# Patient Record
Sex: Female | Born: 1950 | Race: Black or African American | Hispanic: No | Marital: Single | Smoking: Never smoker
Health system: Southern US, Community
[De-identification: ages and names within clinical notes are randomized; demographics above are authoritative.]

## PROBLEM LIST (undated history)

## (undated) DIAGNOSIS — J069 Acute upper respiratory infection, unspecified: Secondary | ICD-10-CM

## (undated) DIAGNOSIS — G629 Polyneuropathy, unspecified: Secondary | ICD-10-CM

## (undated) DIAGNOSIS — I10 Essential (primary) hypertension: Secondary | ICD-10-CM

## (undated) DIAGNOSIS — M76829 Posterior tibial tendinitis, unspecified leg: Secondary | ICD-10-CM

## (undated) DIAGNOSIS — Z889 Allergy status to unspecified drugs, medicaments and biological substances status: Secondary | ICD-10-CM

## (undated) DIAGNOSIS — D179 Benign lipomatous neoplasm, unspecified: Secondary | ICD-10-CM

## (undated) DIAGNOSIS — R011 Cardiac murmur, unspecified: Secondary | ICD-10-CM

## (undated) DIAGNOSIS — E669 Obesity, unspecified: Secondary | ICD-10-CM

## (undated) DIAGNOSIS — Z0000 Encounter for general adult medical examination without abnormal findings: Secondary | ICD-10-CM

## (undated) DIAGNOSIS — K5289 Other specified noninfective gastroenteritis and colitis: Secondary | ICD-10-CM

## (undated) DIAGNOSIS — E11319 Type 2 diabetes mellitus with unspecified diabetic retinopathy without macular edema: Secondary | ICD-10-CM

## (undated) DIAGNOSIS — Z13220 Encounter for screening for lipoid disorders: Secondary | ICD-10-CM

## (undated) DIAGNOSIS — M1990 Unspecified osteoarthritis, unspecified site: Secondary | ICD-10-CM

## (undated) DIAGNOSIS — H409 Unspecified glaucoma: Secondary | ICD-10-CM

## (undated) DIAGNOSIS — Z13820 Encounter for screening for osteoporosis: Secondary | ICD-10-CM

## (undated) DIAGNOSIS — R1032 Left lower quadrant pain: Secondary | ICD-10-CM

## (undated) DIAGNOSIS — E119 Type 2 diabetes mellitus without complications: Secondary | ICD-10-CM

## (undated) DIAGNOSIS — H3530 Unspecified macular degeneration: Secondary | ICD-10-CM

## (undated) DIAGNOSIS — I5189 Other ill-defined heart diseases: Secondary | ICD-10-CM

## (undated) DIAGNOSIS — D126 Benign neoplasm of colon, unspecified: Secondary | ICD-10-CM

## (undated) DIAGNOSIS — Z1231 Encounter for screening mammogram for malignant neoplasm of breast: Secondary | ICD-10-CM

## (undated) HISTORY — DX: Acute upper respiratory infection, unspecified: J06.9

## (undated) HISTORY — DX: Other specified noninfective gastroenteritis and colitis: K52.89

## (undated) HISTORY — DX: Encounter for screening for osteoporosis: Z13.820

## (undated) HISTORY — DX: Left lower quadrant pain: R10.32

## (undated) HISTORY — DX: Encounter for screening for lipoid disorders: Z13.220

## (undated) HISTORY — DX: Cardiac murmur, unspecified: R01.1

## (undated) HISTORY — PX: TOE SURGERY: SHX1073

## (undated) HISTORY — DX: Essential (primary) hypertension: I10

## (undated) HISTORY — DX: Encounter for general adult medical examination without abnormal findings: Z00.00

## (undated) HISTORY — DX: Type 2 diabetes mellitus without complications: E11.9

## (undated) HISTORY — DX: Obesity, unspecified: E66.9

## (undated) HISTORY — DX: Benign lipomatous neoplasm, unspecified: D17.9

## (undated) HISTORY — DX: Posterior tibial tendinitis, unspecified leg: M76.829

## (undated) HISTORY — DX: Encounter for screening mammogram for malignant neoplasm of breast: Z12.31

## (undated) HISTORY — PX: EYE SURGERY: SHX253

---

## 1981-01-30 HISTORY — PX: TOTAL ABDOMINAL HYSTERECTOMY: SHX209

## 2004-01-31 LAB — HM COLONOSCOPY: HM Colonoscopy: NORMAL

## 2004-04-01 ENCOUNTER — Ambulatory Visit: Payer: Self-pay | Admitting: Gastroenterology

## 2004-04-01 HISTORY — PX: COLONOSCOPY: SHX174

## 2005-01-30 ENCOUNTER — Encounter: Payer: Self-pay | Admitting: Family Medicine

## 2005-01-30 LAB — CONVERTED CEMR LAB: Pap Smear: NORMAL

## 2005-03-01 ENCOUNTER — Other Ambulatory Visit: Admission: RE | Admit: 2005-03-01 | Discharge: 2005-03-01 | Payer: Self-pay | Admitting: Internal Medicine

## 2005-04-06 ENCOUNTER — Ambulatory Visit: Payer: Self-pay | Admitting: Internal Medicine

## 2005-04-24 ENCOUNTER — Ambulatory Visit: Payer: Self-pay | Admitting: Internal Medicine

## 2005-06-05 ENCOUNTER — Emergency Department: Payer: Self-pay | Admitting: Emergency Medicine

## 2006-04-23 ENCOUNTER — Ambulatory Visit: Payer: Self-pay | Admitting: Family Medicine

## 2006-04-23 LAB — CONVERTED CEMR LAB
ALT: 14 units/L (ref 0–40)
AST: 17 units/L (ref 0–37)
Alkaline Phosphatase: 69 units/L (ref 39–117)
BUN: 15 mg/dL (ref 6–23)
CO2: 27 meq/L (ref 19–32)
Calcium: 8.8 mg/dL (ref 8.4–10.5)
Chloride: 109 meq/L (ref 96–112)
Creatinine, Ser: 0.6 mg/dL (ref 0.4–1.2)
GFR calc Af Amer: 133 mL/min
Total Bilirubin: 0.7 mg/dL (ref 0.3–1.2)
Total Protein: 7.1 g/dL (ref 6.0–8.3)

## 2006-05-02 ENCOUNTER — Ambulatory Visit: Payer: Self-pay | Admitting: Family Medicine

## 2006-05-29 ENCOUNTER — Encounter: Payer: Self-pay | Admitting: Family Medicine

## 2006-05-29 ENCOUNTER — Ambulatory Visit: Payer: Self-pay | Admitting: Family Medicine

## 2006-05-29 DIAGNOSIS — Z6831 Body mass index (BMI) 31.0-31.9, adult: Secondary | ICD-10-CM | POA: Insufficient documentation

## 2006-05-29 DIAGNOSIS — I152 Hypertension secondary to endocrine disorders: Secondary | ICD-10-CM | POA: Insufficient documentation

## 2006-05-29 DIAGNOSIS — E1159 Type 2 diabetes mellitus with other circulatory complications: Secondary | ICD-10-CM | POA: Insufficient documentation

## 2006-05-29 DIAGNOSIS — E663 Overweight: Secondary | ICD-10-CM | POA: Insufficient documentation

## 2006-05-29 DIAGNOSIS — I10 Essential (primary) hypertension: Secondary | ICD-10-CM | POA: Insufficient documentation

## 2006-05-29 DIAGNOSIS — Z6832 Body mass index (BMI) 32.0-32.9, adult: Secondary | ICD-10-CM

## 2006-06-26 ENCOUNTER — Ambulatory Visit: Payer: Self-pay | Admitting: Family Medicine

## 2006-08-15 ENCOUNTER — Ambulatory Visit: Payer: Self-pay | Admitting: Family Medicine

## 2006-08-15 ENCOUNTER — Encounter: Payer: Self-pay | Admitting: Family Medicine

## 2006-12-25 ENCOUNTER — Ambulatory Visit: Payer: Self-pay | Admitting: Family Medicine

## 2006-12-25 LAB — CONVERTED CEMR LAB
Blood in Urine, dipstick: NEGATIVE
Nitrite: NEGATIVE
Specific Gravity, Urine: 1.025

## 2006-12-26 LAB — CONVERTED CEMR LAB
Basophils Absolute: 0 10*3/uL (ref 0.0–0.1)
Basophils Relative: 0.1 % (ref 0.0–1.0)
HCT: 42.5 % (ref 36.0–46.0)
Hemoglobin: 14.7 g/dL (ref 12.0–15.0)
MCHC: 34.5 g/dL (ref 30.0–36.0)
Monocytes Absolute: 0.5 10*3/uL (ref 0.2–0.7)
Neutrophils Relative %: 59.6 % (ref 43.0–77.0)
RDW: 11.9 % (ref 11.5–14.6)

## 2007-03-12 ENCOUNTER — Ambulatory Visit: Payer: Self-pay | Admitting: Family Medicine

## 2007-07-10 ENCOUNTER — Ambulatory Visit: Payer: Self-pay | Admitting: Family Medicine

## 2007-08-01 ENCOUNTER — Ambulatory Visit: Payer: Self-pay | Admitting: Family Medicine

## 2007-08-15 ENCOUNTER — Telehealth: Payer: Self-pay | Admitting: Family Medicine

## 2007-08-22 ENCOUNTER — Ambulatory Visit: Payer: Self-pay | Admitting: Family Medicine

## 2007-09-03 ENCOUNTER — Ambulatory Visit: Payer: Self-pay | Admitting: Family Medicine

## 2007-09-04 ENCOUNTER — Ambulatory Visit: Payer: Self-pay | Admitting: Family Medicine

## 2007-09-04 ENCOUNTER — Encounter: Payer: Self-pay | Admitting: Family Medicine

## 2007-09-05 ENCOUNTER — Ambulatory Visit: Payer: Self-pay | Admitting: Family Medicine

## 2007-09-05 DIAGNOSIS — E1149 Type 2 diabetes mellitus with other diabetic neurological complication: Secondary | ICD-10-CM | POA: Insufficient documentation

## 2007-09-05 DIAGNOSIS — E1349 Other specified diabetes mellitus with other diabetic neurological complication: Secondary | ICD-10-CM

## 2007-09-05 DIAGNOSIS — E1365 Other specified diabetes mellitus with hyperglycemia: Secondary | ICD-10-CM

## 2007-09-09 LAB — CONVERTED CEMR LAB: Hgb A1c MFr Bld: 11.9 % — ABNORMAL HIGH (ref 4.6–6.0)

## 2007-09-11 LAB — CONVERTED CEMR LAB
Albumin: 3.7 g/dL (ref 3.5–5.2)
BUN: 16 mg/dL (ref 6–23)
Calcium: 9.3 mg/dL (ref 8.4–10.5)
Cholesterol: 143 mg/dL (ref 0–200)
Creatinine, Ser: 0.7 mg/dL (ref 0.4–1.2)
GFR calc Af Amer: 111 mL/min
Glucose, Bld: 306 mg/dL — ABNORMAL HIGH (ref 70–99)
HDL: 37.3 mg/dL — ABNORMAL LOW (ref >39.0)
Total Protein: 7.4 g/dL (ref 6.0–8.3)
Triglycerides: 101 mg/dL (ref 0–149)
VLDL: 20 mg/dL (ref 0–40)

## 2007-09-19 ENCOUNTER — Telehealth: Payer: Self-pay | Admitting: Family Medicine

## 2007-09-20 ENCOUNTER — Ambulatory Visit: Payer: Self-pay | Admitting: Family Medicine

## 2007-09-20 ENCOUNTER — Encounter: Payer: Self-pay | Admitting: Family Medicine

## 2007-09-23 ENCOUNTER — Telehealth: Payer: Self-pay | Admitting: Family Medicine

## 2007-10-01 ENCOUNTER — Ambulatory Visit: Payer: Self-pay | Admitting: Family Medicine

## 2007-10-31 ENCOUNTER — Ambulatory Visit: Payer: Self-pay | Admitting: Family Medicine

## 2007-11-04 ENCOUNTER — Encounter: Payer: Self-pay | Admitting: Family Medicine

## 2007-11-14 ENCOUNTER — Telehealth (INDEPENDENT_AMBULATORY_CARE_PROVIDER_SITE_OTHER): Payer: Self-pay | Admitting: *Deleted

## 2007-11-15 ENCOUNTER — Ambulatory Visit: Payer: Self-pay | Admitting: Family Medicine

## 2007-12-02 ENCOUNTER — Ambulatory Visit: Payer: Self-pay | Admitting: Family Medicine

## 2007-12-10 ENCOUNTER — Ambulatory Visit: Payer: Self-pay | Admitting: Family Medicine

## 2008-01-31 HISTORY — PX: LIPOMA EXCISION: SHX5283

## 2008-02-12 ENCOUNTER — Ambulatory Visit: Payer: Self-pay | Admitting: Family Medicine

## 2008-03-09 ENCOUNTER — Ambulatory Visit: Payer: Self-pay | Admitting: Family Medicine

## 2008-03-10 LAB — CONVERTED CEMR LAB
ALT: 12 units/L (ref 0–35)
AST: 16 units/L (ref 0–37)
Alkaline Phosphatase: 74 units/L (ref 39–117)
BUN: 15 mg/dL (ref 6–23)
CO2: 28 meq/L (ref 19–32)
Chloride: 103 meq/L (ref 96–112)
Cholesterol: 125 mg/dL (ref 0–200)
Creatinine, Ser: 0.7 mg/dL (ref 0.4–1.2)
Hgb A1c MFr Bld: 6.8 % — ABNORMAL HIGH (ref 4.6–6.0)
Potassium: 3.9 meq/L (ref 3.5–5.1)
Total Bilirubin: 0.8 mg/dL (ref 0.3–1.2)
Total CHOL/HDL Ratio: 2.6
Triglycerides: 41 mg/dL (ref 0–149)

## 2008-03-16 ENCOUNTER — Ambulatory Visit: Payer: Self-pay | Admitting: Family Medicine

## 2008-03-19 ENCOUNTER — Ambulatory Visit: Payer: Self-pay | Admitting: Specialist

## 2008-06-03 ENCOUNTER — Ambulatory Visit: Payer: Self-pay | Admitting: Family Medicine

## 2008-06-09 ENCOUNTER — Ambulatory Visit: Payer: Self-pay | Admitting: Family Medicine

## 2008-06-09 DIAGNOSIS — J301 Allergic rhinitis due to pollen: Secondary | ICD-10-CM

## 2008-06-11 ENCOUNTER — Ambulatory Visit: Payer: Self-pay | Admitting: Specialist

## 2008-06-11 ENCOUNTER — Ambulatory Visit: Payer: Self-pay | Admitting: Cardiology

## 2008-06-17 ENCOUNTER — Ambulatory Visit: Payer: Self-pay | Admitting: Specialist

## 2008-07-06 ENCOUNTER — Telehealth (INDEPENDENT_AMBULATORY_CARE_PROVIDER_SITE_OTHER): Payer: Self-pay | Admitting: *Deleted

## 2008-08-28 ENCOUNTER — Ambulatory Visit (HOSPITAL_COMMUNITY): Admission: RE | Admit: 2008-08-28 | Discharge: 2008-08-28 | Payer: Self-pay | Admitting: Family Medicine

## 2008-08-28 ENCOUNTER — Ambulatory Visit: Payer: Self-pay | Admitting: Family Medicine

## 2008-08-28 LAB — CONVERTED CEMR LAB
Ketones, urine, test strip: NEGATIVE
Nitrite: NEGATIVE
Urobilinogen, UA: 0.2
pH: 6

## 2008-08-31 ENCOUNTER — Telehealth (INDEPENDENT_AMBULATORY_CARE_PROVIDER_SITE_OTHER): Payer: Self-pay | Admitting: *Deleted

## 2008-08-31 LAB — CONVERTED CEMR LAB
BUN: 7 mg/dL (ref 6–23)
Basophils Relative: 0 % (ref 0–1)
CO2: 30 meq/L (ref 19–32)
Calcium: 9.5 mg/dL (ref 8.4–10.5)
Chloride: 105 meq/L (ref 96–112)
Creatinine, Ser: 0.72 mg/dL (ref 0.40–1.20)
Eosinophils Absolute: 0.1 10*3/uL (ref 0.0–0.7)
Eosinophils Relative: 1 % (ref 0–5)
Glucose, Bld: 123 mg/dL — ABNORMAL HIGH (ref 70–99)
HCT: 42.6 % (ref 36.0–46.0)
Hemoglobin: 14.6 g/dL (ref 12.0–15.0)
Lipase: 19 units/L (ref 11–59)
Lymphs Abs: 2.8 10*3/uL (ref 0.7–4.0)
MCHC: 34.2 g/dL (ref 30.0–36.0)
MCV: 94.8 fL (ref 78.0–100.0)
Monocytes Absolute: 0.5 10*3/uL (ref 0.1–1.0)
Monocytes Relative: 6 % (ref 3–12)
Neutrophils Relative %: 57 % (ref 43–77)
RBC: 4.5 M/uL (ref 3.87–5.11)
Total Bilirubin: 0.8 mg/dL (ref 0.3–1.2)

## 2008-09-01 ENCOUNTER — Ambulatory Visit: Payer: Self-pay | Admitting: Family Medicine

## 2008-09-01 LAB — CONVERTED CEMR LAB
Blood in Urine, dipstick: NEGATIVE
Glucose, Urine, Semiquant: NEGATIVE
Ketones, urine, test strip: NEGATIVE
Nitrite: NEGATIVE
pH: 6

## 2008-09-02 ENCOUNTER — Encounter: Payer: Self-pay | Admitting: Family Medicine

## 2008-09-15 ENCOUNTER — Ambulatory Visit: Payer: Self-pay | Admitting: Family Medicine

## 2008-09-16 ENCOUNTER — Ambulatory Visit: Payer: Self-pay | Admitting: Family Medicine

## 2008-09-16 LAB — CONVERTED CEMR LAB
Alkaline Phosphatase: 89 units/L (ref 39–117)
Bilirubin, Direct: 0.2 mg/dL (ref 0.0–0.3)
CO2: 31 meq/L (ref 19–32)
Calcium: 8.7 mg/dL (ref 8.4–10.5)
Creatinine, Ser: 0.6 mg/dL (ref 0.4–1.2)
Creatinine,U: 106.4 mg/dL
HDL: 44.5 mg/dL (ref >39.00)
Hgb A1c MFr Bld: 8.6 % — ABNORMAL HIGH (ref 4.6–6.5)
Microalb Creat Ratio: 1.9 mg/g (ref 0.0–30.0)
Sodium: 142 meq/L (ref 135–145)
Total Bilirubin: 0.9 mg/dL (ref 0.3–1.2)
Total CHOL/HDL Ratio: 3
Triglycerides: 46 mg/dL (ref 0.0–149.0)

## 2008-09-16 LAB — HM PAP SMEAR

## 2008-09-23 ENCOUNTER — Encounter: Payer: Self-pay | Admitting: Family Medicine

## 2008-09-23 ENCOUNTER — Ambulatory Visit: Payer: Self-pay | Admitting: Family Medicine

## 2008-09-28 ENCOUNTER — Emergency Department: Payer: Self-pay | Admitting: Emergency Medicine

## 2008-09-29 ENCOUNTER — Ambulatory Visit: Payer: Self-pay | Admitting: Family Medicine

## 2008-10-13 ENCOUNTER — Encounter: Payer: Self-pay | Admitting: Family Medicine

## 2008-10-14 ENCOUNTER — Encounter (INDEPENDENT_AMBULATORY_CARE_PROVIDER_SITE_OTHER): Payer: Self-pay | Admitting: *Deleted

## 2008-12-28 ENCOUNTER — Ambulatory Visit: Payer: Self-pay | Admitting: Family Medicine

## 2008-12-29 LAB — CONVERTED CEMR LAB: Hgb A1c MFr Bld: 10.4 % — ABNORMAL HIGH (ref 4.6–6.5)

## 2008-12-31 ENCOUNTER — Ambulatory Visit: Payer: Self-pay | Admitting: Family Medicine

## 2008-12-31 LAB — CONVERTED CEMR LAB
Bilirubin Urine: NEGATIVE
Blood in Urine, dipstick: NEGATIVE
Glucose, Urine, Semiquant: 100
KOH Prep: NEGATIVE
Nitrite: NEGATIVE
Protein, U semiquant: NEGATIVE
Urobilinogen, UA: NEGATIVE
WBC Urine, dipstick: NEGATIVE

## 2009-01-15 ENCOUNTER — Ambulatory Visit: Payer: Self-pay | Admitting: Family Medicine

## 2009-01-15 LAB — CONVERTED CEMR LAB
Blood in Urine, dipstick: NEGATIVE
KOH Prep: NEGATIVE
Nitrite: NEGATIVE
Urobilinogen, UA: 0.2

## 2009-02-10 ENCOUNTER — Telehealth: Payer: Self-pay | Admitting: Family Medicine

## 2009-02-15 ENCOUNTER — Encounter: Payer: Self-pay | Admitting: Family Medicine

## 2009-02-15 ENCOUNTER — Ambulatory Visit: Payer: Self-pay | Admitting: Family Medicine

## 2009-02-24 ENCOUNTER — Encounter: Payer: Self-pay | Admitting: Family Medicine

## 2009-03-02 ENCOUNTER — Ambulatory Visit: Payer: Self-pay | Admitting: Family Medicine

## 2009-03-18 ENCOUNTER — Encounter (INDEPENDENT_AMBULATORY_CARE_PROVIDER_SITE_OTHER): Payer: Self-pay | Admitting: *Deleted

## 2009-03-26 ENCOUNTER — Encounter: Payer: Self-pay | Admitting: Family Medicine

## 2009-04-26 ENCOUNTER — Ambulatory Visit: Payer: Self-pay | Admitting: Family Medicine

## 2009-04-28 ENCOUNTER — Ambulatory Visit: Payer: Self-pay | Admitting: Family Medicine

## 2009-04-28 LAB — CONVERTED CEMR LAB
ALT: 15 units/L (ref 0–35)
Albumin: 3.5 g/dL (ref 3.5–5.2)
CO2: 30 meq/L (ref 19–32)
GFR calc non Af Amer: 110.28 mL/min (ref >60)
Glucose, Bld: 259 mg/dL — ABNORMAL HIGH (ref 70–99)
HDL: 46.6 mg/dL (ref >39.00)
Hgb A1c MFr Bld: 9.1 % — ABNORMAL HIGH (ref 4.6–6.5)
Potassium: 4.3 meq/L (ref 3.5–5.1)
Sodium: 139 meq/L (ref 135–145)
Total Bilirubin: 0.6 mg/dL (ref 0.3–1.2)
Total CHOL/HDL Ratio: 3
Triglycerides: 84 mg/dL (ref 0.0–149.0)
VLDL: 16.8 mg/dL (ref 0.0–40.0)

## 2009-05-11 ENCOUNTER — Telehealth: Payer: Self-pay | Admitting: Family Medicine

## 2009-05-28 ENCOUNTER — Ambulatory Visit: Payer: Self-pay | Admitting: Physician Assistant

## 2009-09-13 ENCOUNTER — Ambulatory Visit: Payer: Self-pay | Admitting: Family Medicine

## 2009-09-13 LAB — CONVERTED CEMR LAB: Hgb A1c MFr Bld: 11.3 % — ABNORMAL HIGH (ref 4.6–6.5)

## 2009-09-15 ENCOUNTER — Ambulatory Visit: Payer: Self-pay | Admitting: Family Medicine

## 2009-09-22 ENCOUNTER — Ambulatory Visit: Payer: Self-pay | Admitting: Family Medicine

## 2009-09-29 ENCOUNTER — Ambulatory Visit: Payer: Self-pay | Admitting: Family Medicine

## 2009-10-26 ENCOUNTER — Encounter: Payer: Self-pay | Admitting: Family Medicine

## 2009-11-26 ENCOUNTER — Telehealth: Payer: Self-pay | Admitting: Family Medicine

## 2009-12-15 ENCOUNTER — Encounter: Payer: Self-pay | Admitting: Family Medicine

## 2009-12-15 ENCOUNTER — Ambulatory Visit: Payer: Self-pay | Admitting: Family Medicine

## 2010-01-10 ENCOUNTER — Encounter (INDEPENDENT_AMBULATORY_CARE_PROVIDER_SITE_OTHER): Payer: Self-pay | Admitting: *Deleted

## 2010-01-17 ENCOUNTER — Ambulatory Visit: Payer: Self-pay | Admitting: Family Medicine

## 2010-03-01 NOTE — Assessment & Plan Note (Signed)
SummaryArdith Horne BP/DLO  Nurse Visit   Vital Signs:  Patient profile:   60 year old female Weight:      184.6 pounds BMI:     33.35 Temp:     98.3 degrees F oral Pulse rate:   80 / minute Pulse rhythm:   regular BP sitting:   150 / 90  (left arm) Cuff size:   regular  Vitals Entered By: Benny Lennert CMA Duncan Dull) (September 22, 2009 10:21 AM)  History of Present Illness: prior notes reviewed.  Blood pressure remains elevated: Patient has been noncompliant in the past, she is currently on Norvasc 10 mg.  Acute anxiety, patient feels more stable, and able to return to work at this time.  Noncompliant, elevated blood sugars, she has been compliant over the last week with her blood sugar medications.  Review of systems as above. No fever, chills, sweats. Some fatigue.  GEN: Well-developed,well-nourished,in no acute distress; alert,appropriate and cooperative throughout examination HEENT: Normocephalic and atraumatic without obvious abnormalities. No apparent alopecia or balding. Ears, externally no deformities PULM: Breathing comfortably in no respiratory distress EXT: No clubbing, cyanosis, or edema PSYCH: Normally interactive. Cooperative during the interview. Pleasant. Friendly and conversant. Not anxious or depressed appearing. Normal, full affect.    Impression & Recommendations:  Problem # 1:  HYPERTENSION (ICD-401.9) Assessment Deteriorated add HCTZ  Her updated medication list for this problem includes:    Amlodipine Besylate 10 Mg Tabs (Amlodipine besylate) .Marland Kitchen... Take 1 tablet by mouth once a day    Hydrochlorothiazide 12.5 Mg Tabs (Hydrochlorothiazide) .Marland Kitchen... Take 1 tab  by mouth every morning  BP today: 150/90 Prior BP: 120/78 (04/28/2009)  Prior 10 Yr Risk Heart Disease: 15 % (04/28/2009)  Labs Reviewed: K+: 4.3 (04/26/2009) Creat: : 0.7 (04/26/2009)   Chol: 130 (04/26/2009)   HDL: 46.60 (04/26/2009)   LDL: 67 (04/26/2009)   TG: 84.0  (04/26/2009)  Problem # 2:  OTHER ACUTE REACTIONS TO STRESS (ICD-308.3) acute anxiety, clear to return to work.  Problem # 3:  DM (ICD-250.00) not stable, noncompliant, f/u closely  Her updated medication list for this problem includes:    Metformin Hcl 500 Mg Xr24h-tab (Metformin hcl) .Marland Kitchen... 2 tab by mouth daily    Victoza 18 Mg/52ml Soln (Liraglutide) .Marland Kitchen... 0.6 mg subcutaneously daily  x 1 week then increase to 1.2 mg subcutaneously daily  Complete Medication List: 1)  Amlodipine Besylate 10 Mg Tabs (Amlodipine besylate) .... Take 1 tablet by mouth once a day 2)  Metformin Hcl 500 Mg Xr24h-tab (Metformin hcl) .... 2 tab by mouth daily 3)  Victoza 18 Mg/27ml Soln (Liraglutide) .... 0.6 mg subcutaneously daily  x 1 week then increase to 1.2 mg subcutaneously daily 4)  Hydrochlorothiazide 12.5 Mg Tabs (Hydrochlorothiazide) .... Take 1 tab  by mouth every morning    Allergies: No Known Drug Allergies  Orders Added: 1)  Est. Patient Level III [10272] Prescriptions: HYDROCHLOROTHIAZIDE 12.5 MG  TABS (HYDROCHLOROTHIAZIDE) Take 1 tab  by mouth every morning  #30 x 5   Entered and Authorized by:   Hannah Beat MD   Signed by:   Hannah Beat MD on 09/22/2009   Method used:   [facility]        [facility] [phone]* (retail)       [address] [place], [place]  [address]       Ph: [phone] or [phone]  Fax: [phone]   RxID:   [id number]

## 2010-03-01 NOTE — Letter (Signed)
Summary: [place] No Show Letter  [place] at [facility]  [address] [place], [place] [address]   Phone: [phone]  Fax: [phone]    03/18/2009 MRN: [id number]  Patricia Horne [address] [place], [place]  [address]   Dear [name],   Our records indicate that you missed your scheduled appointment with _____lab________________ on __2.17.11__________.  Please contact this office to reschedule your appointment as soon as possible.  It is important that you keep your scheduled appointments with your physician, so we can provide you the best care possible.  Please be advised that there may be a charge for "no show" appointments.    Sincerely,   [place] [facility]

## 2010-03-01 NOTE — Letter (Signed)
Summary: Patient No Show/[facility]  Patient No Show/[facility]   Imported By: Lanelle Bal 04/01/2009 11:43:28  _____________________________________________________________________  External Attachment:    Type:   Image     Comment:   External Document

## 2010-03-01 NOTE — Assessment & Plan Note (Signed)
Summary: 3 M F/U DLO   Vital Signs:  Patient profile:   60 year old female Height:      62.5 inches Weight:      193.2 pounds BMI:     34.90 Temp:     98.3 degrees F oral Pulse rate:   80 / minute Pulse rhythm:   regular BP sitting:   120 / 78  (left arm) Cuff size:   regular  Vitals Entered By: Benny Lennert CMA Duncan Dull) (April 28, 2009 11:06 AM)  History of Present Illness: Chief complaint 3 month follow up  DM, poor control ..remains noncompliant with medication... not taking Byetta or metformin in last week. Fredna Dow does not take pills because too big and diarrhea. Does not like taking injection, but if small   Went to one nutrition visit. Cholesterol at goal on no medicaiton.   3 lb weight loss.   Hypertension History:      She denies headache, chest pain, palpitations, dyspnea with exertion, peripheral edema, syncope, and side effects from treatment.  Well controlled at home on amlodipine.        Positive major cardiovascular risk factors include female age 60 years old or older, diabetes, and hypertension.  Negative major cardiovascular risk factors include non-tobacco-user status.     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 Take 1 Tablet By Mouth Once A  Day 2)  Metformin Hcl 500 Mg  Xr24h-Tab (Metformin Hcl) .... 2 Tab By Mouth Daily 3)  Vaniqa 13.9 % Crea (Eflornithine Hcl) .... Apply To Affected Area Two Times A Day 4)  Byetta 10 Mcg Pen 10 Mcg/0.33ml Soln (Exenatide) .Marland Kitchen.. 1 Inj  One To Two Times A Day 5)  Losartan Potassium 25 Mg Tabs (Losartan Potassium) .Marland Kitchen.. 1 Tab By Mouth Daily  Allergies (verified): No Known Drug Allergies  Past History:  Past medical, surgical, family and social histories (including risk factors) reviewed, and no changes noted (except as noted below).  Past Medical History: Reviewed history from 02/12/2008 and no changes required. Current Problems:  GASTROENTERITIS, ACUTE (ICD-558.9) DM (ICD-250.00) SCREENING FOR LIPOID DISORDERS (ICD-V77.91) SPECIAL SCREENING FOR OSTEOPOROSIS (ICD-V82.81) OTHER SCREENING MAMMOGRAM (ICD-V76.12) URI (ICD-465.9) TIBIALIS TENDINITIS (ICD-726.72) ABDOMINAL PAIN, LEFT LOWER QUADRANT (ICD-789.04) HEALTH SCREENING (ICD-V70.0) OBESITY (ICD-278.00) LIPOMA, LEFT ARM (ICD-214.9) HYPERTENSION (ICD-401.9) CARDIAC MURMUR (ICD-785.2)    Past Surgical History: Reviewed history from 05/29/2006 and no changes required. 1983 ABDOMINAL HYSTERECTOMY, ONE OVARY REMOVED, LATER OTHER OVARY REMOVED 1990's BUNION, HAMMER-TOE REPAIR 2000?  STRESS TEST (-)  Family History: Reviewed history from 05/29/2006 and no changes required. Father: ALIVE 62 ARTHRITIS Mother: ALIVE 22 DM Siblings: 2 BROTHERS ONE WITH TYPE I DM, OTHER HEALTHY NO SISTERS NO MI <65  Social History: Reviewed history from 05/29/2006 and no changes required. 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 ENT:  pink eye over weekend, improving. On cipro drops in eye. . CV:  Denies chest pain or discomfort. Resp:  Denies shortness of breath. GI:  Denies abdominal  pain and bloody stools. GU:  Denies dysuria.  Physical Exam  General:  Well-developed,well-nourished,in no acute distress; alert,appropriate and cooperative throughout examination Mouth:  MMM Neck:  no carotid bruit or thyromegaly no cervical or supraclavicular lymphadenopathy  Lungs:  Normal respiratory effort, chest expands symmetrically. Lungs are clear to auscultation, no crackles or wheezes. Heart:  Normal rate and regular rhythm. S1 and S2 normal without gallop, murmur, click, rub or other extra sounds. Abdomen:  Bowel sounds positive,abdomen soft and non-tender without masses, organomegaly or hernias noted. Pulses:  R and L posterior tibial pulses are full and equal bilaterally  Extremities:  no edema  Diabetes Management Exam:    Foot Exam (with socks and/or shoes not present):       Sensory-Pinprick/Light touch:          Left medial foot (L-4): normal          Left dorsal foot (L-5): normal          Left lateral foot (S-1): normal          Right medial foot (L-4): normal          Right dorsal foot (L-5): normal          Right lateral foot (S-1): normal       Sensory-Monofilament:          Left foot: normal          Right foot: normal       Inspection:          Left foot: normal          Right foot: normal       Nails:          Left foot: normal          Right foot: normal   Impression & Recommendations:  Problem # 1:  DM (ICD-250.00)  Very noncomplaiant. She feels she would take Byetta if she could just take once daily..so we will increase dose but use once daily. Also return to metformin 2 tabs daily ..she feels she can take this dose..refuses actos, insulin, cannot afford victoza.  JAnuvia no SE but didn't like all the pills.  Her updated medication list for this problem includes:    Metformin Hcl 500 Mg Xr24h-tab (Metformin hcl) .Marland Kitchen... 2 tab by mouth daily    Byetta 10 Mcg Pen 10 Mcg/0.20ml Soln (Exenatide) .Marland Kitchen... 1 inj  one to two times a day    Losartan Potassium  25 Mg Tabs (Losartan potassium) .Marland Kitchen... 1 tab by mouth daily  Labs Reviewed: Creat: 0.6 (09/15/2008)     Last Eye Exam: normal (08/31/2007) Reviewed HgBA1c results: 10.4 (12/28/2008)  8.6 (09/15/2008)  Problem # 2:  HYPERTENSION (ICD-401.9)  Well controlled. Continue current medication.  Her updated medication list for this problem includes:    Amlodipine Besylate 10 Mg Tabs (Amlodipine besylate) .Marland Kitchen... Take 1 tablet by mouth once a day    Losartan Potassium 25 Mg Tabs (Losartan potassium) .Marland Kitchen... 1 tab by mouth daily  BP today: 120/78 Prior BP: 132/90 (01/15/2009)  10 Yr Risk Heart Disease: 15 % Prior 10 Yr Risk Heart Disease: 20 % (09/16/2008)  Labs  Reviewed: K+: 3.9 (09/15/2008) Creat: : 0.6 (09/15/2008)   Chol: 122 (09/15/2008)   HDL: 44.50 (09/15/2008)   LDL: 68 (09/15/2008)   TG: 46.0 (09/15/2008)  Complete Medication List: 1)  Amlodipine Besylate 10 Mg Tabs (Amlodipine besylate) .... Take 1 tablet by mouth once a day 2)  Metformin Hcl 500 Mg Xr24h-tab (Metformin hcl) .... 2 tab by mouth daily 3)  Vaniqa 13.9 % Crea (Eflornithine hcl) .... Apply to affected area two times a day 4)  Byetta 10 Mcg Pen 10 Mcg/0.36ml Soln (Exenatide) .Marland Kitchen.. 1 inj  one to two times a day 5)  Losartan Potassium 25 Mg Tabs (Losartan potassium) .Marland Kitchen.. 1 tab by mouth daily  Hypertension Assessment/Plan:      The patient's hypertensive risk group is category C: Target organ damage and/or diabetes.  Her calculated 10 year risk of coronary heart disease is 15 %.  Today's blood pressure is 120/78.  Her blood pressure goal is < 130/80.  Patient Instructions: 1)  Please schedule a follow-up appointment in 3 months .  2)  HgBA1c prior to visit  ICD-9: 250.00 Prescriptions: AMLODIPINE BESYLATE 10 MG  TABS (AMLODIPINE BESYLATE) Take 1 tablet by mouth once a day  #30 x 11   Entered and Authorized by:   Kerby Nora MD   Signed by:   Kerby Nora MD on 04/28/2009   Method used:   [facility]        [facility] [phone][facility] (retail)       [address] [place], [place]  [address]       Ph: [phone] or [phone]       Fax: [phone]   RxID:   [id number] METFORMIN HCL 500 MG  XR24H-TAB (METFORMIN HCL) 2 tab by mouth daily  #60 Tablet x 11   Entered and Authorized by:   Kerby Nora MD   Signed by:   Kerby Nora MD on 04/28/2009   Method used:   [facility] to        [facility] [phone]* (retail)       [address] [place], [place]  [address]       Ph: [phone] or [phone]       Fax: [phone]   RxID:   [id number] BYETTA 10 MCG PEN 10 MCG/0.04ML SOLN (EXENATIDE) 1 inj  one to two times a day  #1 box x 11   Entered and Authorized by:   Kerby Nora MD   Signed by:   Kerby Nora MD on 04/28/2009   Method used:   [facility] to        [facility] [phone]* (retail)       [address] [place], [place]  [address]       Ph: [phone] or [phone]       Fax: [phone]   RxID:   [id number]   Current Allergies (reviewed today): No known allergies

## 2010-03-01 NOTE — Progress Notes (Signed)
Summary: stomach pain  Phone Note Call from Patient Call back at Home Phone [phone]   Caller: Patient Call For: Kerby Nora MD/ Dr. Patsy Lager Summary of Call: Patient called and said that she was put on  Metronidazole 500 Mg Tabs for the stomach problems she was having on last visit. She said that she finished medication some time around the 31st of Dec. and that while she was taking it she was feeling much better. She said that the last two days she has had alot pain in her stomach and it feels the way that it felt before taking medication. She wants to know if she needs to be seen again or if she needs another round of the medication, or even a different medication. Please advise. [facility] [address]  Initial call taken by: Melody Comas,  February 10, 2009 2:00 PM  Follow-up for Phone Call        follow-up with Dr. Ermalene Searing to reassess Follow-up by: Hannah Beat MD,  February 10, 2009 2:03 PM  Additional Follow-up for Phone Call Additional follow up Details #1::        Patient advised as instructed.  Offered her several appointment dates and times with Dr. 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  She said she did drink some water and has been moving around and that has helped.  Will call us back if it gets worse. Additional Follow-up by: Linde Gillis CMA Duncan Dull),  February 10, 2009 3:49 PM

## 2010-03-01 NOTE — Assessment & Plan Note (Signed)
Summary: 3 M F/U DLO   Vital Signs:  Patient profile:   60 year old female Height:      62.5 inches Weight:      187.4 pounds BMI:     33.85 Temp:     98.7 degrees F oral Pulse rate:   80 / minute Pulse rhythm:   regular BP sitting:   150 / 100  (left arm) Cuff size:   regular  Vitals Entered By: Benny Lennert CMA Duncan Dull) (September 15, 2009 12:26 PM)  History of Present Illness: Chief complaint 3 month follow up   DM, poor control .Marland KitchenWorsened...stopped taking all her medication in last week. Never started victoza.  6 lb more  weight loss.   HTN, poor control..stopped her medicaiton.  At home 140/88-90     Increased stress lately.. mother ill, trouble at work in last week... Tearful at today's appt. Denies overall depression, SI. Situational anxiety. No insomnia. Poor energy, moderate conceration. No panic attack. Having stomach pain. Refuses counsleor at this time.   Allergies (verified): No Known Drug Allergies  Past History:  Past medical, surgical, family and social histories (including risk factors) reviewed, and no changes noted (except as noted below).  Past Medical History: Reviewed history from 02/12/2008 and no changes required. Current Problems:  GASTROENTERITIS, ACUTE (ICD-558.9) DM (ICD-250.00) SCREENING FOR LIPOID DISORDERS (ICD-V77.91) SPECIAL SCREENING FOR OSTEOPOROSIS (ICD-V82.81) OTHER SCREENING MAMMOGRAM (ICD-V76.12) URI (ICD-465.9) TIBIALIS TENDINITIS (ICD-726.72) ABDOMINAL PAIN, LEFT LOWER QUADRANT (ICD-789.04) HEALTH SCREENING (ICD-V70.0) OBESITY (ICD-278.00) LIPOMA, LEFT ARM (ICD-214.9) HYPERTENSION (ICD-401.9) CARDIAC MURMUR (ICD-785.2)    Past Surgical History: Reviewed history from 05/29/2006 and no changes required. 1983 ABDOMINAL HYSTERECTOMY, ONE OVARY REMOVED, LATER OTHER OVARY REMOVED 1990's BUNION, HAMMER-TOE REPAIR 2000?  STRESS TEST (-)  Family History: Reviewed history from 05/29/2006 and no changes  required. Father: ALIVE 82 ARTHRITIS Mother: ALIVE 2 DM Siblings: 2 BROTHERS ONE WITH TYPE I DM, OTHER HEALTHY NO SISTERS NO MI <65  Social History: Reviewed history from 05/29/2006 and no changes required. 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 CV:  Denies chest pain or discomfort. Resp:  Denies shortness of breath. GI:  Denies abdominal pain. GU:  Denies dysuria.  Physical Exam  General:  overwieght appearing female in moderate distress Mouth:  MMM Neck:  no carotid bruit or thyromegaly no cervical or supraclavicular lymphadenopathy  Lungs:  Normal respiratory effort, chest expands symmetrically. Lungs are clear to auscultation, no crackles or wheezes. Heart:  Normal rate and regular rhythm. S1 and S2 normal without gallop, murmur, click, rub or other extra sounds. Pulses:  R and L posterior tibial pulses are full and equal bilaterally  Extremities:  no edema Skin:  Intact without suspicious lesions or rashes Psych:  Oriented X3, memory intact for recent and remote, withdrawn, poor eye contact, tearful, and moderately anxious.    Diabetes Management Exam:    Foot Exam (with socks and/or shoes not present):       Sensory-Pinprick/Light touch:          Left medial foot (L-4): normal          Left dorsal foot (L-5): normal          Left lateral foot (S-1): normal          Right medial foot (L-4): normal          Right  dorsal foot (L-5): normal          Right lateral foot (S-1): normal       Sensory-Monofilament:          Left foot: normal          Right foot: normal       Inspection:          Left foot: normal          Right foot: normal       Nails:          Left foot: normal          Right foot: normal   Impression & Recommendations:  Problem # 1:  OTHER ACUTE REACTIONS TO STRESS  (ICD-308.3) Inability to function at work given stress. Anxiety at home and in work situations.  Connot concentration at work, decrease motivation, energy. Some anhedonia, Recommend stress reduction, relaxation and exercsie. Will remove from work until next OV in 1 week.  Total visit time > 50% spent counseling and cordinating patients care   Problem # 2:  DM (ICD-250.00) Very poor control. Pt is noncompliant with meds. Encourage her to restart meds.  The following medications were removed from the medication list:    Losartan Potassium 25 Mg Tabs (Losartan potassium) .Marland Kitchen... 1 tab by mouth daily Her updated medication list for this problem includes:    Metformin Hcl 500 Mg Xr24h-tab (Metformin hcl) .Marland Kitchen... 2 tab by mouth daily    Victoza 18 Mg/66ml Soln (Liraglutide) .Marland Kitchen... 0.6 mg subcutaneously daily  x 1 week then increase to 1.2 mg subcutaneously daily  Problem # 3:  HYPERTENSION (ICD-401.9) Restart meds.  The following medications were removed from the medication list:    Losartan Potassium 25 Mg Tabs (Losartan potassium) .Marland Kitchen... 1 tab by mouth daily Her updated medication list for this problem includes:    Amlodipine Besylate 10 Mg Tabs (Amlodipine besylate) .Marland Kitchen... Take 1 tablet by mouth once a day    Hydrochlorothiazide 12.5 Mg Tabs (Hydrochlorothiazide) .Marland Kitchen... Take 1 tab  by mouth every morning  Complete Medication List: 1)  Amlodipine Besylate 10 Mg Tabs (Amlodipine besylate) .... Take 1 tablet by mouth once a day 2)  Metformin Hcl 500 Mg Xr24h-tab (Metformin hcl) .... 2 tab by mouth daily 3)  Victoza 18 Mg/33ml Soln (Liraglutide) .... 0.6 mg subcutaneously daily  x 1 week then increase to 1.2 mg subcutaneously daily 4)  Hydrochlorothiazide 12.5 Mg Tabs (Hydrochlorothiazide) .... Take 1 tab  by mouth every morning  Patient Instructions: 1)  Recommend restarting medication....make sure to take losartan, metformin, victoza and amlodipine. 2)  Check blood sugars.. fasting daily.Marland Kitchenocc 2  hour after meals. 3)  Record the measurement. 4)  Following BP at home.  5)   Consider seeing counselor. 6)  Remain out of work for 1 week ..follow up appt next Tuesday.  7)  Schedule CPX in 3 months with fasting LIPIDs, CMET, A1C, microalbumin Dx 250.00  Current Allergies (reviewed today): No known allergies

## 2010-03-01 NOTE — Letter (Signed)
Summary: F[employer] for Duty Report  F[employer] for Duty Report   Imported By: Maryln Gottron 09/24/2009 15:37:03  _____________________________________________________________________  External Attachment:    Type:   Image     Comment:   External Document

## 2010-03-01 NOTE — Progress Notes (Signed)
Summary: needs order for mammogram  Phone Note Call from Patient   Caller: Patient Call For: Kerby Nora MD Summary of Call: Pt needs order for mammogram, she goes to [place]. Initial call taken by: Lowella Petties CMA, AAMA,  November 26, 2009 2:13 PM

## 2010-03-01 NOTE — Letter (Signed)
Summary: [facility],Blood Sugar Readings  [facility],Blood Sugar Readings   Imported By: Beau Fanny 02/25/2009 10:26:41  _____________________________________________________________________  External Attachment:    Type:   Image     Comment:   External Document  Appended Document: [facility],Blood Sugar Readings Please tell pt notes from nutritionist reviewed. Agree with recommendations trying to increase metformin to 2 tabs  ( each)  by mouth two times a day.  Please have her do this if agreeable. MAy refill if needed. Please change med list to reflect med change.  Appended Document: [facility],Blood Sugar Readings Patient advised.Consuello Masse CMA

## 2010-03-01 NOTE — Medication Information (Signed)
Summary: Order for Diabetic Testing Supplies  Order for Diabetic Testing Supplies   Imported By: Maryln Gottron 11/01/2009 11:05:37  _____________________________________________________________________  External Attachment:    Type:   Image     Comment:   External Document

## 2010-03-01 NOTE — Consult Note (Signed)
Summary: [facility]  [facility]   Imported By: Lanelle Bal 02/19/2009 08:49:33  _____________________________________________________________________  External Attachment:    Type:   Image     Comment:   External Document

## 2010-03-01 NOTE — Progress Notes (Signed)
Summary: wants to try victoza  Phone Note Call from Patient Call back at Home Phone [phone] Call back at Work Phone [phone]   Caller: Patient Call For: Kerby Nora MD Summary of Call: Pt states she is no longer taking byetta, she is taking metformin.  She wants to try victoza.  She checked with her insurance company and they told her they would cover this at $137.00 for a 3 month supply.  Pt wants a 3 month supply [facility] [address] Initial call taken by: Lowella Petties CMA,  May 11, 2009 4:31 PM  Follow-up for Phone Call        Patient advised medication called in Follow-up by: Benny Lennert CMA Duncan Dull),  May 12, 2009 8:14 AM    New/Updated Medications: VICTOZA 18 MG/3ML SOLN (LIRAGLUTIDE) 0.6 mg Subcutaneously daily  x 1 week then increase to 1.2 mg Subcutaneously daily Prescriptions: VICTOZA 18 MG/3ML SOLN (LIRAGLUTIDE) 0.6 mg Subcutaneously daily  x 1 week then increase to 1.2 mg Subcutaneously daily  #3 month x 3   Entered and Authorized by:   Kerby Nora MD   Signed by:   Kerby Nora MD on 05/11/2009   Method used:   [facility]ically to        [facility] [employer] [phone]* (retail)       [address] [place], [place]  [address]       Ph: [phone] or [phone]       Fax: [phone]   RxID:   [id number]

## 2010-03-03 NOTE — Letter (Signed)
Summary: Results Follow up Letter  [place] [facility]  [address] [place], [place] [address]   Phone: [phone]  Fax: [phone]    01/10/2010 MRN: [id number]      [facility] [address] [place], [place]  [address]    Dear Patricia Horne,  The following are the results of your recent test(s):  Test         Result    Pap Smear:        Normal _____  Not Normal _____ Comments: ______________________________________________________ Cholesterol: LDL(Bad cholesterol):         Your goal is less than:         HDL (Good cholesterol):       Your goal is more than: Comments:  ______________________________________________________ Mammogram:        Normal ___x__  Not Normal _____ Comments:Repeat in 1 year  ___________________________________________________________________ Hemoccult:        Normal _____  Not normal _______ Comments:    _____________________________________________________________________ Other Tests:    We routinely do not discuss normal results over the telephone.  If you desire a copy of the results, or you have any questions about this information we can discuss them at your next office visit.   Sincerely,  Kerby Nora MD

## 2010-03-14 ENCOUNTER — Other Ambulatory Visit: Payer: Self-pay

## 2010-03-15 ENCOUNTER — Encounter (INDEPENDENT_AMBULATORY_CARE_PROVIDER_SITE_OTHER): Payer: Self-pay | Admitting: *Deleted

## 2010-03-15 ENCOUNTER — Other Ambulatory Visit (INDEPENDENT_AMBULATORY_CARE_PROVIDER_SITE_OTHER): Payer: BC Managed Care – PPO

## 2010-03-15 ENCOUNTER — Other Ambulatory Visit: Payer: Self-pay | Admitting: Family Medicine

## 2010-03-15 DIAGNOSIS — E119 Type 2 diabetes mellitus without complications: Secondary | ICD-10-CM

## 2010-03-15 LAB — HEPATIC FUNCTION PANEL
ALT: 16 U/L (ref 0–35)
AST: 15 U/L (ref 0–37)
Albumin: 3.6 g/dL (ref 3.5–5.2)
Alkaline Phosphatase: 78 U/L (ref 39–117)
Bilirubin, Direct: 0.2 mg/dL (ref 0.0–0.3)
Total Protein: 7.4 g/dL (ref 6.0–8.3)

## 2010-03-15 LAB — BASIC METABOLIC PANEL
CO2: 28 mEq/L (ref 19–32)
Calcium: 9.5 mg/dL (ref 8.4–10.5)
Chloride: 102 mEq/L (ref 96–112)
Glucose, Bld: 263 mg/dL — ABNORMAL HIGH (ref 70–99)
Potassium: 4 mEq/L (ref 3.5–5.1)
Sodium: 137 mEq/L (ref 135–145)

## 2010-03-15 LAB — LIPID PANEL
HDL: 44.1 mg/dL (ref >39.00)
Total CHOL/HDL Ratio: 3

## 2010-03-22 ENCOUNTER — Encounter: Payer: Self-pay | Admitting: Family Medicine

## 2010-03-22 ENCOUNTER — Encounter (INDEPENDENT_AMBULATORY_CARE_PROVIDER_SITE_OTHER): Payer: BC Managed Care – PPO | Admitting: Family Medicine

## 2010-03-22 DIAGNOSIS — R079 Chest pain, unspecified: Secondary | ICD-10-CM | POA: Insufficient documentation

## 2010-03-22 DIAGNOSIS — E119 Type 2 diabetes mellitus without complications: Secondary | ICD-10-CM

## 2010-03-22 DIAGNOSIS — Z01419 Encounter for gynecological examination (general) (routine) without abnormal findings: Secondary | ICD-10-CM

## 2010-03-22 DIAGNOSIS — I10 Essential (primary) hypertension: Secondary | ICD-10-CM

## 2010-03-22 DIAGNOSIS — Z0000 Encounter for general adult medical examination without abnormal findings: Secondary | ICD-10-CM

## 2010-03-29 NOTE — Assessment & Plan Note (Signed)
Summary: CPX/CLO   Vital Signs:  Patient profile:   60 year old female Height:      62.5 inches Weight:      185.75 pounds BMI:     33.55 Temp:     98.3 degrees F oral Pulse rate:   80 / minute Pulse rhythm:   regular BP sitting:   140 / 92  (left arm) Cuff size:   large  Vitals Entered By: Benny Lennert CMA Duncan Dull) (March 22, 2010 2:37 PM)  History of Present Illness: Chief complaint cpx with pap  The patient is here for annual wellness exam and preventative care.    She also has the following chronic issues  HTN, poor control DM, very poor control She has stopped her victoza and metformina s well as all her BP meds.  She is noncompliant with diet , exercsie and weight loss.   Not because of SE... just doesn't want to take meds. Scared to take it.... tried to reassure her.  Has only tried victoza a couple of doses.   Cholesterol is well controlled.   Mood, much improved. Things at work are much better.  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  Past Medical History: Reviewed history from 02/12/2008 and no changes required. 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 1983 ABDOMINAL HYSTERECTOMY, ONE OVARY REMOVED, LATER OTHER OVARY REMOVED 1990's BUNION, HAMMER-TOE REPAIR 2000?  STRESS TEST (-)  Family History: Reviewed history from 05/29/2006 and no changes required. Father: ALIVE 62 ARTHRITIS Mother: ALIVE 40 DM Siblings: 2 BROTHERS ONE WITH TYPE I DM, OTHER HEALTHY NO SISTERS NO MI <65  Social History: Reviewed history from 05/29/2006 and no changes required. Marital Status: SINGLE, NO SEX Children: NONE Occupation: TEACHER ASSISTANT, REGISTRAR IN ER [employer] DIET:  FAST FOOD Regular exercise-yes AT WORK  Review of Systems General:  Denies fatigue and fever. CV:  Complains of chest pain or discomfort; occ chest pain, nonexertional. No association with food,  Last seconds. No associated SOb, lightheadedness, occ arm pain as well. ? stress test earilier in 2000.. ? where done , but by previous MD.  . Resp:  Denies chest pain with inspiration. GI:  Denies abdominal pain and bloody stools. GU:  Denies dysuria.  Physical Exam  General:  obese female IN NAD  Eyes:  No corneal or conjunctival inflammation noted. EOMI. Perrla. Funduscopic exam benign, without hemorrhages, exudates or papilledema. Vision grossly normal. Ears:  External ear exam shows no significant lesions or deformities.  Otoscopic examination reveals clear canals, tympanic membranes are intact bilaterally without bulging, retraction, inflammation or discharge. Hearing  is grossly normal bilaterally. Nose:  External nasal examination shows no deformity or inflammation. Nasal mucosa are pink and moist without lesions or exudates. Mouth:  Oral mucosa and oropharynx without  lesions or exudates.  Teeth in good repair. Neck:  no carotid bruit or thyromegaly no cervical or supraclavicular lymphadenopathy  Chest Wall:  No deformities, masses, or tenderness noted. Breasts:  No mass, nodules, thickening, tenderness, bulging, retraction, inflamation, nipple discharge or skin changes noted.   Lungs:  Normal respiratory effort, chest expands symmetrically. Lungs are clear to auscultation, no crackles or wheezes. Heart:  Normal rate and regular rhythm. S1 and S2 normal without gallop, murmur, click, rub or other extra sounds. Abdomen:  Bowel sounds positive,abdomen soft and non-tender without masses, organomegaly or hernias noted. Genitalia:  normal introitus, no external lesions, no vaginal discharge, and no adnexal masses or tenderness.   Pulses:  R and L posterior tibial pulses are full and equal bilaterally  Extremities:  no edema  Skin:  Hyperpigementation under breasts. Psych:  Cognition and judgment appear intact. Alert and cooperative with normal attention span and concentration. No apparent delusions, illusions, hallucinations  Diabetes Management Exam:    Foot Exam (with socks and/or shoes not present):       Sensory-Pinprick/Light touch:          Left medial foot (L-4): normal          Left dorsal foot (L-5): normal          Left lateral foot (S-1): normal          Right medial foot (L-4): normal          Right dorsal foot (L-5): normal          Right lateral foot (S-1): normal       Sensory-Monofilament:          Left foot: normal          Right foot: normal       Inspection:          Left foot: normal          Right foot: normal       Nails:          Left foot: normal          Right foot: normal   Impression & Recommendations:  Problem # 1:  HEALTH SCREENING (ICD-V70.0) The patient's preventative maintenance and recommended screening tests for an annual wellness exam were reviewed in full today. Brought up to date unless services  declined.  Counselled on the importance of diet, exercise, and its role in overall health and mortality. The patient's FH and SH was reviewed, including their home life, tobacco status, and drug and alcohol status.     Problem # 2:  ROUTINE GYNECOLOGICAL EXAMINATION (ICD-V72.31) DVE no pap. ONly has one ovary remaining.   Problem # 3:  DM (ICD-250.00) Poor control off meds. Discussed restarting meds with pt... she will start victoza, follow blood sugars closely. She will then add metformin when comfortable. I spent time reassuring her that meds were safe together... and would not drop her to low.  Her updated medication list for this problem includes:    Metformin Hcl 500 Mg Xr24h-tab (Metformin hcl) .Marland Kitchen... 2 tab by mouth daily    Victoza 18 Mg/27ml Soln (Liraglutide) .Marland Kitchen... 0.6 mg subcutaneously daily  x 1 week then increase to 1.2 mg subcutaneously daily  Problem # 4:  HYPERTENSION (ICD-401.9) Inadequate control off meds.Encouraged exercise, weight loss, healthy  eating habits. Pt agreeable to restarting amlodipine ... will hold HCTZ unless not at goal next OV.  The following medications were removed from the medication list:    Hydrochlorothiazide 12.5 Mg Tabs (Hydrochlorothiazide) .Marland Kitchen... Take 1 tab  by mouth every morning Her updated medication list for this problem includes:    Amlodipine Besylate 10 Mg Tabs (Amlodipine besylate) .Marland Kitchen... Take 1 tablet by mouth once a day  Problem # 5:  CHEST PAIN, ACUTE (ICD-786.50) atypical... not clearly cardiac, but pt is higher risk with DM history.  EKG today showed: no specific ST changes, no LVH, No Q... we have none for comparison.   If chest pain continuing.. consider repeat stress test.  Orders: EKG w/ Interpretation (93000)  Complete Medication List: 1)  Amlodipine Besylate 10 Mg Tabs (Amlodipine besylate) .... Take 1 tablet by mouth once a day 2)  Metformin Hcl 500 Mg Xr24h-tab (Metformin hcl) .... 2 tab by mouth daily 3)  Victoza 18 Mg/27ml  Soln (Liraglutide) .... 0.6 mg subcutaneously daily  x 1 week then increase to 1.2 mg subcutaneously daily  Patient Instructions: 1)  Start back first on victoza and amlodipine... if feeling comfortable add back  metformin. 2)   Check BP at home daily, check blood sugars... record and bring that to next appt.  3)  Please schedule a follow-up appointment in 3 months .  4)  BMP prior to visit, ICD-9: 250.00 5)  Hepatic Panel prior to visit ICD-9:  6)  HgBA1c prior to visit  ICD-9:  7)  Check into when last tetanus was.   Orders Added: 1)  EKG w/ Interpretation [93000] 2)  Est. Patient 40-64 years [99396] 3)  Est. Patient Level III [99213]    Prior Medications (reviewed today): None Current Allergies (reviewed today): No known allergies   Flu Vaccine Next Due:  Refused Last TD:  Historical (01/31/2000 3:12:01 PM) TD Result Date:  03/02/2009 TD Result:  estimated TD Next Due:  10 yr   Past Medical History:    Reviewed history from 02/12/2008 and no changes required:       Current Problems:        GASTROENTERITIS, ACUTE (ICD-558.9)       DM (ICD-250.00)       SCREENING FOR LIPOID DISORDERS (ICD-V77.91)       SPECIAL SCREENING FOR OSTEOPOROSIS (ICD-V82.81)       OTHER SCREENING MAMMOGRAM (ICD-V76.12)       URI (ICD-465.9)       TIBIALIS TENDINITIS (ICD-726.72)       ABDOMINAL PAIN, LEFT LOWER QUADRANT (ICD-789.04)       HEALTH SCREENING (ICD-V70.0)       OBESITY (ICD-278.00)       LIPOMA, 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  STRESS TEST (-)

## 2010-05-08 LAB — CREATININE, SERUM
Creatinine, Ser: 0.66 mg/dL (ref 0.4–1.2)
GFR calc Af Amer: >60 mL/min (ref >60)
GFR calc non Af Amer: >60 mL/min (ref >60)

## 2010-06-15 ENCOUNTER — Other Ambulatory Visit: Payer: Self-pay | Admitting: Family Medicine

## 2010-06-16 ENCOUNTER — Other Ambulatory Visit (INDEPENDENT_AMBULATORY_CARE_PROVIDER_SITE_OTHER): Payer: BC Managed Care – PPO | Admitting: Family Medicine

## 2010-06-16 DIAGNOSIS — E119 Type 2 diabetes mellitus without complications: Secondary | ICD-10-CM

## 2010-06-16 LAB — HEPATIC FUNCTION PANEL
Albumin: 3.1 g/dL — ABNORMAL LOW (ref 3.5–5.2)
Alkaline Phosphatase: 91 U/L (ref 39–117)
Bilirubin, Direct: 0.1 mg/dL (ref 0.0–0.3)

## 2010-06-16 LAB — BASIC METABOLIC PANEL
Calcium: 9 mg/dL (ref 8.4–10.5)
GFR: 111.69 mL/min (ref >60.00)
Sodium: 138 mEq/L (ref 135–145)

## 2010-06-17 NOTE — Assessment & Plan Note (Signed)
[facility]                           [facility] OFFICE NOTE   Patricia Horne, Patricia Horne                           MRN:          [id number]  DATE:04/23/2006                            DOB:          1950/03/03    CHIEF COMPLAINT:  A 60 year old black female here to establish new  doctor.   HISTORY OF PRESENT ILLNESS:  1. Hypertension, chronic: She states that she has been very      noncompliant in the past with her blood pressure. She was      previously on lisinopril and hydrochlorothiazide and when she does      take it she has well-controlled blood pressure. She has been off of      this medication for quite some time. Of note, she states that she      has had some occasional burning, mild chest pain in the center of      her chest on-and-off over the past year. She has not mentioned it      to her previous doctor. It occurs at rest and there is no change      with exertion. It occasionally worsens with eating. It lasts a few      minutes at a time.  2. She also reports some dull ache in her abdomen for approximately      one month. The pain is intermittent and across her lower abdomen      bilaterally. She has had some vaginal discharge, but denies any      itching. The pain initially was associated with some constipation,      but she took Dulcolax and had improvement, but then the symptoms      came back. She has occasionally now had some loose stools, but      denies blood in her stools. There is no change in the pain after      eating or no relief with bowel movements. She does awake with some      of the abdominal soreness in the morning. She states that it is      fairly mild, 3-4 on a pain scale. She occasionally has problems      with gas. She denies pain with urination, blood in her urine and      reports normal urine output. She does state that in the past she      has had a urinary tract infection diagnosed even though she did not      have  symptoms. Review of systems is otherwise negative.   PAST MEDICAL HISTORY:  1. Borderline diabetes.  2. Heart murmur.  3. Hypertension.  4. Lipoma in left arm.   HOSPITALIZATIONS, SURGERIES, AND PROCEDURES:  1. In 1983, abdominal hysterectomy one ovary removed.  2. Later second ovary removed.  3. In 1990s, bunion and hammertoe repair.  4. In 2000, stress test negative.  5. Mammogram in 2007, negative.  6. Pap smear in 2007, negative.  7. Colonoscopy in 2006, negative.   ALLERGIES:  None.   MEDICATIONS:  None.   SOCIAL HISTORY:  No tobacco, no alcohol and no drugs. 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 She is single. She is not  sexually active. She has no children. She gets limited exercise, none in  addition to what she obtains at work. She states that her diet is very  poor with eating a lot of fast foods.   FAMILY HISTORY:  Father is alive at age 8 with arthritis. Mother is  alive at age 14 with diabetes. No family history of heart attack before  age 75. She has two brothers, one with Type 1 diabetes and one who is  healthy. She has no sisters. She has no family history of cancer.   PHYSICAL EXAMINATION:  VITAL SIGNS: Height is 62.5 inches. Weight is  196, making BMI 35. Blood pressure 172/106. Pulse 84. Temperature 97.8.  GENERAL: Obese-appearing female in no apparent distress.  HEENT: PERRLA. Extra-ocular muscles intact. Oropharynx clear. Tympanic  membranes clear. Nares clear. No papilledema. No AV kicking in retina.  No thyromegaly.  CARDIOVASCULAR: Regular rate and rhythm. No murmurs, rubs or gallops.  Normal PMI. 2+ peripheral pulses. No peripheral edema.  LUNGS: Clear to auscultation bilaterally. No wheezes, rales or rhonchi.  ABDOMEN: Soft, obese, and mild tenderness across lower abdomen  bilaterally. No rebound, no guarding.  No hepatosplenomegaly.  MUSCULOSKELETAL:  Strength 5/5 in upper and lower extremities.  NEURO: Alert and oriented x3. Cranial nerves II-XII grossly intact.  Reflexes 2+ patellar.   ASSESSMENT/PLAN:  1. Hypertension, poor control: Will begin her again on      lisinopril/hydrochlorothiazide 20/25 mg daily. We will recheck a      kidney function test in 7-10 days to make sure there is no change      with restarting this medication.  2. Chest pain, atypical: Her chest pain sounds most likely not to be      secondary to a cardiac source. An EKG was done today that showed no      evidence of ST changes or Q-waves or LVH from chronic hypertension.      Her symptoms do sound to be most likely associated with occasional      reflux.  3. Lower abdominal pain bilaterally: Given her concern about vaginal      discharge and her history of urinary tract infections, a wet prep      and urinalysis were obtained. Both of these were normal, showing no      urinary infection or vaginal infections. Her lower abdominal pain      may most likely be secondary to poor eating habits and high fat      diet. She was instructed to work on decreasing the fat in her diet      and increase more fiber. I will also check a complete metabolic      panel to make sure there is no abnormality. She does not have any      ovaries or uterus that would suggest abnormal pathology in her      bilateral lower quadrants. If her symptoms continue, she will      followup within the next few weeks.  4. Prevention: She is up-to-date with the majority of her prevention.      I will obtain records from her previous doctor to determine if      anything else is due such as tetanus.     Kerby Nora, MD  Electronically Signed   AB/MedQ  DD: 04/24/2006  DT: 04/24/2006  Job #: [id number]

## 2010-06-18 ENCOUNTER — Encounter: Payer: Self-pay | Admitting: Family Medicine

## 2010-06-21 ENCOUNTER — Ambulatory Visit: Payer: BC Managed Care – PPO | Admitting: Family Medicine

## 2010-06-21 ENCOUNTER — Encounter: Payer: Self-pay | Admitting: Family Medicine

## 2010-06-21 ENCOUNTER — Ambulatory Visit (INDEPENDENT_AMBULATORY_CARE_PROVIDER_SITE_OTHER): Payer: BC Managed Care – PPO | Admitting: Family Medicine

## 2010-06-21 DIAGNOSIS — I10 Essential (primary) hypertension: Secondary | ICD-10-CM

## 2010-06-21 DIAGNOSIS — E119 Type 2 diabetes mellitus without complications: Secondary | ICD-10-CM

## 2010-06-21 NOTE — Patient Instructions (Addendum)
Try taking metformin just one tablet daily, give at least 1-2 weeks before deciding cannot tolerate. If able try to gradually take victoza 5-6 days a week, then every day of the week.  If feel poorly ... Check blood sugar. Low less than 60. Goal fasting blood sugars to be 80-120.  Start WALKING or other exercise. Get yearly eye exam.

## 2010-06-21 NOTE — Assessment & Plan Note (Signed)
Poor control. Pt very hesitant about meds. Encouraged her to start back metformin but at lower dose to avoid SE. Encouraged exercsia dn diet changes and to gradually increase victoza to take every day.  Recheck in 3 months.

## 2010-06-21 NOTE — Assessment & Plan Note (Signed)
Well controlled in office, elevated occ at home. Continue to follow. Changes frequently due to stress at work.

## 2010-06-21 NOTE — Progress Notes (Signed)
  Subjective:    Patient ID: Patricia Horne, female    DOB: January 26, 1951, 60 y.o.   MRN: [id number]  HPI  Diabetes: Poor control  Using medications without difficulties: Noncompliant with meds as prescribed, using Victoza 1.2 but missing some doses (taking 3-4 times a week in last few weeks) Has lost 3 lbs in last 3 months. Not taking metformin at all because of stomach irritation on 2 tabs daily Has had some improvement in appetite. Hypoglycemic episodes:None Hyperglycemic episodes:? Feet problems:None Blood Sugars averaging: Not checking regularly. eye exam within last year: no  Hypertension:  Improved control on amlodipine daily. BPs at goal <130/80  Using medication without problems or lightheadedness:  Chest pain with exertion: None Edema:None Short of breath:None Average home BPs: 130/80-140/90  Other issues: decrease stress.  Has not started walking yet.  4-5 months, noted intermittant burning in right breast. No rash. No mass, no change in nipple. Nml mamogram in 12/2010 Refused breast exam today, will let me know if not improving.   Review of Systems     Objective:   Physical Exam  Constitutional: Vital signs are normal. She appears well-developed and well-nourished. She is cooperative.  Non-toxic appearance. She does not appear ill. No distress.  HENT:  Head: Normocephalic.  Right Ear: Hearing, tympanic membrane, external ear and ear canal normal. Tympanic membrane is not erythematous, not retracted and not bulging.  Left Ear: Hearing, tympanic membrane, external ear and ear canal normal. Tympanic membrane is not erythematous, not retracted and not bulging.  Nose: No mucosal edema or rhinorrhea. Right sinus exhibits no maxillary sinus tenderness and no frontal sinus tenderness. Left sinus exhibits no maxillary sinus tenderness and no frontal sinus tenderness.  Mouth/Throat: Uvula is midline, oropharynx is clear and moist and mucous membranes are normal.  Eyes:  Conjunctivae, EOM and lids are normal. Pupils are equal, round, and reactive to light. No foreign bodies found.  Neck: Trachea normal and normal range of motion. Neck supple. Carotid bruit is not present. No mass and no thyromegaly present.  Cardiovascular: Normal rate, regular rhythm, S1 normal, S2 normal, normal heart sounds, intact distal pulses and normal pulses.  Exam reveals no gallop and no friction rub.   No murmur heard. Pulmonary/Chest: Effort normal and breath sounds normal. Not tachypneic. No respiratory distress. She has no decreased breath sounds. She has no wheezes. She has no rhonchi. She has no rales.  Abdominal: Soft. Normal appearance and bowel sounds are normal. There is no tenderness.  Neurological: She is alert.  Skin: Skin is warm, dry and intact. No rash noted.  Psychiatric: Her speech is normal and behavior is normal. Judgment and thought content normal. Her mood appears not anxious. Cognition and memory are normal. She does not exhibit a depressed mood.      Diabetic foot exam: Normal inspection No skin breakdown No calluses  Normal DP pulses Normal sensation to light tough and monofilament Nails normal     Assessment & Plan:

## 2010-09-26 ENCOUNTER — Other Ambulatory Visit: Payer: BC Managed Care – PPO

## 2010-09-30 ENCOUNTER — Ambulatory Visit: Payer: BC Managed Care – PPO | Admitting: Family Medicine

## 2010-10-05 ENCOUNTER — Ambulatory Visit (INDEPENDENT_AMBULATORY_CARE_PROVIDER_SITE_OTHER): Payer: BC Managed Care – PPO | Admitting: Family Medicine

## 2010-10-05 ENCOUNTER — Encounter: Payer: Self-pay | Admitting: Family Medicine

## 2010-10-05 DIAGNOSIS — I10 Essential (primary) hypertension: Secondary | ICD-10-CM

## 2010-10-05 DIAGNOSIS — E119 Type 2 diabetes mellitus without complications: Secondary | ICD-10-CM

## 2010-10-05 DIAGNOSIS — R21 Rash and other nonspecific skin eruption: Secondary | ICD-10-CM

## 2010-10-05 MED ORDER — METFORMIN HCL 500 MG PO TABS: 1000.0000 mg | ORAL_TABLET | Freq: Two times a day (BID) | ORAL | Status: DC

## 2010-10-05 NOTE — Assessment & Plan Note (Signed)
ON foot. Resolved on its own.

## 2010-10-05 NOTE — Assessment & Plan Note (Signed)
Well controlled. Continue current medication. Anxious today because of concern about my reaction to not taking meds.

## 2010-10-05 NOTE — Patient Instructions (Addendum)
Restart BP medication daily. Follow BP at home. Start metformin twice daily.Molli Knock to break up/crush etc. Bring blood sugar measurements to next appt.

## 2010-10-05 NOTE — Assessment & Plan Note (Addendum)
Very poor control. Discussed med options in detail with patient.  She is interested in trying to break up metformin.. So will change to fast release.  Will start by trying to get compliance with max dose of this.  Follow up in 3 months.  If she tolerates this we can consider adding glucotrol , januvia.  She refuses insulin, does not like victoza, refuses actos.

## 2010-10-05 NOTE — Progress Notes (Signed)
  Subjective:    Patient ID: Patricia Horne, female    DOB: Jun 14, 1950, 60 y.o.   MRN: [id number]  HPI  60 year old diabetic female  presents with 1 week ago blood blisters on left 2cd digit.  She cleaned with peroxide... It went away on its own in the last few days. No known injury.   DM poor control: Not taking any med.. Skipped A1C recheck because she knew it would nbe high. Metformin.. Does not like because of size. Victoza never started back regualrly.  She has a "phobia about taking medication." She doesn't like how it coats her tounge, does not like how she feels with .  not checking CBGs regularly.  No recent eye exam.  Hypertension:  Well controlled at home, using med prn. Anxious today. Using medication without problems or lightheadedness: None Chest pain with exertion:None Edema:None Short of breath:None Average home BPs: <130/80 Other issues:    Review of Systems  Constitutional: Negative for fever and fatigue.  HENT: Negative for ear pain.   Eyes: Negative for pain.  Respiratory: Negative for chest tightness and shortness of breath.   Cardiovascular: Negative for chest pain, palpitations and leg swelling.  Gastrointestinal: Negative for abdominal pain.  Genitourinary: Negative for dysuria.       Objective:   Physical Exam  Constitutional: Vital signs are normal. She appears well-developed and well-nourished. She is cooperative.  Non-toxic appearance. She does not appear ill. No distress.  HENT:  Head: Normocephalic.  Right Ear: Hearing, tympanic membrane, external ear and ear canal normal. Tympanic membrane is not erythematous, not retracted and not bulging.  Left Ear: Hearing, tympanic membrane, external ear and ear canal normal. Tympanic membrane is not erythematous, not retracted and not bulging.  Nose: No mucosal edema or rhinorrhea. Right sinus exhibits no maxillary sinus tenderness and no frontal sinus tenderness. Left sinus exhibits no maxillary sinus  tenderness and no frontal sinus tenderness.  Mouth/Throat: Uvula is midline, oropharynx is clear and moist and mucous membranes are normal.  Eyes: Conjunctivae, EOM and lids are normal. Pupils are equal, round, and reactive to light. No foreign bodies found.  Neck: Trachea normal and normal range of motion. Neck supple. Carotid bruit is not present. No mass and no thyromegaly present.  Cardiovascular: Normal rate, regular rhythm, S1 normal, S2 normal, normal heart sounds, intact distal pulses and normal pulses.  Exam reveals no gallop and no friction rub.   No murmur heard. Pulmonary/Chest: Effort normal and breath sounds normal. Not tachypneic. No respiratory distress. She has no decreased breath sounds. She has no wheezes. She has no rhonchi. She has no rales.  Abdominal: Soft. Normal appearance and bowel sounds are normal. There is no tenderness.  Neurological: She is alert.  Skin: Skin is warm, dry and intact. No rash noted.  Psychiatric: Her speech is normal and behavior is normal. Judgment and thought content normal. Her mood appears not anxious. Cognition and memory are normal. She does not exhibit a depressed mood.    Diabetic foot exam: Normal inspection No skin breakdown No calluses  Normal DP pulses Normal sensation to light touch and monofilament Nails normal       Assessment & Plan:

## 2010-12-08 ENCOUNTER — Telehealth: Payer: Self-pay | Admitting: *Deleted

## 2010-12-08 DIAGNOSIS — Z1231 Encounter for screening mammogram for malignant neoplasm of breast: Secondary | ICD-10-CM

## 2010-12-08 NOTE — Telephone Encounter (Signed)
Pt needs order for screening mammogram, she goes to [place].

## 2010-12-12 NOTE — Telephone Encounter (Signed)
Pt will call to set up her own MMG. She works at [employer].

## 2010-12-19 ENCOUNTER — Ambulatory Visit: Payer: Self-pay | Admitting: Family Medicine

## 2010-12-20 ENCOUNTER — Encounter: Payer: Self-pay | Admitting: Family Medicine

## 2010-12-21 ENCOUNTER — Encounter: Payer: Self-pay | Admitting: *Deleted

## 2011-01-02 ENCOUNTER — Telehealth: Payer: Self-pay | Admitting: Family Medicine

## 2011-01-02 NOTE — Telephone Encounter (Signed)
Message copied by Excell Seltzer on Mon Jan 02, 2011 10:34 AM ------      Message from: Alvina Chou      Created: Wed Dec 28, 2010  4:11 PM      Regarding: order       A1C ordered, just making sure that's all you need. Thanks, Camelia Eng

## 2011-01-04 ENCOUNTER — Other Ambulatory Visit (INDEPENDENT_AMBULATORY_CARE_PROVIDER_SITE_OTHER): Payer: BC Managed Care – PPO

## 2011-01-04 DIAGNOSIS — E119 Type 2 diabetes mellitus without complications: Secondary | ICD-10-CM

## 2011-01-04 LAB — HEMOGLOBIN A1C: Hgb A1c MFr Bld: 13.3 % — ABNORMAL HIGH (ref 4.6–6.5)

## 2011-01-12 ENCOUNTER — Ambulatory Visit (INDEPENDENT_AMBULATORY_CARE_PROVIDER_SITE_OTHER): Payer: BC Managed Care – PPO | Admitting: Family Medicine

## 2011-01-12 ENCOUNTER — Encounter: Payer: Self-pay | Admitting: Family Medicine

## 2011-01-12 DIAGNOSIS — I10 Essential (primary) hypertension: Secondary | ICD-10-CM

## 2011-01-12 DIAGNOSIS — E119 Type 2 diabetes mellitus without complications: Secondary | ICD-10-CM

## 2011-01-12 MED ORDER — LIRAGLUTIDE 18 MG/3ML ~~LOC~~ SOLN: 0.6000 mg | Freq: Every day | SUBCUTANEOUS | Status: DC

## 2011-01-12 NOTE — Patient Instructions (Addendum)
Continue to work on diet , exercise and weight loss. Restart victoza. Increase after 1 week if tolerated.  Check fasting blood sugar daily in AMs. Write it down in journal. Bring to next appt.  Goal Fasting BS 80-120, 2 hours after meals goal is <180.  Follow up in 1 month.

## 2011-01-12 NOTE — Assessment & Plan Note (Signed)
White coat HTN.  Will follow and recheck in 1 month. Encouraged exercise, weight loss, healthy eating habits.

## 2011-01-12 NOTE — Assessment & Plan Note (Signed)
Poor control. Has had some weight loss. Continue working on lifestyle. Will retry victoza. Stop metformin as she is not taking any way.[place] it make her feel bad. Offered/ encouraged insulin..the patient is very hesitant.

## 2011-01-12 NOTE — Progress Notes (Signed)
Subjective:    Patient ID: Patricia Horne, female    DOB: 02-Sep-1950, 60 y.o.   MRN: [id number]  HPI  Diabetes:  Poor control despite fast release metformin. Pt very noncompliant with medication because it makes her feel bad. Has not been taking med at all recently. Refuses actos due to concerns of media issue. Victoza never started back regualrly... But willing to try.  She is very hesitant about insulin. Fatigue all the time. Feels worse in mornings.  Lab Results  Component Value Date   HGBA1C 13.3* 01/04/2011    Has been working on low carb diet.. Has lost 5 lbs  In last 3 months. Occ walking for exercise. 2 times a week doing [employer] tape.   She has a "phobia about taking medication." She doesn't like how it coats her tounge, does not like how she feels with .  not checking CBGs regularly.  Using medications without difficulties: Hypoglycemic episodes:? Hyperglycemic episodes:? Feet problems:None Blood Sugars averaging:not checking eye exam within last year:   Hypertension:   On amlodipine  Using medication without problems or lightheadedness:  Chest pain with exertion:None Edema:None Short of breath:None Average home BPs: well controlled at home.. Anxious today given DM results. Other issues:    Review of Systems  Constitutional: Negative for fever and fatigue.  HENT: Negative for ear pain.   Eyes: Negative for pain.  Respiratory: Negative for chest tightness and shortness of breath.   Cardiovascular: Negative for chest pain, palpitations and leg swelling.  Gastrointestinal: Negative for abdominal pain.  Genitourinary: Negative for dysuria.       Objective:   Physical Exam  Constitutional: Vital signs are normal. She appears well-developed and well-nourished. She is cooperative.  Non-toxic appearance. She does not appear ill. No distress.  HENT:  Head: Normocephalic.  Right Ear: Hearing, tympanic membrane, external ear and ear canal normal. Tympanic membrane is  not erythematous, not retracted and not bulging.  Left Ear: Hearing, tympanic membrane, external ear and ear canal normal. Tympanic membrane is not erythematous, not retracted and not bulging.  Nose: No mucosal edema or rhinorrhea. Right sinus exhibits no maxillary sinus tenderness and no frontal sinus tenderness. Left sinus exhibits no maxillary sinus tenderness and no frontal sinus tenderness.  Mouth/Throat: Uvula is midline, oropharynx is clear and moist and mucous membranes are normal.  Eyes: Conjunctivae, EOM and lids are normal. Pupils are equal, round, and reactive to light. No foreign bodies found.  Neck: Trachea normal and normal range of motion. Neck supple. Carotid bruit is not present. No mass and no thyromegaly present.  Cardiovascular: Normal rate, regular rhythm, S1 normal, S2 normal, normal heart sounds, intact distal pulses and normal pulses.  Exam reveals no gallop and no friction rub.   No murmur heard. Pulmonary/Chest: Effort normal and breath sounds normal. Not tachypneic. No respiratory distress. She has no decreased breath sounds. She has no wheezes. She has no rhonchi. She has no rales.  Abdominal: Soft. Normal appearance and bowel sounds are normal. There is no tenderness.  Neurological: She is alert.  Skin: Skin is warm, dry and intact. No rash noted.  Psychiatric: Her speech is normal and behavior is normal. Judgment and thought content normal. Her mood appears not anxious. Cognition and memory are normal. She does not exhibit a depressed mood.   Diabetic foot exam: Normal inspection No skin breakdown No calluses  Normal DP pulses Normal sensation to light touch and monofilament Nails normal      Assessment & Plan:

## 2011-01-16 ENCOUNTER — Other Ambulatory Visit: Payer: Self-pay | Admitting: Family Medicine

## 2011-02-03 ENCOUNTER — Ambulatory Visit: Payer: BC Managed Care – PPO | Admitting: Family Medicine

## 2011-02-17 ENCOUNTER — Ambulatory Visit: Payer: BC Managed Care – PPO | Admitting: Family Medicine

## 2011-03-03 ENCOUNTER — Ambulatory Visit (INDEPENDENT_AMBULATORY_CARE_PROVIDER_SITE_OTHER): Payer: BC Managed Care – PPO | Admitting: Family Medicine

## 2011-03-03 ENCOUNTER — Encounter: Payer: Self-pay | Admitting: Family Medicine

## 2011-03-03 VITALS — BP 140/92 | HR 84 | Temp 98.0°F | Ht 62.5 in | Wt 177.8 lb

## 2011-03-03 DIAGNOSIS — R5381 Other malaise: Secondary | ICD-10-CM

## 2011-03-03 DIAGNOSIS — R5383 Other fatigue: Secondary | ICD-10-CM | POA: Insufficient documentation

## 2011-03-03 DIAGNOSIS — E119 Type 2 diabetes mellitus without complications: Secondary | ICD-10-CM

## 2011-03-03 MED ORDER — GLIPIZIDE ER 5 MG PO TB24: ORAL_TABLET | ORAL | Status: DC

## 2011-03-03 NOTE — Progress Notes (Signed)
  Subjective:    Patient ID: Patricia Horne, female    DOB: 1950-12-06, 61 y.o.   MRN: [id number]  HPI  61 year old female with poorly controlled DM returns to clinic, 1 month after retrying victoza.  She tried taking both metformin and victoza.Felt ill even when was on victoza by itself for 1 week, low dose.. So went back to taking metfromin daily alone, but hates the dry mouth that goes with this.   She has lost 1 lb in the last month. Has not been exercising, but plans to start walking.  FBS 199-217   She has been fatigued and is interested in testing for low. Moody. Occ lower B abdominal pain in last month.. 7/10 pain scale , intermittant.  Some intermittant longterm diarrhea, no constipation. Worse with metformin. S/P TAH and BSO.   Review of Systems  Constitutional: Negative for fever and fatigue.  HENT: Negative for ear pain.   Eyes: Negative for pain.  Respiratory: Negative for chest tightness and shortness of breath.   Cardiovascular: Negative for chest pain, palpitations and leg swelling.  Gastrointestinal: Negative for abdominal pain.  Genitourinary: Negative for dysuria.       Objective:   Physical Exam  Constitutional: She appears well-developed and well-nourished.       central obesity  Cardiovascular: Normal rate, regular rhythm, normal heart sounds and intact distal pulses.  Exam reveals no gallop and no friction rub.   No murmur heard. Pulmonary/Chest: Effort normal and breath sounds normal. No respiratory distress. She has no wheezes. She has no rales. She exhibits no tenderness.  Abdominal: Soft. Bowel sounds are normal. She exhibits no distension and no mass. There is no tenderness. There is no rebound and no guarding.          Assessment & Plan:

## 2011-03-03 NOTE — Assessment & Plan Note (Signed)
Likely multifactorial...will check TSH given never evaluated and specific pt concern.

## 2011-03-03 NOTE — Assessment & Plan Note (Signed)
Poor control. Pt biggest issue is compliance. She does not like to take victoza or metfomin because of relatively mild SE. To improve compliance.. We will try a low dose glucotrol (may or may not be effective at this point) given likely would be well tolerated.  If she has SE... (she does not want meds with weight gain like actos or insulin) we could consider trying onglyza.  Follow up in 3 months.

## 2011-03-03 NOTE — Patient Instructions (Addendum)
Stop victoza and metfromin. Will try trial of glucotrol. If tolerating glucotrol low dose after a few weeks and no low blood sugars less 60, you can increase glucotrol to 2 tabs in the morning. Follow up in 3 months with labs prior.

## 2011-05-05 ENCOUNTER — Telehealth: Payer: Self-pay | Admitting: Family Medicine

## 2011-05-05 NOTE — Telephone Encounter (Signed)
Caller: Sherronda/Patient; PCP: Kerby Nora E.; CB#: [phone]; ; ; Call regarding vomiting; onset 05/04/11 of L sided pain and vomiting.  Pain resolved when the vomiting started.  Last BM 05/04/11 which was normal.  Seen at hospital 05/04/11.  Vomiting resolved since 05/04/11 PM.  States when seen by PA at the hospital clinic, there was glucose in the urine on dipstick.   Culture was not sent.  states she feels better 05/05/11.  RN requested patient get her BS checked; 220 at 1515.  Denies emergent symptoms per protocol; advised for home care, with callback parameters given.

## 2011-05-07 NOTE — Telephone Encounter (Signed)
Noted  

## 2011-06-06 ENCOUNTER — Ambulatory Visit (INDEPENDENT_AMBULATORY_CARE_PROVIDER_SITE_OTHER): Payer: BC Managed Care – PPO | Admitting: Family Medicine

## 2011-06-06 ENCOUNTER — Encounter: Payer: Self-pay | Admitting: Family Medicine

## 2011-06-06 VITALS — BP 130/80 | HR 94 | Temp 98.8°F | Ht 62.5 in | Wt 179.8 lb

## 2011-06-06 DIAGNOSIS — L538 Other specified erythematous conditions: Secondary | ICD-10-CM

## 2011-06-06 DIAGNOSIS — R0789 Other chest pain: Secondary | ICD-10-CM

## 2011-06-06 DIAGNOSIS — I10 Essential (primary) hypertension: Secondary | ICD-10-CM

## 2011-06-06 DIAGNOSIS — L304 Erythema intertrigo: Secondary | ICD-10-CM

## 2011-06-06 MED ORDER — KETOCONAZOLE 2 % EX CREA: TOPICAL_CREAM | Freq: Every day | CUTANEOUS | Status: DC

## 2011-06-06 NOTE — Assessment & Plan Note (Signed)
Given patient description.. Most likely due to GERD.  Will treat with diet modification, trigger avoidance and prilosec 40 mg daily. She is high risk for CAD though with poorly controlled DM, so if not resovleing or becomes exertional.. Will send for stress test.  She has not had stress test in many years.

## 2011-06-06 NOTE — Progress Notes (Signed)
  Subjective:    Patient ID: Marites Nath, female    DOB: [birth year], 61 y.o.   MRN: [id number]  HPI  61 year old female with poorly control diabetes, HTN presents with chest pain across central chest last week and yesterday. Across tops of breasts. Describes it as burning.  Occurred during rest, no no exertional association. Occurred when working, sitting. Occurred after [facility]. Stopped coffee 1 month ago. No other change in diet. No symptoms today. No known injury/fall. No change with arm movement. Not on any reflux medicaitons  No associated symptoms.. No palpatations, no SOB, no sweating.  No family history of CAD.  Redness at umbilicus.Marland Kitchen Applying peroxide. When warm gets red and , mild    Review of Systems  Constitutional: Negative for fever and fatigue.  HENT: Negative for ear pain.   Eyes: Negative for pain.  Respiratory: Negative for chest tightness and shortness of breath.   Cardiovascular: Positive for chest pain. Negative for palpitations and leg swelling.  Gastrointestinal: Negative for abdominal pain.  Genitourinary: Negative for dysuria.       Objective:   Physical Exam  Constitutional: Vital signs are normal. She appears well-developed and well-nourished. She is cooperative.  Non-toxic appearance. She does not appear ill. No distress.  HENT:  Head: Normocephalic.  Right Ear: Hearing, tympanic membrane, external ear and ear canal normal. Tympanic membrane is not erythematous, not retracted and not bulging.  Left Ear: Hearing, tympanic membrane, external ear and ear canal normal. Tympanic membrane is not erythematous, not retracted and not bulging.  Nose: No mucosal edema or rhinorrhea. Right sinus exhibits no maxillary sinus tenderness and no frontal sinus tenderness. Left sinus exhibits no maxillary sinus tenderness and no frontal sinus tenderness.  Mouth/Throat: Uvula is midline, oropharynx is clear and moist and mucous membranes are normal.  Eyes:  Conjunctivae, EOM and lids are normal. Pupils are equal, round, and reactive to light. No foreign bodies found.  Neck: Trachea normal and normal range of motion. Neck supple. Carotid bruit is not present. No mass and no thyromegaly present.  Cardiovascular: Normal rate, regular rhythm, S1 normal, S2 normal, normal heart sounds, intact distal pulses and normal pulses.  Exam reveals no gallop and no friction rub.   No murmur heard. Pulmonary/Chest: Effort normal and breath sounds normal. Not tachypneic. No respiratory distress. She has no decreased breath sounds. She has no wheezes. She has no rhonchi. She has no rales. Chest wall is not dull to percussion. She exhibits no mass, no tenderness, no bony tenderness and no deformity.  Abdominal: Soft. Normal appearance and bowel sounds are normal. There is no tenderness.  Neurological: She is alert.  Skin: Skin is warm, dry and intact. No rash noted.       Mild erythema and hyperpigmentation at umbilicus.  Psychiatric: Her speech is normal and behavior is normal. Judgment and thought content normal. Her mood appears not anxious. Cognition and memory are normal. She does not exhibit a depressed mood.          Assessment & Plan:

## 2011-06-06 NOTE — Patient Instructions (Addendum)
Apply antifungal cream for yeast infection in naval. Start prilosec 2 X 20 mg tablet daily, if not improving in 1 week.. Call for stress test to evaluate heart further.  If severe chest pain or exertional chest pain.. Go to ER.

## 2011-06-06 NOTE — Assessment & Plan Note (Signed)
Well controlled. Continue current medication.  

## 2011-06-06 NOTE — Assessment & Plan Note (Signed)
Due to obesity and DM poor control. Treat with ketoconazole topical cream.

## 2011-06-30 ENCOUNTER — Telehealth: Payer: Self-pay

## 2011-06-30 DIAGNOSIS — E119 Type 2 diabetes mellitus without complications: Secondary | ICD-10-CM

## 2011-06-30 NOTE — Telephone Encounter (Signed)
Pt request diabetic teaching referral to lifestyle center at [facility].

## 2011-07-16 ENCOUNTER — Other Ambulatory Visit: Payer: Self-pay | Admitting: Otolaryngology

## 2011-07-16 LAB — TSH: Thyroid Stimulating Horm: 2.47 u[IU]/mL

## 2011-07-19 ENCOUNTER — Ambulatory Visit: Payer: Self-pay | Admitting: Family Medicine

## 2011-07-21 ENCOUNTER — Telehealth: Payer: Self-pay | Admitting: Family Medicine

## 2011-07-21 NOTE — Telephone Encounter (Signed)
Caller: Sherby/Mother; Phone Number: [phone]; Message from caller: Patient has meter but no strips or lancets

## 2011-07-24 LAB — HM DIABETES EYE EXAM

## 2011-07-27 ENCOUNTER — Other Ambulatory Visit: Payer: Self-pay | Admitting: *Deleted

## 2011-07-27 MED ORDER — ACCU-CHEK SOFTCLIX LANCET DEV MISC: Status: DC

## 2011-07-27 MED ORDER — GLUCOSE BLOOD VI STRP: ORAL_STRIP | Status: AC

## 2011-07-31 ENCOUNTER — Ambulatory Visit: Payer: Self-pay | Admitting: Family Medicine

## 2011-09-08 ENCOUNTER — Ambulatory Visit: Payer: Self-pay | Admitting: Family Medicine

## 2011-09-22 ENCOUNTER — Encounter: Payer: Self-pay | Admitting: Family Medicine

## 2011-09-22 ENCOUNTER — Ambulatory Visit (INDEPENDENT_AMBULATORY_CARE_PROVIDER_SITE_OTHER): Payer: BC Managed Care – PPO | Admitting: Family Medicine

## 2011-09-22 VITALS — BP 160/98 | HR 84 | Temp 97.8°F | Wt 177.0 lb

## 2011-09-22 DIAGNOSIS — I10 Essential (primary) hypertension: Secondary | ICD-10-CM

## 2011-09-22 DIAGNOSIS — E1165 Type 2 diabetes mellitus with hyperglycemia: Secondary | ICD-10-CM

## 2011-09-22 MED ORDER — AMLODIPINE BESYLATE 10 MG PO TABS: 10.0000 mg | ORAL_TABLET | Freq: Every day | ORAL | Status: DC

## 2011-09-22 NOTE — Assessment & Plan Note (Addendum)
Diagnosis made 2007.  Poor control off amlodipine. Pt will restart. Rx called in. She feel very well and currently the condition has not affected her activities of daily living.

## 2011-09-22 NOTE — Progress Notes (Signed)
  Subjective:    Patient ID: Patricia Horne, female    DOB: 1950/12/07, 61 y.o.   MRN: [id number]  HPI   61 year old female presents to have paperwork for adoption completed. The form is for re-certification.   Hypertension:  Poor control, but has not been taking  BP meds in last few days. Using medication without problems or lightheadedness: None Chest pain with exertion: Resolved from last OV 05/2011 Edema:None Short of breath:None Average home BPs: wellc ontrolled when she is compliant. Other issues:  Diabetes:  Poor control.. Due for recheck on A1C. Cannot check today because ate doritos after midnight!  She is taking  100 mg metformin 4 times a weeks. Considering bariatric surgery.  Not taking victoza. Nervous about SE. Considering insulin. She feels great.. more energy. Using medications without difficulties: Hypoglycemic episodes:None Hyperglycemic episodes:yes, one number at 340 in middle of night. Feet problems:None Blood Sugars averaging: fbs: 190, night 250 eye exam within last year: yes    Review of Systems  Constitutional: Negative for fever and fatigue.  HENT: Negative for ear pain.   Eyes: Negative for pain.  Respiratory: Negative for chest tightness and shortness of breath.   Cardiovascular: Negative for chest pain, palpitations and leg swelling.  Gastrointestinal: Negative for abdominal pain.  Genitourinary: Negative for dysuria.       Objective:   Physical Exam  Constitutional: Vital signs are normal. She appears well-developed and well-nourished. She is cooperative.  Non-toxic appearance. She does not appear ill. No distress.  HENT:  Head: Normocephalic.  Right Ear: Hearing, tympanic membrane, external ear and ear canal normal. Tympanic membrane is not erythematous, not retracted and not bulging.  Left Ear: Hearing, tympanic membrane, external ear and ear canal normal. Tympanic membrane is not erythematous, not retracted and not bulging.  Nose: No mucosal  edema or rhinorrhea. Right sinus exhibits no maxillary sinus tenderness and no frontal sinus tenderness. Left sinus exhibits no maxillary sinus tenderness and no frontal sinus tenderness.  Mouth/Throat: Uvula is midline, oropharynx is clear and moist and mucous membranes are normal.  Eyes: Conjunctivae, EOM and lids are normal. Pupils are equal, round, and reactive to light. No foreign bodies found.  Neck: Trachea normal and normal range of motion. Neck supple. Carotid bruit is not present. No mass and no thyromegaly present.  Cardiovascular: Normal rate, regular rhythm, S1 normal, S2 normal, normal heart sounds, intact distal pulses and normal pulses.  Exam reveals no gallop and no friction rub.   No murmur heard. Pulmonary/Chest: Effort normal and breath sounds normal. Not tachypneic. No respiratory distress. She has no decreased breath sounds. She has no wheezes. She has no rhonchi. She has no rales.  Abdominal: Soft. Normal appearance and bowel sounds are normal. There is no tenderness.  Neurological: She is alert.  Skin: Skin is warm, dry and intact. No rash noted.  Psychiatric: Her speech is normal and behavior is normal. Judgment and thought content normal. Her mood appears not anxious. Cognition and memory are normal. She does not exhibit a depressed mood.     Diabetic foot exam: Normal inspection No skin breakdown No calluses  Normal DP pulses Normal sensation to light touch and monofilament Nails normal      Assessment & Plan:

## 2011-09-22 NOTE — Patient Instructions (Addendum)
Take BP medication everyday. Return for labs only visit in next week r so.

## 2011-09-22 NOTE — Assessment & Plan Note (Signed)
Diagnosis made 2007 Poor control, as pt not compliant with medications. Hesitant about trying victoza again.  Today in discussion , pt open to trying insulin. We will check A1C and call with results and instructions. She feel very well and currently the condition has not affected her activities of daily living.

## 2011-09-29 ENCOUNTER — Other Ambulatory Visit (INDEPENDENT_AMBULATORY_CARE_PROVIDER_SITE_OTHER): Payer: BC Managed Care – PPO

## 2011-09-29 DIAGNOSIS — E1165 Type 2 diabetes mellitus with hyperglycemia: Secondary | ICD-10-CM

## 2011-09-29 DIAGNOSIS — I10 Essential (primary) hypertension: Secondary | ICD-10-CM

## 2011-09-29 LAB — COMPREHENSIVE METABOLIC PANEL
Albumin: 3.4 g/dL — ABNORMAL LOW (ref 3.5–5.2)
CO2: 25 mEq/L (ref 19–32)
Calcium: 8.8 mg/dL (ref 8.4–10.5)
GFR: 117.06 mL/min (ref >60.00)
Glucose, Bld: 283 mg/dL — ABNORMAL HIGH (ref 70–99)
Sodium: 135 mEq/L (ref 135–145)
Total Bilirubin: 0.6 mg/dL (ref 0.3–1.2)
Total Protein: 7.2 g/dL (ref 6.0–8.3)

## 2011-09-29 LAB — HEMOGLOBIN A1C: Hgb A1c MFr Bld: 12.4 % — ABNORMAL HIGH (ref 4.6–6.5)

## 2011-09-29 LAB — LIPID PANEL: HDL: 46.1 mg/dL (ref >39.00)

## 2011-10-01 ENCOUNTER — Ambulatory Visit: Payer: Self-pay | Admitting: Family Medicine

## 2011-10-24 ENCOUNTER — Ambulatory Visit (INDEPENDENT_AMBULATORY_CARE_PROVIDER_SITE_OTHER): Payer: BC Managed Care – PPO | Admitting: Family Medicine

## 2011-10-24 ENCOUNTER — Encounter: Payer: Self-pay | Admitting: Family Medicine

## 2011-10-24 VITALS — BP 140/82 | HR 93 | Temp 98.1°F | Ht 62.5 in | Wt 178.5 lb

## 2011-10-24 DIAGNOSIS — E1165 Type 2 diabetes mellitus with hyperglycemia: Secondary | ICD-10-CM

## 2011-10-24 LAB — HM DIABETES FOOT EXAM

## 2011-10-24 MED ORDER — INSULIN GLARGINE 100 UNIT/ML ~~LOC~~ SOLN: 10.0000 [IU] | Freq: Every day | SUBCUTANEOUS | Status: DC

## 2011-10-24 NOTE — Progress Notes (Signed)
  Subjective:    Patient ID: Patricia Horne, female    DOB: [birth year], 61 y.o.   MRN: [id number]  HPI  Diabetes: She is currently on no meds for the past month.. SE to victoza, metformin and glucotrol. Hypoglycemic episodes:? Hyperglycemic episodes:? Feet problems:None Blood Sugars averaging:Not cheking regularly. eye exam within last year:yes  Lab Results  Component Value Date   HGBA1C 12.4* 09/29/2011       Review of Systems  Constitutional: Negative for fever and fatigue.  HENT: Negative for ear pain.   Eyes: Negative for pain.  Respiratory: Negative for chest tightness and shortness of breath.   Cardiovascular: Negative for chest pain, palpitations and leg swelling.  Gastrointestinal: Negative for abdominal pain.  Genitourinary: Negative for dysuria.       Objective:   Physical Exam  Constitutional: Vital signs are normal. She appears well-developed and well-nourished. She is cooperative.  Non-toxic appearance. She does not appear ill. No distress.  HENT:  Head: Normocephalic.  Right Ear: Hearing, tympanic membrane, external ear and ear canal normal. Tympanic membrane is not erythematous, not retracted and not bulging.  Left Ear: Hearing, tympanic membrane, external ear and ear canal normal. Tympanic membrane is not erythematous, not retracted and not bulging.  Nose: No mucosal edema or rhinorrhea. Right sinus exhibits no maxillary sinus tenderness and no frontal sinus tenderness. Left sinus exhibits no maxillary sinus tenderness and no frontal sinus tenderness.  Mouth/Throat: Uvula is midline, oropharynx is clear and moist and mucous membranes are normal.  Eyes: Conjunctivae normal, EOM and lids are normal. Pupils are equal, round, and reactive to light. No foreign bodies found.  Neck: Trachea normal and normal range of motion. Neck supple. Carotid bruit is not present. No mass and no thyromegaly present.  Cardiovascular: Normal rate, regular rhythm, S1 normal, S2 normal,  normal heart sounds, intact distal pulses and normal pulses.  Exam reveals no gallop and no friction rub.   No murmur heard. Pulmonary/Chest: Effort normal and breath sounds normal. Not tachypneic. No respiratory distress. She has no decreased breath sounds. She has no wheezes. She has no rhonchi. She has no rales.  Abdominal: Soft. Normal appearance and bowel sounds are normal. There is no tenderness.  Neurological: She is alert.  Skin: Skin is warm, dry and intact. No rash noted.  Psychiatric: Her speech is normal and behavior is normal. Judgment and thought content normal. Her mood appears not anxious. Cognition and memory are normal. She does not exhibit a depressed mood.          Assessment & Plan:

## 2011-10-24 NOTE — Assessment & Plan Note (Signed)
Insulin start. Stay off all other medications to help with her compliance issues. Total visit time 30 minutes, > 50% spent counseling and cordinating patients care. Discussed insulin, checking blood sugars etc. Encouraged exercise, weight loss, healthy eating habits.

## 2011-10-24 NOTE — Patient Instructions (Addendum)
Check fasting blood sugars daily and 2 hours after meals. Make sure you eat three meals a day., healthy snacks in between.  Work on regular exercise ans weight loss. Start lantus 10 Units daily. Follow up in 2 weeks for medication adjustment, bring blood sugar measurements.  You can try melatonin for sleep, 3 mg at bedtime.

## 2011-11-09 ENCOUNTER — Ambulatory Visit: Payer: BC Managed Care – PPO | Admitting: Family Medicine

## 2011-11-21 ENCOUNTER — Ambulatory Visit (INDEPENDENT_AMBULATORY_CARE_PROVIDER_SITE_OTHER): Payer: BC Managed Care – PPO | Admitting: Family Medicine

## 2011-11-21 ENCOUNTER — Encounter: Payer: Self-pay | Admitting: Family Medicine

## 2011-11-21 VITALS — BP 160/86 | HR 90 | Temp 98.9°F | Resp 20 | Ht 62.5 in | Wt 177.5 lb

## 2011-11-21 DIAGNOSIS — I10 Essential (primary) hypertension: Secondary | ICD-10-CM

## 2011-11-21 DIAGNOSIS — E1165 Type 2 diabetes mellitus with hyperglycemia: Secondary | ICD-10-CM

## 2011-11-21 NOTE — Assessment & Plan Note (Signed)
Restart amlodipine, previously well controlled.

## 2011-11-21 NOTE — Patient Instructions (Addendum)
Increase the Lantus to 20 units at bedtime. Check blood sugars as you have been. Restart amlodipine.  Return in 2 weeks , BRING BLOOD SUGAR MEASUREMNTS.

## 2011-11-21 NOTE — Progress Notes (Signed)
  Subjective:    Patient ID: Patricia Horne, female    DOB: [birth year], 61 y.o.   MRN: [id number]  HPI  DM  2 week follow up after insulin start. Pt is currently taking lantus 10 Units at beditme.  She has stopped metformin.  Fasting blood sugars 214-394. 2 hours after meals 407 Occ 491 at bedtime.  She is not taking amlodipine and BPs are elevated today.   Vaginal burning of and on in last few weeks. No itching or discharge.  Occ buring in shoulders and arms.  Some numbness in feet. Review of Systems  Constitutional: Negative for fever.  HENT: Negative for neck stiffness.   Respiratory: Negative for cough and shortness of breath.   Cardiovascular: Negative for chest pain and leg swelling.       Objective:   Physical Exam  Constitutional: She is oriented to person, place, and time. She appears well-developed and well-nourished.  Neck: Normal range of motion. Neck supple. No thyromegaly present.  Cardiovascular: Normal rate and regular rhythm.   No murmur heard. Pulmonary/Chest: Effort normal and breath sounds normal.  Neurological: She is alert and oriented to person, place, and time.  Skin: Skin is warm and dry. No rash noted.  Psychiatric: She has a normal mood and affect.          Assessment & Plan:

## 2011-11-21 NOTE — Assessment & Plan Note (Signed)
Most significantly pt is ACTUALLY compliant with the insulin. Pt congratulated. Increase very slowly to 20 units daily , but expect 80-100 units will be needed. To help with compliance pt will come in in 2 weeks for appt to reassess and adjust the medication. And for encouragement.  Doubt the numbness is from Lantus but instead from poorly controlled DM.

## 2011-12-05 ENCOUNTER — Ambulatory Visit (INDEPENDENT_AMBULATORY_CARE_PROVIDER_SITE_OTHER): Payer: BC Managed Care – PPO | Admitting: Family Medicine

## 2011-12-05 ENCOUNTER — Encounter: Payer: Self-pay | Admitting: Family Medicine

## 2011-12-05 VITALS — BP 140/86 | HR 80 | Temp 98.4°F | Wt 179.2 lb

## 2011-12-05 DIAGNOSIS — E1165 Type 2 diabetes mellitus with hyperglycemia: Secondary | ICD-10-CM

## 2011-12-05 DIAGNOSIS — I10 Essential (primary) hypertension: Secondary | ICD-10-CM

## 2011-12-05 DIAGNOSIS — R5383 Other fatigue: Secondary | ICD-10-CM

## 2011-12-05 DIAGNOSIS — Z1231 Encounter for screening mammogram for malignant neoplasm of breast: Secondary | ICD-10-CM

## 2011-12-05 DIAGNOSIS — R5381 Other malaise: Secondary | ICD-10-CM

## 2011-12-05 MED ORDER — METFORMIN HCL 500 MG PO TABS: 500.0000 mg | ORAL_TABLET | Freq: Two times a day (BID) | ORAL | Status: DC

## 2011-12-05 NOTE — Progress Notes (Signed)
  Subjective:    Patient ID: Patricia Horne, female    DOB: January 12, 1951, 61 y.o.   MRN: [id number]  HPI  61 year old female presents for 2 week follow up after titration of insulin for diabetes. She is now on 20 Units of Lantus at bedtime. Using medications without difficulties: She has been feeling fatigued in last few weeks. She is still having burning feeling inside body all over. Hypoglycemic episodes:None Hyperglycemic episodes: Feet problems: No ulcers Blood Sugars averaging: 193-270, one number at 143..didn't eat much the night before that day.  Lab Results  Component Value Date   HGBA1C 12.4* 09/29/2011   Still drinking sweet tea. Not eating bread, eats a lot of potato.  Still having dry mouth an bad taste in muth issues even off metfromn. She would like to restart it. BP Readings from Last 3 Encounters:  12/05/11 140/86  11/21/11 160/86  10/24/11 140/82   Wt Readings from Last 3 Encounters:  12/05/11 179 lb 4 oz (81.307 kg)  11/21/11 177 lb 8 oz (80.513 kg)  10/24/11 178 lb 8 oz (80.967 kg)      Review of Systems  Constitutional: Positive for fatigue. Negative for fever.  HENT: Negative for ear pain.   Eyes: Negative for pain.  Respiratory: Negative for chest tightness and shortness of breath.   Cardiovascular: Negative for chest pain, palpitations and leg swelling.  Gastrointestinal: Negative for abdominal pain.  Genitourinary: Negative for dysuria.       Objective:   Physical Exam  Constitutional: Vital signs are normal. She appears well-developed and well-nourished. She is cooperative.  Non-toxic appearance. She does not appear ill. No distress.  HENT:  Head: Normocephalic.  Right Ear: Hearing, tympanic membrane, external ear and ear canal normal. Tympanic membrane is not erythematous, not retracted and not bulging.  Left Ear: Hearing, tympanic membrane, external ear and ear canal normal. Tympanic membrane is not erythematous, not retracted and not bulging.    Nose: No mucosal edema or rhinorrhea. Right sinus exhibits no maxillary sinus tenderness and no frontal sinus tenderness. Left sinus exhibits no maxillary sinus tenderness and no frontal sinus tenderness.  Mouth/Throat: Uvula is midline, oropharynx is clear and moist and mucous membranes are normal.  Eyes: Conjunctivae normal, EOM and lids are normal. Pupils are equal, round, and reactive to light. No foreign bodies found.  Neck: Trachea normal and normal range of motion. Neck supple. Carotid bruit is not present. No mass and no thyromegaly present.  Cardiovascular: Normal rate, regular rhythm, S1 normal, S2 normal, normal heart sounds, intact distal pulses and normal pulses.  Exam reveals no gallop and no friction rub.   No murmur heard. Pulmonary/Chest: Effort normal and breath sounds normal. Not tachypneic. No respiratory distress. She has no decreased breath sounds. She has no wheezes. She has no rhonchi. She has no rales.  Abdominal: Soft. Normal appearance and bowel sounds are normal. There is no tenderness.  Neurological: She is alert.  Skin: Skin is warm, dry and intact. No rash noted.  Psychiatric: Her speech is normal and behavior is normal. Judgment and thought content normal. Her mood appears not anxious. Cognition and memory are normal. She does not exhibit a depressed mood.    Diabetic foot exam: Normal inspection No skin breakdown No calluses  Normal DP pulses Normal sensation to light touch and monofilament Nails normal       Assessment & Plan:

## 2011-12-05 NOTE — Assessment & Plan Note (Signed)
Multifactorial. Discussed likely not due to  LAntus but may be due to blood sugars being high or change toward normal. Dry motuh also may be due to dehydration from elevated glucose.

## 2011-12-05 NOTE — Assessment & Plan Note (Signed)
Discussed diet further. Increase exercise to help with energy.  Increase Lantus to 30 Units daily.. Discussed pushing through any change in the way she feels due to normalizing blood sugars. She will feel better when she gets used to them.  Restart metformin (pt would like to) at low dose given past SE. She wants to restart this med do to mild constipation off it . We discussed other options to treat constipation, but she wants to get her blood sugars down and is willing to restart it.

## 2011-12-05 NOTE — Assessment & Plan Note (Signed)
Borderline control, continue on current regimen but may need to increase once we get DM under better control. To assist with compliance with meds we will not make too many med changes at once. BP Readings from Last 3 Encounters:  12/05/11 140/86  11/21/11 160/86  10/24/11 140/82

## 2011-12-05 NOTE — Patient Instructions (Addendum)
Stop sweet tea and potatos. Increase lantus to 30 Units. Okay to restart metformin at 500 mg twice a day. Follow up in 2 weeks.. Bring in blood sugar measurements.  Also make appt for CPX with fasting labs prior in 1 month. Stop at front desk to set up mammogram.

## 2011-12-21 ENCOUNTER — Ambulatory Visit (INDEPENDENT_AMBULATORY_CARE_PROVIDER_SITE_OTHER): Payer: BC Managed Care – PPO | Admitting: Family Medicine

## 2011-12-21 ENCOUNTER — Encounter: Payer: Self-pay | Admitting: Family Medicine

## 2011-12-21 VITALS — BP 130/84 | HR 92 | Temp 98.2°F | Ht 62.5 in | Wt 179.8 lb

## 2011-12-21 DIAGNOSIS — E1165 Type 2 diabetes mellitus with hyperglycemia: Secondary | ICD-10-CM

## 2011-12-21 MED ORDER — INSULIN DETEMIR 100 UNIT/ML ~~LOC~~ SOLN: 30.0000 [IU] | Freq: Every day | SUBCUTANEOUS | Status: DC

## 2011-12-21 NOTE — Assessment & Plan Note (Signed)
Blood sugars are coming down on insulin and metformin but pt not able to tolerate meds Unusual SE to lantus... ? Secondary to lower  Glucose as opposed to med SE? Will try levemir.  Marland Kitchen

## 2011-12-21 NOTE — Patient Instructions (Addendum)
Continue metfromin.  We will change to levemir instead of lantus to see if less SE.  Call if you have burning SE with this insulin .Marland Kitchen We can do a trial of gabapentin for neuropathy

## 2011-12-21 NOTE — Progress Notes (Signed)
  Subjective:    Patient ID: Patricia Horne, female    DOB: 1950/02/04, 61 y.o.   MRN: [id number]  HPI 61 year old female presents for 2 week follow up after titration of insulin for diabetes.  She is now on 30 Units of Lantus at bedtime.  Restarted metformin.  Burning feeling inside body all over worse in last few week, intolerable.  Stopped insulin.. Symptom resolved. Hypoglycemic episodes:None  Hyperglycemic episodes: Many Feet problems: No ulcers  Blood Sugars averaging: 176-186 when on 30 insulin and metformin. When back off 255-304    Lab Results   Component  Value  Date    HGBA1C  12.4*  09/29/2011    Still drinking sweet tea, but has decreased. Not eating bread, eats a lot of potato.   no exercise since feeling bad.      Review of Systems  Constitutional: Positive for fatigue. Negative for fever.  Cardiovascular: Negative for chest pain, palpitations and leg swelling.  Gastrointestinal: Negative for abdominal pain.  Musculoskeletal: Positive for back pain.       Objective:   Physical Exam  Constitutional: Vital signs are normal. She appears well-developed and well-nourished. She is cooperative.  Non-toxic appearance. She does not appear ill. No distress.  HENT:  Head: Normocephalic.  Right Ear: Hearing, tympanic membrane, external ear and ear canal normal. Tympanic membrane is not erythematous, not retracted and not bulging.  Left Ear: Hearing, tympanic membrane, external ear and ear canal normal. Tympanic membrane is not erythematous, not retracted and not bulging.  Nose: No mucosal edema or rhinorrhea. Right sinus exhibits no maxillary sinus tenderness and no frontal sinus tenderness. Left sinus exhibits no maxillary sinus tenderness and no frontal sinus tenderness.  Mouth/Throat: Uvula is midline, oropharynx is clear and moist and mucous membranes are normal.  Eyes: Conjunctivae normal, EOM and lids are normal. Pupils are equal, round, and reactive to light. No foreign  bodies found.  Neck: Trachea normal and normal range of motion. Neck supple. Carotid bruit is not present. No mass and no thyromegaly present.  Cardiovascular: Normal rate, regular rhythm, S1 normal, S2 normal, normal heart sounds, intact distal pulses and normal pulses.  Exam reveals no gallop and no friction rub.   No murmur heard. Pulmonary/Chest: Effort normal and breath sounds normal. Not tachypneic. No respiratory distress. She has no decreased breath sounds. She has no wheezes. She has no rhonchi. She has no rales.  Abdominal: Soft. Normal appearance and bowel sounds are normal. There is no tenderness.  Neurological: She is alert.  Skin: Skin is warm, dry and intact. No rash noted.  Psychiatric: Her speech is normal and behavior is normal. Judgment and thought content normal. Her mood appears not anxious. Cognition and memory are normal. She does not exhibit a depressed mood.          Assessment & Plan:

## 2011-12-26 ENCOUNTER — Ambulatory Visit: Payer: Self-pay | Admitting: Family Medicine

## 2011-12-27 ENCOUNTER — Encounter: Payer: Self-pay | Admitting: Family Medicine

## 2011-12-29 ENCOUNTER — Encounter: Payer: Self-pay | Admitting: *Deleted

## 2012-01-12 ENCOUNTER — Encounter: Payer: Self-pay | Admitting: Family Medicine

## 2012-01-12 ENCOUNTER — Ambulatory Visit (INDEPENDENT_AMBULATORY_CARE_PROVIDER_SITE_OTHER): Payer: BC Managed Care – PPO | Admitting: Family Medicine

## 2012-01-12 VITALS — BP 120/72 | HR 98 | Temp 98.3°F | Ht 62.5 in | Wt 179.5 lb

## 2012-01-12 DIAGNOSIS — E1165 Type 2 diabetes mellitus with hyperglycemia: Secondary | ICD-10-CM

## 2012-01-12 NOTE — Progress Notes (Signed)
  Subjective:    Patient ID: Patricia Horne, female    DOB: 1950/03/02, 61 y.o.   MRN: [id number]  HPI 61 year old female presents for 2 week follow up  Burning feeling inside body all over from lantus. She never tried levemir. Stopped insulin.. Symptom resolved.  Cannot toleated SE of metformin either. Hypoglycemic episodes:None  Hyperglycemic episodes: Many  Feet problems: No ulcers  Blood Sugars averaging:  WAS 176-186 when on 30 insulin and metformin. When back off 255-304.  Currently not checking AT ALL. Lab Results   Component  Value  Date    HGBA1C  12.4*  09/29/2011   Still drinking sweet tea, but has decreased. Not eating bread, eats a lot of potato.  No exercise since feeling bad.   More fatigued off all DM meds.  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       Review of Systems  Constitutional: Positive for fatigue. Negative for fever.  HENT: Negative for ear pain.   Eyes: Negative for pain.  Respiratory: Negative for chest tightness and shortness of breath.   Cardiovascular: Negative for chest pain, palpitations and leg swelling.  Gastrointestinal: Negative for abdominal pain.  Genitourinary: Negative for dysuria.       Objective:   Physical Exam  Constitutional: Vital signs are normal. She appears well-developed and well-nourished. She is cooperative.  Non-toxic appearance. She does not appear ill. No distress.  HENT:  Head: Normocephalic.  Right Ear: Hearing, tympanic membrane, external ear and ear canal normal. Tympanic membrane is not erythematous, not retracted and not bulging.  Left Ear: Hearing, tympanic membrane, external ear and ear canal normal. Tympanic membrane is not erythematous, not retracted and not bulging.  Nose: No mucosal edema or rhinorrhea. Right sinus exhibits no maxillary sinus tenderness and no frontal sinus tenderness. Left sinus exhibits no maxillary sinus tenderness and no frontal sinus tenderness.  Mouth/Throat: Uvula is midline,  oropharynx is clear and moist and mucous membranes are normal.  Eyes: Conjunctivae normal, EOM and lids are normal. Pupils are equal, round, and reactive to light. No foreign bodies found.  Neck: Trachea normal and normal range of motion. Neck supple. Carotid bruit is not present. No mass and no thyromegaly present.  Cardiovascular: Normal rate, regular rhythm, S1 normal, S2 normal, normal heart sounds, intact distal pulses and normal pulses.  Exam reveals no gallop and no friction rub.   No murmur heard. Pulmonary/Chest: Effort normal and breath sounds normal. Not tachypneic. No respiratory distress. She has no decreased breath sounds. She has no wheezes. She has no rhonchi. She has no rales.  Abdominal: Soft. Normal appearance and bowel sounds are normal. There is no tenderness.  Neurological: She is alert.  Skin: Skin is warm, dry and intact. No rash noted.  Psychiatric: Her speech is normal and behavior is normal. Judgment and thought content normal. Her mood appears not anxious. Cognition and memory are normal. She does not exhibit a depressed mood.          Assessment & Plan:

## 2012-01-12 NOTE — Patient Instructions (Addendum)
Consider bariatric surgery info session.  Start the levemir  30 Units daily for a trial to see if Side effects different.  Follow up in 2 weeks.

## 2012-01-12 NOTE — Assessment & Plan Note (Signed)
Not on any DM me. She is open to trying levemir. Will follow up in 2 weeks.

## 2012-01-29 ENCOUNTER — Telehealth: Payer: Self-pay | Admitting: Family Medicine

## 2012-01-29 NOTE — Telephone Encounter (Signed)
Caller: [name]/Patient; Phone: [phone]; Reason for Call: Cancelling appt 01/30/12; states selected "cancel" on automated line when reminder call came in 01/26/12 but did not hear back from office to reschedule.  Appt rescheduled per patient request for follow up appt 02/13/11 0945 with Dr.  Ermalene Searing.

## 2012-01-30 ENCOUNTER — Ambulatory Visit: Payer: BC Managed Care – PPO | Admitting: Family Medicine

## 2012-02-13 ENCOUNTER — Encounter: Payer: Self-pay | Admitting: Family Medicine

## 2012-02-13 ENCOUNTER — Ambulatory Visit (INDEPENDENT_AMBULATORY_CARE_PROVIDER_SITE_OTHER): Payer: BC Managed Care – PPO | Admitting: Family Medicine

## 2012-02-13 VITALS — BP 130/90 | HR 100 | Temp 97.9°F | Ht 62.5 in | Wt 176.0 lb

## 2012-02-13 DIAGNOSIS — R8290 Unspecified abnormal findings in urine: Secondary | ICD-10-CM | POA: Insufficient documentation

## 2012-02-13 DIAGNOSIS — IMO0001 Reserved for inherently not codable concepts without codable children: Secondary | ICD-10-CM

## 2012-02-13 DIAGNOSIS — R82998 Other abnormal findings in urine: Secondary | ICD-10-CM

## 2012-02-13 DIAGNOSIS — E1165 Type 2 diabetes mellitus with hyperglycemia: Secondary | ICD-10-CM

## 2012-02-13 DIAGNOSIS — I10 Essential (primary) hypertension: Secondary | ICD-10-CM

## 2012-02-13 LAB — POCT URINALYSIS DIPSTICK
Protein, UA: NEGATIVE
Spec Grav, UA: 1.025
Urobilinogen, UA: NEGATIVE
pH, UA: 5

## 2012-02-13 LAB — BASIC METABOLIC PANEL
BUN: 15 mg/dL (ref 6–23)
CO2: 27 mEq/L (ref 19–32)
Calcium: 9.2 mg/dL (ref 8.4–10.5)
Creatinine, Ser: 0.8 mg/dL (ref 0.4–1.2)

## 2012-02-13 LAB — HEMOGLOBIN A1C: Hgb A1c MFr Bld: 12.6 % — ABNORMAL HIGH (ref 4.6–6.5)

## 2012-02-13 NOTE — Assessment & Plan Note (Signed)
Continued poor control. Counseled pts on complications of DM.  All symptoms likely due to poor control sugars.  Encouraged pt to try levemir. Offered referral to [facility] but we both feel they would likely recommend insulin. If she has SE to levemir... Then we will refer to endo for other options.

## 2012-02-13 NOTE — Progress Notes (Signed)
  Subjective:    Patient ID: Patricia Horne, female    DOB: August 17, 1950, 62 y.o.   MRN: [id number]  HPI 62 year old female presents for 2 week follow up  Burning feeling inside body all over from lantus.  Stopped insulin.. Symptom resolved.  Cannot tolerate SE of metformin either.  Levemir: Still has not tried. [place] to hard for her to take twice a day  Hypoglycemic episodes:None  Hyperglycemic episodes: Many  Feet problems: No ulcers  Blood Sugars averaging: WAS 176-186 when on 30 insulin and metformin. When back off 255-304. Currently not checking AT ALL.   Lab Results   Component  Value  Date    HGBA1C  12.4*  09/29/2011    Still drinking sweet tea, but has decreased. Not eating bread, eats a lot of potato.  Doing more exercise. Feeling better overall.   Hypertension:   Has not taken medication this AM yet. Using medication without problems or lightheadedness:  None Chest pain with exertion: None Edema:None Short of breath:None Average home BPs:  Not checking Other issues:     Review of Systems  Constitutional: Negative for fever and fatigue.  HENT: Negative for ear pain.   Eyes: Negative for pain.  Respiratory: Negative for chest tightness and shortness of breath.   Cardiovascular: Negative for chest pain, palpitations and leg swelling.  Gastrointestinal: Negative for abdominal pain.  Genitourinary: Negative for dysuria.       Urine frequency and odor       Objective:   Physical Exam  Constitutional: Vital signs are normal. She appears well-developed and well-nourished. She is cooperative.  Non-toxic appearance. She does not appear ill. No distress.  HENT:  Head: Normocephalic.  Right Ear: Hearing, tympanic membrane, external ear and ear canal normal. Tympanic membrane is not erythematous, not retracted and not bulging.  Left Ear: Hearing, tympanic membrane, external ear and ear canal normal. Tympanic membrane is not erythematous, not retracted and not bulging.    Nose: No mucosal edema or rhinorrhea. Right sinus exhibits no maxillary sinus tenderness and no frontal sinus tenderness. Left sinus exhibits no maxillary sinus tenderness and no frontal sinus tenderness.  Mouth/Throat: Uvula is midline, oropharynx is clear and moist and mucous membranes are normal.  Eyes: Conjunctivae normal, EOM and lids are normal. Pupils are equal, round, and reactive to light. No foreign bodies found.  Neck: Trachea normal and normal range of motion. Neck supple. Carotid bruit is not present. No mass and no thyromegaly present.  Cardiovascular: Normal rate, regular rhythm, S1 normal, S2 normal, normal heart sounds, intact distal pulses and normal pulses.  Exam reveals no gallop and no friction rub.   No murmur heard. Pulmonary/Chest: Effort normal and breath sounds normal. Not tachypneic. No respiratory distress. She has no decreased breath sounds. She has no wheezes. She has no rhonchi. She has no rales.  Abdominal: Soft. Normal appearance and bowel sounds are normal. There is no tenderness.  Neurological: She is alert.  Skin: Skin is warm, dry and intact. No rash noted.  Psychiatric: Her speech is normal and behavior is normal. Judgment and thought content normal. Her mood appears not anxious. Cognition and memory are normal. She does not exhibit a depressed mood.          Assessment & Plan:

## 2012-02-13 NOTE — Patient Instructions (Addendum)
Stop at lab on your way out.  Try levemir, start at 30 units daily. Can restart metformin if open to this. Follow up in 2 weeks.

## 2012-02-13 NOTE — Assessment & Plan Note (Signed)
Likely controlled when taking BP med. Recommended pt to follow.

## 2012-02-27 ENCOUNTER — Ambulatory Visit (INDEPENDENT_AMBULATORY_CARE_PROVIDER_SITE_OTHER): Payer: BC Managed Care – PPO | Admitting: Family Medicine

## 2012-02-27 ENCOUNTER — Other Ambulatory Visit (INDEPENDENT_AMBULATORY_CARE_PROVIDER_SITE_OTHER): Payer: BC Managed Care – PPO

## 2012-02-27 ENCOUNTER — Encounter: Payer: Self-pay | Admitting: Family Medicine

## 2012-02-27 ENCOUNTER — Telehealth: Payer: Self-pay | Admitting: Family Medicine

## 2012-02-27 VITALS — BP 120/84 | HR 79 | Temp 98.4°F | Ht 62.5 in | Wt 177.1 lb

## 2012-02-27 DIAGNOSIS — I10 Essential (primary) hypertension: Secondary | ICD-10-CM

## 2012-02-27 DIAGNOSIS — E1165 Type 2 diabetes mellitus with hyperglycemia: Secondary | ICD-10-CM

## 2012-02-27 LAB — COMPREHENSIVE METABOLIC PANEL
ALT: 13 U/L (ref 0–35)
AST: 14 U/L (ref 0–37)
Albumin: 3.5 g/dL (ref 3.5–5.2)
Alkaline Phosphatase: 95 U/L (ref 39–117)
BUN: 13 mg/dL (ref 6–23)
Calcium: 8.8 mg/dL (ref 8.4–10.5)
Chloride: 103 mEq/L (ref 96–112)
Potassium: 3.8 mEq/L (ref 3.5–5.1)
Sodium: 137 mEq/L (ref 135–145)
Total Protein: 7.3 g/dL (ref 6.0–8.3)

## 2012-02-27 LAB — LIPID PANEL
Cholesterol: 138 mg/dL (ref 0–200)
LDL Cholesterol: 79 mg/dL (ref 0–99)
VLDL: 18 mg/dL (ref 0.0–40.0)

## 2012-02-27 NOTE — Telephone Encounter (Signed)
Message copied by Excell Seltzer on Tue Feb 27, 2012  8:11 AM ------      Message from: Alvina Chou      Created: Mon Feb 26, 2012  4:24 PM      Regarding: lab orders for Tuesday, 1.28.14       Patient is scheduled for CPX labs, please order future labs, Thanks , Camelia Eng

## 2012-02-27 NOTE — Patient Instructions (Addendum)
Increase levemir to 40 Units daily. If you feel ill, don't stop the levemir  but could hold the metformin. Ideally stay on both. Keep in mind the longer you stay on these meds..the better you feel. Keep appt for CPX on 2/7.

## 2012-02-27 NOTE — Progress Notes (Signed)
  Subjective:    Patient ID: Patricia Horne, female    DOB: 06/14/1950, 62 y.o.   MRN: [id number]  HPI  Diabetes: Poor control recently, but she has been taking levemir 30 Units and metformin. Using medications without difficulties: She feels ill on these meds. Nausea overall.  Does not have a burning all over with the levemir. Hypoglycemic episodes:None Hyperglycemic episodes: Yes Feet problems: none Blood Sugars averaging: FBS 260   Hypertension:   Well controlled taking medication.  Using medication without problems or lightheadedness:  Chest pain with exertion: Edema: Short of breath: Average home BPs: Other issues:    Review of Systems  Constitutional: Negative for fever and fatigue.  HENT: Negative for ear pain.   Eyes: Negative for pain.  Respiratory: Negative for chest tightness and shortness of breath.   Cardiovascular: Negative for chest pain, palpitations and leg swelling.  Gastrointestinal: Negative for abdominal pain.  Genitourinary: Negative for dysuria.       Objective:   Physical Exam  Constitutional: Vital signs are normal. She appears well-developed and well-nourished. She is cooperative.  Non-toxic appearance. She does not appear ill. No distress.  HENT:  Head: Normocephalic.  Right Ear: Hearing, tympanic membrane, external ear and ear canal normal. Tympanic membrane is not erythematous, not retracted and not bulging.  Left Ear: Hearing, tympanic membrane, external ear and ear canal normal. Tympanic membrane is not erythematous, not retracted and not bulging.  Nose: No mucosal edema or rhinorrhea. Right sinus exhibits no maxillary sinus tenderness and no frontal sinus tenderness. Left sinus exhibits no maxillary sinus tenderness and no frontal sinus tenderness.  Mouth/Throat: Uvula is midline, oropharynx is clear and moist and mucous membranes are normal.  Eyes: Conjunctivae normal, EOM and lids are normal. Pupils are equal, round, and reactive to light. No  foreign bodies found.  Neck: Trachea normal and normal range of motion. Neck supple. Carotid bruit is not present. No mass and no thyromegaly present.  Cardiovascular: Normal rate, regular rhythm, S1 normal, S2 normal, normal heart sounds, intact distal pulses and normal pulses.  Exam reveals no gallop and no friction rub.   No murmur heard. Pulmonary/Chest: Effort normal and breath sounds normal. Not tachypneic. No respiratory distress. She has no decreased breath sounds. She has no wheezes. She has no rhonchi. She has no rales.  Abdominal: Soft. Normal appearance and bowel sounds are normal. There is no tenderness.  Neurological: She is alert.  Skin: Skin is warm, dry and intact. No rash noted.  Psychiatric: Her speech is normal and behavior is normal. Judgment and thought content normal. Her mood appears not anxious. Cognition and memory are normal. She does not exhibit a depressed mood.          Assessment & Plan:

## 2012-02-27 NOTE — Assessment & Plan Note (Addendum)
Increase Levemir 40 Units.  Pt encouraged and congratulated. Tolerating levemir better than lantus. Try ginger for nausea.

## 2012-02-27 NOTE — Assessment & Plan Note (Signed)
Well controlled. Continue current medication.  

## 2012-03-01 ENCOUNTER — Other Ambulatory Visit: Payer: BC Managed Care – PPO

## 2012-03-08 ENCOUNTER — Encounter: Payer: Self-pay | Admitting: Family Medicine

## 2012-03-08 ENCOUNTER — Ambulatory Visit (INDEPENDENT_AMBULATORY_CARE_PROVIDER_SITE_OTHER): Payer: BC Managed Care – PPO | Admitting: Family Medicine

## 2012-03-08 VITALS — BP 120/82 | HR 85 | Temp 98.5°F | Ht 62.5 in | Wt 175.5 lb

## 2012-03-08 DIAGNOSIS — E1165 Type 2 diabetes mellitus with hyperglycemia: Secondary | ICD-10-CM

## 2012-03-08 DIAGNOSIS — Z0000 Encounter for general adult medical examination without abnormal findings: Secondary | ICD-10-CM

## 2012-03-08 DIAGNOSIS — L820 Inflamed seborrheic keratosis: Secondary | ICD-10-CM

## 2012-03-08 DIAGNOSIS — I10 Essential (primary) hypertension: Secondary | ICD-10-CM

## 2012-03-08 NOTE — Progress Notes (Signed)
Subjective:    Patient ID: Patricia Horne, female    DOB: [birth year], 62 y.o.   MRN: [id number]  HPI The patient is here for annual wellness exam and preventative care.    Diabetes:  On levemir 30 Units now. Not taking metfomrin right now.  No SE at this time. Doing well taking med everyday. Using medications without difficulties: Hypoglycemic episodes:None Hyperglycemic episodes: yes Feet problems:None Blood Sugars averaging: 240-271  Wt Readings from Last 3 Encounters:  03/08/12 175 lb 8 oz (79.606 kg)  02/27/12 177 lb 1.9 oz (80.341 kg)  02/13/12 176 lb (79.833 kg)     Hypertension:   Well controlled on amlodipine Using medication without problems or lightheadedness:  None Chest pain with exertion:None Edema:None Short of breath:None Average home BPs: not checking Other issues:    Review of Systems  Constitutional: Negative for fever and fatigue.  HENT: Negative for ear pain.   Eyes: Negative for pain.  Respiratory: Negative for chest tightness and shortness of breath.   Cardiovascular: Negative for chest pain, palpitations and leg swelling.  Gastrointestinal: Negative for abdominal pain.  Genitourinary: Negative for dysuria.       Objective:   Physical Exam  Constitutional: Vital signs are normal. She appears well-developed and well-nourished. She is cooperative.  Non-toxic appearance. She does not appear ill. No distress.  HENT:  Head: Normocephalic.  Right Ear: Hearing, tympanic membrane, external ear and ear canal normal.  Left Ear: Hearing, tympanic membrane, external ear and ear canal normal.  Nose: Nose normal.  Eyes: Conjunctivae normal, EOM and lids are normal. Pupils are equal, round, and reactive to light. No foreign bodies found.  Neck: Trachea normal and normal range of motion. Neck supple. Carotid bruit is not present. No mass and no thyromegaly present.  Cardiovascular: Normal rate, regular rhythm, S1 normal, S2 normal, normal heart sounds and intact  distal pulses.  Exam reveals no gallop.   No murmur heard. Pulmonary/Chest: Effort normal and breath sounds normal. No respiratory distress. She has no wheezes. She has no rhonchi. She has no rales.  Abdominal: Soft. Normal appearance and bowel sounds are normal. She exhibits no distension, no fluid wave, no abdominal bruit and no mass. There is no hepatosplenomegaly. There is no tenderness. There is no rebound, no guarding and no CVA tenderness. No hernia.  Genitourinary: No breast swelling, tenderness, discharge or bleeding.  Lymphadenopathy:    She has no cervical adenopathy.    She has no axillary adenopathy.  Neurological: She is alert. She has normal strength. No cranial nerve deficit or sensory deficit.  Skin: Skin is warm, dry and intact. No rash noted.  Psychiatric: Her speech is normal and behavior is normal. Judgment normal. Her mood appears not anxious. Cognition and memory are normal. She does not exhibit a depressed mood.   sk on right chest wall, begnign in appearance, irritated per pt, by bra given at bra line.  Diabetic foot exam: Normal inspection No skin breakdown No calluses  Normal DP pulses Normal sensation to light touch and monofilament Nails normal       Assessment & Plan:  The patient's preventative maintenance and recommended screening tests for an annual wellness exam were reviewed in full today. Brought up to date unless services declined.  Counselled on the importance of diet, exercise, and its role in overall health and mortality. The patient's FH and SH was reviewed, including their home life, tobacco status, and drug and alcohol status.   Vaccine:Uptodate with Td, due for  PNA, refused. Flu in 10/2011 PAP/DVE: TAH, not indicated. Having some vaginal dryness. Plans to try KY jelly Mammo:11/13 DEXA; 2011 nml, repeat in 5 years Colon: 2010 per pt nml... Repeat in 10 years. Nonsmoker

## 2012-03-08 NOTE — Assessment & Plan Note (Signed)
Well-controlled on current regimen. ?

## 2012-03-08 NOTE — Patient Instructions (Signed)
Increase Levemir to 40 Units daily. Try to eat less sweet fruits. Avoid potatos. Try not to miss meals. Follow up in 3 weeks.

## 2012-03-08 NOTE — Assessment & Plan Note (Signed)
Poor control, but pt commended on taking med daily. This is big step for her.  increase Levemir to 40 mg daily.  Counseled on further diet change.

## 2012-03-16 ENCOUNTER — Other Ambulatory Visit: Payer: Self-pay

## 2012-03-29 ENCOUNTER — Ambulatory Visit (INDEPENDENT_AMBULATORY_CARE_PROVIDER_SITE_OTHER): Payer: BC Managed Care – PPO | Admitting: Family Medicine

## 2012-03-29 ENCOUNTER — Encounter: Payer: Self-pay | Admitting: Family Medicine

## 2012-03-29 VITALS — BP 140/70 | HR 82 | Temp 98.1°F | Ht 62.5 in | Wt 175.8 lb

## 2012-03-29 DIAGNOSIS — IMO0001 Reserved for inherently not codable concepts without codable children: Secondary | ICD-10-CM

## 2012-03-29 DIAGNOSIS — B372 Candidiasis of skin and nail: Secondary | ICD-10-CM | POA: Insufficient documentation

## 2012-03-29 DIAGNOSIS — I10 Essential (primary) hypertension: Secondary | ICD-10-CM

## 2012-03-29 MED ORDER — NYSTATIN 100000 UNIT/GM EX CREA: TOPICAL_CREAM | Freq: Two times a day (BID) | CUTANEOUS | Status: DC

## 2012-03-29 NOTE — Assessment & Plan Note (Signed)
Borderline control on current meds.

## 2012-03-29 NOTE — Patient Instructions (Addendum)
Follow up in 2 weeks for DM check with fasting labs prior. Increase levemir 45. Try trial of Alli.  Start exercise. Nystatin cream for interttigo. Continue working on diet and lifestyle change.

## 2012-03-29 NOTE — Assessment & Plan Note (Signed)
Inadequate control... But she is taking the insulin!!!  Increase levemir to 45 units daily. Follow CBGs closely.  Check A1C before follow up in 2 weeks.

## 2012-03-29 NOTE — Progress Notes (Signed)
  Subjective:    Patient ID: Patricia Horne, female    DOB: July 03, 1950, 62 y.o.   MRN: [id number]  HPI Diabetes: On levemir 40 Units now. Not taking metformin right now.  No SE at this time. Doing well taking med everyday.  Using medications without difficulties:  Hypoglycemic episodes: None  Hyperglycemic episodes: NONE Feet problems:None  Blood Sugars averaging: 141-170  Working hard on diet changes.  Lab Results  Component Value Date   HGBA1C 12.6* 02/13/2012   Wt Readings from Last 3 Encounters:  03/29/12 175 lb 12 oz (79.72 kg)  03/08/12 175 lb 8 oz (79.606 kg)  02/27/12 177 lb 1.9 oz (80.341 kg)       Review of Systems  Constitutional: Negative for fever and fatigue.  HENT: Negative for ear pain.   Eyes: Negative for pain.  Respiratory: Negative for chest tightness and shortness of breath.   Cardiovascular: Negative for chest pain, palpitations and leg swelling.  Gastrointestinal: Negative for abdominal pain.  Genitourinary: Negative for dysuria.       Objective:   Physical Exam  Constitutional: Vital signs are normal. She appears well-developed and well-nourished. She is cooperative.  Non-toxic appearance. She does not appear ill. No distress.  HENT:  Head: Normocephalic.  Right Ear: Hearing, tympanic membrane, external ear and ear canal normal. Tympanic membrane is not erythematous, not retracted and not bulging.  Left Ear: Hearing, tympanic membrane, external ear and ear canal normal. Tympanic membrane is not erythematous, not retracted and not bulging.  Nose: No mucosal edema or rhinorrhea. Right sinus exhibits no maxillary sinus tenderness and no frontal sinus tenderness. Left sinus exhibits no maxillary sinus tenderness and no frontal sinus tenderness.  Mouth/Throat: Uvula is midline, oropharynx is clear and moist and mucous membranes are normal.  Eyes: Conjunctivae, EOM and lids are normal. Pupils are equal, round, and reactive to light. No foreign bodies found.   Neck: Trachea normal and normal range of motion. Neck supple. Carotid bruit is not present. No mass and no thyromegaly present.  Cardiovascular: Normal rate, regular rhythm, S1 normal, S2 normal, normal heart sounds, intact distal pulses and normal pulses.  Exam reveals no gallop and no friction rub.   No murmur heard. Pulmonary/Chest: Effort normal and breath sounds normal. Not tachypneic. No respiratory distress. She has no decreased breath sounds. She has no wheezes. She has no rhonchi. She has no rales.  Abdominal: Soft. Normal appearance and bowel sounds are normal. There is no tenderness.  Neurological: She is alert.  Skin: Skin is warm, dry and intact. No rash noted.  Psychiatric: Her speech is normal and behavior is normal. Judgment and thought content normal. Her mood appears not anxious. Cognition and memory are normal. She does not exhibit a depressed mood.          Assessment & Plan:

## 2012-04-14 ENCOUNTER — Telehealth: Payer: Self-pay | Admitting: Family Medicine

## 2012-04-14 NOTE — Telephone Encounter (Signed)
Message copied by Excell Seltzer on Sun Apr 14, 2012 11:42 PM ------      Message from: Baldomero Lamy      Created: Tue Apr 09, 2012  1:23 PM      Regarding: f/u labs scheduled Mon 04/15/12       Pt is on labs schedule for fasting labs, only an A1c is ordered. Is this the only lab you want done? ------

## 2012-04-14 NOTE — Telephone Encounter (Signed)
Labs already ordered

## 2012-04-15 ENCOUNTER — Other Ambulatory Visit: Payer: BC Managed Care – PPO

## 2012-04-16 ENCOUNTER — Ambulatory Visit: Payer: BC Managed Care – PPO | Admitting: Family Medicine

## 2012-04-17 ENCOUNTER — Other Ambulatory Visit (INDEPENDENT_AMBULATORY_CARE_PROVIDER_SITE_OTHER): Payer: BC Managed Care – PPO

## 2012-04-17 DIAGNOSIS — E1165 Type 2 diabetes mellitus with hyperglycemia: Secondary | ICD-10-CM

## 2012-04-19 ENCOUNTER — Encounter: Payer: Self-pay | Admitting: Family Medicine

## 2012-04-19 ENCOUNTER — Ambulatory Visit (INDEPENDENT_AMBULATORY_CARE_PROVIDER_SITE_OTHER): Payer: BC Managed Care – PPO | Admitting: Family Medicine

## 2012-04-19 VITALS — BP 130/94 | HR 86 | Temp 98.2°F | Ht 62.5 in | Wt 175.0 lb

## 2012-04-19 DIAGNOSIS — I10 Essential (primary) hypertension: Secondary | ICD-10-CM

## 2012-04-19 DIAGNOSIS — E1165 Type 2 diabetes mellitus with hyperglycemia: Secondary | ICD-10-CM

## 2012-04-19 NOTE — Assessment & Plan Note (Signed)
Improved control but not yet at goal. Increase levemir to 43 Units.

## 2012-04-19 NOTE — Assessment & Plan Note (Signed)
Did not take BP med today... Previously at goal <130/80. Follow at home, call if greater than 130/80. Encouraged exercise, weight loss, healthy eating habits.

## 2012-04-19 NOTE — Patient Instructions (Addendum)
Work on increasing exercise. Increase Levemir to 43 Units a day.  Follow BP at home..  Goal BP < 130/80. Call if it is elevated. Follow up in 3 week.

## 2012-04-19 NOTE — Progress Notes (Signed)
  Subjective:    Patient ID: Patricia Horne, female    DOB: 10/24/1950, 62 y.o.   MRN: [id number]  Diabetes Pertinent negatives for diabetes include no chest pain and no fatigue.     Diabetes: On levemir 40 Units now. Not taking metformin right now.  Lab Results  Component Value Date   HGBA1C 11.8* 04/17/2012   No SE at this time. Doing well taking med everyday.  Using medications without difficulties:  Hypoglycemic episodes: None  Hyperglycemic episodes: NONE  Feet problems:None  Blood Sugars averaging:  Fasting 150-160, at bedtime after meals 220 Working hard on diet changes.  No current exercise. Wt Readings from Last 3 Encounters:  04/19/12 175 lb (79.379 kg)  03/29/12 175 lb 12 oz (79.72 kg)  03/08/12 175 lb 8 oz (79.606 kg)   Has started bedtime snack, activia yogurt has helped constipation.   Hypertension: Borderline high today  Has not taken BP med yet today, missed it this morning.  Using medication without problems or lightheadedness: None Chest pain with exertion:None Edema:None Short of breath:None Average home BPs: 140/90 Other issues:    Review of Systems  Constitutional: Negative for fever and fatigue.  HENT: Negative for ear pain.   Eyes: Negative for pain.  Respiratory: Negative for chest tightness and shortness of breath.   Cardiovascular: Negative for chest pain, palpitations and leg swelling.  Gastrointestinal: Negative for abdominal pain.  Genitourinary: Negative for dysuria.       Objective:   Physical Exam  Constitutional: Vital signs are normal. She appears well-developed and well-nourished. She is cooperative.  Non-toxic appearance. She does not appear ill. No distress.  HENT:  Head: Normocephalic.  Right Ear: Hearing, tympanic membrane, external ear and ear canal normal. Tympanic membrane is not erythematous, not retracted and not bulging.  Left Ear: Hearing, tympanic membrane, external ear and ear canal normal. Tympanic membrane is not  erythematous, not retracted and not bulging.  Nose: No mucosal edema or rhinorrhea. Right sinus exhibits no maxillary sinus tenderness and no frontal sinus tenderness. Left sinus exhibits no maxillary sinus tenderness and no frontal sinus tenderness.  Mouth/Throat: Uvula is midline, oropharynx is clear and moist and mucous membranes are normal.  Eyes: Conjunctivae, EOM and lids are normal. Pupils are equal, round, and reactive to light. No foreign bodies found.  Neck: Trachea normal and normal range of motion. Neck supple. Carotid bruit is not present. No mass and no thyromegaly present.  Cardiovascular: Normal rate, regular rhythm, S1 normal, S2 normal, normal heart sounds, intact distal pulses and normal pulses.  Exam reveals no gallop and no friction rub.   No murmur heard. Pulmonary/Chest: Effort normal and breath sounds normal. Not tachypneic. No respiratory distress. She has no decreased breath sounds. She has no wheezes. She has no rhonchi. She has no rales.  Abdominal: Soft. Normal appearance and bowel sounds are normal. There is no tenderness.  Neurological: She is alert.  Skin: Skin is warm, dry and intact. No rash noted.  Psychiatric: Her speech is normal and behavior is normal. Judgment and thought content normal. Her mood appears not anxious. Cognition and memory are normal. She does not exhibit a depressed mood.   Diabetic foot exam: Normal inspection No skin breakdown No calluses  Normal DP pulses Normal sensation to light touch and monofilament Nails normal        Assessment & Plan:

## 2012-07-23 ENCOUNTER — Encounter: Payer: Self-pay | Admitting: Family Medicine

## 2012-07-23 ENCOUNTER — Ambulatory Visit (INDEPENDENT_AMBULATORY_CARE_PROVIDER_SITE_OTHER): Payer: BC Managed Care – PPO | Admitting: Family Medicine

## 2012-07-23 VITALS — BP 140/80 | HR 81 | Temp 97.3°F | Ht 62.5 in | Wt 175.5 lb

## 2012-07-23 DIAGNOSIS — E669 Obesity, unspecified: Secondary | ICD-10-CM

## 2012-07-23 DIAGNOSIS — E1165 Type 2 diabetes mellitus with hyperglycemia: Secondary | ICD-10-CM

## 2012-07-23 DIAGNOSIS — IMO0001 Reserved for inherently not codable concepts without codable children: Secondary | ICD-10-CM

## 2012-07-23 DIAGNOSIS — I10 Essential (primary) hypertension: Secondary | ICD-10-CM

## 2012-07-23 LAB — COMPREHENSIVE METABOLIC PANEL
Albumin: 3.4 g/dL — ABNORMAL LOW (ref 3.5–5.2)
BUN: 14 mg/dL (ref 6–23)
CO2: 25 mEq/L (ref 19–32)
GFR: 130.32 mL/min (ref >60.00)
Glucose, Bld: 355 mg/dL — ABNORMAL HIGH (ref 70–99)
Sodium: 138 mEq/L (ref 135–145)
Total Bilirubin: 1 mg/dL (ref 0.3–1.2)
Total Protein: 7.2 g/dL (ref 6.0–8.3)

## 2012-07-23 LAB — LIPID PANEL: HDL: 44.9 mg/dL (ref >39.00)

## 2012-07-23 LAB — HEMOGLOBIN A1C: Hgb A1c MFr Bld: 13.7 % — ABNORMAL HIGH (ref 4.6–6.5)

## 2012-07-23 MED ORDER — LORCASERIN HCL 10 MG PO TABS: 10.0000 mg | ORAL_TABLET | Freq: Two times a day (BID) | ORAL | Status: DC

## 2012-07-23 NOTE — Patient Instructions (Addendum)
Restart blood pressure medicaiton If you are comfortable... Restart levemir at 40 Units daily. Stop up at front to set up referral to ENDO. For weight loss can start belviq.  Follow up in 1 month for reassessment of weight and HTN. Follow BP closely in next few weeks after starting this medication.

## 2012-07-23 NOTE — Assessment & Plan Note (Signed)
I will refer her to ENDO... She states she will see them at least once. Hopefully Dr. Wyonia Hough  Has some possible options that pt is open to and can tolerate. Encourged pt to get back on track with diet and to consider restarting levemir at 40 Units.

## 2012-07-23 NOTE — Progress Notes (Signed)
62 year old female her for 3 month DM follow up.  Diabetes: She was on levemir 40 Units, was doing well being seen every two weeks to help with compliance. She stopped the insulin ( all her medicaitons) 1 week after her last visit.  She plans on retiring which will help her get better control of healthy per pt. " I cannot do it and work" She does also state that her friends ask why her doctor has not tried her on anything that does not give her SE.  She has not tolerated byetta, victoza, metformin and lantus.. At last OV she was tolerating levemir well.  She has always stated medication gives her dry mouth.   Due for repeat labs. Lab Results  Component Value Date   HGBA1C 11.8* 04/17/2012   No SE at this time. Doing well taking med everyday.  Using medications without difficulties:  Hypoglycemic episodes: None  Hyperglycemic episodes: NONE  Feet problems:None  Blood Sugars averaging: Fasting 318!!!!  No current exercise.  Drinking a lot a sweet tea.  Wt Readings from Last 3 Encounters:  07/23/12 175 lb 8 oz (79.606 kg)  04/19/12 175 lb (79.379 kg)  03/29/12 175 lb 12 oz (79.72 kg)   Hypertension: Borderline high today, she has not been taking any medication for BP either. Using medication without problems or lightheadedness: None  Chest pain with exertion:None  Edema:None  Short of breath:None  Average home BPs: 140/90  Other issues:   Review of Systems  Constitutional: Negative for fever and fatigue.  HENT: Negative for ear pain.  Eyes: Negative for pain.  Respiratory: Negative for chest tightness and shortness of breath.  She has ocassional cough from allergies. Zyrtec seems to help. Cardiovascular: Negative for chest pain, palpitations and leg swelling.  Gastrointestinal: Negative for abdominal pain.  Genitourinary: Negative for dysuria.  Objective:   Physical Exam  Constitutional: Vital signs are normal. She appears well-developed and well-nourished. She is  cooperative. Non-toxic appearance. She does not appear ill. No distress.  HENT:  Head: Normocephalic.  Right Ear: Hearing, tympanic membrane, external ear and ear canal normal. Tympanic membrane is not erythematous, not retracted and not bulging.  Left Ear: Hearing, tympanic membrane, external ear and ear canal normal. Tympanic membrane is not erythematous, not retracted and not bulging.  Nose: No mucosal edema or rhinorrhea. Right sinus exhibits no maxillary sinus tenderness and no frontal sinus tenderness. Left sinus exhibits no maxillary sinus tenderness and no frontal sinus tenderness.  Mouth/Throat: Uvula is midline, oropharynx is clear and moist and mucous membranes are normal.  Eyes: Conjunctivae, EOM and lids are normal. Pupils are equal, round, and reactive to light. No foreign bodies found.  Neck: Trachea normal and normal range of motion. Neck supple. Carotid bruit is not present. No mass and no thyromegaly present.  Cardiovascular: Normal rate, regular rhythm, S1 normal, S2 normal, normal heart sounds, intact distal pulses and normal pulses. Exam reveals no gallop and no friction rub.  No murmur heard.  Pulmonary/Chest: Effort normal and breath sounds normal. Not tachypneic. No respiratory distress. She has no decreased breath sounds. She has no wheezes. She has no rhonchi. She has no rales.  Abdominal: Soft. Normal appearance and bowel sounds are normal. There is no tenderness.  Neurological: She is alert.  Skin: Skin is warm, dry and intact. No rash noted.  Psychiatric: Her speech is normal and behavior is normal. Judgment and thought content normal. Her mood appears not anxious. Cognition and memory are normal.  She does not exhibit a depressed mood.   Diabetic foot exam:  Normal inspection  No skin breakdown  No calluses  Normal DP pulses  Normal sensation to light touch and monofilament  Nails normal

## 2012-07-23 NOTE — Assessment & Plan Note (Signed)
Counseled on exercise and healthy eating. She wishes to try Belviq for weight losss. We will start this and follow BP closely.  Follow up in 1 month.

## 2012-07-23 NOTE — Assessment & Plan Note (Signed)
At goal < 130/80 when she takes her medication. I encouraged her to restart this.  Encouraged exercise, weight loss, healthy eating habits.

## 2012-08-20 ENCOUNTER — Ambulatory Visit (INDEPENDENT_AMBULATORY_CARE_PROVIDER_SITE_OTHER): Payer: BC Managed Care – PPO | Admitting: Family Medicine

## 2012-08-20 ENCOUNTER — Encounter: Payer: Self-pay | Admitting: Family Medicine

## 2012-08-20 VITALS — BP 140/90 | HR 84 | Temp 98.1°F | Ht 62.5 in | Wt 175.5 lb

## 2012-08-20 DIAGNOSIS — E669 Obesity, unspecified: Secondary | ICD-10-CM

## 2012-08-20 DIAGNOSIS — I10 Essential (primary) hypertension: Secondary | ICD-10-CM

## 2012-08-20 DIAGNOSIS — E1165 Type 2 diabetes mellitus with hyperglycemia: Secondary | ICD-10-CM

## 2012-08-20 MED ORDER — INSULIN DETEMIR 100 UNIT/ML ~~LOC~~ SOLN: 20.0000 [IU] | Freq: Every day | SUBCUTANEOUS | Status: DC

## 2012-08-20 MED ORDER — AMLODIPINE BESYLATE 10 MG PO TABS: 10.0000 mg | ORAL_TABLET | Freq: Every day | ORAL | Status: DC

## 2012-08-20 MED ORDER — METFORMIN HCL 500 MG PO TABS: 500.0000 mg | ORAL_TABLET | Freq: Two times a day (BID) | ORAL | Status: DC

## 2012-08-20 NOTE — Assessment & Plan Note (Signed)
Not at goal <130/80. If BP measurements at home above goal as well.. Will add additional med.

## 2012-08-20 NOTE — Assessment & Plan Note (Signed)
Need better BP control prior to starting belviq.

## 2012-08-20 NOTE — Assessment & Plan Note (Signed)
Poor control.  She is willing to restart med. Compliance is her main issue. See last note for discussion. As previously stated she is open to endocrine visit to see if they can find a regimen that causes her less SE.

## 2012-08-20 NOTE — Progress Notes (Signed)
62 year old female her for 3 month DM follow up.   Diabetes: Referred to ENDO for further eval and treatment. Has appt on 8/13. She states she restarted the metformin 1000mg . She feels better now over all.  She is open to starting back insulin but is scare to do this at previous dose. Lab Results  Component Value Date   HGBA1C 13.7* 07/23/2012  Hypoglycemic episodes: None  Hyperglycemic episodes: NONE  Feet problems:None  Blood Sugars averaging:  F 248 No current exercise.  Drinking a lot a sweet tea.   Was not able to start Belviq for weight loss.Marland Kitchen awaiting insurance. Wt Readings from Last 3 Encounters:  08/20/12 175 lb 8 oz (79.606 kg)  07/23/12 175 lb 8 oz (79.606 kg)  04/19/12 175 lb (79.379 kg)     Hypertension: Now back on BP medication, today BP elevated above goal. Using medication without problems or lightheadedness: None  Chest pain with exertion:None, deos have some tissue burin sensation off and on. Edema:None  Short of breath:None  Average home BPs: At home 140/88 Other issues:   Review of Systems  Constitutional: Negative for fever and fatigue.  HENT: Negative for ear pain.  Eyes: Negative for pain.  Respiratory: Negative for chest tightness and shortness of breath. She has ocassional cough from allergies. Zyrtec seems to help.  Cardiovascular: Negative for chest pain, palpitations and leg swelling.  Gastrointestinal: Negative for abdominal pain.  Genitourinary: Negative for dysuria.  Objective:   Physical Exam  Constitutional: Vital signs are normal. She appears well-developed and well-nourished. She is cooperative. Non-toxic appearance. She does not appear ill. No distress.  HENT:  Head: Normocephalic.  Right Ear: Hearing, tympanic membrane, external ear and ear canal normal. Tympanic membrane is not erythematous, not retracted and not bulging.  Left Ear: Hearing, tympanic membrane, external ear and ear canal normal. Tympanic membrane is not erythematous,  not retracted and not bulging.  Nose: No mucosal edema or rhinorrhea. Right sinus exhibits no maxillary sinus tenderness and no frontal sinus tenderness. Left sinus exhibits no maxillary sinus tenderness and no frontal sinus tenderness.  Mouth/Throat: Uvula is midline, oropharynx is clear and moist and mucous membranes are normal.  Eyes: Conjunctivae, EOM and lids are normal. Pupils are equal, round, and reactive to light. No foreign bodies found.  Neck: Trachea normal and normal range of motion. Neck supple. Carotid bruit is not present. No mass and no thyromegaly present.  Cardiovascular: Normal rate, regular rhythm, S1 normal, S2 normal, normal heart sounds, intact distal pulses and normal pulses. Exam reveals no gallop and no friction rub.  No murmur heard.  Pulmonary/Chest: Effort normal and breath sounds normal. Not tachypneic. No respiratory distress. She has no decreased breath sounds. She has no wheezes. She has no rhonchi. She has no rales.  Abdominal: Soft. Normal appearance and bowel sounds are normal. There is no tenderness.  Neurological: She is alert.  Skin: Skin is warm, dry and intact. No rash noted.  Psychiatric: Her speech is normal and behavior is normal. Judgment and thought content normal. Her mood appears not anxious. Cognition and memory are normal. She does not exhibit a depressed mood.  Diabetic foot exam:  Normal inspection  No skin breakdown  No calluses  Normal DP pulses  Normal sensation to light touch and monofilament  Nails normal

## 2012-08-20 NOTE — Patient Instructions (Addendum)
Restart 20 units insulin daily. Gradually increase by 2 Units every 2 days until fasting blood sugar is less than 120. Continue metformin. Keep appt with endocrinologist as planned. We will look into approval of Belviq. Follow BP daily at home ... Call with measurements in next 2 weeks. Goal BP < 130/80. We may need to add a medication.  Once you start the belviq.. Make a follow up appt in 1 month.

## 2012-08-22 ENCOUNTER — Telehealth: Payer: Self-pay | Admitting: *Deleted

## 2012-08-22 NOTE — Telephone Encounter (Signed)
Pt checking on status of belviz PA, advised pt the process, she will be going out of town on 08/26/12 and would like to use flex card prior to that.pt will cb about PA.

## 2012-08-22 NOTE — Telephone Encounter (Signed)
Carrie faxed form back to [employer]

## 2012-08-22 NOTE — Telephone Encounter (Signed)
Completed in outbox.

## 2012-08-22 NOTE — Telephone Encounter (Signed)
Prior Berkley Harvey is needed for belviq, form is on your desk.

## 2012-08-23 NOTE — Telephone Encounter (Signed)
Completed.

## 2012-08-23 NOTE — Telephone Encounter (Signed)
[facility] has faxed a 2nd prior auth form to be completed for belviq.  Form is on your desk.

## 2012-08-23 NOTE — Telephone Encounter (Signed)
Form faxed back.

## 2012-08-26 ENCOUNTER — Other Ambulatory Visit: Payer: Self-pay | Admitting: Family Medicine

## 2012-08-28 ENCOUNTER — Other Ambulatory Visit: Payer: Self-pay

## 2012-08-28 MED ORDER — INSULIN DETEMIR 100 UNIT/ML ~~LOC~~ SOLN: 20.0000 [IU] | Freq: Every day | SUBCUTANEOUS | Status: DC

## 2012-08-28 NOTE — Telephone Encounter (Signed)
Pt request levemir called in to [facility]. Because her beni card or flexpay card runs out 08/29/12. Pt is in [place] and does not know if prescription is included with AVS given 08/20/12. Medication phoned to [facility] spoke with Tod Persia as instructed.

## 2012-09-11 ENCOUNTER — Ambulatory Visit (INDEPENDENT_AMBULATORY_CARE_PROVIDER_SITE_OTHER): Payer: BC Managed Care – PPO | Admitting: Internal Medicine

## 2012-09-11 ENCOUNTER — Encounter: Payer: Self-pay | Admitting: Internal Medicine

## 2012-09-11 VITALS — BP 132/100 | HR 88 | Temp 98.7°F | Resp 10 | Ht 62.0 in | Wt 175.0 lb

## 2012-09-11 DIAGNOSIS — IMO0001 Reserved for inherently not codable concepts without codable children: Secondary | ICD-10-CM

## 2012-09-11 DIAGNOSIS — I10 Essential (primary) hypertension: Secondary | ICD-10-CM

## 2012-09-11 DIAGNOSIS — E1165 Type 2 diabetes mellitus with hyperglycemia: Secondary | ICD-10-CM

## 2012-09-11 LAB — MICROALBUMIN / CREATININE URINE RATIO
Creatinine,U: 86.1 mg/dL
Microalb, Ur: 0.3 mg/dL (ref 0.0–1.9)

## 2012-09-11 NOTE — Progress Notes (Signed)
Patient ID: Patricia Horne, female   DOB: 19-Oct-1950, 62 y.o.   MRN: [id number]  HPI: Patricia Horne is a 62 y.o.-year-old female, referred by her PCP, Dr.Bedsole, for management of DM2, insulin-dependent, uncontrolled, without complications and also for HTN.  DM2; Patient has been diagnosed with diabetes in 2006; she started insulin within last year, but did not take it consistently. Last hemoglobin A1c was: Lab Results  Component Value Date   HGBA1C 13.7* 07/23/2012  Reviewing her records from 2011 on, she has not have a HbA1C <11%. Per PCP's note review, compliance is main problem.  Pt is on a regimen of: - Metformin 500 mg po bid stopped 2 weeks ago - advised to restarted before last visit with PCP in 08/20/12.  - Levemir did not start yet - advised to start at ast visit with PCP, at lower dose, 20 units hs. She was advised to increase the dose by 2 units q2 days if sugars in am not <120. She tells me that she gets nauseated if taking meds (regardless of the type), also gets nauseated only thinking about taking the meds. Pt does not check sugars, but sometimes checks if feels poorly. No lows. Lowest sugar was 120; ? hypoglycemia awareness. Highest sugar was 240.  Pt's meals are: - Breakfast: egg + toast + 2 strips of bacon - Lunch: sandwich, small salad - Dinner: meat + 2 veggies - Snacks: none She is drinking a lot of sweet tea. She does not exercise.  - no CKD, last BUN/creatinine:  Lab Results  Component Value Date   BUN 14 07/23/2012   CREATININE 0.6 07/23/2012  Last ACR was 0.4 in 2012.  - last set of lipids: Lab Results  Component Value Date   CHOL 140 07/23/2012   HDL 44.90 07/23/2012   LDLCALC 72 07/23/2012   TRIG 118.0 07/23/2012   CHOLHDL 3 07/23/2012   - last eye exam was July 2013. No DR. Has glaucoma. Noncompliant with drop.  - no numbness and tingling in her feet. Last diabetic foot exam was 08/20/12 >> normal.  She mentions she knows the consequences of untx DM as she  worked in the ED at [employer].   I reviewed her chart and she also has a history of obesity (did not start Belviq yet).  Pt has FH of DM in mother, brother.   HTN: Pt BPs at home are <140/90 usually. She was advised to start Norvasc 10, now off for last 2 weeks.   She just retired this summer.  ROS: Constitutional: +weight loss, + fatigue, no subjective hyperthermia/hypothermia, nocturia x1, poor sleep Eyes: no blurry vision, no xerophthalmia ENT: no sore throat, no nodules palpated in throat, no dysphagia/odynophagia, no hoarseness; =decreased hearing Cardiovascular: no CP/SOB/palpitations/leg swelling Respiratory: +cough/no SOB Gastrointestinal: no N/V/D/C Musculoskeletal: no muscle/joint aches Skin: no rashes, + hair loss Neurological: no tremors/numbness/tingling/dizziness Psychiatric: no depression/anxiety  Past Medical History  Diagnosis Date  . Other and unspecified noninfectious gastroenteritis and colitis(558.9)   . Type II or unspecified type diabetes mellitus without mention of complication, not stated as uncontrolled   . Screening for lipoid disorders   . Special screening for osteoporosis   . Other screening mammogram   . Acute upper respiratory infections of unspecified site   . Tibialis tendinitis   . Abdominal pain, left lower quadrant   . Routine general medical examination at a health care facility   . Obesity   . Lipoma of unspecified site   . Unspecified essential hypertension   .  Undiagnosed cardiac murmurs    Past Surgical History  Procedure Laterality Date  . Toe surgery  1990's    bunion, hammer toe repair  . Total abdominal hysterectomy  1983    one ovary removed, later other ovary removed   History   Social History  . Marital Status: Single    Spouse Name: N/A    Number of Children: 0   Occupational History  . 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 Smoking status: Never Smoker   . Smokeless  tobacco: Never Used  . Alcohol Use: No  . Drug Use: No  . Sexual Activity: No   Social History Narrative   Caffeine use: soda/Pepsi   Fast food.  Regular exercise at work.   Current Outpatient Prescriptions on File Prior to Visit  Medication Sig Dispense Refill  . ACCU-CHEK AVIVA test strip CHECK BLOOD SUGAR DAILY  50 each  11  . ACCU-CHEK FASTCLIX LANCETS MISC USE AS INSTRUCTED  100 each  1  . amLODipine (NORVASC) 10 MG tablet Take 1 tablet (10 mg total) by mouth daily.  30 tablet  11  . insulin detemir (LEVEMIR) 100 UNIT/ML injection Inject 0.2 mLs (20 Units total) into the skin at bedtime. Gradually increase every 2 days back up to 40 UNits previous dose if FBS remain above 120.  10 mL  3  . Lorcaserin HCl (BELVIQ) 10 MG TABS Take 10 mg by mouth 2 (two) times daily.  60 tablet  1  . metFORMIN (GLUCOPHAGE) 500 MG tablet Take 1 tablet (500 mg total) by mouth 2 (two) times daily with a meal.  60 tablet  11  . nystatin cream (MYCOSTATIN) Apply topically 2 (two) times daily.  30 g  1   No current facility-administered medications on file prior to visit.   No Known Allergies  Family History  Problem Relation Age of Onset  . Diabetes Mother   . Arthritis Father   . Diabetes Brother     type 1   PE: BP 132/100  Pulse 88  Temp(Src) 98.7 F (37.1 C) (Oral)  Resp 10  Ht 5\' 2"  (1.575 m)  Wt 175 lb (79.379 kg)  BMI 32 kg/m2  SpO2 96% Wt Readings from Last 3 Encounters:  09/11/12 175 lb (79.379 kg)  08/20/12 175 lb 8 oz (79.606 kg)  07/23/12 175 lb 8 oz (79.606 kg)   Constitutional: overweight, in NAD Eyes: PERRLA, EOMI, no exophthalmos ENT: moist mucous membranes, no thyromegaly, no cervical lymphadenopathy Cardiovascular: RRR, No MRG Respiratory: CTA B Gastrointestinal: abdomen soft, NT, ND, BS+ Musculoskeletal: no deformities, strength intact in all 4 Skin: moist, warm, no rashes Neurological: no tremor with outstretched hands, DTR normal in all 4  ASSESSMENT: 1. DM2,  insulin-dependent, uncontrolled, without complications - issue: noncompliance  2. HTN  PLAN:  1. DM2 Patient with long-standing, uncontrolled diabetes due to noncompliance. She is now on long acting insulin + oral antidiabetic regimen, both recently restarted. - we had a long discussion about the absolute need for compliance and we reviewed possible consequences of uncontrolled DM, to include amputations, kidney ds, cardiovascular events, blindness.  She understands that by not taking the medication she is jeopardizing her health/life. - We discussed about options for treatment, and I suggested to restart Metformin 500 mg bid and restart Levemir at 20 units hs - she just refilled both. She agrees to start doing that.  - Strongly advised her to start  checking her sugars at different times of the day - check 2 times a day, rotating checks - given sugar log and advised how to fill it and to bring it at next appt  - given foot care handout and explained the principles  - given instructions for hypoglycemia management "15-15 rule"  - she is up to date with eye exams - will check an ACR - as last was in 2012 - Return to clinic in one month with sugar log   2. HTN - Norvasc added at last visit with PCP, after sugars 140-150/80-90 - per new guidelines [facility], 2014), she is at target BP at home (<150/90 in pts >100 y/o, even with DM2). Today her dBP is a little high, will recheck at next visit, will likely add ACEI if still high  - can continue Norvasc but add ACEI for renal protection in the future (see above) - she does not have indications for evaluation for secondary causes for HTN (BP not very uncontrolled even w/o meds, no concerning features: HA/palpitations/tachycardia spells, no hypokalemia, etc.  Office Visit on 09/11/2012  Component Date Value Range Status  . Microalb, Ur 09/11/2012 0.3  0.0 - 1.9 mg/dL Final  . Creatinine,U 14/78/2956 86.1   Final  . Microalb Creat Ratio 09/11/2012 0.3   0.0 - 30.0 mg/g Final   Sent msg through MyChart.

## 2012-09-11 NOTE — Patient Instructions (Addendum)
Please return on 09/10 at 12:15 pm, with your sugar log.  Restart Metformin at 500 mg 2x a day. Restart Levemir 20 units at bedtime.  Send me a message through MyChart if sugars consistently >200.  Please restart Amlodipine.  PATIENT INSTRUCTIONS FOR TYPE 2 DIABETES:  DIET AND EXERCISE Diet and exercise is an important part of diabetic treatment.  We recommended aerobic exercise in the form of brisk walking (working between 40-60% of maximal aerobic capacity, similar to brisk walking) for 150 minutes per week (such as 30 minutes five days per week) along with 3 times per week performing 'resistance' training (using various gauge rubber tubes with handles) 5-10 exercises involving the major muscle groups (upper body, lower body and core) performing 10-15 repetitions (or near fatigue) each exercise. Start at half the above goal but build slowly to reach the above goals. If limited by weight, joint pain, or disability, we recommend daily walking in a swimming pool with water up to waist to reduce pressure from joints while allow for adequate exercise.    BLOOD GLUCOSES Monitoring your blood glucoses is important for continued management of your diabetes. Please check your blood glucoses 2-4 times a day: fasting, before meals and at bedtime (you can rotate these measurements - e.g. one day check before the 3 meals, the next day check before 2 of the meals and before bedtime, etc.   HYPOGLYCEMIA (low blood sugar) Hypoglycemia is usually a reaction to not eating, exercising, or taking too much insulin/ other diabetes drugs.  Symptoms include tremors, sweating, hunger, confusion, headache, etc. Treat IMMEDIATELY with 15 grams of Carbs:   4 glucose tablets    cup regular juice/soda   2 tablespoons raisins   4 teaspoons sugar   1 tablespoon honey Recheck blood glucose in 15 mins and repeat above if still symptomatic/blood glucose <100. Please contact our office at [phone] if you have questions  about how to next handle your insulin.  RECOMMENDATIONS TO REDUCE YOUR RISK OF DIABETIC COMPLICATIONS: * Take your prescribed MEDICATION(S). * Follow a DIABETIC diet: Complex carbs, fiber rich foods, heart healthy fish twice weekly, (monounsaturated and polyunsaturated) fats * AVOID saturated/trans fats, high fat foods, >2,300 mg salt per day. * EXERCISE at least 5 times a week for 30 minutes or preferably daily.  * DO NOT SMOKE OR DRINK more than 1 drink a day. * Check your FEET every day. Do not wear tightfitting shoes. Contact us if you develop an ulcer * See your EYE doctor once a year or more if needed * Get a FLU shot once a year * Get a PNEUMONIA vaccine once before and once after age 41 years  GOALS:  * Your Hemoglobin A1c of <7%  * fasting sugars need to be <130 * after meals sugars need to be <180 (2h after you start eating) * Your Systolic BP should be 150 or lower  * Your Diastolic BP should be 90 or lower  * Your HDL (Good Cholesterol) should be 40 or higher  * Your LDL (Bad Cholesterol) should be 100 or lower  * Your Triglycerides should be 150 or lower  * Your Urine microalbumin (kidney function) should be <30 * Your Body Mass Index should be 25 or lower   We will be glad to help you achieve these goals. Our telephone number is: [phone].

## 2012-09-20 ENCOUNTER — Observation Stay: Payer: Self-pay | Admitting: Internal Medicine

## 2012-09-20 LAB — COMPREHENSIVE METABOLIC PANEL
Alkaline Phosphatase: 98 U/L (ref 50–136)
Anion Gap: 2 — ABNORMAL LOW (ref 7–16)
Bilirubin,Total: 0.9 mg/dL (ref 0.2–1.0)
Calcium, Total: 9 mg/dL (ref 8.5–10.1)
Chloride: 105 mmol/L (ref 98–107)
Co2: 30 mmol/L (ref 21–32)
Creatinine: 0.69 mg/dL (ref 0.60–1.30)
EGFR (African American): >60
EGFR (Non-African Amer.): >60
Glucose: 250 mg/dL — ABNORMAL HIGH (ref 65–99)
Osmolality: 282 (ref 275–301)
Potassium: 3.8 mmol/L (ref 3.5–5.1)
SGOT(AST): 15 U/L (ref 15–37)
Sodium: 137 mmol/L (ref 136–145)
Total Protein: 7.7 g/dL (ref 6.4–8.2)

## 2012-09-20 LAB — URINALYSIS, COMPLETE
Bilirubin,UR: NEGATIVE
Blood: NEGATIVE
Glucose,UR: >=500 mg/dL (ref 0–75)
Leukocyte Esterase: NEGATIVE
Nitrite: NEGATIVE
RBC,UR: <1 /HPF (ref 0–5)
Specific Gravity: 1.023 (ref 1.003–1.030)
Squamous Epithelial: NONE SEEN
WBC UR: 1 /HPF (ref 0–5)

## 2012-09-20 LAB — CBC
HCT: 42.2 % (ref 35.0–47.0)
MCH: 32 pg (ref 26.0–34.0)
MCHC: 35 g/dL (ref 32.0–36.0)
MCV: 91 fL (ref 80–100)
Platelet: 269 10*3/uL (ref 150–440)
RDW: 12.6 % (ref 11.5–14.5)

## 2012-09-20 LAB — LIPASE, BLOOD: Lipase: 90 U/L (ref 73–393)

## 2012-09-21 LAB — CBC WITH DIFFERENTIAL/PLATELET
Basophil %: 1.1 %
Eosinophil #: 0.1 10*3/uL (ref 0.0–0.7)
Eosinophil %: 1.2 %
HCT: 37.1 % (ref 35.0–47.0)
Lymphocyte %: 36.8 %
Monocyte %: 6.7 %
Neutrophil #: 5.1 10*3/uL (ref 1.4–6.5)
RBC: 4.03 10*6/uL (ref 3.80–5.20)
WBC: 9.4 10*3/uL (ref 3.6–11.0)

## 2012-09-21 LAB — BASIC METABOLIC PANEL
Anion Gap: 7 (ref 7–16)
BUN: 10 mg/dL (ref 7–18)
Co2: 25 mmol/L (ref 21–32)
EGFR (African American): >60
EGFR (Non-African Amer.): >60
Osmolality: 282 (ref 275–301)
Potassium: 3.7 mmol/L (ref 3.5–5.1)
Sodium: 139 mmol/L (ref 136–145)

## 2012-09-24 ENCOUNTER — Encounter: Payer: Self-pay | Admitting: Family Medicine

## 2012-09-26 ENCOUNTER — Ambulatory Visit (INDEPENDENT_AMBULATORY_CARE_PROVIDER_SITE_OTHER): Payer: BC Managed Care – PPO | Admitting: Family Medicine

## 2012-09-26 ENCOUNTER — Encounter: Payer: Self-pay | Admitting: Family Medicine

## 2012-09-26 VITALS — BP 130/80 | HR 82 | Temp 98.2°F | Ht 62.0 in | Wt 175.0 lb

## 2012-09-26 DIAGNOSIS — K5900 Constipation, unspecified: Secondary | ICD-10-CM

## 2012-09-26 DIAGNOSIS — IMO0001 Reserved for inherently not codable concepts without codable children: Secondary | ICD-10-CM

## 2012-09-26 DIAGNOSIS — R109 Unspecified abdominal pain: Secondary | ICD-10-CM

## 2012-09-26 DIAGNOSIS — E1165 Type 2 diabetes mellitus with hyperglycemia: Secondary | ICD-10-CM

## 2012-09-26 NOTE — Patient Instructions (Addendum)
Stop percocet as it may be , use aleve or tylenol for pain unless severe.  Increase fiber in diet, keep up with water. Stop mirilax as not working. Start milk of magenesia, if no BM in 24-48 hours.. Do a fleets enema. Keep appt with GI Dr.

## 2012-09-26 NOTE — Assessment & Plan Note (Signed)
Continued poor control and noncompliance. Followed by ENDO.

## 2012-09-26 NOTE — Assessment & Plan Note (Signed)
Most likely due to constipation, gas pain, bloating. Agree she needs GI referral given new constipation... Concern for GI cancer, or possible diabetic gastroparesis.

## 2012-09-26 NOTE — Progress Notes (Signed)
  Subjective:    Patient ID: Patricia Horne, female    DOB: 1950/09/23, 62 y.o.   MRN: [id number]  HPI  54 62 year old female with DM presents for hospital follow up at [facility] after abdominal pain 1 week ago. She has been constipation since she has retired in last few months. She has been using multiple meds for constipation.. Mirilax, colace.  Last Thursday had sudden onset in sharp spasming 10/10 pain in  RUQ and right flank.  Went to ER on 8/22... Admitted until 8/24 AM.  She states she had an nml  Abd/ pelvis CT, [place] abd ( no gallstones), back X-Ray, labs ( nml wbc, no anemia, nml cr and lfts, lipase, UA) without diagnosis found.   She was told to take mirilax, pecocet (using once to 2 times a day), robaxin prn pain (she has not been using).  She has been using mirilax daily. She has not really had a BM since last week. Fullness/pain in right flank 4/10  Last colonscopy 5 years ago.. nml then Dr. Nils Flack.  Told to schedule appt to see GI Dr. Ricki Rodriguez.   She is now seeing ENDO for DM, she is still not taking levemir. Lab Results  Component Value Date   HGBA1C 13.7* 07/23/2012      Review of Systems  Constitutional: Negative for fever and fatigue.  HENT: Negative for ear pain.   Eyes: Negative for pain.  Respiratory: Negative for chest tightness and shortness of breath.   Cardiovascular: Negative for chest pain, palpitations and leg swelling.  Gastrointestinal: Negative for abdominal pain.  Genitourinary: Negative for dysuria.       Objective:   Physical Exam  Constitutional: Vital signs are normal. She appears well-developed and well-nourished. She is cooperative.  Non-toxic appearance. She does not appear ill. No distress.  HENT:  Head: Normocephalic.  Right Ear: Hearing, tympanic membrane, external ear and ear canal normal. Tympanic membrane is not erythematous, not retracted and not bulging.  Left Ear: Hearing, tympanic membrane, external ear and ear canal normal. Tympanic  membrane is not erythematous, not retracted and not bulging.  Nose: No mucosal edema or rhinorrhea. Right sinus exhibits no maxillary sinus tenderness and no frontal sinus tenderness. Left sinus exhibits no maxillary sinus tenderness and no frontal sinus tenderness.  Mouth/Throat: Uvula is midline, oropharynx is clear and moist and mucous membranes are normal.  Eyes: Conjunctivae, EOM and lids are normal. Pupils are equal, round, and reactive to light. Lids are everted and swept, no foreign bodies found.  Neck: Trachea normal and normal range of motion. Neck supple. Carotid bruit is not present. No mass and no thyromegaly present.  Cardiovascular: Normal rate, regular rhythm, S1 normal, S2 normal, normal heart sounds, intact distal pulses and normal pulses.  Exam reveals no gallop and no friction rub.   No murmur heard. Pulmonary/Chest: Effort normal and breath sounds normal. Not tachypneic. No respiratory distress. She has no decreased breath sounds. She has no wheezes. She has no rhonchi. She has no rales.  Abdominal: Soft. Normal appearance and bowel sounds are normal. There is tenderness.  Right mid-abdomen over flank  Neurological: She is alert.  Skin: Skin is warm, dry and intact. No rash noted.  Psychiatric: Her speech is normal and behavior is normal. Judgment and thought content normal. Her mood appears not anxious. Cognition and memory are normal. She does not exhibit a depressed mood.          Assessment & Plan:

## 2012-09-26 NOTE — Assessment & Plan Note (Signed)
Percocet likely exacerbating issue, so will have her stop. She is used to a low fiber diet so this is contributing. Info on increasing fiber and water given. Increase exercise as well.  Use milk of magnesia, then enema if not successful. Keep appt for Gi eval.

## 2012-09-30 ENCOUNTER — Emergency Department: Payer: Self-pay | Admitting: Emergency Medicine

## 2012-09-30 LAB — URINALYSIS, COMPLETE
Leukocyte Esterase: NEGATIVE
Nitrite: NEGATIVE
Protein: NEGATIVE
Squamous Epithelial: NONE SEEN
WBC UR: 6 /HPF (ref 0–5)

## 2012-09-30 LAB — CBC
HGB: 14.8 g/dL (ref 12.0–16.0)
MCH: 32.5 pg (ref 26.0–34.0)
MCHC: 35.3 g/dL (ref 32.0–36.0)
MCV: 92 fL (ref 80–100)
Platelet: 274 10*3/uL (ref 150–440)
RDW: 12.7 % (ref 11.5–14.5)
WBC: 6.2 10*3/uL (ref 3.6–11.0)

## 2012-09-30 LAB — COMPREHENSIVE METABOLIC PANEL
Albumin: 3.6 g/dL (ref 3.4–5.0)
Alkaline Phosphatase: 96 U/L (ref 50–136)
Anion Gap: 5 — ABNORMAL LOW (ref 7–16)
BUN: 10 mg/dL (ref 7–18)
Bilirubin,Total: 0.7 mg/dL (ref 0.2–1.0)
Creatinine: 0.61 mg/dL (ref 0.60–1.30)
EGFR (African American): >60
EGFR (Non-African Amer.): >60
Glucose: 230 mg/dL — ABNORMAL HIGH (ref 65–99)
Osmolality: 276 (ref 275–301)
SGOT(AST): 15 U/L (ref 15–37)
SGPT (ALT): 19 U/L (ref 12–78)
Sodium: 135 mmol/L — ABNORMAL LOW (ref 136–145)
Total Protein: 7.7 g/dL (ref 6.4–8.2)

## 2012-10-09 ENCOUNTER — Encounter: Payer: Self-pay | Admitting: Internal Medicine

## 2012-10-09 ENCOUNTER — Encounter: Payer: Self-pay | Admitting: Family Medicine

## 2012-10-09 ENCOUNTER — Ambulatory Visit (INDEPENDENT_AMBULATORY_CARE_PROVIDER_SITE_OTHER): Payer: BC Managed Care – PPO | Admitting: Internal Medicine

## 2012-10-09 VITALS — BP 120/74 | HR 80 | Temp 98.6°F | Ht 62.5 in | Wt 172.5 lb

## 2012-10-09 DIAGNOSIS — Z23 Encounter for immunization: Secondary | ICD-10-CM

## 2012-10-09 DIAGNOSIS — E1165 Type 2 diabetes mellitus with hyperglycemia: Secondary | ICD-10-CM

## 2012-10-09 DIAGNOSIS — I10 Essential (primary) hypertension: Secondary | ICD-10-CM

## 2012-10-09 MED ORDER — METFORMIN HCL 1000 MG PO TABS: 1000.0000 mg | ORAL_TABLET | Freq: Two times a day (BID) | ORAL | Status: DC

## 2012-10-09 NOTE — Patient Instructions (Signed)
Please increase Metformin to 1000 mg 2x a day. Start Levemir 20 units at night. Please return in 2 months with your sugar log.  Please stop at the lab.

## 2012-10-09 NOTE — Progress Notes (Signed)
Patient ID: Patricia Horne, female   DOB: 1950-06-04, 62 y.o.   MRN: [id number]  HPI: Patricia Horne is a 62 y.o.-year-old female, for f/u for DM2, dx 2006, insulin-dependent (refusing insulin), uncontrolled, without complications and also for HTN. Last visit 1 mo ago.  She was in the hospital [facility]) in twice in the last month with R sided AP - U/S, CT scan and Xrays negative >> will have appt with GI for colonoscopy.   DM2; Patient started insulin within last year, but did not take it consistently. Last hemoglobin A1c was: Lab Results  Component Value Date   HGBA1C 13.7* 07/23/2012  Reviewing her records from 2011 on, she has not have a HbA1C <11%. Per PCP's note review, compliance is main problem.  Pt is on a regimen of: - Metformin 500 mg po bid - restarted at last visit - Levemir did not start yet - advised to start at ast visit with PCP, at lower dose, 20 units hs.  She tells me that she gets nauseated if taking meds (regardless of the type), also gets nauseated only thinking about taking the meds.  Pt started to check sugars occasionally: - am: 150-230- occasionally 137 (lowest) - 2h after dinner: 200-250 - 1 am last night: 300s >> took 20 units Levemir No lows.? hypoglycemia awareness. Highest sugar was 300s. She does not exercise.  - no CKD, last BUN/creatinine:  Lab Results  Component Value Date   BUN 14 07/23/2012   CREATININE 0.6 07/23/2012  Last ACR was 0.3 on 09/11/2012.  - last set of lipids: Lab Results  Component Value Date   CHOL 140 07/23/2012   HDL 44.90 07/23/2012   LDLCALC 72 07/23/2012   TRIG 118.0 07/23/2012   CHOLHDL 3 07/23/2012   - last eye exam was July 2014. No DR. Has glaucoma. Noncompliant with drops.  - no numbness and tingling in her feet. Last diabetic foot exam was 08/20/12 >> normal.  I reviewed her chart and she also has a history of obesity.  HTN: Pt BPs at home are <140/90 usually. She was advised to start Norvasc 10, now off for last 2 weeks.   Today's BP: 120/74.  I reviewed pt's medications, allergies, PMH, social hx, family hx and no changes required, except as mentioned above.  ROS: Constitutional: no weight gain/loss, no fatigue, no subjective hyperthermia/hypothermia Eyes: no blurry vision, no xerophthalmia ENT: no sore throat, no nodules palpated in throat, no dysphagia/odynophagia, no hoarseness Cardiovascular: no CP/SOB/palpitations/leg swelling Respiratory: +cough/no SOB Gastrointestinal: no N/V/+D/+C Musculoskeletal: no muscle/joint aches Skin: no rashes Neurological: no tremors/numbness/tingling/dizziness  PE: BP 120/74  Pulse 80  Temp(Src) 98.6 F (37 C) (Oral)  Ht 5' 2.5" (1.588 m)  Wt 172 lb 8 oz (78.245 kg)  BMI 31.03 kg/m2 Wt Readings from Last 3 Encounters:  10/09/12 172 lb 8 oz (78.245 kg)  09/26/12 175 lb (79.379 kg)  09/11/12 175 lb (79.379 kg)   Constitutional: overweight, in NAD Eyes: PERRLA, EOMI, no exophthalmos ENT: moist mucous membranes, no thyromegaly, no cervical lymphadenopathy Cardiovascular: RRR, No MRG Respiratory: CTA B Gastrointestinal: abdomen soft, NT, ND, BS+ Musculoskeletal: no deformities, strength intact in all 4 Skin: moist, warm, no rashes Foot exam performed today.  ASSESSMENT: 1. DM2, insulin-dependent, uncontrolled, without complications - issue: noncompliance with insulin and CBG checks  2. HTN - only on Amlodipine 10  PLAN:  1. DM2 Patient with long-standing, uncontrolled diabetes due to noncompliance. She does not take her long acting insulin + but tolerates well the  Metformin 500 bid. - We discussed about options for treatment, and I suggested to increase Metformin to 1000 mg bid and restart Levemir 20 units hs - she just refilled both. Since she is reticent to insulin, we discussed about possibly adding Januvia and Glipizide, but she tells me she will try to use Levemir. - Strongly advised her to start checking her sugars at different times of the day -  check 2 times a day, rotating checks - advised to bring sugar log at next appt  - advised her to start brisk walking outside since it is cooler - she agrees to start - given flu vaccine - she is up to date with eye exams - will check a HbA1c in 2 weeks - Return to clinic in 2 months with sugar log   2. HTN - on Norvasc >> BP great today - per new guidelines [facility], 2014), she is at target BP at home (<150/90 in pts >62 y/o, even with DM2).  - can continue Norvasc but will likely need ACEI for renal protection in the future (see above)

## 2012-10-17 ENCOUNTER — Ambulatory Visit: Payer: Self-pay | Admitting: Gastroenterology

## 2012-10-28 ENCOUNTER — Other Ambulatory Visit (INDEPENDENT_AMBULATORY_CARE_PROVIDER_SITE_OTHER): Payer: BC Managed Care – PPO

## 2012-10-28 DIAGNOSIS — E1165 Type 2 diabetes mellitus with hyperglycemia: Secondary | ICD-10-CM

## 2012-10-28 DIAGNOSIS — IMO0001 Reserved for inherently not codable concepts without codable children: Secondary | ICD-10-CM

## 2012-10-28 LAB — HEMOGLOBIN A1C: Hgb A1c MFr Bld: 11.5 % — ABNORMAL HIGH (ref 4.6–6.5)

## 2012-11-19 ENCOUNTER — Encounter: Payer: Self-pay | Admitting: Family Medicine

## 2012-11-19 ENCOUNTER — Ambulatory Visit (INDEPENDENT_AMBULATORY_CARE_PROVIDER_SITE_OTHER): Payer: BC Managed Care – PPO | Admitting: Family Medicine

## 2012-11-19 VITALS — BP 164/92 | HR 86 | Temp 98.1°F | Ht 62.5 in | Wt 173.8 lb

## 2012-11-19 DIAGNOSIS — E1165 Type 2 diabetes mellitus with hyperglycemia: Secondary | ICD-10-CM

## 2012-11-19 DIAGNOSIS — R109 Unspecified abdominal pain: Secondary | ICD-10-CM

## 2012-11-19 DIAGNOSIS — I10 Essential (primary) hypertension: Secondary | ICD-10-CM

## 2012-11-19 NOTE — Assessment & Plan Note (Signed)
Encouraged pt to follow Dr. Damien Fusi recommendations.  Start back exercise.

## 2012-11-19 NOTE — Patient Instructions (Addendum)
Get back to exercising.. Set up walking with friend. Schedule CPX in 03/2013, no labs prior.

## 2012-11-19 NOTE — Assessment & Plan Note (Signed)
Well controlled when taking meds (she usually does take this). Encouraged to take daily. She did try lisinopril in past... Gave cough, but unsure if tried ARB ( I don't see any documentation of such)... Good idea to try in furture if she is agreeable.

## 2012-11-19 NOTE — Progress Notes (Signed)
Subjective:    Patient ID: Patricia Horne, female    DOB: March 25, 1950, 62 y.o.   MRN: [id number]  HPI  Patient with long-standing, uncontrolled diabetes due to noncompliance. She does not take her long acting insulin + but tolerates well the Metformin 500 bid.   After last OV in 08/2012... Recommended visit with Dr Wyonia Hough ENDO.  Dr. Wyonia Hough recommended at 10/09/2012: -I suggested to increase Metformin to 1000 mg bid and restart Levemir 20 units hs - she just refilled both. Since she is reticent to insulin, we discussed about possibly adding Januvia and Glipizide, but she tells me she will try to use Levemir.  - Strongly advised  to start checking her sugars at different times of the day - check 2 times a day, rotating checks  - advised to bring sugar log at next appt  - advised her to start brisk walking outside since it is cooler - she agrees to start  - given flu vaccine  - she is up to date with eye exams   TODAY: pt states she has not restarted Levemir, but does take metformin at the higher dose. SHE DOES STILL PLAN TO TRY LEVEMIR. She is tolerating metfromin fairly well.  She is not exercising much.  She has appt win November 12 with pt.  Lab Results  Component Value Date   HGBA1C 11.5* 10/28/2012   Hypertension:  Previously well controlled on amlodipine.  She DID NOT TAKE MED THIS AM BUT WILL. Using medication without problems or lightheadedness: None Chest pain with exertion:None Edema:None Short of breath:None Average home BPs:120/70s Other issues:  No futher abdominal pain issues... borederline HIDA scan.  Have coming colonoscopy.     Review of Systems  Constitutional: Negative for fever and fatigue.  HENT: Negative for ear pain.   Eyes: Negative for pain.  Respiratory: Negative for chest tightness and shortness of breath.   Cardiovascular: Negative for chest pain, palpitations and leg swelling.  Gastrointestinal: Negative for abdominal pain.  Genitourinary: Negative  for dysuria.       Objective:   Physical Exam  Constitutional: She is oriented to person, place, and time. Vital signs are normal. She appears well-developed and well-nourished. She is cooperative.  Non-toxic appearance. She does not appear ill. No distress.  overweight  HENT:  Head: Normocephalic.  Right Ear: Hearing, tympanic membrane, external ear and ear canal normal. Tympanic membrane is not erythematous, not retracted and not bulging.  Left Ear: Hearing, tympanic membrane, external ear and ear canal normal. Tympanic membrane is not erythematous, not retracted and not bulging.  Nose: No mucosal edema or rhinorrhea. Right sinus exhibits no maxillary sinus tenderness and no frontal sinus tenderness. Left sinus exhibits no maxillary sinus tenderness and no frontal sinus tenderness.  Mouth/Throat: Uvula is midline, oropharynx is clear and moist and mucous membranes are normal.  Eyes: Conjunctivae, EOM and lids are normal. Pupils are equal, round, and reactive to light. Lids are everted and swept, no foreign bodies found.  Neck: Trachea normal and normal range of motion. Neck supple. Carotid bruit is not present. No mass and no thyromegaly present.  Cardiovascular: Normal rate, regular rhythm, S1 normal, S2 normal, normal heart sounds, intact distal pulses and normal pulses.  Exam reveals no gallop and no friction rub.   No murmur heard. Pulmonary/Chest: Effort normal and breath sounds normal. Not tachypneic. No respiratory distress. She has no decreased breath sounds. She has no wheezes. She has no rhonchi. She has no rales.  Abdominal: Soft. Normal  appearance and bowel sounds are normal. There is no tenderness.  Neurological: She is alert and oriented to person, place, and time.  Skin: Skin is warm, dry and intact. No rash noted.  Psychiatric: Her speech is normal and behavior is normal. Judgment and thought content normal. Her mood appears not anxious. Cognition and memory are normal. She  does not exhibit a depressed mood.      Diabetic foot exam: Normal inspection No skin breakdown No calluses  Normal DP pulses Normal sensation to light touch and monofilament Nails normal     Assessment & Plan:

## 2012-11-19 NOTE — Assessment & Plan Note (Signed)
Improved. Has colonoscopy scheduled with GI.

## 2012-11-29 ENCOUNTER — Ambulatory Visit (INDEPENDENT_AMBULATORY_CARE_PROVIDER_SITE_OTHER): Payer: BC Managed Care – PPO | Admitting: Family Medicine

## 2012-11-29 ENCOUNTER — Encounter: Payer: Self-pay | Admitting: Family Medicine

## 2012-11-29 VITALS — BP 150/90 | HR 80 | Temp 98.3°F | Ht 62.5 in | Wt 175.0 lb

## 2012-11-29 DIAGNOSIS — W010XXD Fall on same level from slipping, tripping and stumbling without subsequent striking against object, subsequent encounter: Secondary | ICD-10-CM

## 2012-11-29 DIAGNOSIS — W010XXA Fall on same level from slipping, tripping and stumbling without subsequent striking against object, initial encounter: Secondary | ICD-10-CM | POA: Insufficient documentation

## 2012-11-29 DIAGNOSIS — IMO0001 Reserved for inherently not codable concepts without codable children: Secondary | ICD-10-CM

## 2012-11-29 DIAGNOSIS — S0990XA Unspecified injury of head, initial encounter: Secondary | ICD-10-CM

## 2012-11-29 DIAGNOSIS — Z5189 Encounter for other specified aftercare: Secondary | ICD-10-CM

## 2012-11-29 DIAGNOSIS — E1165 Type 2 diabetes mellitus with hyperglycemia: Secondary | ICD-10-CM

## 2012-11-29 DIAGNOSIS — S91119A Laceration without foreign body of unspecified toe without damage to nail, initial encounter: Secondary | ICD-10-CM | POA: Insufficient documentation

## 2012-11-29 DIAGNOSIS — S91119D Laceration without foreign body of unspecified toe without damage to nail, subsequent encounter: Secondary | ICD-10-CM

## 2012-11-29 NOTE — Assessment & Plan Note (Signed)
No sign of infection. Area well healing.  Keep clean and dry and apply topical anesthetic.

## 2012-11-29 NOTE — Progress Notes (Signed)
  Subjective:    Patient ID: Patricia Horne, female    DOB: 02-13-1950, 62 y.o.   MRN: [id number]  HPI  62 year old female with DM presents following trip to [place]. She feel at train station, lost balance. Was feeling well. No dizziness, no chest pain. Lare[place] Seat forward on hands and hit head on door. No LOC, no confusion. Forehead was sore. The next day she dropped a can on her 3rd digit distally, laceration.  Went to Urgent Care. Neg fracture on X.-ray  Applied neosporin, bandaid. Has been wearing open toe.   Now no discharge, no redness, minimal pain. Head soreness is gradually improving, minimal headache. No neuro changes.     Review of Systems  Constitutional: Negative for fever and fatigue.  HENT: Negative for ear pain.   Eyes: Negative for pain.  Respiratory: Negative for chest tightness and shortness of breath.   Cardiovascular: Negative for chest pain, palpitations and leg swelling.  Gastrointestinal: Negative for abdominal pain.  Genitourinary: Negative for dysuria.       Objective:   Physical Exam  Constitutional: She is oriented to person, place, and time. Vital signs are normal. She appears well-developed and well-nourished. She is cooperative.  Non-toxic appearance. She does not appear ill. No distress.  HENT:  Head: Normocephalic.  Right Ear: Hearing, tympanic membrane, external ear and ear canal normal. Tympanic membrane is not erythematous, not retracted and not bulging.  Left Ear: Hearing, tympanic membrane, external ear and ear canal normal. Tympanic membrane is not erythematous, not retracted and not bulging.  Nose: No mucosal edema or rhinorrhea. Right sinus exhibits no maxillary sinus tenderness and no frontal sinus tenderness. Left sinus exhibits no maxillary sinus tenderness and no frontal sinus tenderness.  Mouth/Throat: Uvula is midline, oropharynx is clear and moist and mucous membranes are normal.  Eyes: Conjunctivae, EOM and lids are normal. Pupils are equal,  round, and reactive to light. Lids are everted and swept, no foreign bodies found.  Neck: Trachea normal and normal range of motion. Neck supple. Carotid bruit is not present. No mass and no thyromegaly present.  Cardiovascular: Normal rate, regular rhythm, S1 normal, S2 normal, normal heart sounds, intact distal pulses and normal pulses.  Exam reveals no gallop and no friction rub.   No murmur heard. Pulmonary/Chest: Effort normal and breath sounds normal. Not tachypneic. No respiratory distress. She has no decreased breath sounds. She has no wheezes. She has no rhonchi. She has no rales.  Abdominal: Soft. Normal appearance and bowel sounds are normal. There is no tenderness.  Neurological: She is alert and oriented to person, place, and time. She has normal strength. No cranial nerve deficit or sensory deficit. She displays a negative Romberg sign.  Skin: Skin is warm, dry and intact. No rash noted.  Left third digit distally at nail edge,  well healing laceration 1 cm, minimal warmth and erythema.  Psychiatric: Her speech is normal and behavior is normal. Judgment and thought content normal. Her mood appears not anxious. Cognition and memory are normal. She does not exhibit a depressed mood.          Assessment & Plan:

## 2012-11-29 NOTE — Patient Instructions (Addendum)
Keep wound clean and dry. Apply neosporin. Wash with warm water twice daily. Call if redness, heat or discharge from wound or fever. Call if increasing headache,or any neurologic changes.

## 2012-11-29 NOTE — Assessment & Plan Note (Signed)
No red flags, no neuro changes. Possible mild concussion.

## 2012-12-10 ENCOUNTER — Ambulatory Visit: Payer: BC Managed Care – PPO | Admitting: Family Medicine

## 2012-12-11 ENCOUNTER — Ambulatory Visit: Payer: BC Managed Care – PPO | Admitting: Internal Medicine

## 2012-12-18 ENCOUNTER — Ambulatory Visit: Payer: BC Managed Care – PPO | Admitting: Internal Medicine

## 2013-01-08 ENCOUNTER — Ambulatory Visit (INDEPENDENT_AMBULATORY_CARE_PROVIDER_SITE_OTHER): Payer: BC Managed Care – PPO | Admitting: Internal Medicine

## 2013-01-08 ENCOUNTER — Encounter: Payer: Self-pay | Admitting: Internal Medicine

## 2013-01-08 VITALS — BP 132/96 | HR 89 | Temp 98.1°F | Resp 12 | Wt 173.1 lb

## 2013-01-08 DIAGNOSIS — E1165 Type 2 diabetes mellitus with hyperglycemia: Secondary | ICD-10-CM

## 2013-01-08 NOTE — Patient Instructions (Signed)
Please start Metformin 500 mg 2x a day, with a meal.  Start Levemir 20 units at bedtime. Please return in 1 month with your sugar log.

## 2013-01-08 NOTE — Progress Notes (Signed)
Patient ID: Patricia Horne, female   DOB: 04/14/1950, 62 y.o.   MRN: [id number]  HPI: Patricia Horne is a 62 y.o.-year-old female, for f/u for DM2, dx 2006, insulin-dependent (refusing insulin), uncontrolled, without complications and also for HTN. Last visit 3 mo ago.  DM2; Patient started insulin within last year, but did not take it consistently. Last hemoglobin A1c was: Lab Results  Component Value Date   HGBA1C 11.5* 10/28/2012   HGBA1C 13.7* 07/23/2012   HGBA1C 11.8* 04/17/2012  Compliance is main problem.  Pt should be on a regimen of: - Metformin 500 mg po bid - not taking, does not know why, except for metformin - Levemir 20 units hs - not taking She gets nauseated if taking meds (regardless of the type), also gets nauseated only thinking about taking the meds.  Pt started to check sugars every once in a while - no log: - am: 150-230- occasionally 137 (lowest) - 2h after dinner: 200-250 ? lows.? hypoglycemia awareness.  She does not exercise.  - no CKD, last BUN/creatinine:  Lab Results  Component Value Date   BUN 14 07/23/2012   CREATININE 0.6 07/23/2012  Last ACR was 0.3 on 09/11/2012.  - last set of lipids: Lab Results  Component Value Date   CHOL 140 07/23/2012   HDL 44.90 07/23/2012   LDLCALC 72 07/23/2012   TRIG 118.0 07/23/2012   CHOLHDL 3 07/23/2012   - last eye exam was July 2014. No DR. Has glaucoma. Noncompliant with drops.  - no numbness and tingling in her feet. Last diabetic foot exam was 08/20/12 >> normal.  I reviewed her chart and she also has a history of obesity.  HTN: Pt BPs at home are <140/90 usually. She was advised to start Norvasc 10, now off for last 2 weeks.  Today's BP: 120/74.  I reviewed pt's medications, allergies, PMH, social hx, family hx and no changes required, except as mentioned above.  ROS: Constitutional: no weight gain/loss, + fatigue, no subjective hyperthermia/hypothermia, + poor sleep Eyes: no blurry vision, no xerophthalmia ENT:  no sore throat, no nodules palpated in throat, no dysphagia/odynophagia, no hoarseness Cardiovascular: no CP/SOB/palpitations/leg swelling Respiratory: +cough/no SOB Gastrointestinal: no N/V/D/C Musculoskeletal: no muscle/joint aches Skin: no rashes Neurological: no tremors/numbness/tingling/dizziness  PE: BP 132/96  Pulse 89  Temp(Src) 98.1 F (36.7 C) (Oral)  Resp 12  Wt 173 lb 1.9 oz (78.527 kg)  SpO2 97% Wt Readings from Last 3 Encounters:  01/08/13 173 lb 1.9 oz (78.527 kg)  11/29/12 175 lb (79.379 kg)  11/19/12 173 lb 12 oz (78.812 kg)   Constitutional: overweight, in NAD Eyes: PERRLA, EOMI, no exophthalmos ENT: moist mucous membranes, no thyromegaly, no cervical lymphadenopathy Cardiovascular: RRR, No MRG Respiratory: CTA B Gastrointestinal: abdomen soft, NT, ND, BS+ Musculoskeletal: no deformities, strength intact in all 4 Skin: moist, warm, no rashes  ASSESSMENT: 1. DM2, insulin-dependent, uncontrolled, without complications - issue: noncompliance with insulin and CBG checks  2. HTN -  on Amlodipine 10  PLAN:  1. DM2 Patient with long-standing, uncontrolled diabetes due to noncompliance. She does not take her long acting insulin + or the Metformin! Strongly advised her to start taking her meds and restart checking sugars: Patient Instructions  Please start Metformin 500 mg 2x a day, with a meal.  Start Levemir 20 units at bedtime. Please return in 1 month with your sugar log.  - if still metallic taste, I can call in brand name Glucophage - advised to bring sugar log at next  appt  - advised her to start walking - had flu vaccine this season - she is up to date with eye exams - will check a HbA1c in 2 weeks - Return to clinic in 1 month with sugar log   2. HTN - on Norvasc >> sBP high >> encouraged compliance - can continue Norvasc but will likely need ACEI for renal protection in the future

## 2013-01-26 ENCOUNTER — Emergency Department: Payer: Self-pay | Admitting: Emergency Medicine

## 2013-01-29 ENCOUNTER — Telehealth: Payer: Self-pay | Admitting: Family Medicine

## 2013-01-29 NOTE — Telephone Encounter (Signed)
[facility] Triage Call Report Triage Record Num: [id number] Operator: Patriciaann Clan Patient Name: Patricia Horne Call Date & Time: 01/28/2013 4:42:55PM Patient Phone: [phone] PCP: Kerby Nora Patient Gender: Female PCP Fax : [phone] Patient DOB: 02-20-1950 [facility] Reason for Call: Caller: Jovonda/Patient; PCP: Kerby Nora (Family Practice); CB#: [phone]; Call regarding Cough/Congestion; Patient states she developed cough, fever, congestion, aching. Onset 01/26/13. Patient states seh was seen at [facility] and diagnosed with Viral illness. Patient states she was prescribed Tamiflu and Tussionex. Patient states she has been afebrile since 11/27/12 a.m. Patient states she developed intermittent pain in right mid back and bilateral anterior rib pain. Onset 01/26/13. Patient states cough and congestion have improved. States cough is non-productive. Patient denies pain with deep inspiration. States anterior rib and mid back pain increases with movement and cough. Denies chest pain. Triage per Cough, Chest Pain and Flu-Like sx Protocol. No emergent sx identified. Disposition of " See Provider within 72 hours" obtained related to positive triage assessment for " Evaluated by Provider and has questions/concerns." Care advice given per guidelines. Patient advised increased fluids, warm fluids, inhaled steam, humidifier. Patient advised Tylenol 2 tablets q 4 hours as needed for pain. Patient declines offered appt. for 01/29/13. Patient states she will return the call for an appt. if sx persist. Call back parameters reviewed. Patient verbalizes understanding. Protocol(s) Used: Cough - Adult Recommended Outcome per Protocol: See Provider within 72 Hours Reason for Outcome: Evaluated by provider AND no improvement in symptoms after following treatment plan for the time specified by provider Care Advice: ~ Use a cool mist humidifier to moisten air. Be  sure to clean according to manufacturer's instructions. ~ Continue to follow treatment plan, including medications, until evaluated by provider. Drink more fluids -- water, low-sugar juices, tea and warm soup, especially chicken broth, are options. Avoid caffeinated or alcoholic beverages because they can increase the chance of dehydration. ~ ~ SYMPTOM / CONDITION MANAGEMENT Total water intake includes drinking water, water in beverages, and water contained in food. Fluids make up about 80% of the body's total hydration need. Individual fluid requirement to maintain hydration vary based on physical activity, environmental factors and illness. Limit fluids that contain sugar, caffeine, or alcohol. Urine will be very light yellow color when you drink enough fluids. ~ 01/28/2013 5:03:55PM Page 1 of 1 [email or web address] [facility] Triage Call Report Triage Record Num: [id number] Operator: Patriciaann Clan Patient Name: Patricia Horne Call Date & Time: 01/28/2013 4:42:55PM Patient Phone: [phone] PCP: Kerby Nora Patient Gender: Female PCP Fax : [phone] Patient DOB: 04-10-1950 [facility] Reason for Call: Caller: Erilyn/Patient; PCP: Kerby Nora (Family Practice); CB#: [phone]; Call regarding Cough/Congestion; Patient states she developed cough, fever, congestion, aching. Onset 01/26/13. Patient states seh was seen at [facility] and diagnosed with Viral illness. Patient states she was prescribed Tamiflu and Tussionex. Patient states she has been afebrile since 11/27/12 a.m. Patient states she developed intermittent pain in right mid back and bilateral anterior rib pain. Onset 01/26/13. Patient states cough and congestion have improved. States cough is non-productive. Patient denies pain with deep inspiration. States anterior rib and mid back pain increases with movement and cough. Denies chest pain. Triage per Cough, Chest Pain and Flu-Like sx  Protocol. No emergent sx identified. Disposition of " See Provider within 72 hours" obtained related to positive triage assessment for " Evaluated by Provider and has questions/concerns." Care advice given per guidelines. Patient advised increased fluids,  warm fluids, inhaled steam, humidifier. Patient advised Tylenol 2 tablets q 4 hours as needed for pain. Patient declines offered appt. for 01/29/13. Patient states she will return the call for an appt. if sx persist. Call back parameters reviewed. Patient verbalizes understanding. Protocol(s) Used: Chest Pain Recommended Outcome per Protocol: Call Provider within 72 Hours Reason for Outcome: Evaluated by provider and has question/concern about their condition, treatment plan, treatment options, follow-up appointments, or other follow-up care. Care Advice: ~ HEALTH PROMOTION / MAINTENANCE Continue to follow your treatment plan until seeing or talking with your provider. Take all medications as prescribed. If you have questions or concerns about your treatment plan, ask about them when you call for your appointment. ~ Safe Use of Medications: - Keep a list of your medications, listing both brand and generic names, the prescribed dosage, common side effects, recommended action for side effects, when to call their provider, and any other specific instructions. This medication list should be updated if any prescriptions change or if new medications are added. Your list should include nonprescription medications, vitamins and supplements. Ask about interaction with other medications, supplements or foods. - Take the medication list to each provider visit, especially if seeing more than one doctor. Make note of any known allergies on this list. - Use your medication exactly as directed. Any concerns about side effects should be discussed with your pharmacist or prescribing provider before changing doses or stopping the medication. - Don't chew  or crush tablets or open capsules unless told to do so. Some long-acting medications can be absorbed too quickly when chewed or crushed. Other medications either won't be effective or could cause side effects. ~ 01/28/2013 5:03:57PM Page 1 of 2 [email or web address] [facility] Triage Call Report Patient Name: Uniqua Kihn continuation page/s - If you have difficulty swallowing pills, ask your provider or the pharmacist if the medication is available in liquid form. - For liquid medications, only use measuring device that came with it or was provided by the pharmacy. Household teaspoons and tablespoons can be inaccurate. - Never take anyone else's medication. 01/28/2013 5:03:57PM Page 2 of 2 [email or web address] [facility] Triage Call Report Triage Record Num: [id number] Operator: Patriciaann Clan Patient Name: Cythnia Osmun Call Date & Time: 01/28/2013 4:42:55PM Patient Phone: [phone] PCP: Kerby Nora Patient Gender: Female PCP Fax : [phone] Patient DOB: 1951-01-12 [facility] Reason for Call: Caller: Samika/Patient; PCP: Kerby Nora (Family Practice); CB#: [phone]; Call regarding Cough/Congestion; Patient states she developed cough, fever, congestion, aching. Onset 01/26/13. Patient states seh was seen at [facility] and diagnosed with Viral illness. Patient states she was prescribed Tamiflu and Tussionex. Patient states she has been afebrile since 11/27/12 a.m. Patient states she developed intermittent pain in right mid back and bilateral anterior rib pain. Onset 01/26/13. Patient states cough and congestion have improved. States cough is non-productive. Patient denies pain with deep inspiration. States anterior rib and mid back pain increases with movement and cough. Denies chest pain. Triage per Cough, Chest Pain and Flu-Like sx Protocol. No emergent sx identified. Disposition of " See Provider within 72 hours" obtained related to positive triage  assessment for " Evaluated by Provider and has questions/concerns." Care advice given per guidelines. Patient advised increased fluids, warm fluids, inhaled steam, humidifier. Patient advised Tylenol 2 tablets q 4 hours as needed for pain. Patient declines offered appt. for 01/29/13. Patient states she will return the call for an appt. if sx persist. Call back parameters reviewed. Patient verbalizes understanding.  Protocol(s) Used: Flu-Like Symptoms Recommended Outcome per Protocol: Provide Home/Self Care Reason for Outcome: Diagnosed with influenza by provider AND has questions/concerns Care Advice: ~ 01/28/2013 5:03:59PM Page 1 of 1 [email or web address]

## 2013-01-29 NOTE — Telephone Encounter (Signed)
Noted  

## 2013-02-13 ENCOUNTER — Ambulatory Visit: Payer: BC Managed Care – PPO | Admitting: Family Medicine

## 2013-02-19 ENCOUNTER — Encounter: Payer: Self-pay | Admitting: Internal Medicine

## 2013-02-19 ENCOUNTER — Ambulatory Visit (INDEPENDENT_AMBULATORY_CARE_PROVIDER_SITE_OTHER): Payer: BC Managed Care – PPO | Admitting: Internal Medicine

## 2013-02-19 VITALS — BP 130/90 | HR 87 | Temp 97.7°F | Resp 12 | Wt 170.8 lb

## 2013-02-19 DIAGNOSIS — IMO0002 Reserved for concepts with insufficient information to code with codable children: Secondary | ICD-10-CM

## 2013-02-19 DIAGNOSIS — E1165 Type 2 diabetes mellitus with hyperglycemia: Secondary | ICD-10-CM

## 2013-02-19 DIAGNOSIS — IMO0001 Reserved for inherently not codable concepts without codable children: Secondary | ICD-10-CM

## 2013-02-19 LAB — HEMOGLOBIN A1C: HEMOGLOBIN A1C: 12.3 % — AB (ref 4.6–6.5)

## 2013-02-19 MED ORDER — METFORMIN HCL 1000 MG PO TABS: 1000.0000 mg | ORAL_TABLET | Freq: Two times a day (BID) | ORAL | Status: DC

## 2013-02-19 MED ORDER — GLIPIZIDE 10 MG PO TABS: 10.0000 mg | ORAL_TABLET | Freq: Two times a day (BID) | ORAL | Status: DC

## 2013-02-19 NOTE — Patient Instructions (Signed)
Please start Metformin 500 mg with dinner x 4 days. If you tolerate this well, add another Metformin tablet (500 mg) with breakfast x 4 days. If you tolerate this well, add another metformin tablet with dinner (total 1000 mg) x 4 days. If you tolerate this well, add another metformin tablets with breakfast (total 1000 mg). Continue with 1000 mg of metformin twice a day with breakfast and dinner.  Please start Glipizide 5 mg 2x a day before meals.  Please return in 1 month with your sugar log.   Please stop at the lab.

## 2013-02-19 NOTE — Progress Notes (Signed)
Patient ID: Patricia Horne, female   DOB: Sep 09, 1950, 63 y.o.   MRN: [id number]  HPI: Patricia Horne is a 63 y.o.-year-old female, for f/u for DM2, dx 2006, insulin-dependent (refusing insulin), uncontrolled, without complications and also for HTN. Last visit 1 mo ago.  DM2; Patient started insulin within last year, but did not take it consistently. Last hemoglobin A1c was: Lab Results  Component Value Date   HGBA1C 11.5* 10/28/2012   HGBA1C 13.7* 07/23/2012   HGBA1C 11.8* 04/17/2012  Compliance is main problem.  Pt should be on a regimen of: - Metformin 500 mg po bid - not taking, does not like the taste - Levemir 20 units hs - not taking She gets nauseated if taking meds (regardless of the type), also gets nauseated only thinking about taking the meds.  Pt started to check sugars every once in a while - no log: - am: 150-230- occasionally 137 (lowest) >> 200-300 - 2h after dinner: 200-250 >> n/c No lows.? hypoglycemia awareness.  Highest 323. Lowest 205.  She does not exercise.  - no CKD, last BUN/creatinine:  Lab Results  Component Value Date   BUN 14 07/23/2012   CREATININE 0.6 07/23/2012  Last ACR was 0.3 on 09/11/2012.  - last set of lipids: Lab Results  Component Value Date   CHOL 140 07/23/2012   HDL 44.90 07/23/2012   LDLCALC 72 07/23/2012   TRIG 118.0 07/23/2012   CHOLHDL 3 07/23/2012   - last eye exam was July 2014. No DR. Has glaucoma. Noncompliant with drops.  - no numbness and tingling in her feet. Last diabetic foot exam was 08/20/12 >> normal.  HTN: Pt BPs at home are <140/90 usually. She was advised to start Norvasc 10 >> occasionally takes it.  Today's BP: 130/90.  I reviewed pt's medications, allergies, PMH, social hx, family hx and no changes required, except as mentioned above.  ROS: Constitutional: no weight gain/loss, + fatigue, no subjective hyperthermia/hypothermia, + poor sleep, + fever/chills Eyes: no blurry vision, no xerophthalmia ENT: no sore throat, no  nodules palpated in throat, no dysphagia/odynophagia, no hoarseness Cardiovascular: no CP/SOB/palpitations/leg swelling Respiratory: + cough/no SOB Gastrointestinal: no N/V/D/C Musculoskeletal: no muscle/joint aches Skin: no rashes Neurological: no tremors/numbness/tingling/dizziness  I reviewed pt's medications, allergies, PMH, social hx, family hx and no changes required, except as mentioned above.  PE: BP 130/90  Pulse 87  Temp(Src) 97.7 F (36.5 C) (Oral)  Resp 12  Wt 170 lb 12.8 oz (77.474 kg)  SpO2 95% Wt Readings from Last 3 Encounters:  02/19/13 170 lb 12.8 oz (77.474 kg)  01/08/13 173 lb 1.9 oz (78.527 kg)  11/29/12 175 lb (79.379 kg)   Constitutional: overweight, in NAD Eyes: PERRLA, EOMI, no exophthalmos ENT: moist mucous membranes, no thyromegaly, no cervical lymphadenopathy Cardiovascular: RRR, No MRG Respiratory: CTA B Gastrointestinal: abdomen soft, NT, ND, BS+ Musculoskeletal: no deformities, strength intact in all 4 Skin: moist, warm, no rashes  ASSESSMENT: 1. DM2, insulin-dependent, uncontrolled, without complications - issue: noncompliance with meds and CBG checks  2. HTN -  on Amlodipine 10 prn  PLAN:  1. DM2 Patient with long-standing, uncontrolled diabetes due to noncompliance. She does not take her long acting insulin + or the Metformin! She is interested in [place], but I do not think this is a good choice in her as this would dehydrate her more  - Strongly advised her to start taking her meds and restart checking sugars: Patient Instructions  Please start Metformin 500 mg with dinner x  4 days. If you tolerate this well, add another Metformin tablet (500 mg) with breakfast x 4 days. If you tolerate this well, add another metformin tablet with dinner (total 1000 mg) x 4 days. If you tolerate this well, add another metformin tablets with breakfast (total 1000 mg). Continue with 1000 mg of metformin twice a day with breakfast and dinner.  Please  start Glipizide 5 mg 2x a day before meals.  Please return in 1 month with your sugar log.   Please stop at the lab.  - I advised her that if she continues to be noncompliant with sugar checks and meds, I will not be able to see her anymore since the visits are not benefiting her - I will call in brand name Glucophage - advised to bring sugar log at next appt  - given new logs - had flu vaccine this season - she is up to date with eye exams - will check a HbA1c today  - Return to clinic in 1 month with sugar log   2. HTN - on Norvasc >> sBP high >> encouraged compliance - can continue Norvasc but will likely need ACEI for renal protection in the future   Office Visit on 02/19/2013  Component Date Value Range Status  . Hemoglobin A1C 02/19/2013 12.3* 4.6 - 6.5 % Final   Glycemic Control Guidelines for People with Diabetes:Non Diabetic:  <6%Goal of Therapy: <7%Additional Action Suggested:  >8%    HbA1c higher.

## 2013-03-11 ENCOUNTER — Encounter: Payer: BC Managed Care – PPO | Admitting: Family Medicine

## 2013-03-26 ENCOUNTER — Ambulatory Visit: Payer: BC Managed Care – PPO | Admitting: Internal Medicine

## 2013-05-07 ENCOUNTER — Ambulatory Visit: Payer: BC Managed Care – PPO | Admitting: Internal Medicine

## 2013-05-30 ENCOUNTER — Encounter: Payer: BC Managed Care – PPO | Admitting: Family Medicine

## 2013-07-31 ENCOUNTER — Ambulatory Visit (INDEPENDENT_AMBULATORY_CARE_PROVIDER_SITE_OTHER): Payer: BC Managed Care – PPO | Admitting: Family Medicine

## 2013-07-31 ENCOUNTER — Encounter: Payer: Self-pay | Admitting: Family Medicine

## 2013-07-31 VITALS — BP 160/80 | HR 80 | Temp 98.2°F | Ht 61.0 in | Wt 170.2 lb

## 2013-07-31 DIAGNOSIS — J069 Acute upper respiratory infection, unspecified: Secondary | ICD-10-CM

## 2013-07-31 DIAGNOSIS — Z0000 Encounter for general adult medical examination without abnormal findings: Secondary | ICD-10-CM

## 2013-07-31 DIAGNOSIS — IMO0001 Reserved for inherently not codable concepts without codable children: Secondary | ICD-10-CM

## 2013-07-31 DIAGNOSIS — Z91148 Patient's other noncompliance with medication regimen for other reason: Secondary | ICD-10-CM | POA: Insufficient documentation

## 2013-07-31 DIAGNOSIS — B9789 Other viral agents as the cause of diseases classified elsewhere: Secondary | ICD-10-CM

## 2013-07-31 DIAGNOSIS — Z9114 Patient's other noncompliance with medication regimen: Secondary | ICD-10-CM | POA: Insufficient documentation

## 2013-07-31 DIAGNOSIS — Z9119 Patient's noncompliance with other medical treatment and regimen: Secondary | ICD-10-CM

## 2013-07-31 DIAGNOSIS — I10 Essential (primary) hypertension: Secondary | ICD-10-CM

## 2013-07-31 DIAGNOSIS — H6120 Impacted cerumen, unspecified ear: Secondary | ICD-10-CM | POA: Insufficient documentation

## 2013-07-31 DIAGNOSIS — IMO0002 Reserved for concepts with insufficient information to code with codable children: Secondary | ICD-10-CM

## 2013-07-31 DIAGNOSIS — Z91199 Patient's noncompliance with other medical treatment and regimen due to unspecified reason: Secondary | ICD-10-CM

## 2013-07-31 DIAGNOSIS — E1165 Type 2 diabetes mellitus with hyperglycemia: Secondary | ICD-10-CM

## 2013-07-31 DIAGNOSIS — H6123 Impacted cerumen, bilateral: Secondary | ICD-10-CM

## 2013-07-31 LAB — HM DIABETES FOOT EXAM

## 2013-07-31 MED ORDER — HYDROCOD POLST-CHLORPHEN POLST 10-8 MG/5ML PO LQCR: 5.0000 mL | Freq: Every evening | ORAL | Status: DC | PRN

## 2013-07-31 NOTE — Assessment & Plan Note (Signed)
Simple irrigation

## 2013-07-31 NOTE — Assessment & Plan Note (Signed)
Poor control off amlodipine. Restart.

## 2013-07-31 NOTE — Assessment & Plan Note (Addendum)
Likely continued very poor control. Difficult to treat given noncompliance with meds and recommendations.  Check A1C. Return to see ENDO.

## 2013-07-31 NOTE — Assessment & Plan Note (Signed)
Resolving

## 2013-07-31 NOTE — Assessment & Plan Note (Signed)
Counseled pt that we cannot help her prevent health issues unless she can help herself by following recommendations.

## 2013-07-31 NOTE — Patient Instructions (Addendum)
I strongly recommend you returning to see Dr. Letta Median. Restart amlodipine for BP. Schedule labs  For fasting in next few weeks. Look into coverage of pneumonia vaccines and shingles vaccines.  Call to schedule mammogram on you own. Set up yearly eye exam.

## 2013-07-31 NOTE — Addendum Note (Signed)
Addended byEliezer Lofts E on: 07/31/2013 03:08 PM   Modules accepted: Orders

## 2013-07-31 NOTE — Progress Notes (Signed)
HPI  The patient is here for annual wellness exam and preventative care.    She feels well overall.  Cough,clear productive  and congestion x 2 weeks.  No SOB, no wheezing.  No fever  Ears feel full. Getting better with time. Using tussionex at night , helps.  Diabetes, poor control: Followed by ENDO, note reviewed. Lab Results  Component Value Date   HGBA1C 12.3* 02/19/2013  Hypoglycemic episodes:None  Hyperglycemic episodes: yes  Feet problems:None  Blood Sugars averaging: 240-271  Wt Readings from Last 3 Encounters:  07/31/13 170 lb 4 oz (77.225 kg)  02/19/13 170 lb 12.8 oz (77.474 kg)  01/08/13 173 lb 1.9 oz (78.527 kg)  Due for  Eye exam.  Hypertension: previously well controlled on amlodipine< currently not taking amlodipine BP Readings from Last 3 Encounters:  07/31/13 160/80  02/19/13 130/90  01/08/13 132/96  Using medication without problems or lightheadedness: None  Chest pain with exertion:None  Edema:None  Short of breath:None  Average home BPs: not checking  Other issues:   LDL at goal on no med last check. Lab Results  Component Value Date   CHOL 140 07/23/2012   HDL 44.90 07/23/2012   LDLCALC 72 07/23/2012   TRIG 118.0 07/23/2012   CHOLHDL 3 07/23/2012     Review of Systems  Constitutional: Negative for fever and fatigue.  HENT: Negative for ear pain.  Eyes: Negative for pain.  Respiratory: Negative for chest tightness and shortness of breath.  Cardiovascular: Negative for chest pain, palpitations and leg swelling.  Gastrointestinal: Negative for abdominal pain.  Genitourinary: Negative for dysuria.  Objective:   Physical Exam  Constitutional: Vital signs are normal. She appears well-developed and well-nourished. She is cooperative. Non-toxic appearance. She does not appear ill. No distress.  HENT:  Head: Normocephalic.  Right Ear: Hearing, tympanic membrane nml. Left Ear: Hearing, tympanic membrane normal.  B cerumen impaction Nose: Nose  normal.  Eyes: Conjunctivae normal, EOM and lids are normal. Pupils are equal, round, and reactive to light. No foreign bodies found.  Neck: Trachea normal and normal range of motion. Neck supple. Carotid bruit is not present. No mass and no thyromegaly present.  Cardiovascular: Normal rate, regular rhythm, S1 normal, S2 normal, normal heart sounds and intact distal pulses. Exam reveals no gallop.  No murmur heard.  Pulmonary/Chest: Effort normal and breath sounds normal. No respiratory distress. She has no wheezes. She has no rhonchi. She has no rales.  Abdominal: Soft. Normal appearance and bowel sounds are normal. She exhibits no distension, no fluid wave, no abdominal bruit and no mass. There is no hepatosplenomegaly. There is no tenderness. There is no rebound, no guarding and no CVA tenderness. No hernia.  Genitourinary: No breast swelling, tenderness, discharge or bleeding.  Lymphadenopathy:  She has no cervical adenopathy.  She has no axillary adenopathy.  Neurological: She is alert. She has normal strength. No cranial nerve deficit or sensory deficit.  Skin: Skin is warm, dry and intact. No rash noted.  Psychiatric: Her speech is normal and behavior is normal. Judgment normal. Her mood appears not anxious. Cognition and memory are normal. She does not exhibit a depressed mood.  Diabetic foot exam:  Normal inspection  No skin breakdown  No calluses  Normal DP pulses  Normal sensation to light touch and monofilament  Nails normal  Assessment & Plan:   The patient's preventative maintenance and recommended screening tests for an annual wellness exam were reviewed in full today.  Brought up to date  unless services declined.  Counselled on the importance of diet, exercise, and its role in overall health and mortality.  The patient's FH and SH was reviewed, including their home life, tobacco status, and drug and alcohol status.   Vaccine:Uptodate with Td, due for PNA, shingles  refused.   PAP/DVE: TAH, not indicated.  Mammo:Due DEXA; 2011 nml, repeat in 5 years  Colon: 2010 per pt nml... Repeat in 10 years.  Nonsmoker

## 2013-07-31 NOTE — Progress Notes (Signed)
Pre visit review using our clinic review tool, if applicable. No additional management support is needed unless otherwise documented below in the visit note. 

## 2013-08-04 ENCOUNTER — Telehealth: Payer: Self-pay | Admitting: Family Medicine

## 2013-08-04 NOTE — Telephone Encounter (Signed)
Relevant patient education assigned to patient using Emmi. ° °

## 2013-08-08 ENCOUNTER — Other Ambulatory Visit (INDEPENDENT_AMBULATORY_CARE_PROVIDER_SITE_OTHER): Payer: BC Managed Care – PPO

## 2013-08-08 ENCOUNTER — Telehealth: Payer: Self-pay

## 2013-08-08 DIAGNOSIS — IMO0001 Reserved for inherently not codable concepts without codable children: Secondary | ICD-10-CM

## 2013-08-08 DIAGNOSIS — E1165 Type 2 diabetes mellitus with hyperglycemia: Secondary | ICD-10-CM

## 2013-08-08 DIAGNOSIS — IMO0002 Reserved for concepts with insufficient information to code with codable children: Secondary | ICD-10-CM

## 2013-08-08 LAB — COMPREHENSIVE METABOLIC PANEL
ALBUMIN: 3.6 g/dL (ref 3.5–5.2)
ALK PHOS: 101 U/L (ref 39–117)
ALT: 14 U/L (ref 0–35)
AST: 18 U/L (ref 0–37)
BILIRUBIN TOTAL: 0.8 mg/dL (ref 0.2–1.2)
BUN: 12 mg/dL (ref 6–23)
CO2: 27 mEq/L (ref 19–32)
Calcium: 9.2 mg/dL (ref 8.4–10.5)
Chloride: 100 mEq/L (ref 96–112)
Creatinine, Ser: 0.6 mg/dL (ref 0.4–1.2)
GFR: 143.59 mL/min (ref >60.00)
Glucose, Bld: 316 mg/dL — ABNORMAL HIGH (ref 70–99)
Potassium: 3.7 mEq/L (ref 3.5–5.1)
SODIUM: 135 meq/L (ref 135–145)
Total Protein: 7.4 g/dL (ref 6.0–8.3)

## 2013-08-08 LAB — LIPID PANEL
Cholesterol: 150 mg/dL (ref 0–200)
HDL: 51.8 mg/dL (ref >39.00)
LDL Cholesterol: 76 mg/dL (ref 0–99)
NonHDL: 98.2
Total CHOL/HDL Ratio: 3
Triglycerides: 109 mg/dL (ref 0.0–149.0)
VLDL: 21.8 mg/dL (ref 0.0–40.0)

## 2013-08-08 LAB — HEMOGLOBIN A1C: Hgb A1c MFr Bld: 12.4 % — ABNORMAL HIGH (ref 4.6–6.5)

## 2013-08-08 MED ORDER — BENZONATATE 200 MG PO CAPS: 200.0000 mg | ORAL_CAPSULE | Freq: Two times a day (BID) | ORAL | Status: DC | PRN

## 2013-08-08 NOTE — Telephone Encounter (Signed)
Will send in benzonatate perles that she can use during the day

## 2013-08-08 NOTE — Telephone Encounter (Signed)
Pt was seen on 07/31/13; Tussionex makes pt very drowsy so only taking at hs. Tussionex helps for 3-4 hours and then prod cough with clear mucus flares up again. No fever. Pt wants to know if there is different med can take for cough.Cough is worse at night after laying down.[facility]. Please advise. Pt request cb.

## 2013-08-08 NOTE — Telephone Encounter (Signed)
Ms. Gallant notified Tessalon perle prescription has been called to her pharmacy.  Advised to use this during the day and save the Tussinex for night time use for cough.

## 2013-10-21 ENCOUNTER — Ambulatory Visit: Payer: Self-pay | Admitting: Family Medicine

## 2013-10-22 ENCOUNTER — Encounter: Payer: Self-pay | Admitting: Family Medicine

## 2013-12-15 ENCOUNTER — Other Ambulatory Visit: Payer: Self-pay

## 2013-12-15 MED ORDER — AMLODIPINE BESYLATE 10 MG PO TABS: 10.0000 mg | ORAL_TABLET | Freq: Every day | ORAL | Status: DC

## 2013-12-15 NOTE — Telephone Encounter (Signed)
Pt left v/m requesting refill amlodipine to [facility]. Pt has been taking med for 3 weeks this time; pt has appt 12/22/13. Advised pt done.

## 2013-12-18 ENCOUNTER — Ambulatory Visit: Payer: BC Managed Care – PPO | Admitting: Family Medicine

## 2013-12-22 ENCOUNTER — Encounter: Payer: Self-pay | Admitting: Family Medicine

## 2013-12-22 ENCOUNTER — Ambulatory Visit (INDEPENDENT_AMBULATORY_CARE_PROVIDER_SITE_OTHER): Payer: BC Managed Care – PPO | Admitting: Family Medicine

## 2013-12-22 VITALS — BP 146/88 | HR 91 | Temp 98.2°F | Ht 61.0 in | Wt 169.5 lb

## 2013-12-22 DIAGNOSIS — IMO0002 Reserved for concepts with insufficient information to code with codable children: Secondary | ICD-10-CM

## 2013-12-22 DIAGNOSIS — Z9114 Patient's other noncompliance with medication regimen: Secondary | ICD-10-CM

## 2013-12-22 DIAGNOSIS — E1165 Type 2 diabetes mellitus with hyperglycemia: Secondary | ICD-10-CM

## 2013-12-22 DIAGNOSIS — I10 Essential (primary) hypertension: Secondary | ICD-10-CM

## 2013-12-22 LAB — HM DIABETES FOOT EXAM

## 2013-12-22 NOTE — Assessment & Plan Note (Signed)
Improved control back on amlodipine.

## 2013-12-22 NOTE — Progress Notes (Signed)
63 year old female presetns for DM follow up. She had recently been seeing Dr Cruzita Lederer. With ENDO but has not seen since 01/2013  Diabetes, poor control: Due for re-eval. She is on no medication. She is not interested in returning to see ENDO. Lab Results  Component Value Date   HGBA1C 12.4* 08/08/2013  Hypoglycemic episodes:None  Hyperglycemic episodes: yes  Feet problems:None  Blood Sugars averaging: 300 Wt Readings from Last 3 Encounters:  12/22/13 169 lb 8 oz (76.885 kg)  07/31/13 170 lb 4 oz (77.225 kg)  02/19/13 170 lb 12.8 oz (77.474 kg)  Due for Eye exam.  Hypertension: Improved control back on amlodipine. Feeling better overall.  Did not take BP med today yet. BP Readings from Last 3 Encounters:  12/22/13 146/88  07/31/13 160/80  02/19/13 130/90  Using medication without problems or lightheadedness: None  Chest pain with exertion:None  Edema:None  Short of breath:None  Average home BPs: not checking  Other issues:   LDL at goal on no med last check. Lab Results  Component Value Date   CHOL 150 08/08/2013   HDL 51.80 08/08/2013   LDLCALC 76 08/08/2013   TRIG 109.0 08/08/2013   CHOLHDL 3 08/08/2013       Review of Systems  Constitutional: Negative for fever and fatigue.  HENT: Negative for ear pain.  Eyes: Negative for pain.  Respiratory: Negative for chest tightness and shortness of breath.  Cardiovascular: Negative for chest pain, palpitations and leg swelling.  Gastrointestinal: Negative for abdominal pain.  Genitourinary: Negative for dysuria.  Objective:   Physical Exam  Constitutional: Vital signs are normal. She appears well-developed and well-nourished. She is cooperative. Non-toxic appearance. She does not appear ill. No distress.  HENT:  Head: Normocephalic.  Right Ear: Hearing, tympanic membrane nml. Left Ear: Hearing, tympanic membrane normal. B cerumen impaction Nose: Nose normal.  Eyes: Conjunctivae normal, EOM and lids  are normal. Pupils are equal, round, and reactive to light. No foreign bodies found.  Neck: Trachea normal and normal range of motion. Neck supple. Carotid bruit is not present. No mass and no thyromegaly present.  Cardiovascular: Normal rate, regular rhythm, S1 normal, S2 normal, normal heart sounds and intact distal pulses. Exam reveals no gallop.  No murmur heard.  Pulmonary/Chest: Effort normal and breath sounds normal. No respiratory distress. She has no wheezes. She has no rhonchi. She has no rales.  Abdominal: Soft. Normal appearance and bowel sounds are normal. She exhibits no distension, no fluid wave, no abdominal bruit and no mass. There is no hepatosplenomegaly. There is no tenderness. There is no rebound, no guarding and no CVA tenderness. No hernia.  Genitourinary: No breast swelling, tenderness, discharge or bleeding.  Lymphadenopathy:  She has no cervical adenopathy.  She has no axillary adenopathy.  Neurological: She is alert. She has normal strength. No cranial nerve deficit or sensory deficit.  Skin: Skin is warm, dry and intact. No rash noted.  Psychiatric: Her speech is normal and behavior is normal. Judgment normal. Her mood appears not anxious. Cognition and memory are normal. She does not exhibit a depressed mood.  Diabetic foot exam:  Normal inspection  No skin breakdown  No calluses  Normal DP pulses  Normal sensation to light touch and monofilament  Nails normal

## 2013-12-22 NOTE — Progress Notes (Signed)
Pre visit review using our clinic review tool, if applicable. No additional management support is needed unless otherwise documented below in the visit note. 

## 2013-12-22 NOTE — Assessment & Plan Note (Signed)
Poor control on no med. Restart levemir and titrate up. Follow up with A1c in 3 months.

## 2013-12-22 NOTE — Patient Instructions (Addendum)
Start levemir 20 Units daily. Check every morning fasting, if not at goal after 2-3 days < 120 fasting, then incrase levemir by 2 Units. Bring in blood sugar measurements. Increase exercise and work on healthy eating.  Set up eye exam. Follow up up in 3 months with labs prior for CPX.Marland Kitchen

## 2014-01-14 ENCOUNTER — Telehealth: Payer: Self-pay

## 2014-01-14 NOTE — Telephone Encounter (Signed)
Pt left v/m; pt has recently started levemir; pt having problems with constipation;pt had last BM 01/10/14 prior to that no BM for 5 days; pt said takes exlax or takes metformin 1000 mg as laxative.pt request cb.[facility].

## 2014-01-15 NOTE — Telephone Encounter (Signed)
Notify pt that levemir does not commonly cause constipation. She needs to stay on it. Start miralax daily until BM. Increase water, fiber and exercise.

## 2014-01-15 NOTE — Telephone Encounter (Signed)
Left message for Patricia Horne to return my call. 

## 2014-01-15 NOTE — Telephone Encounter (Signed)
Ms. Scollard notified as instructed by telephone.  She states she will get some Miralax and try.

## 2014-01-28 ENCOUNTER — Other Ambulatory Visit: Payer: Self-pay | Admitting: *Deleted

## 2014-01-28 NOTE — Telephone Encounter (Signed)
Faxed request asking for Levemir flexpen, which is not on pt's medication list, discontinued by Dr. Renne Crigler 01/2013. Please advise

## 2014-01-28 NOTE — Telephone Encounter (Signed)
Read Dr. Sharmaine Base last note. Refill it.

## 2014-01-29 MED ORDER — INSULIN DETEMIR 100 UNIT/ML ~~LOC~~ SOLN: 20.0000 [IU] | Freq: Every day | SUBCUTANEOUS | Status: DC

## 2014-01-29 NOTE — Telephone Encounter (Signed)
rx sent electronically with directions per Dr. Rometta Emery last [place] note.

## 2014-02-02 ENCOUNTER — Other Ambulatory Visit: Payer: Self-pay | Admitting: *Deleted

## 2014-02-02 MED ORDER — GLUCOSE BLOOD VI STRP: ORAL_STRIP | Status: DC

## 2014-02-10 ENCOUNTER — Encounter: Payer: Self-pay | Admitting: Family Medicine

## 2014-02-10 ENCOUNTER — Ambulatory Visit (INDEPENDENT_AMBULATORY_CARE_PROVIDER_SITE_OTHER): Payer: BC Managed Care – PPO | Admitting: Family Medicine

## 2014-02-10 VITALS — BP 122/76 | HR 104 | Temp 97.8°F | Ht 61.0 in | Wt 167.5 lb

## 2014-02-10 DIAGNOSIS — R140 Abdominal distension (gaseous): Secondary | ICD-10-CM | POA: Insufficient documentation

## 2014-02-10 DIAGNOSIS — E1165 Type 2 diabetes mellitus with hyperglycemia: Secondary | ICD-10-CM

## 2014-02-10 DIAGNOSIS — IMO0002 Reserved for concepts with insufficient information to code with codable children: Secondary | ICD-10-CM

## 2014-02-10 DIAGNOSIS — K5909 Other constipation: Secondary | ICD-10-CM | POA: Insufficient documentation

## 2014-02-10 DIAGNOSIS — K589 Irritable bowel syndrome without diarrhea: Secondary | ICD-10-CM | POA: Insufficient documentation

## 2014-02-10 DIAGNOSIS — K5900 Constipation, unspecified: Secondary | ICD-10-CM

## 2014-02-10 MED ORDER — METOCLOPRAMIDE HCL 5 MG PO TABS: 5.0000 mg | ORAL_TABLET | Freq: Three times a day (TID) | ORAL | Status: DC | PRN

## 2014-02-10 NOTE — Assessment & Plan Note (Signed)
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 Pt does not want to return to GI for eval unless necessary.  Will do trial of reglan.  Can try probiotic for bowel reguarity.

## 2014-02-10 NOTE — Progress Notes (Signed)
   Subjective:    Patient ID: Patricia Horne, female    DOB: [birth year], 64 y.o.   MRN: [id number]  HPI 64 year old female with poorly controlled diabetes  presents for constipation and bloating. She has had this for a while, but worse in last week.   She is now on  levemir 30 units daily. Her CBGs have been running.  She has not had any relief from miralax, has tried dulcolax,.took a metforomin with a BM 3 days ago. She has had less appetite. She is burping, gas.  Bloating worse after eating. No specific foods make it worse.  She states her FBS 150-175.  BP Readings from Last 3 Encounters:  02/10/14 122/76  12/22/13 146/88  07/31/13 160/80      Review of Systems  Constitutional: Negative for fever and fatigue.  HENT: Negative for ear pain.   Eyes: Negative for pain.  Respiratory: Negative for chest tightness and shortness of breath.   Cardiovascular: Negative for chest pain, palpitations and leg swelling.  Gastrointestinal: Negative for abdominal pain.  Genitourinary: Negative for dysuria.       Objective:   Physical Exam  Constitutional: Vital signs are normal. She appears well-developed and well-nourished. She is cooperative.  Non-toxic appearance. She does not appear ill. No distress.  HENT:  Head: Normocephalic.  Right Ear: Hearing, tympanic membrane, external ear and ear canal normal. Tympanic membrane is not erythematous, not retracted and not bulging.  Left Ear: Hearing, tympanic membrane, external ear and ear canal normal. Tympanic membrane is not erythematous, not retracted and not bulging.  Nose: No mucosal edema or rhinorrhea. Right sinus exhibits no maxillary sinus tenderness and no frontal sinus tenderness. Left sinus exhibits no maxillary sinus tenderness and no frontal sinus tenderness.  Mouth/Throat: Uvula is midline, oropharynx is clear and moist and mucous membranes are normal.  Eyes: Conjunctivae, EOM and lids are normal. Pupils are equal, round, and  reactive to light. Lids are everted and swept, no foreign bodies found.  Neck: Trachea normal and normal range of motion. Neck supple. Carotid bruit is not present. No thyroid mass and no thyromegaly present.  Cardiovascular: Normal rate, regular rhythm, S1 normal, S2 normal, normal heart sounds, intact distal pulses and normal pulses.  Exam reveals no gallop and no friction rub.   No murmur heard. Pulmonary/Chest: Effort normal and breath sounds normal. No tachypnea. No respiratory distress. She has no decreased breath sounds. She has no wheezes. She has no rhonchi. She has no rales.  Abdominal: Soft. Normal appearance and bowel sounds are normal. She exhibits distension. She exhibits no shifting dullness, no pulsatile liver, no fluid wave, no abdominal bruit and no pulsatile midline mass. There is no tenderness. There is no rigidity, no rebound, no guarding and no CVA tenderness. No hernia.  Neurological: She is alert.  Skin: Skin is warm, dry and intact. No rash noted.  Psychiatric: Her speech is normal and behavior is normal. Judgment and thought content normal. Her mood appears not anxious. Cognition and memory are normal. She does not exhibit a depressed mood.          Assessment & Plan:

## 2014-02-10 NOTE — Assessment & Plan Note (Signed)
Use laxative, water, fiber and exercise.

## 2014-02-10 NOTE — Progress Notes (Signed)
Pre visit review using our clinic review tool, if applicable. No additional management support is needed unless otherwise documented below in the visit note. 

## 2014-02-10 NOTE — Assessment & Plan Note (Signed)
Increase levemir gradually until goal FBS <120.

## 2014-02-10 NOTE — Patient Instructions (Addendum)
Continue to increase insullin as discussed previously until FBS < 120. Stat probiotics daily. Can use reglan as needed for nausea, bloating symtpoms. Continue dulcolax as needed for constipation.  Increase fiber and water and exercise.

## 2014-02-16 ENCOUNTER — Other Ambulatory Visit: Payer: Self-pay

## 2014-02-16 MED ORDER — INSULIN DETEMIR 100 UNIT/ML FLEXPEN: PEN_INJECTOR | SUBCUTANEOUS | Status: DC

## 2014-02-16 NOTE — Telephone Encounter (Signed)
Pt request pen instead of vial of levemir to [facility]. [place] [facility] said no longer takes pen out of box so request 64ml. Advised pt and verified how pt is taking levemir. Refill sent as requested.

## 2014-03-12 ENCOUNTER — Telehealth: Payer: Self-pay | Admitting: Family Medicine

## 2014-03-12 ENCOUNTER — Encounter: Payer: Self-pay | Admitting: Family Medicine

## 2014-03-12 ENCOUNTER — Ambulatory Visit (INDEPENDENT_AMBULATORY_CARE_PROVIDER_SITE_OTHER): Payer: BC Managed Care – PPO | Admitting: Family Medicine

## 2014-03-12 VITALS — BP 170/100 | HR 79 | Temp 97.8°F | Ht 61.0 in | Wt 169.8 lb

## 2014-03-12 DIAGNOSIS — R109 Unspecified abdominal pain: Secondary | ICD-10-CM

## 2014-03-12 DIAGNOSIS — G8929 Other chronic pain: Secondary | ICD-10-CM | POA: Insufficient documentation

## 2014-03-12 DIAGNOSIS — I10 Essential (primary) hypertension: Secondary | ICD-10-CM

## 2014-03-12 DIAGNOSIS — K5909 Other constipation: Secondary | ICD-10-CM

## 2014-03-12 DIAGNOSIS — E1165 Type 2 diabetes mellitus with hyperglycemia: Secondary | ICD-10-CM

## 2014-03-12 DIAGNOSIS — K5900 Constipation, unspecified: Secondary | ICD-10-CM

## 2014-03-12 DIAGNOSIS — IMO0002 Reserved for concepts with insufficient information to code with codable children: Secondary | ICD-10-CM

## 2014-03-12 DIAGNOSIS — R140 Abdominal distension (gaseous): Secondary | ICD-10-CM

## 2014-03-12 DIAGNOSIS — Z9114 Patient's other noncompliance with medication regimen: Secondary | ICD-10-CM

## 2014-03-12 LAB — COMPREHENSIVE METABOLIC PANEL
ALT: 16 U/L (ref 0–35)
AST: 13 U/L (ref 0–37)
Albumin: 3.7 g/dL (ref 3.5–5.2)
Alkaline Phosphatase: 95 U/L (ref 39–117)
BUN: 12 mg/dL (ref 6–23)
CALCIUM: 9.1 mg/dL (ref 8.4–10.5)
CO2: 31 mEq/L (ref 19–32)
Chloride: 104 mEq/L (ref 96–112)
Creatinine, Ser: 0.58 mg/dL (ref 0.40–1.20)
GFR: 134.8 mL/min (ref >60.00)
Glucose, Bld: 243 mg/dL — ABNORMAL HIGH (ref 70–99)
Potassium: 4.1 mEq/L (ref 3.5–5.1)
Sodium: 139 mEq/L (ref 135–145)
TOTAL PROTEIN: 7 g/dL (ref 6.0–8.3)
Total Bilirubin: 0.7 mg/dL (ref 0.2–1.2)

## 2014-03-12 LAB — CBC WITH DIFFERENTIAL/PLATELET
BASOS ABS: 0 10*3/uL (ref 0.0–0.1)
Basophils Relative: 0.4 % (ref 0.0–3.0)
Eosinophils Absolute: 0.1 10*3/uL (ref 0.0–0.7)
Eosinophils Relative: 1.7 % (ref 0.0–5.0)
HCT: 40.4 % (ref 36.0–46.0)
Hemoglobin: 13.9 g/dL (ref 12.0–15.0)
Lymphocytes Relative: 31.2 % (ref 12.0–46.0)
Lymphs Abs: 2.3 10*3/uL (ref 0.7–4.0)
MCHC: 34.4 g/dL (ref 30.0–36.0)
MCV: 92 fl (ref 78.0–100.0)
Monocytes Absolute: 0.4 10*3/uL (ref 0.1–1.0)
Monocytes Relative: 5.8 % (ref 3.0–12.0)
Neutro Abs: 4.4 10*3/uL (ref 1.4–7.7)
Neutrophils Relative %: 60.9 % (ref 43.0–77.0)
PLATELETS: 277 10*3/uL (ref 150.0–400.0)
RBC: 4.39 Mil/uL (ref 3.87–5.11)
RDW: 12.9 % (ref 11.5–15.5)
WBC: 7.3 10*3/uL (ref 4.0–10.5)

## 2014-03-12 LAB — TSH: TSH: 1.73 u[IU]/mL (ref 0.35–4.50)

## 2014-03-12 LAB — LIPID PANEL
Cholesterol: 146 mg/dL (ref 0–200)
HDL: 49.3 mg/dL (ref >39.00)
LDL Cholesterol: 76 mg/dL (ref 0–99)
NONHDL: 96.7
Total CHOL/HDL Ratio: 3
Triglycerides: 106 mg/dL (ref 0.0–149.0)
VLDL: 21.2 mg/dL (ref 0.0–40.0)

## 2014-03-12 LAB — LIPASE: LIPASE: 11 U/L (ref 11.0–59.0)

## 2014-03-12 LAB — HEMOGLOBIN A1C: Hgb A1c MFr Bld: 11.7 % — ABNORMAL HIGH (ref 4.6–6.5)

## 2014-03-12 NOTE — Assessment & Plan Note (Addendum)
With early satiety, blating, constipation and very poor control DM... DM gastroparesis is a high likelyhood.  She has not had colonoscopy since 2006... Recommend to eval for cancer etc. Stressed the importance of returning to GI.  Suggested while waiting for appt to try trial of reglan.

## 2014-03-12 NOTE — Assessment & Plan Note (Signed)
Push fluids, can use miralax daily.

## 2014-03-12 NOTE — Patient Instructions (Addendum)
Call to reschedule follow up appt with GI.  Can try trial of reglan.  Restart all medications.  Stop at lab on way out for fasting labs.

## 2014-03-12 NOTE — Assessment & Plan Note (Signed)
Poor control, restart meds. Pt refuses to return to ENDO. Will recheck labs today.

## 2014-03-12 NOTE — Progress Notes (Signed)
Pre visit review using our clinic review tool, if applicable. No additional management support is needed unless otherwise documented below in the visit note. 

## 2014-03-12 NOTE — Assessment & Plan Note (Signed)
Counsleed pt that she is high risk for CVA off med... Recommended restart BP meds.

## 2014-03-12 NOTE — Telephone Encounter (Signed)
Please send todays  and last 2 OV notes to GI appt.

## 2014-03-12 NOTE — Progress Notes (Signed)
Subjective:    Patient ID: Patricia Horne, female    DOB: 10-26-1950, 64 y.o.   MRN: [id number]  HPI   64 year old female pt with poorly controlled DM and HTN,  who recently stopped all her medication presents with  chronic onset abdominal pain, worsened .   She has a history of abdominal bloating, chronic constipation.   At last OV in 02/10/2014 felt possible diabetic gastroparesis.. We started her on a trial of reglan  and probiotic as she did not want to return to GI unless absolutely necessary.   Today she states that she has worsened central abdominal pain. Has nausea, early satiety, no emesis, no diarrhea. No dysuria. No fever.  Still with constipation. No blood in stool. She states that prune juice and castor oil helps BM some. She still feels she is moving stool through slowly, small infrequent BMs Never tried reglan. But she did not feel that probiotic helped any.   She has recently been sexually active with 16 year old partner, no pain with intercourse.   She stopped all her meds in last few days!! Made her feel a little better.  BP Readings from Last 3 Encounters:  03/12/14 170/100  02/10/14 122/76  12/22/13 146/88     Review of Systems  Constitutional: Negative for fever and fatigue.  HENT: Negative for ear pain.   Eyes: Negative for pain.  Respiratory: Negative for chest tightness and shortness of breath.   Cardiovascular: Negative for chest pain, palpitations and leg swelling.  Gastrointestinal: Positive for abdominal pain.  Genitourinary: Negative for dysuria.       Objective:   Physical Exam  Constitutional: Vital signs are normal. She appears well-developed and well-nourished. She is cooperative.  Non-toxic appearance. She does not appear ill. No distress.  HENT:  Head: Normocephalic.  Right Ear: Hearing, tympanic membrane, external ear and ear canal normal. Tympanic membrane is not erythematous, not retracted and not bulging.  Left Ear: Hearing, tympanic  membrane, external ear and ear canal normal. Tympanic membrane is not erythematous, not retracted and not bulging.  Nose: No mucosal edema or rhinorrhea. Right sinus exhibits no maxillary sinus tenderness and no frontal sinus tenderness. Left sinus exhibits no maxillary sinus tenderness and no frontal sinus tenderness.  Mouth/Throat: Uvula is midline, oropharynx is clear and moist and mucous membranes are normal.  Eyes: Conjunctivae, EOM and lids are normal. Pupils are equal, round, and reactive to light. Lids are everted and swept, no foreign bodies found.  Neck: Trachea normal and normal range of motion. Neck supple. Carotid bruit is not present. No thyroid mass and no thyromegaly present.  Cardiovascular: Normal rate, regular rhythm, S1 normal, S2 normal, normal heart sounds, intact distal pulses and normal pulses.  Exam reveals no gallop and no friction rub.   No murmur heard. Pulmonary/Chest: Effort normal and breath sounds normal. No tachypnea. No respiratory distress. She has no decreased breath sounds. She has no wheezes. She has no rhonchi. She has no rales.  Abdominal: Soft. Normal appearance and bowel sounds are normal. There is no hepatosplenomegaly. There is generalized tenderness. There is no rigidity, no rebound, no guarding, no CVA tenderness, no tenderness at McBurney's point and negative Murphy's sign.  Neurological: She is alert.  Skin: Skin is warm, dry and intact. No rash noted.  Psychiatric: Her speech is normal and behavior is normal. Judgment and thought content normal. Her mood appears not anxious. Cognition and memory are normal. She does not exhibit a depressed  mood.          Assessment & Plan:

## 2014-03-12 NOTE — Telephone Encounter (Signed)
Pt called to let you know she has an appointment with Doran Durand  PA @ [facility] Tuesday 03/17/13 @ 8:15  Ms Blakley said dr Diona Browner was going to send notes

## 2014-03-13 NOTE — Telephone Encounter (Signed)
Office notes and labs faxed to [facility] Attn: Doran Durand at [phone]

## 2014-03-19 ENCOUNTER — Other Ambulatory Visit: Payer: BC Managed Care – PPO

## 2014-03-26 ENCOUNTER — Encounter: Payer: Self-pay | Admitting: Family Medicine

## 2014-03-26 ENCOUNTER — Ambulatory Visit (INDEPENDENT_AMBULATORY_CARE_PROVIDER_SITE_OTHER): Payer: BC Managed Care – PPO | Admitting: Family Medicine

## 2014-03-26 VITALS — BP 163/76 | HR 87 | Temp 97.8°F | Ht 61.0 in | Wt 169.0 lb

## 2014-03-26 DIAGNOSIS — B351 Tinea unguium: Secondary | ICD-10-CM

## 2014-03-26 DIAGNOSIS — K5909 Other constipation: Secondary | ICD-10-CM

## 2014-03-26 DIAGNOSIS — I10 Essential (primary) hypertension: Secondary | ICD-10-CM

## 2014-03-26 DIAGNOSIS — R109 Unspecified abdominal pain: Secondary | ICD-10-CM

## 2014-03-26 DIAGNOSIS — Z9114 Patient's other noncompliance with medication regimen: Secondary | ICD-10-CM

## 2014-03-26 DIAGNOSIS — IMO0002 Reserved for concepts with insufficient information to code with codable children: Secondary | ICD-10-CM

## 2014-03-26 DIAGNOSIS — K5900 Constipation, unspecified: Secondary | ICD-10-CM

## 2014-03-26 DIAGNOSIS — E1165 Type 2 diabetes mellitus with hyperglycemia: Secondary | ICD-10-CM

## 2014-03-26 DIAGNOSIS — G8929 Other chronic pain: Secondary | ICD-10-CM

## 2014-03-26 LAB — HM DIABETES FOOT EXAM

## 2014-03-26 MED ORDER — TERBINAFINE HCL 250 MG PO TABS: 250.0000 mg | ORAL_TABLET | Freq: Every day | ORAL | Status: DC

## 2014-03-26 NOTE — Assessment & Plan Note (Signed)
Very poor control. Counsled on improving diet, decrease sweet fruits. Increase exercise , work on weight loss. Pt plans to restart Levemir, let he know it is okay to take in the morning.

## 2014-03-26 NOTE — Progress Notes (Signed)
Pre visit review using our clinic review tool, if applicable. No additional management support is needed unless otherwise documented below in the visit note. 

## 2014-03-26 NOTE — Assessment & Plan Note (Signed)
Encouraged pt to try [place]

## 2014-03-26 NOTE — Progress Notes (Signed)
Subjective:    Patient ID: Patricia Horne, female    DOB: Apr 07, 1950, 64 y.o.   MRN: [id number]  HPI  64 year old noncompliant  female patient  Presents for follow up DM and HTN.  She was mistakenly scheduled for CPX.Marland Kitchen She had this in 07/2013.  Recently saw [facility] for abdominal pain... Prescribed amitiza but she has not picked it up. Scheduled on Tuesday for colonoscopy.  Diabetes:  Poor control. Not taking any of her meds. Improved some from last OV.  Noncompliant with diet  ( eating alot of fruit and potatops) and exercise. Lab Results  Component Value Date   HGBA1C 11.7* 03/12/2014  Using medications without difficulties: she feels they constipated her Hypoglycemic episodes:none Hyperglycemic episodes: yes Feet problems:none Blood Sugars averaging: FBS  189 eye exam within last year: Due.  Wt Readings from Last 3 Encounters:  03/26/14 169 lb (76.658 kg)  03/12/14 169 lb 12 oz (76.998 kg)  02/10/14 167 lb 8 oz (75.978 kg)     Hypertension:  Poor control. She is taking amlodipine  10 mg but has not taken yet today.  BP Readings from Last 3 Encounters:  03/26/14 163/76  03/12/14 170/100  02/10/14 122/76  Using medication without problems or lightheadedness: None Chest pain with exertion:None Edema:None Short of breath:None Average home BPs: 160/86 Other issues:  Lab Results  Component Value Date   CHOL 146 03/12/2014   HDL 49.30 03/12/2014   LDLCALC 76 03/12/2014   TRIG 106.0 03/12/2014   CHOLHDL 3 03/12/2014        Review of Systems  Constitutional: Negative for fever and fatigue.  HENT: Negative for ear pain.   Eyes: Negative for pain.  Respiratory: Negative for chest tightness and shortness of breath.   Cardiovascular: Negative for chest pain, palpitations and leg swelling.  Gastrointestinal: Negative for abdominal pain.  Genitourinary: Negative for dysuria.       Objective:   Physical Exam  Constitutional: Vital signs are normal. She  appears well-developed and well-nourished. She is cooperative.  Non-toxic appearance. She does not appear ill. No distress.  HENT:  Head: Normocephalic.  Right Ear: Hearing, tympanic membrane, external ear and ear canal normal. Tympanic membrane is not erythematous, not retracted and not bulging.  Left Ear: Hearing, tympanic membrane, external ear and ear canal normal. Tympanic membrane is not erythematous, not retracted and not bulging.  Nose: No mucosal edema or rhinorrhea. Right sinus exhibits no maxillary sinus tenderness and no frontal sinus tenderness. Left sinus exhibits no maxillary sinus tenderness and no frontal sinus tenderness.  Mouth/Throat: Uvula is midline, oropharynx is clear and moist and mucous membranes are normal.  Eyes: Conjunctivae, EOM and lids are normal. Pupils are equal, round, and reactive to light. Lids are everted and swept, no foreign bodies found.  Neck: Trachea normal and normal range of motion. Neck supple. Carotid bruit is not present. No thyroid mass and no thyromegaly present.  Cardiovascular: Normal rate, regular rhythm, S1 normal, S2 normal, normal heart sounds, intact distal pulses and normal pulses.  Exam reveals no gallop and no friction rub.   No murmur heard. Pulmonary/Chest: Effort normal and breath sounds normal. No tachypnea. No respiratory distress. She has no decreased breath sounds. She has no wheezes. She has no rhonchi. She has no rales.  Abdominal: Normal appearance.  Neurological: She is alert.  Skin: Skin is warm, dry and intact. No rash noted.  Psychiatric: Her speech is normal and behavior is normal. Judgment and thought  content normal. Her mood appears not anxious. Cognition and memory are normal. She does not exhibit a depressed mood.   Diabetic foot exam: Normal inspection No skin breakdown No calluses  Normal DP pulses Normal sensation to light touch and monofilament Nail normal except new initial distal fungal infeciton in 1st  nail.        Assessment & Plan:

## 2014-03-26 NOTE — Assessment & Plan Note (Signed)
3 months of terbinafine

## 2014-03-26 NOTE — Assessment & Plan Note (Signed)
Likely poor control at home as well.. Pt will follow . We will need to add a med if remains > goal.

## 2014-03-26 NOTE — Assessment & Plan Note (Signed)
Per GI, pending colonoscopy.

## 2014-03-26 NOTE — Patient Instructions (Addendum)
Try to decrease bannas, oranges, pineapple, potato. Keep meals 30-45 g of sugar. Snack <15 grams. Can take insulin in mornings. Can try amitiza for constipation.  Schedule yearly eye exam.  Follow BP at home... If it is consistently up > 140/90, we will need to add a medicine. Use terbinafine daily for toenail infection.

## 2014-03-31 ENCOUNTER — Ambulatory Visit: Payer: Self-pay | Admitting: Gastroenterology

## 2014-03-31 DIAGNOSIS — D126 Benign neoplasm of colon, unspecified: Secondary | ICD-10-CM

## 2014-03-31 HISTORY — PX: COLONOSCOPY: SHX174

## 2014-03-31 HISTORY — DX: Benign neoplasm of colon, unspecified: D12.6

## 2014-05-06 ENCOUNTER — Encounter
Admit: 2014-05-06 | Disposition: A | Discharge status: Home / Self Care | Payer: Self-pay | Attending: Specialist | Admitting: Specialist

## 2014-05-22 NOTE — Discharge Summary (Signed)
PATIENT NAME:  Patricia Horne, Patricia Horne MR#:  [id number] DATE OF BIRTH:  [birth year]  DATE OF ADMISSION:  09/20/2012 DATE OF DISCHARGE:  09/22/2012  ADMITTING DIAGNOSIS: Right-sided flank pain and abdominal pain.   DISCHARGE DIAGNOSES: 1.  Right-sided flank and abdominal pain of unclear cause. Seen by surgery and gastroenterology, possible musculoskeletal in nature.  2.  Hypertension. 3.  Diabetes.   PERTINENT LABS AND EVALUATIONS: Glucose 250, BUN 12, creatinine 0.69, sodium 137, potassium 3.8, chloride 105, CO2 30. Lipase 90. LFTs were completely normal. Troponin less than 0.02. Sedimentation rate 13.   Urinalysis:  Nitrites negative, leukocytes negative.  CT of the abdomen and pelvis without contrast showed appendix not visualized. It is uncertain if the appendix has been removed. There is no evidence of acute infection or inflammatory process in the right pelvic or the right abdomen. No nephrolithiasis. Lumbosacral degenerative disk narrowing. Subhepatic cyst.   CONSULTANTS: Dr. Burt Knack and Dr. Gustavo Lah.   HOSPITAL COURSE: Please refer to H and P done by the admitting physician. The patient is a 64 year old African American female with history of diabetes came to the Emergency Room because of sudden onset of right flank pain. The patient also was complaining of some right lower abdominal discomfort as well. She was seen in the ED with these complaints. Had a CT scan of the abdomen which did not show any acute pathology. The patient was admitted for persistent symptoms. Her symptoms were atypical in nature. Her LFTs and the rest of her blood evaluation was negative. However, she continued to complain of these symptoms. Therefore, she was seen by GI and surgery. Surgery felt that there was no acute surgical issues going on. She was seen in consultation by GI who felt that this was non- GI related, recommended an outpatient colonoscopy. The patient still has some symptoms. I recommended outpatient follow-up.  She also had a lumbosacral x-ray that did not show any acute abnormality. At this time, she is stable for discharge.   DISCHARGE MEDICATIONS:  1.  Metformin 500 1 tab p.o. b.i.d. 2.  Lisinopril 10 daily. 3.  Aleve 220 one tab p.o. daily p.r.n. pain. 4.  Acetaminophen/oxycodone 325/5 four times a day as needed. 5.  Colace 2 tabs 2 times a day. 6.  Methocarbamol 500 two tabs 4 times a day as needed for muscle spasm. 7.  MiraLax 17 grams daily.   DIET: Low sodium, low fat, low cholesterol, regular consistency.   ACTIVITY: As tolerated.   DISCHARGE FOLLOWUP: With primary MD in 1 to 2 weeks. Follow up with Dr. Gustavo Lah in 10 weeks. At that time colonoscopy will be scheduled.  TIME SPENT ON DISCHARGE: 35 minutes. ____________________________ Patricia Mosses Posey Pronto, MD shp:sb D: 09/23/2012 08:15:26 ET T: 09/23/2012 09:07:19 ET JOB#: [id number]  cc: Shasha Buchbinder H. Posey Pronto, MD, <Dictator> Alric Seton MD ELECTRONICALLY SIGNED 09/24/2012 18:24

## 2014-05-22 NOTE — Consult Note (Signed)
PATIENT NAME:  Patricia Horne, CRAMMER MR#:  [id number] DATE OF BIRTH:  July 29, 1950  DATE OF CONSULTATION:  09/21/2012  REFERRING PHYSICIAN:   CONSULTING PHYSICIAN:  Jerrol Banana. Burt Knack, MD  CHIEF COMPLAINT:  Right flank pain.   HISTORY OF PRESENT ILLNESS:  This is a patient with acute onset of right flank pain.  She was sitting on the couch and reached to the floor for an object and felt the acute onset of right flank pain radiating from her back to her anterior right side.  She has never had an episode like this before.   However, of note, the patient retired approximately one month ago, experienced about one week of constipation and alternated with diarrhea.  She has had daily loose stools, none today.  In fact, has not had a bowel movement in two days.  She had a single emesis yesterday and is nauseated.  Denies fevers or chills.  No jaundice or acholic stools.    A work-up in the Emergency Room failed to identify a source for this pain.  There was negative ultrasound for gallstones as well as negative CT scan.   PAST MEDICAL HISTORY:  Hypertension, diabetes.   PAST SURGICAL HISTORY:  GYN surgery.   ALLERGIES:  None.   MEDICATIONS:  Multiple, see chart.   FAMILY HISTORY:  Noncontributory.   SOCIAL HISTORY:  The patient is recently retired.  Smokes tobacco.  Does not drink alcohol.   REVIEW OF SYSTEMS:  A 10 system review has been performed and negative with the exception of the items mentioned in the HPI.   PHYSICAL EXAMINATION: GENERAL:  Healthy-appearing black female patient.  VITAL SIGNS:  Afebrile.  Pulse of 70, respirations 18, blood pressure 125/78, 96% room air sat.  HEENT:  No scleral icterus.  NECK:  No palpable neck nodes.  CHEST:  Clear to auscultation.  CARDIAC:  Regular rate and rhythm.  ABDOMEN:  Soft and essentially nontender.  There is no CVA tenderness and the right side of the abdomen is essentially nontender as well.  EXTREMITIES:  Without edema.  NEUROLOGIC:  Grossly  intact.  INTEGUMENT:  No jaundice.   LABORATORY DATA:  Electrolytes are within normal limits with the exception of a glucose of 199.  White blood cell count of 9.4, H and H of 13 and 37 and a platelet count of 247.   As mentioned, ultrasound showed no stones.  CT was essentially normal.  Appendix could not be identified.   ASSESSMENT AND PLAN:  This is a patient with right flank pain, starts in her back, radiates anteriorly and is suggestive of somatic pain based on her history of having it occur acutely when she reached for an object on the floor.  She also had some nausea, vomiting and diarrhea, but that diarrhea is essentially gone.  She only had a single emesis, therefore the etiology of this is unclear.  A GI consult is currently pending.  I will observe the patient while she is in the hospital until etiology is discovered.     ____________________________ Jerrol Banana Burt Knack, MD rec:ea D: 09/21/2012 16:59:28 ET T: 09/21/2012 19:42:23 ET JOB#: [id number]  cc: Jerrol Banana. Burt Knack, MD, <Dictator> Florene Glen MD ELECTRONICALLY SIGNED 09/22/2012 11:08

## 2014-05-22 NOTE — Consult Note (Signed)
Brief Consult Note: Diagnosis: right flank pain, diarrhea.   Patient was seen by consultant.   Consult note dictated.   Recommend further assessment or treatment.   Comments: Please see full GI consult [phone].  Patient admitted with acute onset right lateral flank pain.  Some change of bowel habits for about 3-3 weeks prior, associated with medication and dietary changes.  No bm since admission, no abdominal pain on exam, patient states she is feel better currently.  Recommend multifilm views of the lumbo-sacral spine re radicular pain (note ct spine findings), start [facility] probiotic as o/p, colonoscopy to be arranged in on o/p GI followup.  Electronic Signatures: Loistine Simas (MD)  (Signed 23-Aug-14 20:52)  Authored: Brief Consult Note   Last Updated: 23-Aug-14 20:52 by Loistine Simas (MD)

## 2014-05-22 NOTE — Consult Note (Signed)
PATIENT NAME:  Patricia Horne, Patricia Horne MR#:  [id number] DATE OF BIRTH:  11/01/1950  DATE OF CONSULTATION:  09/21/2012  CONSULTING PHYSICIAN:  Lollie Sails, MD  This is a patient of Dr. Vianne Bulls.   REASON FOR CONSULTATION: Right-sided abdominal pain.   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 She states that several weeks ago after retirement she took a trip to [place], where there she was eating differently than she usually does and started to experience some problems with constipation. She took some Ex-Lax and other medications including MiraLAX to help her go. Subsequently, she developed some problems with diarrhea. Generally, she has a bowel movement about twice a day. She has had some diarrhea that she connects with having restarted some metformin recently. When she came to the hospital, she was stating she had some problems with diarrhea. However, she has had no bowel movement since Thursday. She has had problems with some nausea, but no vomiting. No heartburn or dysphagia. No history of peptic ulcer disease. She did have a colonoscopy over 5 years ago that was apparently negative. She denies any black stools, blood in the stools or slimy stools. Her main complaint has been that of some right-sided abdominal pain. This began actually yesterday. She is quite specific in saying that it began very quickly, acute onset when she was bending over to pick something up when she was sitting on a couch. Indeed, she states that it was a very severe pain yesterday and some this morning, however, is feeling very much better now. She does not have any history of back pain that she is aware of. The pain was sharp and radiated from mid back around to the right flank and back and forth. There seem to be no alleviating or exacerbating factors and again, she is beginning to feel better this evening.   PAST MEDICAL HISTORY: Hypertension, type 2 diabetes.    ALLERGIES: There are no known drug allergies.   SOCIAL HISTORY: The patient does not use alcohol. She does not use any drugs. She smokes about 1/2 pack of cigarettes daily.   OUTPATIENT MEDICATIONS: Include acetaminophen/oxycodone 325/5 mg 4 times a day p.r.n., Aleve 220 mg once a day as needed for headache or pain, ibuprofen 800 mg 3 times a day for 5 days (these apparently were started 08/22),  lisinopril 10 mg once a day, metformin 500 mg 1 tablet twice a day, methocarbamol 500 mg 2 tablets 4 times a day for 5 days.   GASTROINTESTINAL FAMILY HISTORY: Negative for colorectal cancer, liver disease or ulcers. She states that her grandmother had a colostomy due to a rectal infection. She could not be more specific.   PHYSICAL EXAMINATION: VITAL SIGNS: Temperature is 98.5, pulse 70, respirations 18, blood pressure 125/78, pulse oximetry 96%.  GENERAL: She is a well-appearing 64 year old female in no acute distress.  HEENT: Normocephalic, atraumatic. Eyes are anicteric. Nose: Septum midline. No lesions. Oropharynx: No lesions.  NECK: Supple. No JVD.  HEART: Regular rate and rhythm.  LUNGS: Clear.  ABDOMEN: Soft, nontender, nondistended. Bowel sounds positive, normoactive.  BACK: Examination of the spine by palpation as well as percussion is negative for pain. It is of note that the patient places the pain in the mid flank area, very lateral abdomen on the right side. This also seems to radiate either toward or from the back along that line.   LABORATORY AND RADIOLOGICAL DATA: Include the following: On admission to  the hospital, she had a glucose of 250. Metabolic panel was otherwise normal. Lipase was 90. Hepatic profile was normal. Hemogram normal. Repeat laboratories this morning were also normal, with the exception of a glucose being 199. She had a urinalysis that showed trace ketones, glucose over 500. She had a CT scan of the abdomen and pelvis without contrast showing lumbosacral  degenerative disk narrowing; a subhepatic cystic area in the right lobe, possibly a simple cyst, that verified by followup ultrasound; no evidence of nephrolithiasis or hydronephrosis; appendix was not visualized. Again, an ultrasound was done showing no sonographic evidence of cholecystitis or cholelithiasis.   ASSESSMENT: Right flank pain. This was very sudden in onset and related with movement and a particular positioning. It is of note that her CT scan shows evidence of lumbosacral degenerative disk narrowing and indeed, this could be a facet-type reaction. In terms of her bowel habit changes, she has been off and on of metformin for a bit, that causing some change in terms of constipation as well as diarrhea. She has had no stool since her admission and stool cultures are still pending collection. She currently is feeling better.   RECOMMENDATIONS: 1.  Would consider 3-way lumbosacral films.  2.  Would start her on Hardin Negus' Colon Health Probiotic.  3.  It has been about 5 years since her last colonoscopy. It is time for her to undergo that again. That can be done as an outpatient in GI followup.   ____________________________ Lollie Sails, MD mus:jm D: 09/21/2012 20:48:05 ET T: 09/21/2012 21:06:39 ET JOB#: [id number]  cc: Lollie Sails, MD, <Dictator> Lollie Sails MD ELECTRONICALLY SIGNED 10/10/2012 20:22

## 2014-05-22 NOTE — Consult Note (Signed)
Brief Consult Note: Diagnosis: flank pain.   Patient was seen by consultant.   Consult note dictated.   Recommend further assessment or treatment.   Comments: rt flank pain, unclear etiol but consider somatic pain as strted while reaching for object. Await GI consult but no further GI symptoms.  Electronic Signatures: Florene Glen (MD)  (Signed 23-Aug-14 16:32)  Authored: Brief Consult Note   Last Updated: 23-Aug-14 16:32 by Florene Glen (MD)

## 2014-05-22 NOTE — H&P (Signed)
PATIENT NAME:  Patricia Horne, Patricia Horne MR#:  [id number] DATE OF BIRTH:  [birth year]  DATE OF ADMISSION:  09/20/2012  PRIMARY CARE PHYSICIAN:  Dr. Eliezer Lofts.  EMERGENCY ROOM PHYSICIAN:  Dr. Renard Hamper.   CHIEF COMPLAINT: Abdominal pain.   HISTORY OF PRESENT ILLNESS: A 64 year old female patient with history of diabetes, came to the Emergency Room because of sudden onset of right flank pain. The patient started to have the flank pain since morning earlier, radiated to the back. Pain was around 10 out of 10 in severity, associated with an episode of vomiting. 4 mg of morphine given in the Emergency Room, but the patient lives alone and still has nagging pain. We are asked to admit the patient. The patient is having diarrhea for about a week, and the patient unable to keep anything dow. Has no fever. Recently traveled to [place] in July, back last weekend. 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 She has diarrhea, mainly loose stools, and today she noticed abdominal pain, but according to her, she was not having any nausea and was able to eat, but the problem is that she has profuse diarrhea for about a week, as I mentioned.   PAST MEDICAL HISTORY:  Significant for hypertension and diabetes.   ALLERGIES: The patient has no known allergies.   SOCIAL HISTORY: Smokes 1/2 pack per day. No alcohol. No drugs.   MEDICATIONS:  She is on:  1.  Percocet 5/325 as needed.  2.  Aleve 220 mg p.o. daily.  3.  Ibuprofen 800 mg p.o. t.i.d.  4.  Lisinopril 10 mg p.o. daily.  5.  Metformin 500 mg p.o. b.i.d.  6.  Methocarbamol as needed.  7.  The patient actually got methocarbamol and ibuprofen because she was ready to be discharged, but because of her persistent pain, the ER doctor  has decided to admit her.    FAMILY HISTORY: No hypertension.  Diabetes in the mother.  PAST SURGICAL HISTORY: Significant for total abdominal hysterectomy.    REVIEW OF SYSTEMS:  CONSTITUTIONAL: Denies any fever or fatigue.  EYES: No blurred vision.  ENT: No tinnitus. No epistaxis. No difficulty swallowing.  RESPIRATORY: No cough. No wheezing.  CARDIOVASCULAR: No chest pain. No orthopnea.  GASTROINTESTINAL: No nausea. No vomiting. Does have right flank pain.  GENITOURINARY: No dysuria.  ENDOCRINE: No polyuria or nocturia.  INTEGUMENTARY: No skin rashes.  MUSCULOSKELETAL: No joint pain.  NEUROLOGIC: No numbness or weakness.  PSYCHIATRIC: No anxiety or insomnia.   PHYSICAL EXAMINATION: VITAL SIGNS: Temperature is 98.6, heart rate 93, blood pressure 212/92, sats 96% on room air. GENERAL:  She is a well-developed and nourished female, not in distress.  HEENT: Head normocephalic, atraumatic. Pupils equal, reacting to light. Extraocular movements are intact. Eyes:  No scleral icterus. Nose: No turbinate hypertrophy. Ears: No tympanic membrane congestion. Mouth: No oral lesions. No oropharyngeal erythema.  NECK: Supple. No JVD. No carotid bruit.  RESPIRATORY: Clear to auscultation. No wheeze. No rales. ABDOMEN:  The patient has tenderness to deep palpation around right flank area. Bowel sounds are present. No hepatosplenomegaly.  MUSCULOSKELETAL: Normal gait and station.  EXTREMITIES: Move x 4.  SKIN: Has no skin rashes. Warm and dry.  NEUROLOGIC: Alert, awake, oriented. No cranial  nerve deficits.  PSYCHIATRIC: The patient's judgment and insight are adequate. Oriented to time, place and person.   RADIOLOGIC AND LABORATORY DATA: WBC 6.8, hemoglobin 14.8, hematocrit 42.2, platelets 269. ELECTROLYTES: Sodium 137, potassium 3.8, chloride 105, bicarb 30, BUN 12, creatinine 0.69, glucose 250.    Lipase 90. Troponin 0.02. Abdominal CAT scan:  Appendix is not visualized. No evidence of acute  infectious etiology.  No nephrolithiasis. No hydronephrosis. The patient has lower pole parabolic left renal cyst. right lower lobe liver cyst.  ABDOMINAL ULTRASOUND: No sonographic evidence of cholecystitis.   ASSESSMENT AND PLAN: A 64 year old female patient with nonspecific abdominal pain, likely secondary to gastritis, and viral gastroenteritis with diarrhea and episode of vomiting. The patient has no source of infection, either with CAT scan or ultrasound. We will admit her for observation. Continue IV fluids, IV pain medication, IV Zofran and PPIs. Check the stool for Clostridium difficile, and if she does well by tomorrow, she can be discharged home.  2.  Diabetes mellitus, type 2. Continue sliding scale with coverage, along with metformin.  3.  Hypertension. Did have accelerated hypertension when she came, likely secondary to abdominal pain. The patient is on lisinopril, continue that. Monitor and see how her blood pressure is. If needed, we can use p.r.n. hydralazine.  4.  Gastrointestinal prophylaxis and deep vein thrombosis prophylaxis with early ambulation.   TIME SPENT:  More than 55 minutes.    ____________________________ Epifanio Lesches, MD sk:dmm D: 09/20/2012 18:34:00 ET T: 09/20/2012 19:08:56 ET JOB#: [id number]  cc: Epifanio Lesches, MD, <Dictator> Epifanio Lesches MD ELECTRONICALLY SIGNED 10/27/2012 22:14

## 2014-05-22 NOTE — Consult Note (Signed)
Chief Complaint:  Subjective/Chief Complaint seen for abdominal pain, diarrhea.  no bm since admission, right flank pain nearly resolved.  no emesis, tolerating po.   VITAL SIGNS/ANCILLARY NOTES: **Vital Signs.:   24-Aug-14 09:19  Vital Signs Type Q 4hr  Celsius 36.8  Temperature Source oral  Pulse Pulse 57  Respirations Respirations 18  Systolic BP Systolic BP 941  Diastolic BP (mmHg) Diastolic BP (mmHg) 70  Mean BP 91  Pulse Ox % Pulse Ox % 95  Pulse Ox Activity Level  At rest  Oxygen Delivery Room Air/ 21 %   Brief Assessment:  Cardiac Regular   Respiratory clear BS   Gastrointestinal details normal Soft  Nontender  Nondistended  Bowel sounds normal  mild right flank discomfort to palpation   Radiology Results: XRay:    24-Aug-14 11:15, Lumbar Spine AP and Lateral  Lumbar Spine AP and Lateral   REASON FOR EXAM:    back pain  COMMENTS:       PROCEDURE: DXR - DXR LUMBAR SPINE AP AND LATERAL  - Sep 22 2012 11:15AM     RESULT: The lumbar vertebral bodies are preserved in height. The   intervertebral disc space heights are reasonably well maintained. There   is no evidence of a perched facet or spinous process fracture . The   pedicles and transverse processes appear intact. There is mild   degenerative change of the L4-L5 and L5-S1 facets.    IMPRESSION:  There is no acute bony abnormality of the lumbar spine.   There are mild degenerative facet joint change is noted at L4-L5 and   L5-S1.   Dictation Site: 5        Verified By: DAVID A. Martinique, M.D., MD  [place]:    22-Aug-14 13:57, US Abdomen Limited Survey  US Abdomen Limited Survey   REASON FOR EXAM:    RIGHT FLANK UQ PAIN  COMMENTS:   Body Site: GB and Fossa, CBD, Head of Pancreas; Right Upper   Quad; Panc    PROCEDURE: [place]  - US ABDOMEN LIMITED SURVEY  - Sep 20 2012  1:57PM     RESULT: Right or quadrant abdominal ultrasound dated     Technique: Real time sonographic imaging of the right quadrant was    obtained. Static representative images were provided for interpretation.    Comparison:    Findings: The liver demonstrates a cyst within the left lobe otherwise is   unremarkable. Hepatopedal flow is identified within the portal vein.  There is no evidence of pericholecystic fluid, gallstones, sludging,   gallbladder wall thickening, nor common bile duct dilation. The   gallbladder wall thickness is  1.7 mm and the common bile duct measures    5.1 mm in diameter. The technologist was unable to elicit a sonographic   Murphy's sign. The pancreas is unremarkable.    IMPRESSION:  No sonographic evidence of cholecystitis nor cholelithiasis.        Verified By: Mikki Santee, M.D., MD  CT:    22-Aug-14 12:26, CT Abdomen and Pelvis Without Contrast  CT Abdomen and Pelvis Without Contrast   REASON FOR EXAM:    (1) right flank pain; (2) right flank pain  COMMENTS:       PROCEDURE: CT  - CT ABDOMEN AND PELVIS W0  - Sep 20 2012 12:26PM     RESULT: Noncontrast CT of the abdomen and pelvis is reconstructed at 3 mm   slice thickness in the axial plane. The patient has  no previous exam for   comparison.    There is no evidence of nephrolithiasis or hydronephrosis. There appear   to be parapelvic cysts in the lower pole the left kidney. No radiopaque   gallstones are evident. There is a subdiaphragmatic cystic area laterally   in the posterior right lobe of the liver measuring 2.37 cm on image 33.   The spleen, pancreas, remaining portions of the liver, gallbladder,   adrenal glands and nondistended urinary bladder appear unremarkable. The     patient appears status post hysterectomy. Phleboliths are present in the   pelvic region. There is no abnormal colonic, gastric or small bowel   distention. No bowel wall thickening is evident. No ascites or abnormal   fluid collection is seen. No pleural or pericardial effusion is evident.   Lung bases are clear. Is degenerative disc narrowing  at L5-S1 with normal   spinal alignment and preservation of the vertebral body heights. No   enlarged lymph nodes are seen. The abdominal wall appears intact.    IMPRESSION:   1. The appendix is not visualized. It is uncertain if the appendix has   been removed. There is no evidence of acute infectious or inflammatory   process in the right pelvis or right abdomen.  2. No nephrolithiasis or hydronephrosis. Findings suggest lower pole   parapelvic left renal cystic change.  3. Lumbosacral degenerative disc narrowing.  4. Subhepatic cystic area in the right lobe of the liver possibly a   simple cyst. Followup of multiphasic exam versus ultrasound to document   this can be scheduled.    Dictation Site: 1        Verified By: Sundra Aland, M.D., MD   Assessment/Plan:  Assessment/Plan:  Assessment 1) right flank pain of uncertain etiology-possible radicular as it seemes to start after a particular movement, very abruptly.   2) diarrhea-not recurrent since admission.   Plan 1) recommend further evaluation of back pain/ortho.   2) daily miralax for recent constipation, and be d/c if too much.  3) consider skeletal muscle relaxants.  However if nsaids are used, would make sure she is on a PPI for gastritis prophylaxis.  4) OP gi fu to arrange for screening colonoscopy. Discussed with Dr Posey Pronto.   Electronic Signatures: Loistine Simas (MD)  (Signed 24-Aug-14 12:58)  Authored: Chief Complaint, VITAL SIGNS/ANCILLARY NOTES, Brief Assessment, Radiology Results, Assessment/Plan   Last Updated: 24-Aug-14 12:58 by Loistine Simas (MD)

## 2014-05-25 LAB — SURGICAL PATHOLOGY

## 2014-06-15 ENCOUNTER — Ambulatory Visit: Payer: BC Managed Care – PPO | Attending: Specialist | Admitting: Physical Therapy

## 2014-06-15 ENCOUNTER — Encounter: Payer: Self-pay | Admitting: Physical Therapy

## 2014-06-15 DIAGNOSIS — R293 Abnormal posture: Secondary | ICD-10-CM | POA: Diagnosis not present

## 2014-06-15 DIAGNOSIS — M7502 Adhesive capsulitis of left shoulder: Secondary | ICD-10-CM | POA: Insufficient documentation

## 2014-06-15 DIAGNOSIS — M25519 Pain in unspecified shoulder: Secondary | ICD-10-CM | POA: Insufficient documentation

## 2014-06-15 NOTE — Patient Instructions (Addendum)
All exercises provided were adapted from hep2go.com. Patient was provided a written handout with pictures as described. Any additional cues were manually entered in to handout and copied in to this document.   Prone T's w/ Trunk Lift (12 times, 3 second hold, 2 sets, 1 time per day)  Lie on your stomach (prone) with arms at your side at a "T" angle. Next, while keeping a chin tuck, lift both arms above your body, then lift your chest off the mat. Hold for 3-5 seconds, then lower the chest and arms slowly back to mat. Can be done with or without weights.     Thoracic Extension  (Perform 12 times, holding for 3 seconds, 3 times per day)  Sitting on a chair with the top hitting your mid back, place your hands crossed on your shoulders.  Slowly tilt back so you are stretching your mid back.  Hold for 20-30 seconds.  Release and repeat.    3 WAY SCAPULAR ROW - MIDDLE (10 times, 3 second hold, 2 sets, 1 time per day)  Tie a knot in the elastic band and close the knot in the door jam. Perform this exercise with the elastic band at elbow level.  Hold an elastic band with both arms in front of you. Next, pull the band back towards your side as you bend your elbows.     SCAPULAR RETRACTIONS (Perform 12 times, holding for 3 seconds, 3 times per day)  Draw your shoulder blades back and down.

## 2014-06-15 NOTE — Therapy (Signed)
[place] MAIN [facility] SERVICES [address] [place], [place], [address] Phone: [phone]   Fax:  [phone]  Physical Therapy Treatment  Patient Details  Name: Patricia Horne MRN: [id number] Date of Birth: Oct 13, 1950 Referring Provider:  Christophe Louis, MD  Encounter Date: 06/15/2014      PT End of Session - 06/15/14 1055    Visit Number 5   Number of Visits 12   Date for PT Re-Evaluation 06/23/14   PT Start Time 0950   PT Stop Time 6606   PT Time Calculation (min) 45 min   Activity Tolerance Patient tolerated treatment well;No increased pain   Behavior During Therapy [facility] for tasks assessed/performed;Flat affect      Past Medical History  Diagnosis Date  . Other and unspecified noninfectious gastroenteritis and colitis(558.9)   . Type II or unspecified type diabetes mellitus without mention of complication, not stated as uncontrolled   . Screening for lipoid disorders   . Special screening for osteoporosis   . Other screening mammogram   . Acute upper respiratory infections of unspecified site   . Tibialis tendinitis   . Abdominal pain, left lower quadrant   . Routine general medical examination at a health care facility   . Obesity   . Lipoma of unspecified site   . Unspecified essential hypertension   . Undiagnosed cardiac murmurs     Past Surgical History  Procedure Laterality Date  . Toe surgery  1990's    bunion, hammer toe repair  . Total abdominal hysterectomy  1983    one ovary removed, later other ovary removed    There were no vitals filed for this visit.  Visit Diagnosis:  Pain in shoulder region, unspecified laterality      Subjective Assessment - 06/15/14 0949    Subjective Patient reports that she works in the [employer] and sits at the desk most of the day, and has found benefit from using a back towel roll. She reports her right shoulder pain has improved, as has her left though her left is still  uncomfortable. Her chief complaint today is that over her incision over her left biceps region she feels as though something is moving that shouldn't be there with any active exercise.    Limitations Lifting   Patient Stated Goals To reduce her pain and stop the feeling over her left biceps region.    Currently in Pain? No/denies  Only with movement.         Manual therapy  Soft tissue mobilization provided to left biceps area, patient displayed allodynia and multiple tender spots with concurrent soft tissue restrictions. Patient did not tolerate well and discontinued.  P-A mobilizaitons grade I-II provided to thoracic and lower cervical regions, patient tolerated well and noted decreased pain with shoulder elevation afterwards.  Grade I-II mobilizations provided to the scapulae bilaterally for upward rotation and external rotation --> well tolerated     There-Ex  I's  In prone x 12 with cuing for adduction and retraction of scapulae bilaterally (lift your hands off the table) - patient felt appropriate activation of scapulae  T's in prone x 12, patient cued to adduct and retract scapulae bilaterally and felt muscular fatigue afterwards.  Mid Rows with red theraband with cuing for scapular retraction, rather than anterior tipping of the scapula. Patient felt activation of scapular retractors with a 3 second hold. X 12 repetitions.  Scapular retractions with cuing for squeezing shoulder blades together x 12  Thoracic extensions over chair with cuing for hands on opposite shoulders to reduce demand for shoulder elevation x 12 repetitions.  Patient asked to flex shoulder after each exercise for reproducible sign evaluation. After completion of the session, patient was able to flex her shoulder through functional range without pain over her left biceps.                           PT Education - 06/15/14 1102    Education provided Yes   Education Details Patient educated on  need to complete HEP as it was demonstrated to be helpful. Patient educated on compensatory utilization of biceps and upper trapezius as shoulder flexors, and likely contributors to her symptoms.    Person(s) Educated Patient   Methods Explanation;Demonstration;Tactile cues;Verbal cues;Handout   Comprehension Verbalized understanding;Returned demonstration          PT Short Term Goals - 06/15/14 1056    PT SHORT TERM GOAL #1   Title Patient to demonstrate AFE > 120-145 degrees with bilaterally for overhead reach 3/3 trials.    Status Achieved   PT SHORT TERM GOAL #2   Title Patient to report 0-1/10, 2-3/10 with PROM in 4 weeks.    Status Achieved   PT SHORT TERM GOAL #3   Title Patient to demonstrate HEP for self-stretching and scapula stabilization strengthening in 3/3 trials.    Status Partially Met   PT SHORT TERM GOAL #4   Title Patient to demonstrate independence with HEP for scapula stabilization as well as G-H rotator cuff active motions below 90 degrees.    Status Partially Met           PT Long Term Goals - 06/15/14 1059    PT LONG TERM GOAL #1   Title Patient to demonstrate endurance for UE activity at greater than shoulder level for 90 seconds duration by 06/23/2014.    Status Partially Met   PT LONG TERM GOAL #2   Title Patient to report overall DASH improvement > 15 points by 06/23/2014.    Status On-going               Plan - 06/15/14 1104    Clinical Impression Statement Patient displays intermittent adherence for HEP, and presents with some fear of moving her LUE in particular. Patient displays poor motor control/activity of her lower traps bilaterally (L>R), causing her to compensate with her upper trapezius and biceps brachii as supplemental shoulder flexors, likely decreasing the subacromial space. Patient also displayed significant tightness/stiffness with P-A mobilizations of her thoracic spine, likely indicating she is not achieving necessary thoracic  extension for overhead mobility. After motor control and mobilizaiton techniques were provided patient was able to complete functional shoulder AROM without pain  Skilled PT services are indicated to address the above deficits.    Pt will benefit from skilled therapeutic intervention in order to improve on the following deficits Pain;Decreased strength;Impaired UE functional use;Decreased range of motion   Rehab Potential Good   Clinical Impairments Affecting Rehab Potential Adherence to HEP    PT Frequency 2x / week   PT Duration 6 weeks   PT Treatment/Interventions Therapeutic exercise;Passive range of motion;Manual techniques;Moist Heat;Electrical Stimulation;Neuromuscular re-education  Iontophoresis   PT Next Visit Plan Assess and progress HEP as tolerated, increasing resistance and neuromuscular training for lower trapezius (may want to try incline push ups, scaption, low and mid rows).    PT Home Exercise Plan Provided in patient instructions.  Consulted and Agree with Plan of Care Patient        Problem List Patient Active Problem List   Diagnosis Date Noted  . Onychomycosis of left great toe 03/26/2014  . Chronic abdominal pain 03/12/2014  . Constipation, chronic 02/10/2014  . Abdominal bloating 02/10/2014  . Noncompliance with medication regimen 07/31/2013  . ALLERGIC RHINITIS DUE TO POLLEN 06/09/2008  . DM (diabetes mellitus), type 2, uncontrolled 09/05/2007  . OBESITY 05/29/2006  . Essential hypertension, benign 05/29/2006   Kerman Passey, PT, DPT   06/15/2014, 11:11 AM  [place] MAIN [facility] SERVICES [address] [place], [place], [address] Phone: [phone]   Fax:  [phone]

## 2014-06-26 ENCOUNTER — Encounter: Payer: Self-pay | Admitting: Family Medicine

## 2014-06-26 ENCOUNTER — Telehealth: Payer: Self-pay | Admitting: Family Medicine

## 2014-06-26 ENCOUNTER — Ambulatory Visit (INDEPENDENT_AMBULATORY_CARE_PROVIDER_SITE_OTHER): Payer: BC Managed Care – PPO | Admitting: Family Medicine

## 2014-06-26 VITALS — BP 160/100 | HR 90 | Temp 98.2°F | Ht 61.0 in | Wt 168.2 lb

## 2014-06-26 DIAGNOSIS — E1165 Type 2 diabetes mellitus with hyperglycemia: Secondary | ICD-10-CM

## 2014-06-26 DIAGNOSIS — IMO0002 Reserved for concepts with insufficient information to code with codable children: Secondary | ICD-10-CM

## 2014-06-26 DIAGNOSIS — R079 Chest pain, unspecified: Secondary | ICD-10-CM | POA: Diagnosis not present

## 2014-06-26 DIAGNOSIS — Z9114 Patient's other noncompliance with medication regimen: Secondary | ICD-10-CM | POA: Diagnosis not present

## 2014-06-26 DIAGNOSIS — I10 Essential (primary) hypertension: Secondary | ICD-10-CM

## 2014-06-26 DIAGNOSIS — R109 Unspecified abdominal pain: Secondary | ICD-10-CM

## 2014-06-26 LAB — HM DIABETES FOOT EXAM

## 2014-06-26 LAB — POCT URINALYSIS DIPSTICK
Bilirubin, UA: NEGATIVE
KETONES UA: NEGATIVE
LEUKOCYTES UA: NEGATIVE
Nitrite, UA: NEGATIVE
Protein, UA: NEGATIVE
RBC UA: NEGATIVE
Spec Grav, UA: >=1.03
UROBILINOGEN UA: 0.2
pH, UA: 6

## 2014-06-26 LAB — HEMOGLOBIN A1C: Hgb A1c MFr Bld: 11.9 % — ABNORMAL HIGH (ref 4.6–6.5)

## 2014-06-26 NOTE — Telephone Encounter (Signed)
LV for patient about scheduled ETT at [facility].  Patient is scheduled for first available on July 7th at 3:00 pm.  Patient also added to wait list.

## 2014-06-26 NOTE — Assessment & Plan Note (Addendum)
Very poorly controlled  as pt refuses to take medication. Encouraged exercise, weight loss, healthy eating habits.

## 2014-06-26 NOTE — Assessment & Plan Note (Signed)
Poor control off med. Encouraged pt to restart.

## 2014-06-26 NOTE — Assessment & Plan Note (Signed)
Eval with UA and urine culture.

## 2014-06-26 NOTE — Assessment & Plan Note (Signed)
Pt high risk for CAD given poorly controlled DM.  EKG stable,  Will refer for exercise tolerance test to rule out cardiac source. MAy be related to GERD instead.

## 2014-06-26 NOTE — Progress Notes (Signed)
Subjective:    Patient ID: Patricia Horne, female    DOB: 1951/01/02, 64 y.o.   MRN: [id number]  HPI  64 year old female  non compliant patient with poorly controlled diabetes presents for 3 month follow up.    Diabetes:   Improved minimally  at last OV, far from goal  as she is very non-compliant not taking levemir, glucotrol.   She had seen ENDO..in  Past  But was noncompliant there as well.  Se to metformin in past and refuses to take other med for no clear reason. Just does not fill med. Lab Results  Component Value Date   HGBA1C 11.7* 03/12/2014   Using medications without difficulties: Hypoglycemic episodes: Hyperglycemic episodes: Feet problems: None Blood Sugars averaging:nont checking. eye exam within last year: none   Wt Readings from Last 3 Encounters:  06/26/14 168 lb 4 oz (76.318 kg)  03/26/14 169 lb (76.658 kg)  03/12/14 169 lb 12 oz (76.998 kg)      Hypertension:   Poor control  Off amlodipine 10 mg daily. BP Readings from Last 3 Encounters:  06/26/14 160/100  03/26/14 163/76  03/12/14 170/100   Using medication without problems or lightheadedness:  None Chest pain with exertion: Episode after a meal ( glucerna), at rest.  IMporoved after 15-20 min. No exertional pain. No Sour taste in throat. Edema:None Short of breath:None Average home BPs: 140/80 Other issues: She is starting to jump rope.     Review of Systems  Constitutional: Negative for fever and fatigue.  HENT: Negative for ear pain.   Eyes: Negative for pain.  Respiratory: Negative for chest tightness and shortness of breath.   Cardiovascular: Negative for chest pain, palpitations and leg swelling.  Gastrointestinal: Negative for abdominal pain.  Genitourinary: Negative for dysuria, urgency, frequency and hematuria.  Musculoskeletal:       Right CVA tenderness in last few dayts. She is concerned about urine infeciton.           Objective:   Physical Exam  Constitutional:  Vital signs are normal. She appears well-developed and well-nourished. She is cooperative.  Non-toxic appearance. She does not appear ill. No distress.  obese  HENT:  Head: Normocephalic.  Right Ear: Hearing, tympanic membrane, external ear and ear canal normal. Tympanic membrane is not erythematous, not retracted and not bulging.  Left Ear: Hearing, tympanic membrane, external ear and ear canal normal. Tympanic membrane is not erythematous, not retracted and not bulging.  Nose: No mucosal edema or rhinorrhea. Right sinus exhibits no maxillary sinus tenderness and no frontal sinus tenderness. Left sinus exhibits no maxillary sinus tenderness and no frontal sinus tenderness.  Mouth/Throat: Uvula is midline, oropharynx is clear and moist and mucous membranes are normal.  Eyes: Conjunctivae, EOM and lids are normal. Pupils are equal, round, and reactive to light. Lids are everted and swept, no foreign bodies found.  Neck: Trachea normal and normal range of motion. Neck supple. Carotid bruit is not present. No thyroid mass and no thyromegaly present.  Cardiovascular: Normal rate, regular rhythm, S1 normal, S2 normal, normal heart sounds, intact distal pulses and normal pulses.  Exam reveals no gallop and no friction rub.   No murmur heard. Pulmonary/Chest: Effort normal and breath sounds normal. No tachypnea. No respiratory distress. She has no decreased breath sounds. She has no wheezes. She has no rhonchi. She has no rales.  Abdominal: Soft. Normal appearance and bowel sounds are normal. There is no tenderness. There is CVA tenderness.  Neurological: She is alert.  Skin: Skin is warm, dry and intact. No rash noted.  Psychiatric: Her speech is normal and behavior is normal. Judgment and thought content normal. Her mood appears not anxious. Cognition and memory are normal. She does not exhibit a depressed mood.         Diabetic foot exam: Normal inspection No skin breakdown No calluses  Normal  DP pulses Normal sensation to light touch and monofilament Nails normal  Assessment & Plan:

## 2014-06-26 NOTE — Patient Instructions (Addendum)
Stop at lab on way out. Please restart at least the amlodipine. Try in morning.  Checking blood sugar daily.  Set up yearly eye exam. Stop at front desk to set up stress test.

## 2014-06-26 NOTE — Progress Notes (Signed)
Pre visit review using our clinic review tool, if applicable. No additional management support is needed unless otherwise documented below in the visit note. 

## 2014-06-28 LAB — URINE CULTURE: Colony Count: 25000

## 2014-06-30 ENCOUNTER — Telehealth: Payer: Self-pay | Admitting: Family Medicine

## 2014-06-30 NOTE — Telephone Encounter (Signed)
Pt would like lab results, please call at home number, thanks.

## 2014-06-30 NOTE — Telephone Encounter (Signed)
[name] notified of lab results via telephone.  See results notes.

## 2014-07-01 IMAGING — CR DG LUMBAR SPINE 2-3V
1 series · 3 of 3 positions shown · non-contrast
Comparison: none

REASON FOR EXAM: back pain
COMMENTS:

[Series 1: ap · 0.17mm/px · 3 of 3 slices shown]
[im 1/3]
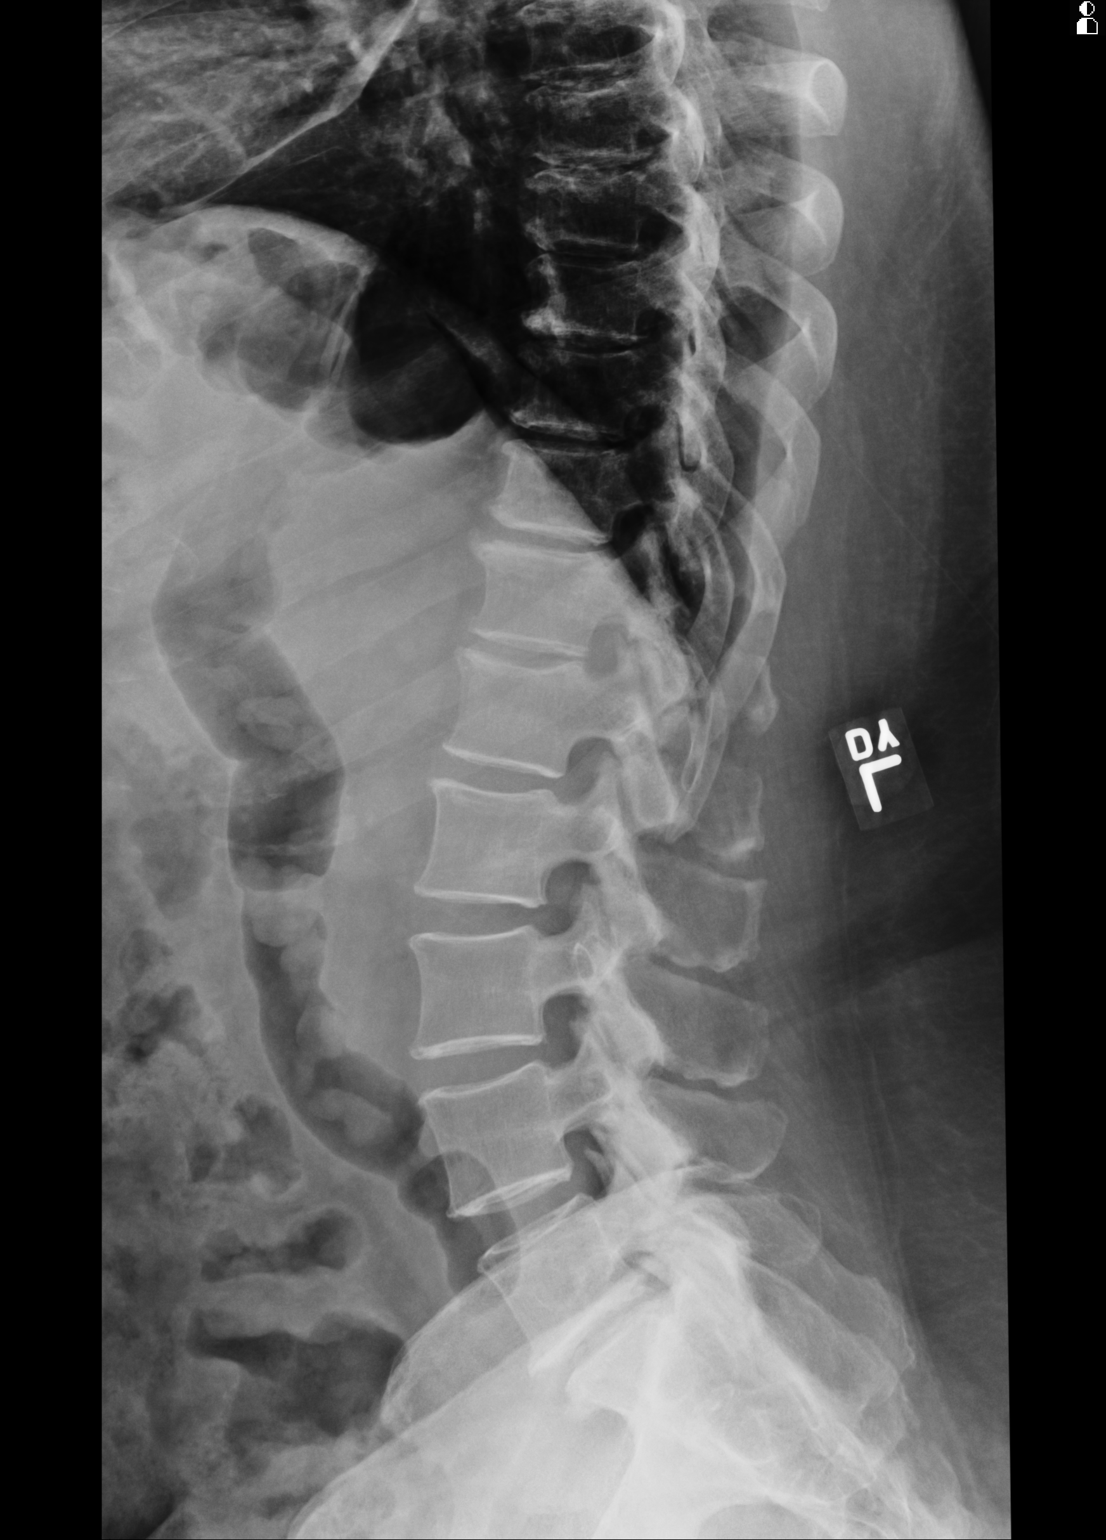
[im 2/3]
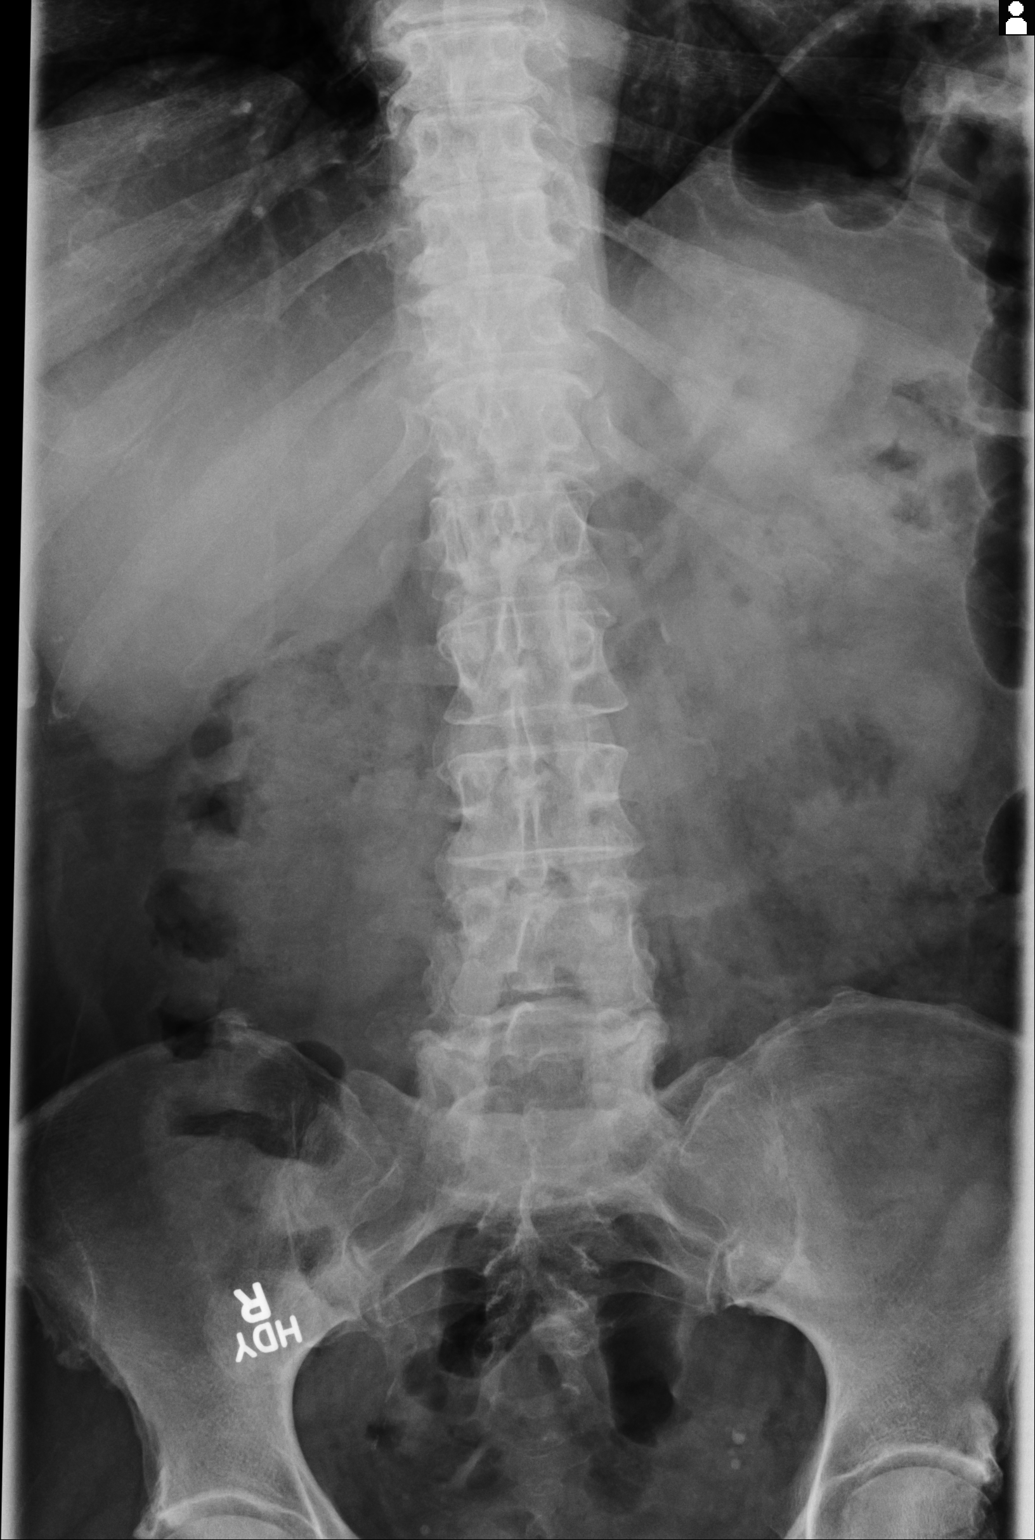
[im 3/3]
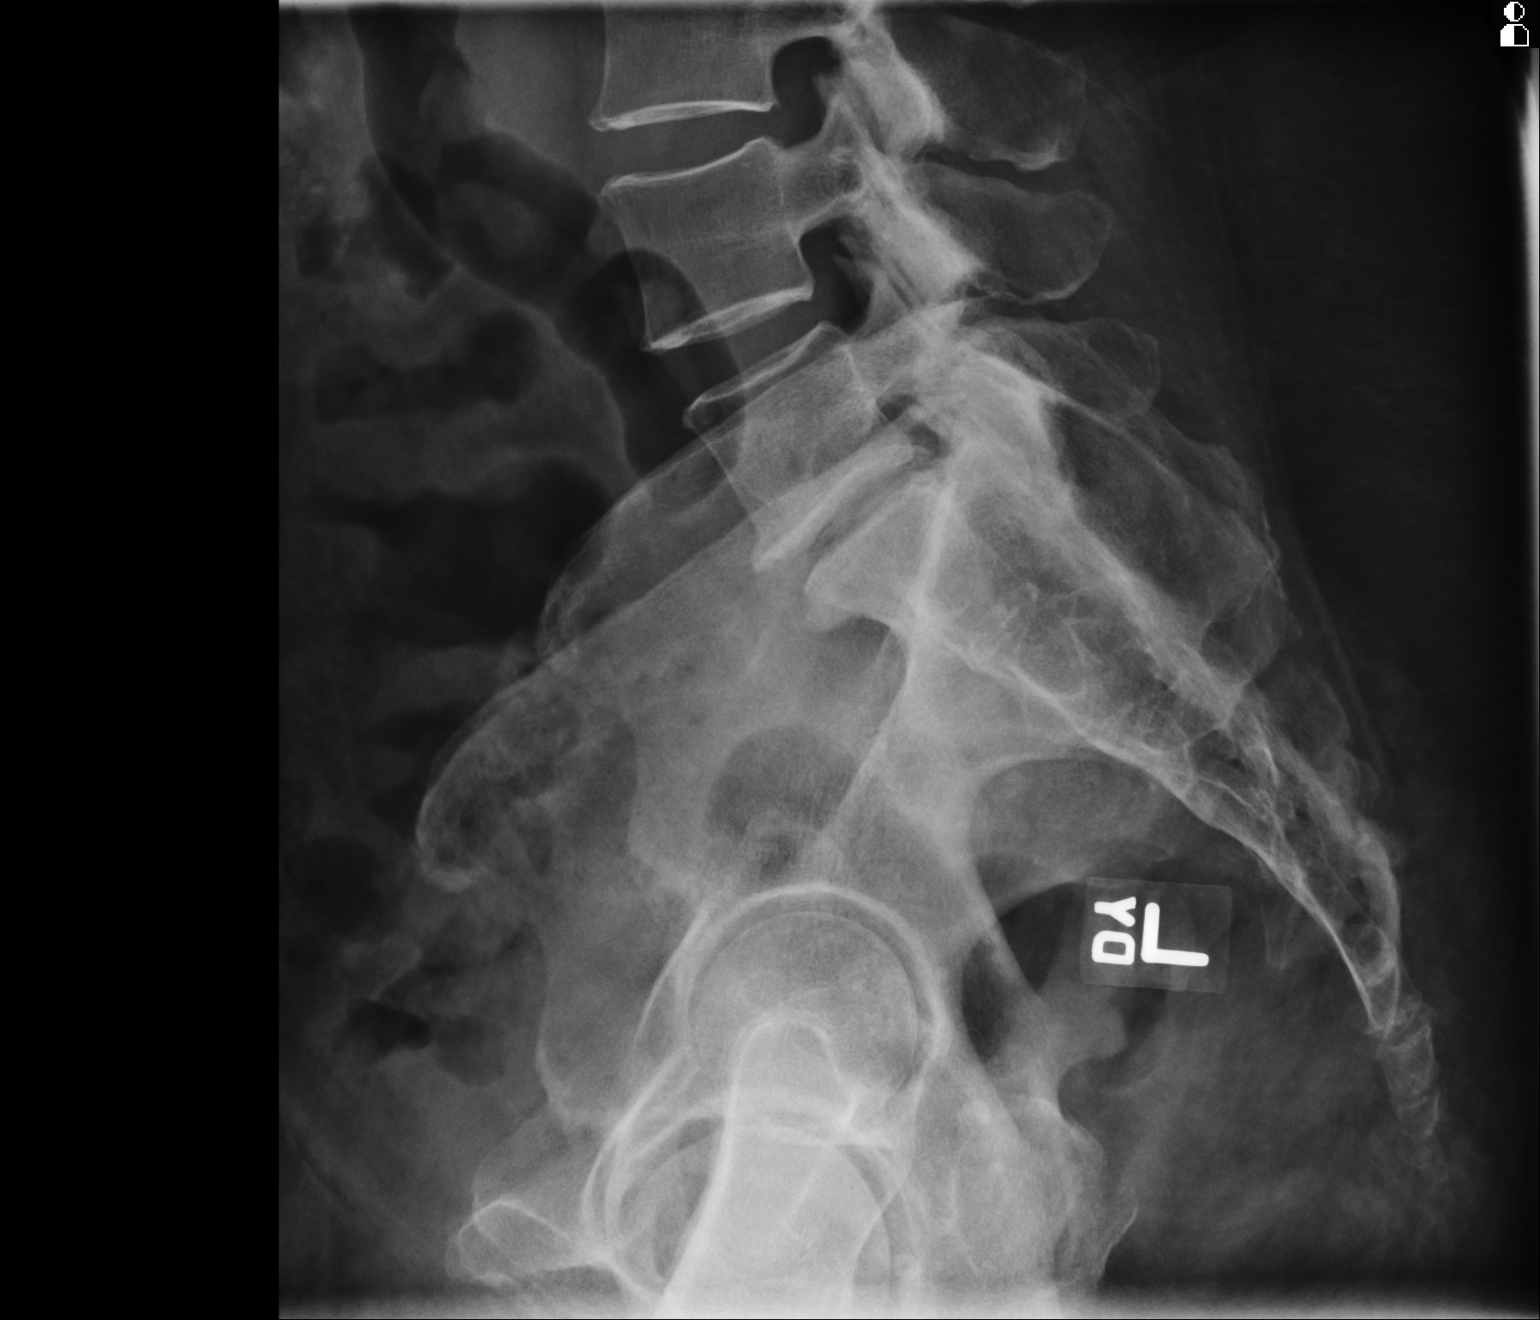

[3 of 3 positions shown; findings below may reference images not displayed]

PROCEDURE:     DXR - DXR LUMBAR SPINE AP AND LATERAL  - September 22, 2012 [DATE]

RESULT:     The lumbar vertebral bodies are preserved in height. The
intervertebral disc space heights are reasonably well maintained. There is
no evidence of a perched facet or spinous process fracture . The pedicles
and transverse processes appear intact. There is mild degenerative change of
the L4-L5 and L5-S1 facets.
IMPRESSION: There is no acute bony abnormality of the lumbar spine.
There are mild degenerative facet joint change is noted at L4-L5 and L5-S1.

[REDACTED]

## 2014-07-06 ENCOUNTER — Telehealth: Payer: Self-pay

## 2014-07-06 MED ORDER — FLUCONAZOLE 150 MG PO TABS: ORAL_TABLET | ORAL | Status: DC

## 2014-07-06 NOTE — Telephone Encounter (Signed)
Diflucan 150 mg, 1 po x 1 now, repeat if needed in 1 week. #2. 0 ref

## 2014-07-06 NOTE — Telephone Encounter (Signed)
Rx sent to pharmacy and pt notified  ?

## 2014-07-06 NOTE — Telephone Encounter (Signed)
Pt left v/m pt seen 06/26/14; A1C was 11.9 and now pt has yeast infection due to diabetes. Pt [facility] [address]. Pt request cb.

## 2014-07-08 ENCOUNTER — Telehealth: Payer: Self-pay | Admitting: Cardiovascular Disease

## 2014-07-08 NOTE — Telephone Encounter (Signed)
Please call patient with instructions for stress test ETT in July.  Patient is aware that this could be the day prior to the test.

## 2014-07-09 ENCOUNTER — Other Ambulatory Visit: Payer: Self-pay

## 2014-07-09 ENCOUNTER — Telehealth: Payer: Self-pay

## 2014-07-09 NOTE — Patient Instructions (Addendum)
Exercise Stress Echocardiogram, Care After These instructions give you information on caring for yourself after your procedure. Your doctor may also give you more specific instructions. Call your doctor if you have any problems or questions after your procedure.  HOME CARE   You may return to your normal diet, activities, and medicines as told by your doctor.  Follow up with your doctor as told. GET HELP IF:   You keep feeling dizzy or light-headed.  You feel like your heart is beating fast.  You keep feeling sick to your stomach or throw up. GET HELP RIGHT AWAY IF:   You have pain or pressure in any of these areas:  Your chest.  Your jaw or neck.  Between your shoulder blades.  Throbbing down your left arm.  You pass out.  You have trouble breathing. Document Released: 11/06/2012 Document Revised: 01/21/2013 Document Reviewed: 11/06/2012 [facility] Patient Information 2015 [place], [place]. This information is not intended to replace advice given to you by your health care provider. Make sure you discuss any questions you have with your health care provider. Exercise Stress Electrocardiogram An exercise stress electrocardiogram is a test to check how blood flows to your heart. It is done to find areas of poor blood flow. You will need to walk on a treadmill for this test. The electrocardiogram will record your heartbeat when you are at rest and when you are exercising. BEFORE THE PROCEDURE  Do not have drinks with caffeine or foods with caffeine for 24 hours before the test, or as told by your doctor. This includes coffee, tea (even decaf tea), sodas, chocolate, and cocoa.  Follow your doctor's instructions about eating and drinking before the test.  Ask your doctor what medicines you should or should not take before the test. Take your medicines with water unless told by your doctor not to.  If you use an inhaler, bring it with you to the test.  Bring a snack to eat after  the test.  Do not  smoke for 4 hours before the test.  Do not put lotions, powders, creams, or oils on your chest before the test.  Wear comfortable shoes and clothing. PROCEDURE  You will have patches put on your chest. Small areas of your chest may need to be shaved. Wires will be connected to the patches.  Your heart rate will be watched while you are resting and while you are exercising.  You will walk on the treadmill. The treadmill will slowly get faster to raise your heart rate.  The test will take about 1-2 hours. AFTER THE PROCEDURE  Your heart rate and blood pressure will be watched after the test.  You may return to your normal diet, activities, and medicines or as told by your doctor. Document Released: 07/05/2007 Document Revised: 06/02/2013 Document Reviewed: 09/23/2012 [facility] Patient Information 2015 [place], [place]. This information is not intended to replace advice given to you by your health care provider. Make sure you discuss any questions you have with your health care provider.

## 2014-07-09 NOTE — Telephone Encounter (Signed)
Patient returned call.  Notified her that we are putting treadmill instructions in the mail. Patient verbalized understanding with no further questions.

## 2014-07-09 NOTE — Telephone Encounter (Signed)
Left message with patient that treadmill instructions were being mailed out to her today.

## 2014-08-06 ENCOUNTER — Ambulatory Visit (INDEPENDENT_AMBULATORY_CARE_PROVIDER_SITE_OTHER): Payer: BC Managed Care – PPO | Admitting: Cardiovascular Disease

## 2014-08-06 DIAGNOSIS — R079 Chest pain, unspecified: Secondary | ICD-10-CM | POA: Diagnosis not present

## 2014-08-06 LAB — EXERCISE TOLERANCE TEST
Exercise duration (min): 4 min
Exercise duration (sec): 26 s

## 2014-08-11 ENCOUNTER — Other Ambulatory Visit: Payer: Self-pay | Admitting: *Deleted

## 2014-08-11 MED ORDER — GLIPIZIDE 10 MG PO TABS: 10.0000 mg | ORAL_TABLET | Freq: Two times a day (BID) | ORAL | Status: DC

## 2014-08-11 MED ORDER — AMLODIPINE BESYLATE 10 MG PO TABS: 10.0000 mg | ORAL_TABLET | Freq: Every day | ORAL | Status: DC

## 2014-08-18 ENCOUNTER — Encounter: Payer: Self-pay | Admitting: Family Medicine

## 2014-08-18 ENCOUNTER — Ambulatory Visit (INDEPENDENT_AMBULATORY_CARE_PROVIDER_SITE_OTHER): Payer: BC Managed Care – PPO | Admitting: Family Medicine

## 2014-08-18 VITALS — BP 130/72 | HR 93 | Temp 98.2°F | Ht 61.0 in | Wt 171.0 lb

## 2014-08-18 DIAGNOSIS — E1165 Type 2 diabetes mellitus with hyperglycemia: Secondary | ICD-10-CM | POA: Diagnosis not present

## 2014-08-18 DIAGNOSIS — N898 Other specified noninflammatory disorders of vagina: Secondary | ICD-10-CM

## 2014-08-18 DIAGNOSIS — IMO0002 Reserved for concepts with insufficient information to code with codable children: Secondary | ICD-10-CM

## 2014-08-18 LAB — POCT WET PREP (WET MOUNT)
KOH Wet Prep POC: NEGATIVE
Trichomonas Wet Prep HPF POC: NEGATIVE

## 2014-08-18 NOTE — Patient Instructions (Addendum)
Increase 50 Units of Levemir daily to improve.  We will call with labs results.

## 2014-08-18 NOTE — Assessment & Plan Note (Signed)
No longer followed by ENDO. Increase levemir to 50 units daily. Encouraged exercise, weight loss, healthy eating habits.

## 2014-08-18 NOTE — Assessment & Plan Note (Signed)
Eval with wet prep and GC/Chlam given new sexual contact.

## 2014-08-18 NOTE — Progress Notes (Signed)
Pre visit review using our clinic review tool, if applicable. No additional management support is needed unless otherwise documented below in the visit note. 

## 2014-08-18 NOTE — Progress Notes (Signed)
   Subjective:    Patient ID: Patricia Horne, female    DOB: October 13, 1950, 64 y.o.   MRN: [id number]  HPI  64 year old female with poorly controlled DM presents  With new onset vaginal discharge.  She was treated over the phone on 07/06/2014 for candida vaginitis.  Too one tab on diflucan.Marland Kitchen Helped ithcing  After that improved. Now with discharge clear, no odor, no ithing started in last  few weeks.  Temporaryain after sex in mid abdomen, last few hoursderately constipated.  She does have new sex partner in last 6 months. Nospecific concern of STD exposure.  She had previously not been taking any of her medication for DM.  She is back on levemir 40 Units daily. CBGs  Still in 200s. Review of Systems  Constitutional: Positive for fatigue. Negative for fever.  HENT: Negative for ear pain.   Eyes: Negative for pain.  Respiratory: Negative for cough.   Cardiovascular: Negative for leg swelling.  Gastrointestinal: Positive for abdominal pain.       Objective:   Physical Exam  Constitutional: Vital signs are normal. She appears well-developed and well-nourished. She is cooperative.  Non-toxic appearance. She does not appear ill. No distress.  HENT:  Head: Normocephalic.  Right Ear: Hearing, tympanic membrane, external ear and ear canal normal. Tympanic membrane is not erythematous, not retracted and not bulging.  Left Ear: Hearing, tympanic membrane, external ear and ear canal normal. Tympanic membrane is not erythematous, not retracted and not bulging.  Nose: No mucosal edema or rhinorrhea. Right sinus exhibits no maxillary sinus tenderness and no frontal sinus tenderness. Left sinus exhibits no maxillary sinus tenderness and no frontal sinus tenderness.  Mouth/Throat: Uvula is midline, oropharynx is clear and moist and mucous membranes are normal.  Eyes: Conjunctivae, EOM and lids are normal. Pupils are equal, round, and reactive to light. Lids are everted and swept, no foreign bodies  found.  Neck: Trachea normal and normal range of motion. Neck supple. Carotid bruit is not present. No thyroid mass and no thyromegaly present.  Cardiovascular: Normal rate, regular rhythm, S1 normal, S2 normal, normal heart sounds, intact distal pulses and normal pulses.  Exam reveals no gallop and no friction rub.   No murmur heard. Pulmonary/Chest: Effort normal and breath sounds normal. No tachypnea. No respiratory distress. She has no decreased breath sounds. She has no wheezes. She has no rhonchi. She has no rales.  Abdominal: Soft. Normal appearance and bowel sounds are normal. There is no tenderness.  Genitourinary: Vagina normal. There is no rash, tenderness, lesion or injury on the right labia. There is no rash, tenderness, lesion or injury on the left labia. No erythema, tenderness or bleeding in the vagina. No foreign body around the vagina. No signs of injury around the vagina. No vaginal discharge found.  Neurological: She is alert.  Skin: Skin is warm, dry and intact. No rash noted.  Psychiatric: Her speech is normal and behavior is normal. Judgment and thought content normal. Her mood appears not anxious. Cognition and memory are normal. She does not exhibit a depressed mood.          Assessment & Plan:

## 2014-08-19 LAB — GC/CHLAMYDIA PROBE AMP
CT Probe RNA: NEGATIVE
GC Probe RNA: NEGATIVE

## 2014-09-24 ENCOUNTER — Telehealth: Payer: Self-pay | Admitting: Family Medicine

## 2014-09-24 ENCOUNTER — Other Ambulatory Visit (INDEPENDENT_AMBULATORY_CARE_PROVIDER_SITE_OTHER): Payer: BC Managed Care – PPO

## 2014-09-24 DIAGNOSIS — IMO0002 Reserved for concepts with insufficient information to code with codable children: Secondary | ICD-10-CM

## 2014-09-24 DIAGNOSIS — E1165 Type 2 diabetes mellitus with hyperglycemia: Secondary | ICD-10-CM | POA: Diagnosis not present

## 2014-09-24 LAB — COMPREHENSIVE METABOLIC PANEL
ALBUMIN: 3.7 g/dL (ref 3.5–5.2)
ALK PHOS: 78 U/L (ref 39–117)
ALT: 12 U/L (ref 0–35)
AST: 13 U/L (ref 0–37)
BILIRUBIN TOTAL: 0.8 mg/dL (ref 0.2–1.2)
BUN: 13 mg/dL (ref 6–23)
CO2: 31 mEq/L (ref 19–32)
CREATININE: 0.63 mg/dL (ref 0.40–1.20)
Calcium: 9.2 mg/dL (ref 8.4–10.5)
Chloride: 103 mEq/L (ref 96–112)
GFR: 122.32 mL/min (ref >60.00)
GLUCOSE: 263 mg/dL — AB (ref 70–99)
Potassium: 4.4 mEq/L (ref 3.5–5.1)
SODIUM: 138 meq/L (ref 135–145)
TOTAL PROTEIN: 7.1 g/dL (ref 6.0–8.3)

## 2014-09-24 LAB — LIPID PANEL
CHOL/HDL RATIO: 3
CHOLESTEROL: 141 mg/dL (ref 0–200)
HDL: 44.4 mg/dL (ref >39.00)
LDL Cholesterol: 73 mg/dL (ref 0–99)
NonHDL: 96.78
TRIGLYCERIDES: 118 mg/dL (ref 0.0–149.0)
VLDL: 23.6 mg/dL (ref 0.0–40.0)

## 2014-09-24 LAB — HEMOGLOBIN A1C: HEMOGLOBIN A1C: 11.1 % — AB (ref 4.6–6.5)

## 2014-09-24 NOTE — Telephone Encounter (Signed)
-----   Message from Ellamae Sia sent at 09/16/2014 12:05 PM EDT ----- Regarding: Lab orders for Thursdday, 8.25.16 Lab orders for a 3 month follow up appt.  A1C needed per appt comment

## 2014-09-29 ENCOUNTER — Encounter: Payer: Self-pay | Admitting: Family Medicine

## 2014-09-29 ENCOUNTER — Ambulatory Visit (INDEPENDENT_AMBULATORY_CARE_PROVIDER_SITE_OTHER): Payer: BC Managed Care – PPO | Admitting: Family Medicine

## 2014-09-29 VITALS — BP 158/80 | HR 93 | Temp 98.2°F | Ht 61.0 in | Wt 171.8 lb

## 2014-09-29 DIAGNOSIS — I10 Essential (primary) hypertension: Secondary | ICD-10-CM

## 2014-09-29 DIAGNOSIS — E1365 Other specified diabetes mellitus with hyperglycemia: Principal | ICD-10-CM

## 2014-09-29 DIAGNOSIS — G63 Polyneuropathy in diseases classified elsewhere: Secondary | ICD-10-CM

## 2014-09-29 DIAGNOSIS — K5909 Other constipation: Secondary | ICD-10-CM

## 2014-09-29 DIAGNOSIS — E1349 Other specified diabetes mellitus with other diabetic neurological complication: Secondary | ICD-10-CM

## 2014-09-29 DIAGNOSIS — Z6832 Body mass index (BMI) 32.0-32.9, adult: Secondary | ICD-10-CM

## 2014-09-29 DIAGNOSIS — IMO0002 Reserved for concepts with insufficient information to code with codable children: Secondary | ICD-10-CM

## 2014-09-29 DIAGNOSIS — K5900 Constipation, unspecified: Secondary | ICD-10-CM

## 2014-09-29 DIAGNOSIS — E1140 Type 2 diabetes mellitus with diabetic neuropathy, unspecified: Secondary | ICD-10-CM | POA: Insufficient documentation

## 2014-09-29 DIAGNOSIS — Z9114 Patient's other noncompliance with medication regimen: Secondary | ICD-10-CM | POA: Diagnosis not present

## 2014-09-29 NOTE — Assessment & Plan Note (Signed)
Per pt better control at home, but still not at goal on amlodipine. Pt refuses another med. Encouraged exercise, weight loss, healthy eating habits.

## 2014-09-29 NOTE — Progress Notes (Signed)
Subjective:    Patient ID: Patricia Horne, female    DOB: 1950/04/19, 64 y.o.   MRN: [id number]  HPI 64 year old female presents for 3 month follow up DM and chol.   Diabetes:   Poor control, noncompliant with insulin, she is taking the glipizide.. Was supposed to increase to 50 Units daily. She did this for a week.. CBGs still high at 200 on this med.  Lab Results  Component Value Date   HGBA1C 11.1* 09/24/2014  Using medications without difficulties:None Hypoglycemic episodes:none Hyperglycemic episodes:YES Feet problems:none Blood Sugars averaging: 200s eye exam within last year:  Some burning in feet at night  constipation.  Cholesterol:  LDL at goal on no med. Lab Results  Component Value Date   CHOL 141 09/24/2014   HDL 44.40 09/24/2014   LDLCALC 73 09/24/2014   TRIG 118.0 09/24/2014   CHOLHDL 3 09/24/2014    Diet compliance: Moderate Exercise: None Other complaints:  Hypertension:    Inadequate control on amlodipine 10 mg daily  BP Readings from Last 3 Encounters:  09/29/14 158/80  08/18/14 130/72  06/26/14 160/100  Using medication without problems or lightheadedness:  Chest pain with exertion: Edema: Short of breath: Average home BPs: At home she reports 140/86 Other issues:   Wt Readings from Last 3 Encounters:  09/29/14 171 lb 12 oz (77.905 kg)  08/18/14 171 lb (77.565 kg)  06/26/14 168 lb 4 oz (76.318 kg)   Body mass index is 32.47 kg/(m^2).      Review of Systems  Constitutional: Negative for fever and fatigue.  HENT: Negative for ear pain.   Respiratory: Negative for cough and shortness of breath.   Gastrointestinal: Positive for abdominal pain and constipation.  Skin:       Odor no rash under pannus at times, sweaty.       Objective:   Physical Exam  Constitutional: Vital signs are normal. She appears well-developed and well-nourished. She is cooperative.  Non-toxic appearance. She does not appear ill. No distress.  HENT:    Head: Normocephalic.  Right Ear: Hearing, tympanic membrane, external ear and ear canal normal. Tympanic membrane is not erythematous, not retracted and not bulging.  Left Ear: Hearing, tympanic membrane, external ear and ear canal normal. Tympanic membrane is not erythematous, not retracted and not bulging.  Nose: No mucosal edema or rhinorrhea. Right sinus exhibits no maxillary sinus tenderness and no frontal sinus tenderness. Left sinus exhibits no maxillary sinus tenderness and no frontal sinus tenderness.  Mouth/Throat: Uvula is midline, oropharynx is clear and moist and mucous membranes are normal.  Eyes: Conjunctivae, EOM and lids are normal. Pupils are equal, round, and reactive to light. Lids are everted and swept, no foreign bodies found.  Neck: Trachea normal and normal range of motion. Neck supple. Carotid bruit is not present. No thyroid mass and no thyromegaly present.  Cardiovascular: Normal rate, regular rhythm, S1 normal, S2 normal, normal heart sounds, intact distal pulses and normal pulses.  Exam reveals no gallop and no friction rub.   No murmur heard. Pulmonary/Chest: Effort normal and breath sounds normal. No tachypnea. No respiratory distress. She has no decreased breath sounds. She has no wheezes. She has no rhonchi. She has no rales.  Abdominal: Soft. Normal appearance and bowel sounds are normal. There is no tenderness.  Neurological: She is alert.  Skin: Skin is warm, dry and intact. No rash noted.  Psychiatric: Her speech is normal and behavior is normal. Judgment and thought content  normal. Her mood appears not anxious. Cognition and memory are normal. She does not exhibit a depressed mood.    Diabetic foot exam: Normal inspection No skin breakdown No calluses  Normal DP pulses Normal sensation to light touch and monofilament, per pt burning in feet. Nails normal       Assessment & Plan:

## 2014-09-29 NOTE — Assessment & Plan Note (Signed)
Likely due to diabetes. Increase exercise, lower CBS.  Can sue miralax or dulcolax prn.

## 2014-09-29 NOTE — Assessment & Plan Note (Signed)
Poor control off levemir, pt has not reason she stopped taking.. Just doesn't liek med. Not at goal < 120 FBS on Levemir 50 units.  Increase to 55 units levemeir, close follow up in 4 weeks to help with compliance.

## 2014-09-29 NOTE — Assessment & Plan Note (Signed)
Burning in feet due to DM. Encouraged better control of glucose.

## 2014-09-29 NOTE — Patient Instructions (Addendum)
Restart levemir 55 units daily.  Bring in measurement of fasting blood sugars. Follow BP at home and bring in measurements. Decrease juice,  carbs and sweets in diet. Start walking daily or jumping rope.

## 2014-09-29 NOTE — Progress Notes (Signed)
Pre visit review using our clinic review tool, if applicable. No additional management support is needed unless otherwise documented below in the visit note. 

## 2014-10-06 ENCOUNTER — Telehealth: Payer: Self-pay

## 2014-10-06 NOTE — Telephone Encounter (Signed)
Sugar sounds great at 128 fasing with the metformin and Levemir. Have her continue this. I would not use anything for BM as metformin will likely cause BM again soon.  She can use ibuprofen 800 mg every eight hours as neded for pain in hip, if not improving make appt to be seen.

## 2014-10-06 NOTE — Telephone Encounter (Signed)
Pt was last seen 09/29/14; on 10/02/14 pt stopped the glipizide on her own and started metformin 1000 mg in AM and pt continues taking Levemir 50 U in AM. 10/06/14 FBS was 128. Pt has had stomach issues for awhile but 10/03/14 pt had lifted a bottle of heavy water and pt had pain in lt hip and pain radiated down lt leg. Pain goes away in morning and has pain return when goes to bed at night. Pt's last normal BM was one week ago; pt had diarrhea stools on 10/03/14 and no BM since 10/03/14. No fever, nausea and vomiting. Pt does not want to schedule appt but wants Dr Diona Browner to know what is going on.[facility].

## 2014-10-06 NOTE — Telephone Encounter (Signed)
Left message for Ms. Gottsch to return my call. 

## 2014-10-07 NOTE — Telephone Encounter (Signed)
Patricia Horne notified as instructed by telephone.  She states she is feeling a little better today.  She reports that her FBS this morning was 220 mg/dl.  She also reports that her blood pressure is still running high.  Yesterday she got 148/90 and today was 164/93.  She is scheduled to follow up with Dr. Diona Browner on 10/30/2014.

## 2014-10-12 ENCOUNTER — Telehealth: Payer: Self-pay | Admitting: *Deleted

## 2014-10-12 NOTE — Telephone Encounter (Signed)
Patricia Horne called wanting to know what other options besides Levemir that she can use.  She states her cost went up from $30 to $80 and she can not afford that.  Please advise.

## 2014-10-13 MED ORDER — INSULIN ASPART PROT & ASPART (70-30 MIX) 100 UNIT/ML PEN: 30.0000 [IU] | PEN_INJECTOR | Freq: Two times a day (BID) | SUBCUTANEOUS | Status: DC

## 2014-10-13 NOTE — Telephone Encounter (Signed)
Patricia Horne notified as instructed by telephone.

## 2014-10-13 NOTE — Telephone Encounter (Signed)
Patricia Horne called back to stay that she did have some Humalog pens 100 unit/per ml at home that was given to her so she is wanting to know if she can use that instead of the levemir.

## 2014-10-13 NOTE — Telephone Encounter (Signed)
We can have her switch to Novolog 70/30 but this wpould be inj in AM and dinner... Is that okay?

## 2014-10-13 NOTE — Telephone Encounter (Signed)
Patricia Horne states she is okay switching to the Novolog 70/30 with two times a daily injections.  [facility] [address].

## 2014-10-13 NOTE — Telephone Encounter (Signed)
Patricia Horne notified as instructed by telephone.  Yes, cost is the issue with the Levemir.  Anything else that she can use that would be cheaper?  Please advise.

## 2014-10-13 NOTE — Telephone Encounter (Signed)
Let pt know this med lasts 12 hours so she needs to take  TWO times a day to get same dose as previously. It is 70% intermediate length acting insulin and 30% fasting acting insulin.  She also needs to take with a meals because fast acting component in med can drop CBG low in 30 min after taking if she does not.  I sent in pen form, but if this is still too expensive.. We can try the syringe/ vial form.

## 2014-10-13 NOTE — Telephone Encounter (Signed)
No humalog is a diferent medication. Not long acting. What is issue.. ? Trouble affording.

## 2014-10-15 MED ORDER — INSULIN DETEMIR 100 UNIT/ML FLEXPEN: 55.0000 [IU] | PEN_INJECTOR | Freq: Every day | SUBCUTANEOUS | Status: DC

## 2014-10-15 NOTE — Addendum Note (Signed)
Addended by: Carter Kitten on: 10/15/2014 11:25 AM   Modules accepted: Orders

## 2014-10-15 NOTE — Telephone Encounter (Addendum)
Patricia Horne called to say when she went to pick up the Novolog 70/30 it was going to cost her the same as Levemir so [name] states she will just continue with the Levemir.  Refills with new dose sent to her pharmacy as requested.

## 2014-10-15 NOTE — Telephone Encounter (Signed)
Okay 

## 2014-10-25 ENCOUNTER — Emergency Department: Payer: BC Managed Care – PPO

## 2014-10-25 ENCOUNTER — Encounter: Payer: Self-pay | Admitting: Emergency Medicine

## 2014-10-25 ENCOUNTER — Emergency Department
Admission: EM | Admit: 2014-10-25 | Discharge: 2014-10-25 | Disposition: A | Discharge status: Home / Self Care | Payer: BC Managed Care – PPO | Attending: Emergency Medicine | Admitting: Emergency Medicine

## 2014-10-25 DIAGNOSIS — Z794 Long term (current) use of insulin: Secondary | ICD-10-CM | POA: Insufficient documentation

## 2014-10-25 DIAGNOSIS — S8001XA Contusion of right knee, initial encounter: Secondary | ICD-10-CM | POA: Diagnosis not present

## 2014-10-25 DIAGNOSIS — Z79899 Other long term (current) drug therapy: Secondary | ICD-10-CM | POA: Insufficient documentation

## 2014-10-25 DIAGNOSIS — Y9389 Activity, other specified: Secondary | ICD-10-CM | POA: Diagnosis not present

## 2014-10-25 DIAGNOSIS — Y92410 Unspecified street and highway as the place of occurrence of the external cause: Secondary | ICD-10-CM | POA: Diagnosis not present

## 2014-10-25 DIAGNOSIS — S20319A Abrasion of unspecified front wall of thorax, initial encounter: Secondary | ICD-10-CM | POA: Diagnosis not present

## 2014-10-25 DIAGNOSIS — Y998 Other external cause status: Secondary | ICD-10-CM | POA: Insufficient documentation

## 2014-10-25 DIAGNOSIS — S20219A Contusion of unspecified front wall of thorax, initial encounter: Secondary | ICD-10-CM

## 2014-10-25 DIAGNOSIS — E119 Type 2 diabetes mellitus without complications: Secondary | ICD-10-CM | POA: Diagnosis not present

## 2014-10-25 DIAGNOSIS — I10 Essential (primary) hypertension: Secondary | ICD-10-CM | POA: Diagnosis not present

## 2014-10-25 DIAGNOSIS — T07XXXA Unspecified multiple injuries, initial encounter: Secondary | ICD-10-CM

## 2014-10-25 DIAGNOSIS — S8002XA Contusion of left knee, initial encounter: Secondary | ICD-10-CM | POA: Diagnosis not present

## 2014-10-25 DIAGNOSIS — S299XXA Unspecified injury of thorax, initial encounter: Secondary | ICD-10-CM | POA: Diagnosis present

## 2014-10-25 MED ORDER — TIZANIDINE HCL 4 MG PO TABS: 4.0000 mg | ORAL_TABLET | Freq: Four times a day (QID) | ORAL | Status: DC | PRN

## 2014-10-25 MED ORDER — HYDROCODONE-ACETAMINOPHEN 5-325 MG PO TABS
1.0000 | ORAL_TABLET | Freq: Once | ORAL | Status: AC
  Administered 2014-10-25: 1 via ORAL
  Filled 2014-10-25: qty 1

## 2014-10-25 MED ORDER — HYDROCODONE-ACETAMINOPHEN 5-325 MG PO TABS: 1.0000 | ORAL_TABLET | ORAL | Status: DC | PRN

## 2014-10-25 NOTE — Discharge Instructions (Signed)
Blunt Chest Trauma °Blunt chest trauma is an injury caused by a blow to the chest. These chest injuries can be very painful. Blunt chest trauma often results in bruised or broken (fractured) ribs. Most cases of bruised and fractured ribs from blunt chest traumas get better after 1 to 3 weeks of rest and pain medicine. Often, the soft tissue in the chest wall is also injured, causing pain and bruising. Internal organs, such as the heart and lungs, may also be injured. Blunt chest trauma can lead to serious medical problems. This injury requires immediate medical care. °CAUSES  °· Motor vehicle collisions. °· Falls. °· Physical violence. °· Sports injuries. °SYMPTOMS  °· Chest pain. The pain may be worse when you move or breathe deeply. °· Shortness of breath. °· Lightheadedness. °· Bruising. °· Tenderness. °· Swelling. °DIAGNOSIS  °Your caregiver will do a physical exam. X-rays may be taken to look for fractures. However, minor rib fractures may not show up on X-rays until a few days after the injury. If a more serious injury is suspected, further imaging tests may be done. This may include ultrasounds, computed tomography (CT) scans, or magnetic resonance imaging (MRI). °TREATMENT  °Treatment depends on the severity of your injury. Your caregiver may prescribe pain medicines and deep breathing exercises. °HOME CARE INSTRUCTIONS °· Limit your activities until you can move around without much pain. °· Do not do any strenuous work until your injury is healed. °· Put ice on the injured area. °¨ Put ice in a plastic bag. °¨ Place a towel between your skin and the bag. °¨ Leave the ice on for 15-20 minutes, 03-04 times a day. °· You may wear a rib belt as directed by your caregiver to reduce pain. °· Practice deep breathing as directed by your caregiver to keep your lungs clear. °· Only take over-the-counter or prescription medicines for pain, fever, or discomfort as directed by your caregiver. °SEEK IMMEDIATE MEDICAL  CARE IF:  °· You have increasing pain or shortness of breath. °· You cough up blood. °· You have nausea, vomiting, or abdominal pain. °· You have a fever. °· You feel dizzy, weak, or you faint. °MAKE SURE YOU: °· Understand these instructions. °· Will watch your condition. °· Will get help right away if you are not doing well or get worse. °Document Released: 02/24/2004 Document Revised: 04/10/2011 Document Reviewed: 11/02/2010 °Exit[employer]® Patient Information ©2015 [employer]. This information is not intended to replace advice given to you by your health care provider. Make sure you discuss any questions you have with your health care provider. ° ° °
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
## 2014-10-28 LAB — HM DIABETES EYE EXAM

## 2014-10-30 ENCOUNTER — Encounter: Payer: Self-pay | Admitting: Family Medicine

## 2014-10-30 ENCOUNTER — Ambulatory Visit (INDEPENDENT_AMBULATORY_CARE_PROVIDER_SITE_OTHER): Payer: BC Managed Care – PPO | Admitting: Family Medicine

## 2014-10-30 VITALS — BP 131/85 | HR 93 | Temp 98.8°F | Ht 61.0 in | Wt 173.8 lb

## 2014-10-30 DIAGNOSIS — E1349 Other specified diabetes mellitus with other diabetic neurological complication: Secondary | ICD-10-CM | POA: Diagnosis not present

## 2014-10-30 DIAGNOSIS — E11319 Type 2 diabetes mellitus with unspecified diabetic retinopathy without macular edema: Secondary | ICD-10-CM | POA: Insufficient documentation

## 2014-10-30 DIAGNOSIS — I10 Essential (primary) hypertension: Secondary | ICD-10-CM | POA: Diagnosis not present

## 2014-10-30 DIAGNOSIS — E1365 Other specified diabetes mellitus with hyperglycemia: Principal | ICD-10-CM

## 2014-10-30 DIAGNOSIS — IMO0002 Reserved for concepts with insufficient information to code with codable children: Secondary | ICD-10-CM

## 2014-10-30 DIAGNOSIS — M549 Dorsalgia, unspecified: Secondary | ICD-10-CM

## 2014-10-30 DIAGNOSIS — Z23 Encounter for immunization: Secondary | ICD-10-CM

## 2014-10-30 DIAGNOSIS — G63 Polyneuropathy in diseases classified elsewhere: Secondary | ICD-10-CM

## 2014-10-30 DIAGNOSIS — E08311 Diabetes mellitus due to underlying condition with unspecified diabetic retinopathy with macular edema: Secondary | ICD-10-CM
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
## 2014-10-30 NOTE — Patient Instructions (Addendum)
Great job working taking the meds.  Increase levemir up to 40 units.  Continue metformin 1000 mg daily  Can use muscle relaxant at night for muscle spasm.  Massage on midback, ice, and start gentle stretching. Can use ibuprofen 800 mg every eight hours for pain.

## 2014-10-30 NOTE — Assessment & Plan Note (Signed)
Can use muscle relaxant at night for muscle spasm.  Massage on midback, ice, and start gentle stretching. Can use ibuprofen 800 mg every eight hours for pain.

## 2014-10-30 NOTE — Assessment & Plan Note (Signed)
Well controlled. Continue current medication.  

## 2014-10-30 NOTE — Assessment & Plan Note (Signed)
Poor control but improved with taking med more regularly.  Had symptoms when CBGs in nml range. Will have her increase back up to at least 40 Units insulin now that she is back on metformin 1000mg  daily.  Encouraged her to continue to take meds daily for next 3 months so we can determine her response.  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

## 2014-10-30 NOTE — Progress Notes (Signed)
Pre visit review using our clinic review tool, if applicable. No additional management support is needed unless otherwise documented below in the visit note. 

## 2014-11-02 ENCOUNTER — Telehealth: Payer: Self-pay | Admitting: *Deleted

## 2014-11-02 NOTE — Telephone Encounter (Signed)
Ms. Lady called with her fasting blood sugar reading.  10/29/2014:  96 mg/dl                                                                                        10/30/2014:  84 mg/dl                                                                                        10/31/2014:  69 mg/dl                                                                                        11/01/2014:  68 mg/dl                                                                                        11/02/2014   64 mg/dl Patient states she is currently taking Levemir 40 units and Metformin 1000 mg daily.

## 2014-11-03 NOTE — Telephone Encounter (Signed)
Have her decrease insulin to 35 units daily. Continue metformin.

## 2014-11-03 NOTE — Telephone Encounter (Signed)
Left message for Patricia Horne to decrease her Levemir to 35 units daily and continue Metformin 1000 mg daily.

## 2014-11-06 ENCOUNTER — Telehealth: Payer: Self-pay

## 2014-11-06 ENCOUNTER — Inpatient Hospital Stay: Admission: RE | Admit: 2014-11-06 | Payer: BC Managed Care – PPO | Source: Ambulatory Visit

## 2014-11-06 ENCOUNTER — Ambulatory Visit
Admission: RE | Admit: 2014-11-06 | Discharge: 2014-11-06 | Disposition: A | Discharge status: Home / Self Care | Payer: BC Managed Care – PPO | Source: Ambulatory Visit | Attending: Family Medicine | Admitting: Family Medicine

## 2014-11-06 ENCOUNTER — Ambulatory Visit (INDEPENDENT_AMBULATORY_CARE_PROVIDER_SITE_OTHER): Payer: BC Managed Care – PPO | Admitting: Family Medicine

## 2014-11-06 ENCOUNTER — Encounter: Payer: Self-pay | Admitting: Family Medicine

## 2014-11-06 VITALS — BP 147/88 | HR 97 | Temp 98.3°F | Ht 61.0 in | Wt 175.5 lb

## 2014-11-06 DIAGNOSIS — M546 Pain in thoracic spine: Secondary | ICD-10-CM | POA: Insufficient documentation

## 2014-11-06 DIAGNOSIS — R1011 Right upper quadrant pain: Secondary | ICD-10-CM

## 2014-11-06 DIAGNOSIS — M542 Cervicalgia: Secondary | ICD-10-CM

## 2014-11-06 DIAGNOSIS — R0789 Other chest pain: Secondary | ICD-10-CM | POA: Diagnosis present

## 2014-11-06 LAB — POCT I-STAT CREATININE: Creatinine, Ser: 0.6 mg/dL (ref 0.44–1.00)

## 2014-11-06 MED ORDER — DICLOFENAC SODIUM 75 MG PO TBEC: 75.0000 mg | DELAYED_RELEASE_TABLET | Freq: Two times a day (BID) | ORAL | Status: DC

## 2014-11-06 MED ORDER — IOHEXOL 350 MG/ML SOLN
80.0000 mL | Freq: Once | INTRAVENOUS | Status: AC | PRN
  Administered 2014-11-06: 80 mL via INTRAVENOUS

## 2014-11-06 NOTE — Progress Notes (Signed)
Pre visit review using our clinic review tool, if applicable. No additional management support is needed unless otherwise documented below in the visit note. 
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
## 2014-11-06 NOTE — Patient Instructions (Signed)
Stop at front desk to set up CT scan of abdomen. Remain out of work x 1 week for now. Can use hydrocodone for pain. Hold other meds for now.  If no sign of liver injury we will consider anti-nflammatory to treat daytime pain.
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
## 2014-11-13 ENCOUNTER — Ambulatory Visit (INDEPENDENT_AMBULATORY_CARE_PROVIDER_SITE_OTHER): Payer: BC Managed Care – PPO | Admitting: Family Medicine

## 2014-11-13 ENCOUNTER — Encounter: Payer: Self-pay | Admitting: Family Medicine

## 2014-11-13 VITALS — BP 140/81 | HR 86 | Temp 98.3°F | Ht 61.0 in | Wt 175.8 lb

## 2014-11-13 DIAGNOSIS — R1011 Right upper quadrant pain: Secondary | ICD-10-CM

## 2014-11-13 DIAGNOSIS — R0789 Other chest pain: Secondary | ICD-10-CM

## 2014-11-13 DIAGNOSIS — M549 Dorsalgia, unspecified: Secondary | ICD-10-CM

## 2014-11-13 MED ORDER — GLUCOSE BLOOD VI STRP: ORAL_STRIP | Status: DC

## 2014-11-13 NOTE — Patient Instructions (Addendum)
Can continue muscle relaxant at night and chiropractor visits.  Can use tylenol or ibuprofen for pain.  Call supervisor about returning part-time, 6 hour shift.   Cancel 10/328 OV.
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
## 2014-11-13 NOTE — Assessment & Plan Note (Signed)
Resolved

## 2014-11-13 NOTE — Progress Notes (Signed)
Pre visit review using our clinic review tool, if applicable. No additional management support is needed unless otherwise documented below in the visit note. 

## 2014-11-13 NOTE — Assessment & Plan Note (Signed)
Improved. Continue muscle relaxant. Try ibuprofen for inflammation. Continue massage, gentle stretching and can continue with chiropractor.

## 2014-11-13 NOTE — Assessment & Plan Note (Signed)
resolved 

## 2014-11-27 ENCOUNTER — Ambulatory Visit: Payer: BC Managed Care – PPO | Admitting: Family Medicine

## 2014-11-30 ENCOUNTER — Telehealth: Payer: Self-pay | Admitting: Family Medicine

## 2014-11-30 NOTE — Telephone Encounter (Signed)
Pt needs proof of flu shot.  Pt is asking for letter to be faxed to [phone]. cb number is [phone]. Pt is asking for a call before faxing so she knows to be expecting it.  Thank you

## 2014-11-30 NOTE — Telephone Encounter (Signed)
Immunization record faxed to [phone] as requested.  Shirlene notified.

## 2014-12-07 ENCOUNTER — Other Ambulatory Visit (INDEPENDENT_AMBULATORY_CARE_PROVIDER_SITE_OTHER): Payer: BC Managed Care – PPO

## 2014-12-07 ENCOUNTER — Other Ambulatory Visit: Payer: BC Managed Care – PPO

## 2014-12-07 ENCOUNTER — Telehealth: Payer: Self-pay | Admitting: Family Medicine

## 2014-12-07 DIAGNOSIS — IMO0002 Reserved for concepts with insufficient information to code with codable children: Secondary | ICD-10-CM

## 2014-12-07 DIAGNOSIS — E1349 Other specified diabetes mellitus with other diabetic neurological complication: Secondary | ICD-10-CM | POA: Diagnosis not present

## 2014-12-07 DIAGNOSIS — E1365 Other specified diabetes mellitus with hyperglycemia: Secondary | ICD-10-CM

## 2014-12-07 LAB — MICROALBUMIN / CREATININE URINE RATIO
Creatinine,U: 192.4 mg/dL
MICROALB/CREAT RATIO: 0.5 mg/g (ref 0.0–30.0)
Microalb, Ur: 1 mg/dL (ref 0.0–1.9)

## 2014-12-07 LAB — HEMOGLOBIN A1C: HEMOGLOBIN A1C: 8.7 % — AB (ref 4.6–6.5)

## 2014-12-07 NOTE — Telephone Encounter (Signed)
-----   Message from Ellamae Sia sent at 12/03/2014  2:56 PM EDT ----- Regarding: Lab orders for Monday, 11.7.16 Lab orders for a 4 week f/u

## 2014-12-11 ENCOUNTER — Ambulatory Visit (INDEPENDENT_AMBULATORY_CARE_PROVIDER_SITE_OTHER): Payer: BC Managed Care – PPO | Admitting: Family Medicine

## 2014-12-11 ENCOUNTER — Encounter: Payer: Self-pay | Admitting: *Deleted

## 2014-12-11 ENCOUNTER — Encounter: Payer: Self-pay | Admitting: Family Medicine

## 2014-12-11 VITALS — BP 149/92 | HR 91 | Temp 98.2°F | Ht 61.0 in | Wt 173.0 lb

## 2014-12-11 DIAGNOSIS — Z9114 Patient's other noncompliance with medication regimen: Secondary | ICD-10-CM | POA: Diagnosis not present

## 2014-12-11 DIAGNOSIS — E1365 Other specified diabetes mellitus with hyperglycemia: Secondary | ICD-10-CM | POA: Diagnosis not present

## 2014-12-11 DIAGNOSIS — E1349 Other specified diabetes mellitus with other diabetic neurological complication: Secondary | ICD-10-CM | POA: Diagnosis not present

## 2014-12-11 DIAGNOSIS — I10 Essential (primary) hypertension: Secondary | ICD-10-CM

## 2014-12-11 DIAGNOSIS — IMO0002 Reserved for concepts with insufficient information to code with codable children: Secondary | ICD-10-CM

## 2014-12-11 LAB — HM DIABETES FOOT EXAM

## 2014-12-11 MED ORDER — AMLODIPINE BESYLATE 10 MG PO TABS: 10.0000 mg | ORAL_TABLET | Freq: Every day | ORAL | Status: DC

## 2014-12-11 NOTE — Assessment & Plan Note (Signed)
Much better now taking insulin. Pt will  Continue. A1C takes into consideration 2 months not 3.. Hopefully it will be lower next OV. Pt will look into trulicity. Encouraged exercise, weight loss, healthy eating habits.

## 2014-12-11 NOTE — Assessment & Plan Note (Signed)
Has not taken med today.Marland Kitchen She will follow at home and take med regularly.

## 2014-12-11 NOTE — Progress Notes (Signed)
Subjective:    Patient ID: Patricia Horne, female    DOB: [birth year], 64 y.o.   MRN: [id number]  HPI 64 year old female presents for DM follow up .   Diabetes:   Improved DM control! She is taking metformin and 35 units of Levemir. She is doing better with taking medication. Lab Results  Component Value Date   HGBA1C 8.7* 12/07/2014  Using medications without difficulties: Hypoglycemic episodes:None Hyperglycemic episodes: Occ depending on meal or if skipped meds. Feet problems: none Blood Sugars averaging: FBS 96-110 eye exam within last year:  She is not exercising.  Neg microalbumin.  Wt Readings from Last 3 Encounters:  12/11/14 173 lb (78.472 kg)  11/13/14 175 lb 12 oz (79.72 kg)  11/06/14 175 lb 8 oz (79.606 kg)    Body mass index is 32.7 kg/(m^2).   Hypertension:   Elevatred today  But has not taken her amlodipine 10 mg  Today.  BP Readings from Last 3 Encounters:  12/11/14 149/92  11/13/14 140/81  11/06/14 147/88  Using medication without problems or lightheadedness:  Chest pain with exertion: Edema: Short of breath: Average home BPs: not  Checking. Other issues:      Review of Systems  Constitutional: Negative for fever and fatigue.  HENT: Negative for ear pain.   Eyes: Negative for pain.  Respiratory: Negative for chest tightness and shortness of breath.   Cardiovascular: Negative for chest pain, palpitations and leg swelling.  Gastrointestinal: Negative for abdominal pain.  Genitourinary: Negative for dysuria.       Objective:   Physical Exam  Constitutional: Vital signs are normal. She appears well-developed and well-nourished. She is cooperative.  Non-toxic appearance. She does not appear ill. No distress.  HENT:  Head: Normocephalic.  Right Ear: Hearing, tympanic membrane, external ear and ear canal normal. Tympanic membrane is not erythematous, not retracted and not bulging.  Left Ear: Hearing, tympanic membrane, external ear and ear  canal normal. Tympanic membrane is not erythematous, not retracted and not bulging.  Nose: No mucosal edema or rhinorrhea. Right sinus exhibits no maxillary sinus tenderness and no frontal sinus tenderness. Left sinus exhibits no maxillary sinus tenderness and no frontal sinus tenderness.  Mouth/Throat: Uvula is midline, oropharynx is clear and moist and mucous membranes are normal.  Eyes: Conjunctivae, EOM and lids are normal. Pupils are equal, round, and reactive to light. Lids are everted and swept, no foreign bodies found.  Neck: Trachea normal and normal range of motion. Neck supple. Carotid bruit is not present. No thyroid mass and no thyromegaly present.  Cardiovascular: Normal rate, regular rhythm, S1 normal, S2 normal, normal heart sounds, intact distal pulses and normal pulses.  Exam reveals no gallop and no friction rub.   No murmur heard. Pulmonary/Chest: Effort normal and breath sounds normal. No tachypnea. No respiratory distress. She has no decreased breath sounds. She has no wheezes. She has no rhonchi. She has no rales.  Abdominal: Soft. Normal appearance and bowel sounds are normal. There is no tenderness.  Neurological: She is alert.  Skin: Skin is warm, dry and intact. No rash noted.  Psychiatric: Her speech is normal and behavior is normal. Judgment and thought content normal. Her mood appears not anxious. Cognition and memory are normal. She does not exhibit a depressed mood.  Diabetic foot exam: Normal inspection No skin breakdown No calluses  Normal DP pulses Normal sensation to light touch and monofilament Nails normal         Assessment &  Plan:

## 2014-12-11 NOTE — Patient Instructions (Addendum)
Keep working on taking medication every day.! [place] job! Can look into Trulicity weekly injections. Follow BP at home and call with measurements in 1-2 weeks.

## 2014-12-11 NOTE — Progress Notes (Signed)
Pre visit review using our clinic review tool, if applicable. No additional management support is needed unless otherwise documented below in the visit note. 

## 2015-01-06 ENCOUNTER — Other Ambulatory Visit: Payer: Self-pay | Admitting: Family Medicine

## 2015-01-06 ENCOUNTER — Telehealth: Payer: Self-pay | Admitting: Family Medicine

## 2015-01-06 DIAGNOSIS — N644 Mastodynia: Secondary | ICD-10-CM

## 2015-01-06 NOTE — Telephone Encounter (Signed)
Spoke with Patricia Horne and [name] needs diagnostic mammogram With ultra sound limited right and left  Pt stated on the right breast burning was at 9 oclock position And left breast burning @ 3 o clock

## 2015-01-06 NOTE — Telephone Encounter (Signed)
Let me know if [place] needs to be added.

## 2015-01-06 NOTE — Addendum Note (Signed)
Addended by: Eliezer Lofts E on: 01/06/2015 10:42 AM   Modules accepted: Orders

## 2015-01-06 NOTE — Telephone Encounter (Signed)
Patient called [name] to schedule screening mammogram.  Patient told them she's having burning in both breasts.  They told her to call her PCP to get an order to have the mammogram done. Patient prefers a morning appointment.

## 2015-01-06 NOTE — Addendum Note (Signed)
Addended byEliezer Lofts E on: 01/06/2015 12:40 PM   Modules accepted: Orders

## 2015-01-06 NOTE — Telephone Encounter (Signed)
Patricia Horne, please advise

## 2015-01-20 ENCOUNTER — Ambulatory Visit
Admission: RE | Admit: 2015-01-20 | Discharge: 2015-01-20 | Disposition: A | Discharge status: Home / Self Care | Payer: BC Managed Care – PPO | Source: Ambulatory Visit | Attending: Family Medicine | Admitting: Family Medicine

## 2015-01-20 DIAGNOSIS — N644 Mastodynia: Secondary | ICD-10-CM

## 2015-02-12 ENCOUNTER — Ambulatory Visit (INDEPENDENT_AMBULATORY_CARE_PROVIDER_SITE_OTHER): Payer: BC Managed Care – PPO | Admitting: Family Medicine

## 2015-02-12 ENCOUNTER — Encounter: Payer: Self-pay | Admitting: Family Medicine

## 2015-02-12 DIAGNOSIS — M25562 Pain in left knee: Secondary | ICD-10-CM | POA: Diagnosis not present

## 2015-02-12 DIAGNOSIS — M549 Dorsalgia, unspecified: Secondary | ICD-10-CM | POA: Diagnosis not present

## 2015-02-12 DIAGNOSIS — M545 Low back pain, unspecified: Secondary | ICD-10-CM | POA: Insufficient documentation

## 2015-02-12 DIAGNOSIS — M25561 Pain in right knee: Secondary | ICD-10-CM | POA: Diagnosis not present

## 2015-02-12 MED ORDER — MELOXICAM 15 MG PO TABS: 15.0000 mg | ORAL_TABLET | Freq: Every day | ORAL | Status: DC

## 2015-02-12 NOTE — Assessment & Plan Note (Signed)
Trial of meloxicam. No clear indication for X-ray. Start home PT, info given., Continue with chiropractor given helping.

## 2015-02-12 NOTE — Progress Notes (Signed)
Pre visit review using our clinic review tool, if applicable. No additional management support is needed unless otherwise documented below in the visit note. 

## 2015-02-12 NOTE — Addendum Note (Signed)
Addended byEliezer Lofts E on: 02/12/2015 12:52 PM   Modules accepted: Orders

## 2015-02-12 NOTE — Assessment & Plan Note (Addendum)
No indication for X-ray.  Treat with meloxicam and start home PT for increase ROM.

## 2015-02-12 NOTE — Patient Instructions (Addendum)
Trial of meloxicam for pain and inflammation in knees and back. Call if interested in further work up. Start water aerobics. Start home PT.
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
## 2015-03-12 ENCOUNTER — Other Ambulatory Visit: Payer: Self-pay | Admitting: Family Medicine

## 2015-03-12 NOTE — Telephone Encounter (Signed)
Last office visit 02/12/2015.  Last refilled 02/12/2015 for #30 with no refills.  Ok to refill?

## 2015-03-30 ENCOUNTER — Telehealth: Payer: Self-pay | Admitting: Family Medicine

## 2015-03-30 ENCOUNTER — Other Ambulatory Visit: Payer: Self-pay | Admitting: Family Medicine

## 2015-03-30 ENCOUNTER — Other Ambulatory Visit (INDEPENDENT_AMBULATORY_CARE_PROVIDER_SITE_OTHER): Payer: BC Managed Care – PPO

## 2015-03-30 DIAGNOSIS — IMO0002 Reserved for concepts with insufficient information to code with codable children: Secondary | ICD-10-CM

## 2015-03-30 DIAGNOSIS — Z1159 Encounter for screening for other viral diseases: Secondary | ICD-10-CM

## 2015-03-30 DIAGNOSIS — E1349 Other specified diabetes mellitus with other diabetic neurological complication: Secondary | ICD-10-CM

## 2015-03-30 DIAGNOSIS — E1365 Other specified diabetes mellitus with hyperglycemia: Secondary | ICD-10-CM | POA: Diagnosis not present

## 2015-03-30 LAB — LIPID PANEL
CHOL/HDL RATIO: 3
Cholesterol: 141 mg/dL (ref 0–200)
HDL: 45.1 mg/dL (ref >39.00)
LDL Cholesterol: 76 mg/dL (ref 0–99)
NONHDL: 96.18
Triglycerides: 100 mg/dL (ref 0.0–149.0)
VLDL: 20 mg/dL (ref 0.0–40.0)

## 2015-03-30 LAB — COMPREHENSIVE METABOLIC PANEL
ALT: 11 U/L (ref 0–35)
AST: 13 U/L (ref 0–37)
Albumin: 3.7 g/dL (ref 3.5–5.2)
Alkaline Phosphatase: 97 U/L (ref 39–117)
BILIRUBIN TOTAL: 0.8 mg/dL (ref 0.2–1.2)
BUN: 14 mg/dL (ref 6–23)
CO2: 28 meq/L (ref 19–32)
Calcium: 9.1 mg/dL (ref 8.4–10.5)
Chloride: 105 mEq/L (ref 96–112)
Creatinine, Ser: 0.64 mg/dL (ref 0.40–1.20)
GFR: 119.93 mL/min (ref >60.00)
GLUCOSE: 305 mg/dL — AB (ref 70–99)
Potassium: 4.1 mEq/L (ref 3.5–5.1)
SODIUM: 139 meq/L (ref 135–145)
Total Protein: 7.2 g/dL (ref 6.0–8.3)

## 2015-03-30 LAB — HEMOGLOBIN A1C: Hgb A1c MFr Bld: 13 % — ABNORMAL HIGH (ref 4.6–6.5)

## 2015-03-30 NOTE — Telephone Encounter (Signed)
-----   Message from Ellamae Sia sent at 03/24/2015  3:19 PM EST ----- Regarding: Lab orders for Tuesday,2.28.17 Patient is scheduled for CPX labs, please order future labs, Thanks , Karna Christmas

## 2015-03-31 ENCOUNTER — Telehealth: Payer: Self-pay | Admitting: Family Medicine

## 2015-03-31 LAB — HEPATITIS C ANTIBODY: HCV Ab: NEGATIVE

## 2015-03-31 NOTE — Telephone Encounter (Signed)
Spoke with Patricia Horne.  She states she woke up this morning feeling bad and went to take her blood pressure medication but couldn't find it.  She can't get through to her pharmacy so she was wanting to know if she had refills on her blood pressure. Per chart she should have refills available.  She will contact pharmacy.

## 2015-03-31 NOTE — Telephone Encounter (Signed)
Please call patient back about her medication.  Patient didn't want to be sent to Triage.

## 2015-04-06 ENCOUNTER — Encounter: Payer: BC Managed Care – PPO | Admitting: Family Medicine

## 2015-04-13 ENCOUNTER — Ambulatory Visit (INDEPENDENT_AMBULATORY_CARE_PROVIDER_SITE_OTHER): Payer: BC Managed Care – PPO | Admitting: Family Medicine

## 2015-04-13 ENCOUNTER — Encounter: Payer: Self-pay | Admitting: Family Medicine

## 2015-04-13 VITALS — BP 135/83 | HR 98 | Temp 97.8°F | Ht 61.0 in | Wt 171.8 lb

## 2015-04-13 DIAGNOSIS — M25561 Pain in right knee: Secondary | ICD-10-CM

## 2015-04-13 DIAGNOSIS — I10 Essential (primary) hypertension: Secondary | ICD-10-CM

## 2015-04-13 DIAGNOSIS — K5900 Constipation, unspecified: Secondary | ICD-10-CM | POA: Diagnosis not present

## 2015-04-13 DIAGNOSIS — E1349 Other specified diabetes mellitus with other diabetic neurological complication: Secondary | ICD-10-CM | POA: Diagnosis not present

## 2015-04-13 DIAGNOSIS — M25562 Pain in left knee: Secondary | ICD-10-CM

## 2015-04-13 DIAGNOSIS — IMO0002 Reserved for concepts with insufficient information to code with codable children: Secondary | ICD-10-CM

## 2015-04-13 DIAGNOSIS — E1365 Other specified diabetes mellitus with hyperglycemia: Secondary | ICD-10-CM

## 2015-04-13 DIAGNOSIS — K5909 Other constipation: Secondary | ICD-10-CM

## 2015-04-13 DIAGNOSIS — Z0000 Encounter for general adult medical examination without abnormal findings: Secondary | ICD-10-CM | POA: Diagnosis not present

## 2015-04-13 LAB — HM DIABETES FOOT EXAM

## 2015-04-13 MED ORDER — METFORMIN HCL 1000 MG PO TABS: 1000.0000 mg | ORAL_TABLET | Freq: Two times a day (BID) | ORAL | Status: DC
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
## 2015-04-13 NOTE — Patient Instructions (Addendum)
Increase fiber in diet. Can Benefiber to increase fiber  Can use miralax daily for constipaition.  Avoid juice and sweets, carbohydrates.  Increase water in diet.  Restart metformin and increase up to high dose. Call with blood sugars in next 2-3 weeks.  When doing mammogram next December .Marland Kitchen Need to do bone density as well.

## 2015-04-13 NOTE — Assessment & Plan Note (Signed)
Well controlled. Continue current medication.  

## 2015-04-13 NOTE — Assessment & Plan Note (Signed)
Miralax daily, add fiber and water. Add benefiber.

## 2015-04-13 NOTE — Assessment & Plan Note (Addendum)
Very poor control. Pt willing to restart metformin and increase to max. Refuses to take lantus.

## 2015-04-13 NOTE — Assessment & Plan Note (Signed)
Stable. Likely OA.  Recommend home PT and strengthening.

## 2015-04-13 NOTE — Progress Notes (Signed)
Pre visit review using our clinic review tool, if applicable. No additional management support is needed unless otherwise documented below in the visit note. 

## 2015-05-25 ENCOUNTER — Ambulatory Visit (INDEPENDENT_AMBULATORY_CARE_PROVIDER_SITE_OTHER): Payer: BC Managed Care – PPO | Admitting: Primary Care

## 2015-05-25 ENCOUNTER — Ambulatory Visit (INDEPENDENT_AMBULATORY_CARE_PROVIDER_SITE_OTHER)
Admission: RE | Admit: 2015-05-25 | Discharge: 2015-05-25 | Disposition: A | Discharge status: Home / Self Care | Payer: BC Managed Care – PPO | Source: Ambulatory Visit | Attending: Primary Care | Admitting: Primary Care

## 2015-05-25 ENCOUNTER — Encounter: Payer: Self-pay | Admitting: Primary Care

## 2015-05-25 VITALS — BP 146/82 | HR 95 | Temp 97.8°F | Ht 61.0 in | Wt 171.8 lb

## 2015-05-25 DIAGNOSIS — M25552 Pain in left hip: Secondary | ICD-10-CM
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
## 2015-05-25 NOTE — Patient Instructions (Signed)
Complete xray(s) prior to leaving today. I will notify you of your results once received.  It was a pleasure meeting you!  Hip Pain Your hip is the joint between your upper legs and your lower pelvis. The bones, cartilage, tendons, and muscles of your hip joint perform a lot of work each day supporting your body weight and allowing you to move around. Hip pain can range from a minor ache to severe pain in one or both of your hips. Pain may be felt on the inside of the hip joint near the groin, or the outside near the buttocks and upper thigh. You may have swelling or stiffness as well.  HOME CARE INSTRUCTIONS   Take medicines only as directed by your health care provider.  Apply ice to the injured area:  Put ice in a plastic bag.  Place a towel between your skin and the bag.  Leave the ice on for 15-20 minutes at a time, 3-4 times a day.  Keep your leg raised (elevated) when possible to lessen swelling.  Avoid activities that cause pain.  Follow specific exercises as directed by your health care provider.  Sleep with a pillow between your legs on your most comfortable side.  Record how often you have hip pain, the location of the pain, and what it feels like. SEEK MEDICAL CARE IF:   You are unable to put weight on your leg.  Your hip is red or swollen or very tender to touch.  Your pain or swelling continues or worsens after 1 week.  You have increasing difficulty walking.  You have a fever. SEEK IMMEDIATE MEDICAL CARE IF:   You have fallen.  You have a sudden increase in pain and swelling in your hip. MAKE SURE YOU:   Understand these instructions.  Will watch your condition.  Will get help right away if you are not doing well or get worse.   This information is not intended to replace advice given to you by your health care provider. Make sure you discuss any questions you have with your health care provider.   Document Released: 07/06/2009 Document Revised:  02/06/2014 Document Reviewed: 09/12/2012 Elsevier Interactive Patient Education [employer].

## 2015-05-25 NOTE — Progress Notes (Signed)
Pre visit review using our clinic review tool, if applicable. No additional management support is needed unless otherwise documented below in the visit note. 

## 2015-05-26 ENCOUNTER — Telehealth: Payer: Self-pay | Admitting: Primary Care
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
## 2015-05-26 NOTE — Telephone Encounter (Signed)
Patient called to get the results of her x-ray.  Patient said she doesn't use my chart.  I deactivated my chart.  Please call patient with results.

## 2015-05-26 NOTE — Telephone Encounter (Signed)
Left message to return call 

## 2015-05-27 ENCOUNTER — Other Ambulatory Visit: Payer: Self-pay | Admitting: Primary Care

## 2015-05-27 DIAGNOSIS — M25559 Pain in unspecified hip: Secondary | ICD-10-CM

## 2015-05-27 NOTE — Telephone Encounter (Signed)
Noted. PT referral placed.

## 2015-05-27 NOTE — Telephone Encounter (Signed)
Pt returned call and requests Call back this morning at work number [phone]

## 2015-05-27 NOTE — Telephone Encounter (Signed)
Called and notified patient of Kate's comments. Patient verbalized understanding.  Patient stated that she would try physical therapy and wants at [facility].

## 2015-06-18 ENCOUNTER — Ambulatory Visit: Payer: BC Managed Care – PPO | Admitting: Family Medicine

## 2015-06-22 ENCOUNTER — Encounter: Payer: Self-pay | Admitting: Family Medicine

## 2015-06-22 ENCOUNTER — Ambulatory Visit (INDEPENDENT_AMBULATORY_CARE_PROVIDER_SITE_OTHER): Payer: BC Managed Care – PPO | Admitting: Family Medicine

## 2015-06-22 VITALS — BP 172/104 | HR 85 | Temp 98.3°F | Ht 61.0 in | Wt 171.0 lb

## 2015-06-22 DIAGNOSIS — M25561 Pain in right knee: Secondary | ICD-10-CM

## 2015-06-22 DIAGNOSIS — G8929 Other chronic pain: Secondary | ICD-10-CM | POA: Insufficient documentation

## 2015-06-22 DIAGNOSIS — M25562 Pain in left knee: Secondary | ICD-10-CM

## 2015-06-22 DIAGNOSIS — M5417 Radiculopathy, lumbosacral region: Secondary | ICD-10-CM | POA: Diagnosis not present

## 2015-06-22 DIAGNOSIS — M5416 Radiculopathy, lumbar region: Secondary | ICD-10-CM | POA: Insufficient documentation
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
## 2015-06-22 NOTE — Assessment & Plan Note (Signed)
More stiffness than pain. Recommend NSAIDs, start PT for this as well.

## 2015-06-22 NOTE — Patient Instructions (Signed)
Start meloxicam for pain and inflammation x 1-2 weeks. Start PT as planned. Call if not improving as expected.

## 2015-06-22 NOTE — Progress Notes (Signed)
Pre visit review using our clinic review tool, if applicable. No additional management support is needed unless otherwise documented below in the visit note. 

## 2015-06-22 NOTE — Assessment & Plan Note (Signed)
No clear hip pathology today on exam.. More consistent with source of sciatic nerve in back. Treat with NSAIDs, heat, keep referral to PT.

## 2015-06-30 ENCOUNTER — Ambulatory Visit: Payer: BC Managed Care – PPO | Admitting: Physical Therapy

## 2015-07-05 ENCOUNTER — Encounter: Payer: BC Managed Care – PPO | Admitting: Physical Therapy

## 2015-07-07 ENCOUNTER — Encounter: Payer: BC Managed Care – PPO | Admitting: Physical Therapy

## 2015-07-09 ENCOUNTER — Other Ambulatory Visit: Payer: BC Managed Care – PPO

## 2015-07-12 ENCOUNTER — Encounter: Payer: BC Managed Care – PPO | Admitting: Physical Therapy

## 2015-07-14 ENCOUNTER — Encounter: Payer: BC Managed Care – PPO | Admitting: Physical Therapy

## 2015-07-16 ENCOUNTER — Ambulatory Visit: Payer: BC Managed Care – PPO | Admitting: Family Medicine

## 2015-07-20 ENCOUNTER — Telehealth: Payer: Self-pay | Admitting: Family Medicine

## 2015-07-20 DIAGNOSIS — E1365 Other specified diabetes mellitus with hyperglycemia: Principal | ICD-10-CM

## 2015-07-20 DIAGNOSIS — IMO0002 Reserved for concepts with insufficient information to code with codable children: Secondary | ICD-10-CM

## 2015-07-20 DIAGNOSIS — E1349 Other specified diabetes mellitus with other diabetic neurological complication: Secondary | ICD-10-CM

## 2015-07-20 NOTE — Telephone Encounter (Signed)
-----   Message from Ellamae Sia sent at 07/12/2015  3:37 PM EDT ----- Regarding: Lab orders for Wednesday, 6.21.17 Lab orders for a 3 month follow up appt.

## 2015-07-21 ENCOUNTER — Ambulatory Visit: Payer: BC Managed Care – PPO | Attending: Primary Care

## 2015-07-21 ENCOUNTER — Other Ambulatory Visit (INDEPENDENT_AMBULATORY_CARE_PROVIDER_SITE_OTHER): Payer: BC Managed Care – PPO

## 2015-07-21 DIAGNOSIS — E1365 Other specified diabetes mellitus with hyperglycemia: Secondary | ICD-10-CM

## 2015-07-21 DIAGNOSIS — M25552 Pain in left hip: Secondary | ICD-10-CM | POA: Diagnosis not present

## 2015-07-21 DIAGNOSIS — E1349 Other specified diabetes mellitus with other diabetic neurological complication: Secondary | ICD-10-CM

## 2015-07-21 DIAGNOSIS — M25652 Stiffness of left hip, not elsewhere classified: Secondary | ICD-10-CM

## 2015-07-21 DIAGNOSIS — IMO0002 Reserved for concepts with insufficient information to code with codable children: Secondary | ICD-10-CM

## 2015-07-21 LAB — COMPREHENSIVE METABOLIC PANEL
ALT: 15 U/L (ref 0–35)
AST: 13 U/L (ref 0–37)
Albumin: 3.8 g/dL (ref 3.5–5.2)
Alkaline Phosphatase: 106 U/L (ref 39–117)
BILIRUBIN TOTAL: 0.7 mg/dL (ref 0.2–1.2)
BUN: 14 mg/dL (ref 6–23)
CO2: 28 meq/L (ref 19–32)
CREATININE: 0.67 mg/dL (ref 0.40–1.20)
Calcium: 9.2 mg/dL (ref 8.4–10.5)
Chloride: 103 mEq/L (ref 96–112)
GFR: 113.64 mL/min (ref >60.00)
GLUCOSE: 322 mg/dL — AB (ref 70–99)
Potassium: 4.1 mEq/L (ref 3.5–5.1)
SODIUM: 137 meq/L (ref 135–145)
Total Protein: 7.1 g/dL (ref 6.0–8.3)

## 2015-07-21 LAB — HEMOGLOBIN A1C: Hgb A1c MFr Bld: 12.6 % — ABNORMAL HIGH (ref 4.6–6.5)
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
## 2015-07-21 NOTE — Patient Instructions (Signed)
Piriformis Stretch - Supine    Pull L knee across body toward opposite shoulder. Hold slight stretch for _30-60__ seconds. Repeat _3-5__ times. Do _4__ times per day.  Piriformis Stretch, Sitting    Sit, one ankle on opposite knee, same-side hand on crossed knee. Push down on L knee, keeping spine straight. Lean torso forward, with flat back, until tension is felt in hamstrings and gluteals of crossed-leg side. Hold _30__ seconds.  Repeat _3-5__ times per session. Do _4__ sessions per day.   Stretching: Piriformis    Cross left leg over other thigh and place elbow over outside of knee. Gently stretch buttock muscles by pushing bent knee across body. Hold _30___ seconds. Repeat _3-5___ times per set. Do __4__ sessions per day.

## 2015-07-26 ENCOUNTER — Encounter: Payer: BC Managed Care – PPO | Admitting: Physical Therapy

## 2015-07-27 ENCOUNTER — Ambulatory Visit (INDEPENDENT_AMBULATORY_CARE_PROVIDER_SITE_OTHER): Payer: BC Managed Care – PPO | Admitting: Family Medicine

## 2015-07-27 ENCOUNTER — Encounter: Payer: Self-pay | Admitting: Family Medicine

## 2015-07-27 VITALS — BP 171/100 | HR 98 | Temp 98.2°F | Ht 61.0 in | Wt 173.2 lb

## 2015-07-27 DIAGNOSIS — E1349 Other specified diabetes mellitus with other diabetic neurological complication: Secondary | ICD-10-CM | POA: Diagnosis not present

## 2015-07-27 DIAGNOSIS — IMO0002 Reserved for concepts with insufficient information to code with codable children: Secondary | ICD-10-CM

## 2015-07-27 DIAGNOSIS — E1140 Type 2 diabetes mellitus with diabetic neuropathy, unspecified: Secondary | ICD-10-CM | POA: Diagnosis not present

## 2015-07-27 DIAGNOSIS — I10 Essential (primary) hypertension: Secondary | ICD-10-CM | POA: Diagnosis not present

## 2015-07-27 DIAGNOSIS — M25561 Pain in right knee: Secondary | ICD-10-CM

## 2015-07-27 DIAGNOSIS — F4310 Post-traumatic stress disorder, unspecified: Secondary | ICD-10-CM | POA: Diagnosis not present

## 2015-07-27 DIAGNOSIS — E1365 Other specified diabetes mellitus with hyperglycemia: Secondary | ICD-10-CM

## 2015-07-27 DIAGNOSIS — M25562 Pain in left knee: Secondary | ICD-10-CM

## 2015-07-27 LAB — HM DIABETES FOOT EXAM

## 2015-07-27 MED ORDER — INSULIN DETEMIR 100 UNIT/ML FLEXPEN: 50.0000 [IU] | PEN_INJECTOR | Freq: Every day | SUBCUTANEOUS | Status: DC

## 2015-07-27 MED ORDER — GLIPIZIDE ER 10 MG PO TB24: 10.0000 mg | ORAL_TABLET | Freq: Every day | ORAL | Status: DC
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
## 2015-07-27 NOTE — Progress Notes (Signed)
Pre visit review using our clinic review tool, if applicable. No additional management support is needed unless otherwise documented below in the visit note. 
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
## 2015-07-27 NOTE — Assessment & Plan Note (Signed)
Inadequate control on current meds. MAy be better controlled at home as well controlle din past. Follow BPs.. If not at goal at home.. Will need to adjust medicaiton.

## 2015-07-27 NOTE — Assessment & Plan Note (Signed)
Very poor control. Pt unable to tolerate metformin. Stop.  Trial of glucotrol XL given pt hesitant about newer medications.  Will likely also need Levemir.. Pt instructed to restart 50 units Levemir if blood sugars remain above goal. Encouraged exercise, weight loss, healthy eating habits.

## 2015-07-28 ENCOUNTER — Ambulatory Visit: Payer: BC Managed Care – PPO | Admitting: Physical Therapy

## 2015-08-02 ENCOUNTER — Ambulatory Visit: Payer: PPO | Admitting: Physical Therapy

## 2015-08-04 ENCOUNTER — Ambulatory Visit: Payer: PPO | Attending: Primary Care | Admitting: Physical Therapy

## 2015-08-04 DIAGNOSIS — M25652 Stiffness of left hip, not elsewhere classified: Secondary | ICD-10-CM | POA: Insufficient documentation

## 2015-08-04 DIAGNOSIS — M6281 Muscle weakness (generalized): Secondary | ICD-10-CM | POA: Insufficient documentation

## 2015-08-04 DIAGNOSIS — M25552 Pain in left hip: Secondary | ICD-10-CM | POA: Diagnosis not present
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
## 2015-08-09 ENCOUNTER — Encounter: Payer: BC Managed Care – PPO | Admitting: Physical Therapy

## 2015-08-11 ENCOUNTER — Ambulatory Visit: Payer: PPO | Admitting: Physical Therapy

## 2015-08-11 DIAGNOSIS — M25552 Pain in left hip: Secondary | ICD-10-CM | POA: Diagnosis not present

## 2015-08-11 DIAGNOSIS — M6281 Muscle weakness (generalized): Secondary | ICD-10-CM
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
## 2015-08-16 ENCOUNTER — Encounter: Payer: BC Managed Care – PPO | Admitting: Physical Therapy

## 2015-08-16 ENCOUNTER — Ambulatory Visit: Payer: PPO | Admitting: Physical Therapy

## 2015-08-16 DIAGNOSIS — M25552 Pain in left hip: Secondary | ICD-10-CM

## 2015-08-16 DIAGNOSIS — M6281 Muscle weakness (generalized): Secondary | ICD-10-CM
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
## 2015-08-18 ENCOUNTER — Ambulatory Visit: Payer: PPO | Admitting: Physical Therapy

## 2015-08-18 DIAGNOSIS — M6281 Muscle weakness (generalized): Secondary | ICD-10-CM

## 2015-08-18 DIAGNOSIS — M25552 Pain in left hip: Secondary | ICD-10-CM
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
## 2015-08-23 ENCOUNTER — Ambulatory Visit: Payer: PPO | Admitting: Physical Therapy

## 2015-08-23 ENCOUNTER — Encounter: Payer: Self-pay | Admitting: Physical Therapy

## 2015-08-23 ENCOUNTER — Encounter: Payer: BC Managed Care – PPO | Admitting: Physical Therapy

## 2015-08-23 DIAGNOSIS — M6281 Muscle weakness (generalized): Secondary | ICD-10-CM

## 2015-08-23 DIAGNOSIS — M25552 Pain in left hip: Secondary | ICD-10-CM | POA: Diagnosis not present
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
## 2015-08-25 ENCOUNTER — Encounter: Payer: BC Managed Care – PPO | Admitting: Physical Therapy

## 2015-08-30 ENCOUNTER — Telehealth: Payer: Self-pay | Admitting: Family Medicine

## 2015-08-30 NOTE — Telephone Encounter (Signed)
Pt left VM on triage phone requesting callback from [name], states it is reqarding her BP and other questions.

## 2015-08-30 NOTE — Telephone Encounter (Signed)
Returned patient's call and got her voicemail.   Left message that I would try to reach her again on Tuesday.

## 2015-08-31 ENCOUNTER — Ambulatory Visit: Payer: PPO | Attending: Primary Care | Admitting: Physical Therapy

## 2015-08-31 DIAGNOSIS — M25552 Pain in left hip: Secondary | ICD-10-CM | POA: Diagnosis not present

## 2015-08-31 DIAGNOSIS — M6281 Muscle weakness (generalized): Secondary | ICD-10-CM | POA: Diagnosis not present

## 2015-08-31 DIAGNOSIS — M25652 Stiffness of left hip, not elsewhere classified: Secondary | ICD-10-CM | POA: Diagnosis not present

## 2015-08-31 NOTE — Telephone Encounter (Signed)
Pt returned your call [phone] Pt has to leave to go to work @ 2:30 today

## 2015-08-31 NOTE — Telephone Encounter (Signed)
Noted. Needs to take DM meds and BP med daily.

## 2015-08-31 NOTE — Telephone Encounter (Signed)
Left message for Patricia Horne to return my call. 

## 2015-08-31 NOTE — Telephone Encounter (Signed)
Spoke with Patricia Horne.  She states her blood pressure has come down some.  Today her BP was 146/86.  She has been working really hard to take her BP medication daily. She states she is not taking any of her diabetes medications.   She is doing her PT.  Today they worked on her knee and hip.  She states she is still having to take something in order for her to have a BM.  Her next follow up appointment is scheduled for 10/29/2015.  I advised I would send the update to Dr. Diona Browner to review.

## 2015-09-01 ENCOUNTER — Ambulatory Visit: Payer: PPO | Admitting: Physical Therapy

## 2015-09-01 DIAGNOSIS — M25552 Pain in left hip: Secondary | ICD-10-CM

## 2015-09-01 NOTE — Telephone Encounter (Signed)
Ms. Tappen notified as instructed by telephone.  She said to let Dr. Diona Browner know she is trying.
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
## 2015-09-02 ENCOUNTER — Encounter: Payer: BC Managed Care – PPO | Admitting: Physical Therapy
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
## 2015-09-06 ENCOUNTER — Encounter: Payer: BC Managed Care – PPO | Admitting: Physical Therapy

## 2015-09-08 ENCOUNTER — Ambulatory Visit: Payer: PPO | Admitting: Physical Therapy

## 2015-09-08 DIAGNOSIS — M6281 Muscle weakness (generalized): Secondary | ICD-10-CM

## 2015-09-08 DIAGNOSIS — M25552 Pain in left hip: Secondary | ICD-10-CM | POA: Diagnosis not present
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
## 2015-09-10 ENCOUNTER — Encounter: Payer: BC Managed Care – PPO | Admitting: Physical Therapy

## 2015-09-14 ENCOUNTER — Encounter: Payer: BC Managed Care – PPO | Admitting: Physical Therapy

## 2015-09-15 ENCOUNTER — Ambulatory Visit: Payer: PPO | Admitting: Physical Therapy

## 2015-09-15 DIAGNOSIS — M25552 Pain in left hip: Secondary | ICD-10-CM

## 2015-09-15 DIAGNOSIS — M25652 Stiffness of left hip, not elsewhere classified: Secondary | ICD-10-CM

## 2015-09-15 DIAGNOSIS — M6281 Muscle weakness (generalized): Secondary | ICD-10-CM

## 2015-09-15 DIAGNOSIS — H6121 Impacted cerumen, right ear: Secondary | ICD-10-CM | POA: Diagnosis not present

## 2015-09-15 DIAGNOSIS — H93299 Other abnormal auditory perceptions, unspecified ear: Secondary | ICD-10-CM | POA: Diagnosis not present

## 2015-09-15 NOTE — Patient Instructions (Addendum)
Attempted sidelying clamshells, patient reported pain extending from posterior hip through lateral thigh with active movements, thus discontinued. Attempted soft tissue mobilization over indicated area of pain, no reduction with re-attempt at sidelying clamshells. No pain passively.   Piriformis stretch performed 4 sets x 10 repetitions of oscillations.  (no pain provoked)  Supine isometric clamshells (no pain) x 6, x 8 repetitions   Attempted more aggressive mobilization/stretch provided manually to area of pain reported (central piriformis muscle). 4 bouts x 15" -- attempted sidelying clamshell with PT assist again -- no change in pain reported.    Educated patient on self piriformis stretch as well as supine band resisted clamshells with green t-band.   Educated patient on self mobilization of hip into IR and flexion for piriformis stretch

## 2015-09-16 ENCOUNTER — Encounter: Payer: BC Managed Care – PPO | Admitting: Physical Therapy
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
## 2015-09-20 ENCOUNTER — Encounter: Payer: BC Managed Care – PPO | Admitting: Physical Therapy

## 2015-09-22 ENCOUNTER — Ambulatory Visit: Payer: PPO | Admitting: Physical Therapy

## 2015-09-22 DIAGNOSIS — M25552 Pain in left hip: Secondary | ICD-10-CM | POA: Diagnosis not present

## 2015-09-22 DIAGNOSIS — M25652 Stiffness of left hip, not elsewhere classified: Secondary | ICD-10-CM

## 2015-09-22 DIAGNOSIS — M6281 Muscle weakness (generalized): Secondary | ICD-10-CM
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
## 2015-09-22 NOTE — Patient Instructions (Addendum)
R sidelying clamshell (continues to be pain limited, able to complete with BW against gravity)   L piriformis stretch x 10 repetitions, no change in symptoms after  Lateral glide hip mobilizations to end range (felt like a stretch/tightening).    L rotation painful, R rotation not painful   Extension worse than flexion both painful   Negative slump, painful with hip flexor   5/5 IR, ER but painful in L hip  (posterior)   Pain in same spot with SLR on L, no pain on R   R hip ER/IR -- WNL , L hip IR severely limited  Pain noted with CPAs grade I painful in mid thoracic spine, ~ L3, L5  Attempted half kneeling hip flexor stretch, very difficult for her, switched to dangling hip flexor stretch 2 sets x 1 minute   Lateral glide with hip IR mobilizations 2 bouts x 45" to end range  Patient noted pain in hip was significantly decreased afterwards with sit to stands, some pain in HS, though mild.

## 2015-09-23 ENCOUNTER — Encounter: Payer: BC Managed Care – PPO | Admitting: Physical Therapy

## 2015-09-27 ENCOUNTER — Ambulatory Visit: Payer: PPO | Admitting: Physical Therapy

## 2015-09-27 DIAGNOSIS — M25552 Pain in left hip: Secondary | ICD-10-CM

## 2015-09-27 DIAGNOSIS — M6281 Muscle weakness (generalized): Secondary | ICD-10-CM

## 2015-09-27 DIAGNOSIS — M25652 Stiffness of left hip, not elsewhere classified: Secondary | ICD-10-CM

## 2015-09-27 NOTE — Patient Instructions (Addendum)
L rotation mild increase in pain, R rotation no increase in pain  SLR bilaterally -- no increase in pain  Extension/flexion in standing of lumbar spine -- stretching sensation in calves on flexion, no increase in pain.   R sidelying clamshell -- reports mild pain though able to complete against bodyweight   Ely's positive on L between 90-120 degrees   Pain around bra line with CPAs (treated with grade II)   Standing quad stretch (unable to perform prone)   Hip IR on LLE - limited to 0-5 degrees initially (mobilized and increased ROM though still ~ 50% decreased from RLE)
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
## 2015-09-29 ENCOUNTER — Ambulatory Visit: Payer: PPO | Admitting: Physical Therapy

## 2015-09-29 DIAGNOSIS — M6281 Muscle weakness (generalized): Secondary | ICD-10-CM

## 2015-09-29 DIAGNOSIS — M25552 Pain in left hip: Secondary | ICD-10-CM | POA: Diagnosis not present
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
## 2015-10-01 ENCOUNTER — Encounter: Payer: BC Managed Care – PPO | Admitting: Physical Therapy

## 2015-10-12 ENCOUNTER — Ambulatory Visit: Payer: PPO | Admitting: Family Medicine

## 2015-10-24 ENCOUNTER — Telehealth: Payer: Self-pay | Admitting: Family Medicine

## 2015-10-24 DIAGNOSIS — E1349 Other specified diabetes mellitus with other diabetic neurological complication: Secondary | ICD-10-CM

## 2015-10-24 DIAGNOSIS — E1365 Other specified diabetes mellitus with hyperglycemia: Principal | ICD-10-CM

## 2015-10-24 DIAGNOSIS — IMO0002 Reserved for concepts with insufficient information to code with codable children: Secondary | ICD-10-CM

## 2015-10-24 NOTE — Telephone Encounter (Signed)
-----   Message from Ellamae Sia sent at 10/20/2015  6:41 PM EDT ----- Regarding: Lab orders for Monday, 9.25.17 Lab orders for a 3 month follow up appt.

## 2015-10-25 ENCOUNTER — Other Ambulatory Visit (INDEPENDENT_AMBULATORY_CARE_PROVIDER_SITE_OTHER): Payer: PPO

## 2015-10-25 DIAGNOSIS — E1349 Other specified diabetes mellitus with other diabetic neurological complication: Secondary | ICD-10-CM

## 2015-10-25 DIAGNOSIS — E1365 Other specified diabetes mellitus with hyperglycemia: Secondary | ICD-10-CM | POA: Diagnosis not present

## 2015-10-25 DIAGNOSIS — IMO0002 Reserved for concepts with insufficient information to code with codable children: Secondary | ICD-10-CM

## 2015-10-25 LAB — HEMOGLOBIN A1C: Hgb A1c MFr Bld: 13.3 % — ABNORMAL HIGH (ref 4.6–6.5)

## 2015-10-26 ENCOUNTER — Ambulatory Visit: Payer: PPO | Attending: Primary Care | Admitting: Physical Therapy

## 2015-10-26 DIAGNOSIS — M6281 Muscle weakness (generalized): Secondary | ICD-10-CM | POA: Insufficient documentation

## 2015-10-26 NOTE — Therapy (Signed)
[facility] [address], [place], [address] Phone: [phone]   Fax:  [phone]  Patient Details  Name: Patricia Horne MRN: [id number] Date of Birth: 07/30/1950 Referring Provider:  Pleas Koch, NP  Encounter Date: 10/26/2015  Pt not seen for session per her request.   Fisher,Benjamin PT DPT 10/26/2015, 2:58 PM  [facility] [address] [place], [address] Phone: [phone]   Fax:  [phone]

## 2015-10-29 ENCOUNTER — Encounter: Payer: BC Managed Care – PPO | Admitting: Family Medicine

## 2015-10-29 ENCOUNTER — Encounter: Payer: Self-pay | Admitting: Family Medicine

## 2015-10-29 ENCOUNTER — Ambulatory Visit (INDEPENDENT_AMBULATORY_CARE_PROVIDER_SITE_OTHER): Payer: PPO | Admitting: Family Medicine

## 2015-10-29 VITALS — BP 141/88 | HR 90 | Temp 98.2°F | Ht 61.0 in | Wt 173.2 lb

## 2015-10-29 DIAGNOSIS — K5909 Other constipation: Secondary | ICD-10-CM

## 2015-10-29 DIAGNOSIS — Z23 Encounter for immunization: Secondary | ICD-10-CM

## 2015-10-29 DIAGNOSIS — E1349 Other specified diabetes mellitus with other diabetic neurological complication: Secondary | ICD-10-CM

## 2015-10-29 DIAGNOSIS — I10 Essential (primary) hypertension: Secondary | ICD-10-CM | POA: Diagnosis not present

## 2015-10-29 DIAGNOSIS — E1365 Other specified diabetes mellitus with hyperglycemia: Secondary | ICD-10-CM

## 2015-10-29 DIAGNOSIS — IMO0002 Reserved for concepts with insufficient information to code with codable children: Secondary | ICD-10-CM

## 2015-10-29 MED ORDER — BAYER MICROLET LANCETS MISC: 12 refills | Status: DC

## 2015-10-29 MED ORDER — GLUCOSE BLOOD VI STRP: ORAL_STRIP | 12 refills | Status: DC

## 2015-10-29 MED ORDER — CONTOUR NEXT ONE KIT: 1.0000 | PACK | Freq: Two times a day (BID) | 0 refills | Status: DC
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
## 2015-10-29 NOTE — Progress Notes (Signed)
Pre visit review using our clinic review tool, if applicable. No additional management support is needed unless otherwise documented below in the visit note. 

## 2015-10-29 NOTE — Patient Instructions (Addendum)
Call if constipation is not improving . Increase fiber, water as well.  Stop soda.  Continue amlodipine daily. Start back levemir 50 units daily  In AM  Check sugars every few days at least.  Goal fasting < 120, goal 2 hours after meals < 180

## 2015-11-18 ENCOUNTER — Ambulatory Visit: Payer: PPO | Admitting: Family Medicine

## 2015-11-18 ENCOUNTER — Telehealth: Payer: Self-pay | Admitting: Family Medicine

## 2015-11-18 MED ORDER — FREESTYLE LANCETS MISC: 3 refills | Status: DC

## 2015-11-18 MED ORDER — GLUCOSE BLOOD VI STRP: ORAL_STRIP | 3 refills | Status: DC

## 2015-11-18 MED ORDER — BLOOD GLUCOSE MONITOR KIT: PACK | 0 refills | Status: DC

## 2015-11-18 NOTE — Telephone Encounter (Signed)
Rx for blood glucose meter kit faxed to [facility] [place] at  [phone].

## 2015-11-18 NOTE — Telephone Encounter (Signed)
Pt called because she wants to cancel her appt. Tomorrow and also discuss her diabetes meds with you.  She said that she has been taking the meds as instructed but hasnt been able to check her blood sugar because she is out of the test strips.  She wants to review this with you.  She stated that she wants to get the test strips and then come in next week after she has had a chance to test her blood sugar.  She explained that her visit tomorrow was for shortness of breath and wants to address both issues in one appointment.  Can you please contact her at [phone].  I did not cancel her appointment because I wanted to see what clinical instructions were first.

## 2015-11-18 NOTE — Telephone Encounter (Signed)
Spoke with Patricia Horne.  She states she has been taking her diabetes medicine but has not been able to check blood sugars due to being out of test strips.  Her insurance will not cover test strips for the meter she already has.  She needs new prescriptions [facility] [address] [place] for meter, test strips and lancets.  Sent as requested.  She want to reschedule her appointment until she is able to bring in her sugar reading.  Appointment rescheduled to 11/30/2015 at 8:00 am with Dr. Diona Browner.  When I ask about her SOB she states just every once in a while she finds herself having to take deep breaths.  She states overall she feels good. I advised if her her breathing started becoming an issue before her appointment she will need to be seen.  She states if it continues she will have them do an EKG on her at work.

## 2015-11-19 ENCOUNTER — Ambulatory Visit: Payer: PPO | Admitting: Family Medicine

## 2015-11-30 ENCOUNTER — Ambulatory Visit: Payer: PPO | Admitting: Family Medicine

## 2015-12-02 MED ORDER — FREESTYLE SYSTEM KIT: 1.0000 | PACK | 0 refills | Status: DC | PRN

## 2015-12-02 MED ORDER — GLUCOSE BLOOD VI STRP: ORAL_STRIP | 3 refills | Status: DC

## 2015-12-02 NOTE — Telephone Encounter (Signed)
Spoke with pharmacist at [place] and placed verbal order for Freestyle Glucose Meter, Lancets and test strips and sent it in electronically as well.  Left message for Patricia Horne that this has been done and the pharmacy is getting this ready for her.

## 2015-12-02 NOTE — Telephone Encounter (Signed)
Please send in rx as requested with supplies.

## 2015-12-02 NOTE — Addendum Note (Signed)
Addended by: Carter Kitten on: 12/02/2015 02:44 PM   Modules accepted: Orders

## 2015-12-02 NOTE — Telephone Encounter (Signed)
Patient called and said she went to the pharmacy today to pick up her meter and the pharmacy said the meter wasn't ordered.  Patient is cancelling her appointment with Dr.Bedsole for tomorrow because she needs the meter in order to check her sugar.  Please order meter and patient will reschedule appointment a week after she receives meter.  Patient said her insurance told her she can get the Freestyle meter for free.  Please call patient when meter is ordered at [phone].

## 2015-12-03 ENCOUNTER — Ambulatory Visit: Payer: PPO | Admitting: Family Medicine

## 2015-12-29 ENCOUNTER — Telehealth: Payer: Self-pay | Admitting: Family Medicine

## 2015-12-29 NOTE — Telephone Encounter (Signed)
LVM for pt to call back and schedule AWV + labs with Lesia and CPE with PCP. °

## 2015-12-31 ENCOUNTER — Other Ambulatory Visit: Payer: Self-pay | Admitting: Family Medicine

## 2016-01-05 NOTE — Assessment & Plan Note (Signed)
Pt very noncompliant with emds, using then stopping etc.  Discussed with pt again about need for control of DM.  She will start back levemir 50 units daily  In AM  Check sugars every few days at least.  Goal fasting < 120, goal 2 hours after meals < 180

## 2016-01-05 NOTE — Assessment & Plan Note (Addendum)
Increase fiber and water. Likely due to poor DM control. Offered medicaiton .Marland Kitchen Pt not interested at this time.

## 2016-01-05 NOTE — Assessment & Plan Note (Signed)
Pt currently taking BP meds.. Continue amlodipine.

## 2016-01-20 ENCOUNTER — Other Ambulatory Visit: Payer: Self-pay | Admitting: Family Medicine

## 2016-01-20 DIAGNOSIS — Z1231 Encounter for screening mammogram for malignant neoplasm of breast: Secondary | ICD-10-CM

## 2016-01-21 ENCOUNTER — Encounter: Payer: Self-pay | Admitting: Family Medicine

## 2016-01-21 ENCOUNTER — Ambulatory Visit (INDEPENDENT_AMBULATORY_CARE_PROVIDER_SITE_OTHER): Payer: PPO | Admitting: Family Medicine

## 2016-01-21 DIAGNOSIS — Z9114 Patient's other noncompliance with medication regimen: Secondary | ICD-10-CM | POA: Diagnosis not present

## 2016-01-21 DIAGNOSIS — IMO0002 Reserved for concepts with insufficient information to code with codable children: Secondary | ICD-10-CM

## 2016-01-21 DIAGNOSIS — E1140 Type 2 diabetes mellitus with diabetic neuropathy, unspecified: Secondary | ICD-10-CM

## 2016-01-21 DIAGNOSIS — E13319 Other specified diabetes mellitus with unspecified diabetic retinopathy without macular edema: Secondary | ICD-10-CM

## 2016-01-21 DIAGNOSIS — E1349 Other specified diabetes mellitus with other diabetic neurological complication: Secondary | ICD-10-CM | POA: Diagnosis not present

## 2016-01-21 DIAGNOSIS — E1365 Other specified diabetes mellitus with hyperglycemia: Secondary | ICD-10-CM

## 2016-01-21 DIAGNOSIS — I10 Essential (primary) hypertension: Secondary | ICD-10-CM

## 2016-01-21 NOTE — Assessment & Plan Note (Signed)
Recommended yearly eye exam.

## 2016-01-21 NOTE — Assessment & Plan Note (Signed)
Pt encouraged for her health to be compliant.  Reviewed complications of DM and risk of not getting it under control.

## 2016-01-21 NOTE — Progress Notes (Signed)
Subjective:    Patient ID: Patricia Horne, female    DOB: [birth year], 65 y.o.   MRN: [id number]  HPI    65 year old female  Presents for follow up DM. Pt previously noncompliant with treatment recs.   Cough, new: several weeks off and on. Dy cough, mild congestion, hoarse voice.  No sneeze, no itchy eye.  No SOB. No wheeze. No fever.  Not bothering her a lot.   Hypertension:   Borderline on amlodipine daily BP Readings from Last 3 Encounters:  01/21/16 140/88  10/29/15 (!) 141/88  07/27/15 (!) 171/100  Using medication without problems or lightheadedness: none Chest pain with exertion:none Edema:none Short of breath: none Average home BPs: Other issues:  Diabetes: Poor control.. Pt noncompliant , using glipizide daily, only occ using insulin... She has not been checking her blood sugars.  She did not follow up in 2 weeks as requested at last OV. Lab Results  Component Value Date   HGBA1C 13.3 Repeated and verified X2. (H) 10/25/2015  She feels tired in AMs but feels well as she goes through the day. Feet problems:none, stable mild burning in feet Blood Sugars averaging:? eye exam within last year: no  Eating low carb.. She is still drinking a lot of tea, soda.   Has not joined gym given her schedule.      Review of Systems  Constitutional: Negative for fatigue and fever.  HENT: Negative for ear pain.   Eyes: Negative for pain.  Respiratory: Negative for chest tightness and shortness of breath.   Cardiovascular: Negative for chest pain, palpitations and leg swelling.  Gastrointestinal: Negative for abdominal pain.  Genitourinary: Negative for dysuria.       Objective:   Physical Exam  Constitutional: Vital signs are normal. She appears well-developed and well-nourished. She is cooperative.  Non-toxic appearance. She does not appear ill. No distress.  HENT:  Head: Normocephalic.  Right Ear: Hearing, tympanic membrane, external ear and ear canal normal.  Tympanic membrane is not erythematous, not retracted and not bulging.  Left Ear: Hearing, tympanic membrane, external ear and ear canal normal. Tympanic membrane is not erythematous, not retracted and not bulging.  Nose: No mucosal edema or rhinorrhea. Right sinus exhibits no maxillary sinus tenderness and no frontal sinus tenderness. Left sinus exhibits no maxillary sinus tenderness and no frontal sinus tenderness.  Mouth/Throat: Uvula is midline, oropharynx is clear and moist and mucous membranes are normal.  Eyes: Conjunctivae, EOM and lids are normal. Pupils are equal, round, and reactive to light. Lids are everted and swept, no foreign bodies found.  Neck: Trachea normal and normal range of motion. Neck supple. Carotid bruit is not present. No thyroid mass and no thyromegaly present.  Cardiovascular: Normal rate, regular rhythm, S1 normal, S2 normal, normal heart sounds, intact distal pulses and normal pulses.  Exam reveals no gallop and no friction rub.   No murmur heard. Pulmonary/Chest: Effort normal and breath sounds normal. No tachypnea. No respiratory distress. She has no decreased breath sounds. She has no wheezes. She has no rhonchi. She has no rales.  Abdominal: Soft. Normal appearance and bowel sounds are normal. There is no tenderness.  Neurological: She is alert.  Skin: Skin is warm, dry and intact. No rash noted.  Psychiatric: Her speech is normal and behavior is normal. Judgment and thought content normal. Her mood appears not anxious. Cognition and memory are normal. She does not exhibit a depressed mood.   Diabetic foot exam: Normal inspection  No skin breakdown No calluses  Normal DP pulses Normal sensation to light touch and monofilament Nails normal        Assessment & Plan:

## 2016-01-21 NOTE — Assessment & Plan Note (Signed)
Stable on no med.  Caused directly bu poorly controlled DM.

## 2016-01-21 NOTE — Patient Instructions (Addendum)
Continue glipizide. Stop soda, sweet tea, sweets.  Continue amlodipine daily. Start back levemir 50 units daily  In AM  Check sugars every few days at least.  Goal fasting < 120, goal 2 hours after meals < 180  Keep appt as schedule.  Due for eye exam.

## 2016-01-21 NOTE — Assessment & Plan Note (Signed)
Well controlled. Continue current medication.  

## 2016-01-21 NOTE — Progress Notes (Signed)
Pre visit review using our clinic review tool, if applicable. No additional management support is needed unless otherwise documented below in the visit note. 

## 2016-01-21 NOTE — Assessment & Plan Note (Signed)
Encouraged pt to check sugars , be compliant with meds ( there is no clear reason why she is not, she has not been compliant with any other meds in past. Encouraged exercise, weight loss, healthy eating habits.

## 2016-01-25 ENCOUNTER — Ambulatory Visit
Admission: RE | Admit: 2016-01-25 | Discharge: 2016-01-25 | Disposition: A | Discharge status: Home / Self Care | Payer: PPO | Source: Ambulatory Visit | Attending: Family Medicine | Admitting: Family Medicine

## 2016-01-25 DIAGNOSIS — Z1231 Encounter for screening mammogram for malignant neoplasm of breast: Secondary | ICD-10-CM | POA: Insufficient documentation

## 2016-03-29 ENCOUNTER — Telehealth: Payer: Self-pay | Admitting: Family Medicine

## 2016-03-29 DIAGNOSIS — IMO0002 Reserved for concepts with insufficient information to code with codable children: Secondary | ICD-10-CM

## 2016-03-29 DIAGNOSIS — E1365 Other specified diabetes mellitus with hyperglycemia: Principal | ICD-10-CM

## 2016-03-29 DIAGNOSIS — E1349 Other specified diabetes mellitus with other diabetic neurological complication: Secondary | ICD-10-CM

## 2016-03-29 NOTE — Telephone Encounter (Signed)
-----   Message from Ellamae Sia sent at 03/24/2016  4:19 PM EST ----- Regarding: Lab orders for Thursday, 3.8.18 Patient is scheduled for CPX labs, please order future labs, Thanks , Karna Christmas

## 2016-04-06 ENCOUNTER — Other Ambulatory Visit: Payer: PPO

## 2016-04-11 ENCOUNTER — Other Ambulatory Visit: Payer: PPO

## 2016-04-13 ENCOUNTER — Other Ambulatory Visit (INDEPENDENT_AMBULATORY_CARE_PROVIDER_SITE_OTHER): Payer: PPO

## 2016-04-13 DIAGNOSIS — IMO0002 Reserved for concepts with insufficient information to code with codable children: Secondary | ICD-10-CM

## 2016-04-13 DIAGNOSIS — E1365 Other specified diabetes mellitus with hyperglycemia: Secondary | ICD-10-CM | POA: Diagnosis not present

## 2016-04-13 DIAGNOSIS — E1349 Other specified diabetes mellitus with other diabetic neurological complication: Secondary | ICD-10-CM | POA: Diagnosis not present

## 2016-04-13 LAB — MICROALBUMIN / CREATININE URINE RATIO
Creatinine,U: 31.5 mg/dL
MICROALB UR: 1.9 mg/dL (ref 0.0–1.9)
MICROALB/CREAT RATIO: 6 mg/g (ref 0.0–30.0)

## 2016-04-13 LAB — HEMOGLOBIN A1C: HEMOGLOBIN A1C: 13.3 % — AB (ref 4.6–6.5)

## 2016-04-13 LAB — COMPREHENSIVE METABOLIC PANEL
ALBUMIN: 3.8 g/dL (ref 3.5–5.2)
ALK PHOS: 114 U/L (ref 39–117)
ALT: 14 U/L (ref 0–35)
AST: 14 U/L (ref 0–37)
BILIRUBIN TOTAL: 0.5 mg/dL (ref 0.2–1.2)
BUN: 16 mg/dL (ref 6–23)
CO2: 30 mEq/L (ref 19–32)
Calcium: 9.6 mg/dL (ref 8.4–10.5)
Chloride: 101 mEq/L (ref 96–112)
Creatinine, Ser: 0.74 mg/dL (ref 0.40–1.20)
GFR: 101.1 mL/min (ref >60.00)
Glucose, Bld: 346 mg/dL — ABNORMAL HIGH (ref 70–99)
POTASSIUM: 4.5 meq/L (ref 3.5–5.1)
Sodium: 137 mEq/L (ref 135–145)
TOTAL PROTEIN: 7.5 g/dL (ref 6.0–8.3)

## 2016-04-13 LAB — LIPID PANEL
CHOLESTEROL: 148 mg/dL (ref 0–200)
HDL: 43.2 mg/dL (ref >39.00)
LDL Cholesterol: 80 mg/dL (ref 0–99)
NonHDL: 104.39
TRIGLYCERIDES: 123 mg/dL (ref 0.0–149.0)
Total CHOL/HDL Ratio: 3
VLDL: 24.6 mg/dL (ref 0.0–40.0)

## 2016-04-14 ENCOUNTER — Encounter: Payer: PPO | Admitting: Family Medicine

## 2016-04-18 ENCOUNTER — Encounter: Payer: PPO | Admitting: Family Medicine

## 2016-04-21 ENCOUNTER — Encounter: Payer: PPO | Admitting: Family Medicine

## 2016-05-09 ENCOUNTER — Ambulatory Visit (INDEPENDENT_AMBULATORY_CARE_PROVIDER_SITE_OTHER): Payer: PPO | Admitting: Family Medicine

## 2016-05-09 ENCOUNTER — Encounter: Payer: Self-pay | Admitting: Family Medicine

## 2016-05-09 VITALS — BP 186/98 | HR 100 | Temp 98.1°F | Ht 61.0 in | Wt 172.0 lb

## 2016-05-09 DIAGNOSIS — E1365 Other specified diabetes mellitus with hyperglycemia: Secondary | ICD-10-CM | POA: Diagnosis not present

## 2016-05-09 DIAGNOSIS — E2839 Other primary ovarian failure: Secondary | ICD-10-CM

## 2016-05-09 DIAGNOSIS — J301 Allergic rhinitis due to pollen: Secondary | ICD-10-CM

## 2016-05-09 DIAGNOSIS — I10 Essential (primary) hypertension: Secondary | ICD-10-CM

## 2016-05-09 DIAGNOSIS — Z23 Encounter for immunization: Secondary | ICD-10-CM

## 2016-05-09 DIAGNOSIS — Z9119 Patient's noncompliance with other medical treatment and regimen: Secondary | ICD-10-CM

## 2016-05-09 DIAGNOSIS — E13319 Other specified diabetes mellitus with unspecified diabetic retinopathy without macular edema: Secondary | ICD-10-CM | POA: Diagnosis not present

## 2016-05-09 DIAGNOSIS — IMO0002 Reserved for concepts with insufficient information to code with codable children: Secondary | ICD-10-CM

## 2016-05-09 DIAGNOSIS — E1349 Other specified diabetes mellitus with other diabetic neurological complication: Secondary | ICD-10-CM

## 2016-05-09 DIAGNOSIS — Z91199 Patient's noncompliance with other medical treatment and regimen due to unspecified reason: Secondary | ICD-10-CM

## 2016-05-09 DIAGNOSIS — Z0000 Encounter for general adult medical examination without abnormal findings: Secondary | ICD-10-CM

## 2016-05-09 LAB — HM DIABETES FOOT EXAM

## 2016-05-09 MED ORDER — DULAGLUTIDE 0.75 MG/0.5ML ~~LOC~~ SOAJ: SUBCUTANEOUS | 11 refills | Status: DC

## 2016-05-09 MED ORDER — BENZONATATE 200 MG PO CAPS: 200.0000 mg | ORAL_CAPSULE | Freq: Two times a day (BID) | ORAL | 0 refills | Status: DC | PRN

## 2016-05-09 NOTE — Addendum Note (Signed)
Addended by: Carter Kitten on: 05/09/2016 02:16 PM   Modules accepted: Orders

## 2016-05-09 NOTE — Assessment & Plan Note (Signed)
Start trulicity and pt encouraged to restart insulin. She is hesitant. Counseled on risk of health issues with medication noncompliance and poorly treated BP and diabetes. She voiced understanding.

## 2016-05-09 NOTE — Addendum Note (Signed)
Addended by: Carter Kitten on: 05/09/2016 02:31 PM   Modules accepted: Orders

## 2016-05-09 NOTE — Patient Instructions (Addendum)
Due for annual eye exam. Get back on track with exercise and taking medications.  Start trulicity once weekly. Restart insulin  daily as well if you can. Restart amlodipine daily. Call with blood sugars fasting in AM in 1-2 weeks.  Please stop at the front desk to set up referral.

## 2016-05-09 NOTE — Progress Notes (Signed)
Pre visit review using our clinic review tool, if applicable. No additional management support is needed unless otherwise documented below in the visit note. 

## 2016-05-09 NOTE — Assessment & Plan Note (Signed)
Refilled benzonatate for dry cough prn.

## 2016-05-09 NOTE — Assessment & Plan Note (Signed)
Needs better sugar control. Recommended return to opthamologist.

## 2016-05-09 NOTE — Assessment & Plan Note (Signed)
Poor control off med.Marland Kitchen Restart.
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
## 2016-05-11 ENCOUNTER — Telehealth: Payer: Self-pay

## 2016-05-11 DIAGNOSIS — E119 Type 2 diabetes mellitus without complications: Secondary | ICD-10-CM

## 2016-05-11 NOTE — Telephone Encounter (Signed)
Pt left v/m requesting referral to a doctor at [facility]; left v/m requesting name of physician wanting referral for and what type referral needed.

## 2016-05-11 NOTE — Telephone Encounter (Signed)
Pt called back; pt wants to see Dr Dustin Flock in eye center at [facility] fax # is [phone]. Pt is diabetic and pt is glaucoma suspect. Pt already has appt with Dr Arlis Porta for 06/13/16; pt just needs referral.

## 2016-05-12 NOTE — Telephone Encounter (Signed)
Referral made 

## 2016-05-18 ENCOUNTER — Telehealth: Payer: Self-pay

## 2016-05-18 NOTE — Telephone Encounter (Signed)
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 Pt request cb. Pt seen 05/09/16 but can schedule appt if needed.

## 2016-05-18 NOTE — Telephone Encounter (Signed)
Okay to write note with restrictions on repetitive pushing and pulling motions of bilateral arms due to arthritis pain. ( Note does NOT need to include cyst removal as that is not a reason at this point to limit movement)

## 2016-05-19 ENCOUNTER — Encounter: Payer: Self-pay | Admitting: *Deleted

## 2016-05-19 NOTE — Telephone Encounter (Signed)
Left message for Patricia Horne that her letter for work is ready to be picked up at the front desk.

## 2016-05-26 ENCOUNTER — Telehealth: Payer: Self-pay

## 2016-05-26 DIAGNOSIS — E1349 Other specified diabetes mellitus with other diabetic neurological complication: Secondary | ICD-10-CM

## 2016-05-26 DIAGNOSIS — E1365 Other specified diabetes mellitus with hyperglycemia: Principal | ICD-10-CM

## 2016-05-26 DIAGNOSIS — IMO0002 Reserved for concepts with insufficient information to code with codable children: Secondary | ICD-10-CM

## 2016-05-26 NOTE — Telephone Encounter (Signed)
Pt left v/m requesting referral for pt to go to eye doctor at [facility]; this is out of network and pt request the referral be done thru [employer] ins so ins will approve the referral. Pt request cb.

## 2016-06-08 ENCOUNTER — Telehealth: Payer: Self-pay | Admitting: Family Medicine

## 2016-06-08 NOTE — Telephone Encounter (Signed)
Pt called needing referral auth to [employer]  Paperwork faxed to [facility] To see dr Merdis Delay at [facility] faxed Pt aware

## 2016-06-14 ENCOUNTER — Other Ambulatory Visit: Payer: PPO

## 2016-06-28 ENCOUNTER — Ambulatory Visit: Admission: RE | Admit: 2016-06-28 | Payer: PPO | Source: Ambulatory Visit

## 2016-07-05 ENCOUNTER — Encounter: Payer: Self-pay | Admitting: *Deleted

## 2016-07-05 ENCOUNTER — Ambulatory Visit
Admission: RE | Admit: 2016-07-05 | Discharge: 2016-07-05 | Disposition: A | Discharge status: Home / Self Care | Payer: PPO | Source: Ambulatory Visit | Attending: Family Medicine | Admitting: Family Medicine

## 2016-07-05 DIAGNOSIS — E119 Type 2 diabetes mellitus without complications: Secondary | ICD-10-CM | POA: Insufficient documentation

## 2016-07-05 DIAGNOSIS — E2839 Other primary ovarian failure: Secondary | ICD-10-CM | POA: Diagnosis not present

## 2016-07-05 DIAGNOSIS — Z13820 Encounter for screening for osteoporosis: Secondary | ICD-10-CM | POA: Diagnosis not present

## 2016-07-07 ENCOUNTER — Encounter: Payer: Self-pay | Admitting: *Deleted

## 2016-08-04 ENCOUNTER — Other Ambulatory Visit: Payer: PPO

## 2016-08-04 ENCOUNTER — Telehealth: Payer: Self-pay | Admitting: Family Medicine

## 2016-08-04 DIAGNOSIS — IMO0002 Reserved for concepts with insufficient information to code with codable children: Secondary | ICD-10-CM

## 2016-08-04 DIAGNOSIS — E1365 Other specified diabetes mellitus with hyperglycemia: Principal | ICD-10-CM

## 2016-08-04 DIAGNOSIS — E1349 Other specified diabetes mellitus with other diabetic neurological complication: Secondary | ICD-10-CM

## 2016-08-04 NOTE — Telephone Encounter (Signed)
-----   Message from Ellamae Sia sent at 07/26/2016  4:08 PM EDT ----- Regarding: Lab orders for Friday, 7.6.18 Lab orders for f/u appt

## 2016-08-07 ENCOUNTER — Other Ambulatory Visit (INDEPENDENT_AMBULATORY_CARE_PROVIDER_SITE_OTHER): Payer: PPO

## 2016-08-07 DIAGNOSIS — E1365 Other specified diabetes mellitus with hyperglycemia: Secondary | ICD-10-CM

## 2016-08-07 DIAGNOSIS — IMO0002 Reserved for concepts with insufficient information to code with codable children: Secondary | ICD-10-CM

## 2016-08-07 DIAGNOSIS — E1349 Other specified diabetes mellitus with other diabetic neurological complication: Secondary | ICD-10-CM

## 2016-08-07 LAB — HEMOGLOBIN A1C: HEMOGLOBIN A1C: 14.2 % — AB (ref 4.6–6.5)

## 2016-08-08 ENCOUNTER — Encounter: Payer: Self-pay | Admitting: Family Medicine

## 2016-08-08 ENCOUNTER — Ambulatory Visit (INDEPENDENT_AMBULATORY_CARE_PROVIDER_SITE_OTHER): Payer: PPO | Admitting: Family Medicine

## 2016-08-08 VITALS — BP 171/103 | HR 89 | Temp 97.9°F | Ht 61.0 in | Wt 169.0 lb

## 2016-08-08 DIAGNOSIS — I10 Essential (primary) hypertension: Secondary | ICD-10-CM | POA: Diagnosis not present

## 2016-08-08 DIAGNOSIS — E1349 Other specified diabetes mellitus with other diabetic neurological complication: Secondary | ICD-10-CM | POA: Diagnosis not present

## 2016-08-08 DIAGNOSIS — E1365 Other specified diabetes mellitus with hyperglycemia: Secondary | ICD-10-CM

## 2016-08-08 DIAGNOSIS — E1140 Type 2 diabetes mellitus with diabetic neuropathy, unspecified: Secondary | ICD-10-CM | POA: Diagnosis not present

## 2016-08-08 DIAGNOSIS — IMO0002 Reserved for concepts with insufficient information to code with codable children: Secondary | ICD-10-CM

## 2016-08-08 DIAGNOSIS — Z91199 Patient's noncompliance with other medical treatment and regimen due to unspecified reason: Secondary | ICD-10-CM

## 2016-08-08 DIAGNOSIS — Z9119 Patient's noncompliance with other medical treatment and regimen: Secondary | ICD-10-CM

## 2016-08-08 LAB — HM DIABETES FOOT EXAM

## 2016-08-08 MED ORDER — GLIPIZIDE ER 10 MG PO TB24: 10.0000 mg | ORAL_TABLET | Freq: Every day | ORAL | 11 refills | Status: DC

## 2016-08-08 NOTE — Progress Notes (Signed)
   Subjective:    Patient ID: Patricia Horne, female    DOB: October 21, 1950, 66 y.o.   MRN: [id number]  HPI    66 year old female with poorly controlled DM, HTN who continues to be noncompliant with medication both for HTN and DM.  Diabetes:   She is noncompliant with meds and with diet and exercise. She has been noncompliant for years.  She was referred to ENDO in past as well but was noncompliant there as well.   She did not feel comfortable with Dr. Cruzita Lederer ENDO and does not want to return.  She is retired.  She just does not like to take medication period injection or not.  She just has not taken any med in last 3 months. Metformin takes bad taste in mouth, glucotrol does not give SE.  Trulicity too expensive.  She eats irregularly, moderate carb. Sandwiches, she does drink sweet drinks.  She is interested in freestyle [place] prescription. Lab Results  Component Value Date   HGBA1C 14.2 (H) 08/07/2016  Using medications without difficulties: Hypoglycemic episodes:? Hyperglycemic episodes:? Feet problems:  No ulcers Blood Sugars averaging: not checking eye exam within last year: due for eye exam.  Hypertension:    Poor control.. Not taking medication BP Readings from Last 3 Encounters:  08/08/16 (!) 171/103  05/09/16 (!) 186/98  01/21/16 140/88  Using medication without problems or lightheadedness:  none Chest pain with exertion: none Edema:none Short of breath: none Average home BPs: Other issues:  Wt Readings from Last 3 Encounters:  08/08/16 169 lb (76.7 kg)  05/09/16 172 lb (78 kg)  01/21/16 173 lb 8 oz (78.7 kg)    Review of Systems     Objective:   Physical Exam        Assessment & Plan:

## 2016-08-08 NOTE — Patient Instructions (Addendum)
Make sure to set up yearly eye exam.  Take blood pressure amlodipine as soon as you get home.  Stop soda and sweet drinks, work on low carb diet.  Start back glipizide,  TAKE insulin daily.   If can afford  trulicity start.  Check sugar at least every few days.. Goal fasting < 120  Increase water in take.   Please stop at the front desk to set up referral to ENDO for discussion of insulin pump and freestyle libre.
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
## 2016-09-11 ENCOUNTER — Telehealth: Payer: Self-pay | Admitting: *Deleted

## 2016-09-11 NOTE — Telephone Encounter (Signed)
Patient left a voicemail stating she can not get in to see an endocrinologist until October. Patient is requesting that you send to the pharmacy a script for the new Libre meter to check her blood sugar on her arm.

## 2016-09-12 NOTE — Telephone Encounter (Signed)
I do not prescribe this meter.. She needs to see an ENDO to set up.

## 2016-09-12 NOTE — Telephone Encounter (Signed)
Attempted to reach patient, no answer left vm to call back

## 2016-09-14 NOTE — Telephone Encounter (Signed)
lmomtcb x 2  

## 2016-09-15 NOTE — Telephone Encounter (Signed)
lmomtcb x 3  

## 2016-09-18 NOTE — Telephone Encounter (Signed)
Patricia Horne notified by telephone that the Rx for the [facility] has to come from the endocrinologist per Dr. Diona Browner.

## 2016-09-25 DIAGNOSIS — E1165 Type 2 diabetes mellitus with hyperglycemia: Secondary | ICD-10-CM | POA: Diagnosis not present

## 2016-09-25 DIAGNOSIS — I10 Essential (primary) hypertension: Secondary | ICD-10-CM | POA: Diagnosis not present

## 2016-09-26 DIAGNOSIS — E1165 Type 2 diabetes mellitus with hyperglycemia: Secondary | ICD-10-CM | POA: Diagnosis not present

## 2016-09-26 DIAGNOSIS — I158 Other secondary hypertension: Secondary | ICD-10-CM | POA: Diagnosis not present

## 2016-10-04 DIAGNOSIS — E1165 Type 2 diabetes mellitus with hyperglycemia: Secondary | ICD-10-CM | POA: Diagnosis not present

## 2016-11-08 ENCOUNTER — Encounter: Payer: Self-pay | Admitting: Physician Assistant

## 2016-11-08 ENCOUNTER — Ambulatory Visit: Payer: Self-pay | Admitting: Physician Assistant

## 2016-11-08 VITALS — BP 142/100 | HR 87 | Temp 98.6°F

## 2016-11-08 DIAGNOSIS — M25511 Pain in right shoulder: Secondary | ICD-10-CM

## 2016-11-08 MED ORDER — MELOXICAM 15 MG PO TABS: 15.0000 mg | ORAL_TABLET | Freq: Every day | ORAL | 0 refills | Status: DC

## 2016-11-08 MED ORDER — BACLOFEN 10 MG PO TABS: 10.0000 mg | ORAL_TABLET | Freq: Three times a day (TID) | ORAL | 0 refills | Status: DC

## 2016-11-08 NOTE — Progress Notes (Signed)
S: c/o r shoulder pain, fell over a month ago at home, was ok then shoulder started hurting again a few days ago, aches from r shoulder to forearm and fingers, no cp/sob, no new injury, no numbness or tingling, having difficulty raising her arm over her head  O: vitals wnl, nad, skin without bruising, r shoulder neg for bony tenderness, tender at bursa, decreased rom with overhead reach, good internal rotation, grips = b/l, n/v intact  A: r shoulder pain, bursitis  P: mobic 15mg  qd, baclofen, if not better in 1-2 weeks will refer to ortho

## 2016-11-10 ENCOUNTER — Ambulatory Visit: Payer: PPO | Admitting: Family Medicine

## 2017-01-19 ENCOUNTER — Other Ambulatory Visit: Payer: Self-pay | Admitting: Family Medicine

## 2017-01-19 DIAGNOSIS — Z1231 Encounter for screening mammogram for malignant neoplasm of breast: Secondary | ICD-10-CM

## 2017-02-13 ENCOUNTER — Ambulatory Visit: Payer: Self-pay | Admitting: Family Medicine

## 2017-02-14 ENCOUNTER — Ambulatory Visit
Admission: RE | Admit: 2017-02-14 | Discharge: 2017-02-14 | Disposition: A | Discharge status: Home / Self Care | Payer: PPO | Source: Ambulatory Visit | Attending: Family Medicine | Admitting: Family Medicine

## 2017-02-14 DIAGNOSIS — Z1231 Encounter for screening mammogram for malignant neoplasm of breast: Secondary | ICD-10-CM | POA: Diagnosis not present

## 2017-02-20 ENCOUNTER — Telehealth: Payer: Self-pay

## 2017-02-20 ENCOUNTER — Encounter: Payer: Self-pay | Admitting: Family Medicine

## 2017-02-20 ENCOUNTER — Other Ambulatory Visit: Payer: Self-pay

## 2017-02-20 ENCOUNTER — Ambulatory Visit (INDEPENDENT_AMBULATORY_CARE_PROVIDER_SITE_OTHER): Payer: PPO | Admitting: Family Medicine

## 2017-02-20 VITALS — BP 140/86 | HR 99 | Temp 97.8°F | Ht 61.0 in | Wt 172.5 lb

## 2017-02-20 DIAGNOSIS — IMO0002 Reserved for concepts with insufficient information to code with codable children: Secondary | ICD-10-CM

## 2017-02-20 DIAGNOSIS — Z9114 Patient's other noncompliance with medication regimen: Secondary | ICD-10-CM | POA: Diagnosis not present

## 2017-02-20 DIAGNOSIS — E1365 Other specified diabetes mellitus with hyperglycemia: Secondary | ICD-10-CM | POA: Diagnosis not present

## 2017-02-20 DIAGNOSIS — E1349 Other specified diabetes mellitus with other diabetic neurological complication: Secondary | ICD-10-CM

## 2017-02-20 DIAGNOSIS — I10 Essential (primary) hypertension: Secondary | ICD-10-CM | POA: Diagnosis not present

## 2017-02-20 DIAGNOSIS — E1140 Type 2 diabetes mellitus with diabetic neuropathy, unspecified: Secondary | ICD-10-CM | POA: Diagnosis not present

## 2017-02-20 DIAGNOSIS — Z6832 Body mass index (BMI) 32.0-32.9, adult: Secondary | ICD-10-CM | POA: Diagnosis not present

## 2017-02-20 DIAGNOSIS — E13319 Other specified diabetes mellitus with unspecified diabetic retinopathy without macular edema: Secondary | ICD-10-CM

## 2017-02-20 LAB — HM DIABETES FOOT EXAM

## 2017-02-20 NOTE — Assessment & Plan Note (Signed)
Encouraged her to take her medication.

## 2017-02-20 NOTE — Assessment & Plan Note (Signed)
We have discussed many times that I cannot help her if she does not take the medication or check her blood sugars. She wishes to be referred to a different ENDO that her insurance covers. I have told her that  They will prescribe medication and she need to be open to taking it. 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 [email or web address] more serious complication of noncompliance.. She WILL have these if she continues on this path.   Aggressive lifestlye changes.. Especially diet modification reviewed again at this OV.

## 2017-02-20 NOTE — Patient Instructions (Signed)
Please stop at the front desk to set up referral.  

## 2017-02-20 NOTE — Assessment & Plan Note (Signed)
Persistent.. No clear reason.. Pt with many excuses but when pushes comes down to I just don't like medication.

## 2017-02-20 NOTE — Telephone Encounter (Signed)
Copied from [place]. Topic: Referral - Status >> Feb 20, 2017  4:01 PM Bea Graff, NT wrote: Reason for CRM: Pt calling the office to schedule with an endocrinologist apt. She states office called her, no CRM in chart where pt was contacted. Please advise.

## 2017-02-20 NOTE — Progress Notes (Signed)
Subjective:    Patient ID: Patricia Horne, female    DOB: [birth year], 67 y.o.   MRN: [id number]  HPI   67 year old female noncompliant with diabetes treatment presents for DM follow up.   "I feel fine overall"  " I am not taking any meds, I don't know why"  She had seen Dr. Rosario Jacks ENDO in 09/2016. Insurance did not pay the bill.  He recommended Januvia.. She never took it. She tried freestyle meter but she cannot get it to stay on her arm.   She plans to get set up with ENDO at [facility].  She had  Accidental fall in 09/2016 as well.. Had to repair 2 teeth.. Was very expensive.  She has been under a lot of stress.. But pushing through work.  Diabetes:   Due for re-eval. Stop all meds.. Insulin, She does not eat much but eats biscuits and soda all the time. Lab Results  Component Value Date   HGBA1C 14.2 (H) 08/07/2016  Using medications without difficulties: Hypoglycemic episodes:? Hyperglycemic episodes:? Was having 200s daily when wearing freestyle meter Feet problems:no ulcers.. burning in feet bothersome, but not interested in a med to treat. Blood Sugars averaging: not checking lately eye exam within last year: recommended but pt has been noncompliant  Hypertension:    Tolerable control today despite not taking her amlodipine  BP Readings from Last 3 Encounters:  02/20/17 140/86  11/08/16 (!) 142/100  08/08/16 (!) 171/103  Using medication without problems or lightheadedness:  Chest pain with exertion: none Edema: none Short of breath: none Average home BPs: Other issues:  Wt Readings from Last 3 Encounters:  02/20/17 172 lb 8 oz (78.2 kg)  08/08/16 169 lb (76.7 kg)  05/09/16 172 lb (78 kg)     Blood pressure 140/86, pulse 99, temperature 97.8 F (36.6 C), temperature source Oral, height 5\' 1"  (1.549 m), weight 172 lb 8 oz (78.2 kg).  Review of Systems  Constitutional: Negative for fatigue and fever.  HENT: Negative for congestion.   Eyes: Negative for  pain.  Respiratory: Negative for cough and shortness of breath.   Cardiovascular: Negative for chest pain, palpitations and leg swelling.  Gastrointestinal: Negative for abdominal pain.  Genitourinary: Negative for dysuria and vaginal bleeding.  Musculoskeletal: Positive for arthralgias. Negative for back pain.       Right shoulder pain  Neurological: Negative for syncope, light-headedness and headaches.  Psychiatric/Behavioral: Negative for dysphoric mood.       Objective:   Physical Exam  Constitutional: Vital signs are normal. She appears well-developed and well-nourished. She is cooperative.  Non-toxic appearance. She does not appear ill. No distress.  HENT:  Head: Normocephalic.  Right Ear: Hearing, tympanic membrane, external ear and ear canal normal. Tympanic membrane is not erythematous, not retracted and not bulging.  Left Ear: Hearing, tympanic membrane, external ear and ear canal normal. Tympanic membrane is not erythematous, not retracted and not bulging.  Nose: No mucosal edema or rhinorrhea. Right sinus exhibits no maxillary sinus tenderness and no frontal sinus tenderness. Left sinus exhibits no maxillary sinus tenderness and no frontal sinus tenderness.  Mouth/Throat: Uvula is midline, oropharynx is clear and moist and mucous membranes are normal.  Eyes: Conjunctivae, EOM and lids are normal. Pupils are equal, round, and reactive to light. Lids are everted and swept, no foreign bodies found.  Neck: Trachea normal and normal range of motion. Neck supple. Carotid bruit is not present. No thyroid mass and no thyromegaly present.  Cardiovascular: Normal rate, regular rhythm, S1 normal, S2 normal, normal heart sounds, intact distal pulses and normal pulses. Exam reveals no gallop and no friction rub.  No murmur heard. Pulmonary/Chest: Effort normal and breath sounds normal. No tachypnea. No respiratory distress. She has no decreased breath sounds. She has no wheezes. She has no  rhonchi. She has no rales.  Abdominal: Soft. Normal appearance and bowel sounds are normal. There is no tenderness.  Neurological: She is alert.  Skin: Skin is warm, dry and intact. No rash noted.  Psychiatric: Her speech is normal and behavior is normal. Judgment and thought content normal. Her mood appears not anxious. Cognition and memory are normal. She does not exhibit a depressed mood.     Diabetic foot exam: Normal inspection No skin breakdown No calluses  Normal DP pulses decreased sensation to light touch and monofilament Nails normal      Assessment & Plan:

## 2017-02-21 NOTE — Telephone Encounter (Signed)
Called the patient and had to [facility] that her Endocrinology REferral was faxed to [facility] for her to see Dr Judithann Sheen. They will call her directly to set this up.

## 2017-02-26 ENCOUNTER — Other Ambulatory Visit (INDEPENDENT_AMBULATORY_CARE_PROVIDER_SITE_OTHER): Payer: PPO

## 2017-02-26 DIAGNOSIS — E1349 Other specified diabetes mellitus with other diabetic neurological complication: Secondary | ICD-10-CM

## 2017-02-26 DIAGNOSIS — E1365 Other specified diabetes mellitus with hyperglycemia: Secondary | ICD-10-CM

## 2017-02-26 DIAGNOSIS — IMO0002 Reserved for concepts with insufficient information to code with codable children: Secondary | ICD-10-CM

## 2017-02-26 LAB — COMPREHENSIVE METABOLIC PANEL
ALBUMIN: 3.5 g/dL (ref 3.5–5.2)
ALT: 12 U/L (ref 0–35)
AST: 13 U/L (ref 0–37)
Alkaline Phosphatase: 87 U/L (ref 39–117)
BUN: 15 mg/dL (ref 6–23)
CALCIUM: 9.4 mg/dL (ref 8.4–10.5)
CHLORIDE: 103 meq/L (ref 96–112)
CO2: 29 mEq/L (ref 19–32)
Creatinine, Ser: 0.78 mg/dL (ref 0.40–1.20)
GFR: 94.88 mL/min (ref >60.00)
Glucose, Bld: 305 mg/dL — ABNORMAL HIGH (ref 70–99)
Potassium: 3.8 mEq/L (ref 3.5–5.1)
SODIUM: 140 meq/L (ref 135–145)
Total Bilirubin: 0.9 mg/dL (ref 0.2–1.2)
Total Protein: 7.2 g/dL (ref 6.0–8.3)

## 2017-02-26 LAB — LIPID PANEL
CHOL/HDL RATIO: 4
Cholesterol: 154 mg/dL (ref 0–200)
HDL: 41.7 mg/dL (ref >39.00)
LDL Cholesterol: 75 mg/dL (ref 0–99)
NonHDL: 112.31
TRIGLYCERIDES: 186 mg/dL — AB (ref 0.0–149.0)
VLDL: 37.2 mg/dL (ref 0.0–40.0)

## 2017-02-26 LAB — HEMOGLOBIN A1C: HEMOGLOBIN A1C: 13.2 % — AB (ref 4.6–6.5)

## 2017-02-27 ENCOUNTER — Encounter: Payer: Self-pay | Admitting: *Deleted

## 2017-03-20 ENCOUNTER — Ambulatory Visit: Payer: PPO | Admitting: Family Medicine

## 2017-03-21 ENCOUNTER — Ambulatory Visit (INDEPENDENT_AMBULATORY_CARE_PROVIDER_SITE_OTHER): Payer: PPO | Admitting: Family Medicine

## 2017-03-21 ENCOUNTER — Other Ambulatory Visit: Payer: Self-pay

## 2017-03-21 ENCOUNTER — Encounter: Payer: Self-pay | Admitting: Family Medicine

## 2017-03-21 VITALS — BP 140/92 | HR 90 | Temp 98.5°F | Ht 61.0 in | Wt 171.0 lb

## 2017-03-21 DIAGNOSIS — E1349 Other specified diabetes mellitus with other diabetic neurological complication: Secondary | ICD-10-CM

## 2017-03-21 DIAGNOSIS — M7501 Adhesive capsulitis of right shoulder: Secondary | ICD-10-CM | POA: Diagnosis not present

## 2017-03-21 DIAGNOSIS — E1365 Other specified diabetes mellitus with hyperglycemia: Secondary | ICD-10-CM | POA: Diagnosis not present

## 2017-03-21 DIAGNOSIS — Z9114 Patient's other noncompliance with medication regimen: Secondary | ICD-10-CM

## 2017-03-21 DIAGNOSIS — E11618 Type 2 diabetes mellitus with other diabetic arthropathy: Secondary | ICD-10-CM | POA: Diagnosis not present

## 2017-03-21 DIAGNOSIS — IMO0002 Reserved for concepts with insufficient information to code with codable children: Secondary | ICD-10-CM

## 2017-03-21 MED ORDER — TRAMADOL HCL 50 MG PO TABS: 50.0000 mg | ORAL_TABLET | Freq: Four times a day (QID) | ORAL | 0 refills | Status: AC | PRN

## 2017-03-21 NOTE — Patient Instructions (Signed)
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
## 2017-04-02 ENCOUNTER — Ambulatory Visit: Payer: PPO | Attending: Family Medicine | Admitting: Physical Therapy

## 2017-04-02 ENCOUNTER — Encounter: Payer: Self-pay | Admitting: Physical Therapy

## 2017-04-02 DIAGNOSIS — M7501 Adhesive capsulitis of right shoulder: Secondary | ICD-10-CM | POA: Insufficient documentation

## 2017-04-02 DIAGNOSIS — M6281 Muscle weakness (generalized): Secondary | ICD-10-CM | POA: Insufficient documentation

## 2017-04-02 NOTE — Patient Instructions (Signed)
Patient was given AAROM IR with cane with photo taken on her phone at the same parameters as exercises listed below:
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
## 2017-04-05 ENCOUNTER — Ambulatory Visit: Payer: PPO | Admitting: Physical Therapy

## 2017-04-05 ENCOUNTER — Encounter: Payer: PPO | Admitting: Physical Therapy

## 2017-04-09 ENCOUNTER — Ambulatory Visit: Payer: PPO | Admitting: Physical Therapy

## 2017-04-09 ENCOUNTER — Encounter: Payer: Self-pay | Admitting: Physical Therapy

## 2017-04-09 DIAGNOSIS — M7501 Adhesive capsulitis of right shoulder: Secondary | ICD-10-CM | POA: Diagnosis not present
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
## 2017-04-11 ENCOUNTER — Ambulatory Visit: Payer: PPO

## 2017-04-11 DIAGNOSIS — M7501 Adhesive capsulitis of right shoulder: Secondary | ICD-10-CM | POA: Diagnosis not present

## 2017-04-11 DIAGNOSIS — M6281 Muscle weakness (generalized): Secondary | ICD-10-CM
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
## 2017-04-16 ENCOUNTER — Ambulatory Visit: Payer: PPO | Admitting: Physical Therapy

## 2017-04-16 ENCOUNTER — Encounter: Payer: Self-pay | Admitting: Family Medicine

## 2017-04-16 ENCOUNTER — Ambulatory Visit (INDEPENDENT_AMBULATORY_CARE_PROVIDER_SITE_OTHER): Payer: PPO | Admitting: Family Medicine

## 2017-04-16 VITALS — BP 154/90 | HR 95 | Temp 97.9°F | Wt 165.0 lb

## 2017-04-16 DIAGNOSIS — J019 Acute sinusitis, unspecified: Secondary | ICD-10-CM | POA: Insufficient documentation

## 2017-04-16 DIAGNOSIS — E1365 Other specified diabetes mellitus with hyperglycemia: Secondary | ICD-10-CM

## 2017-04-16 DIAGNOSIS — I10 Essential (primary) hypertension: Secondary | ICD-10-CM

## 2017-04-16 DIAGNOSIS — H6121 Impacted cerumen, right ear: Secondary | ICD-10-CM | POA: Diagnosis not present

## 2017-04-16 DIAGNOSIS — IMO0002 Reserved for concepts with insufficient information to code with codable children: Secondary | ICD-10-CM

## 2017-04-16 DIAGNOSIS — E1349 Other specified diabetes mellitus with other diabetic neurological complication: Secondary | ICD-10-CM | POA: Diagnosis not present

## 2017-04-16 MED ORDER — BENZONATATE 100 MG PO CAPS: 100.0000 mg | ORAL_CAPSULE | Freq: Three times a day (TID) | ORAL | 0 refills | Status: DC | PRN

## 2017-04-16 MED ORDER — AMOXICILLIN-POT CLAVULANATE 875-125 MG PO TABS: 1.0000 | ORAL_TABLET | Freq: Two times a day (BID) | ORAL | 0 refills | Status: AC

## 2017-04-16 NOTE — Assessment & Plan Note (Signed)
R side - affecting hearing. Irrigation performed today.

## 2017-04-16 NOTE — Progress Notes (Signed)
BP (!) 154/90 (BP Location: Right Arm, Patient Position: Sitting, Cuff Size: Normal)   Pulse 95   Temp 97.9 F (36.6 C) (Oral)   Wt 165 lb (74.8 kg)   SpO2 99%   BMI 31.18 kg/m   On repeat 154/86  CC: sinus congestion Subjective:    Patient ID: Patricia Horne, female    DOB: November 18, 1950, 67 y.o.   MRN: [id number]  HPI: Patricia Horne is a 67 y.o. female presenting on 04/16/2017 for Sinus Problem (Sinus congestion, drainage and cough with clear mucous started about 2 wks ago.  Taken Tylenol.)   7-8d h/o sinus congestion, muffled hearing and ear congestion as well as low grade fever. Nonproductive cough. Sleeping ok. Initial improvement, but over last few days progressive worsening. Mild HA with rhinorrhea and coryza and sinus pressure.   No ST, ear or tooth pain, PNdrainage. No dyspnea or wheezing.  Hasn't tried anything for this yet except for sudafed. Tessalon perls and tussionex have helped in the past (this has sugar in it).   Works nights.  No sick contacts at home.  Non smoker.  No h/o asthma.   Known diabetes - followed by endo.  Lab Results  Component Value Date   HGBA1C 13.2 (H) 02/26/2017    Lab Results  Component Value Date   CREATININE 0.78 02/26/2017   BUN 15 02/26/2017   NA 140 02/26/2017   K 3.8 02/26/2017   CL 103 02/26/2017   CO2 29 02/26/2017     Relevant past medical, surgical, family and social history reviewed and updated as indicated. Interim medical history since our last visit reviewed. Allergies and medications reviewed and updated. Outpatient Medications Prior to Visit  Medication Sig Dispense Refill  . amLODipine (NORVASC) 10 MG tablet TAKE 1 TABLET (10 MG TOTAL) BY MOUTH DAILY. (Patient not taking: Reported on 05/09/2016) 90 tablet 1  . baclofen (LIORESAL) 10 MG tablet Take 1 tablet (10 mg total) by mouth 3 (three) times daily. (Patient not taking: Reported on 02/20/2017) 30 each 0  . glipiZIDE (GLUCOTROL XL) 10 MG 24 hr tablet Take 1  tablet (10 mg total) by mouth daily with breakfast. (Patient not taking: Reported on 02/20/2017) 30 tablet 11  . glucose blood (FREESTYLE TEST STRIPS) test strip Use to check blood sugar daily. (Patient not taking: Reported on 02/20/2017) 100 each 3  . glucose monitoring kit (FREESTYLE) monitoring kit 1 each by Does not apply route as needed for other. (Patient not taking: Reported on 02/20/2017) 1 each 0  . Insulin Detemir (LEVEMIR) 100 UNIT/ML Pen Inject 50 Units into the skin daily. (Patient not taking: Reported on 05/09/2016) 15 mL 11  . irbesartan (AVAPRO) 150 MG tablet     . Lancets (FREESTYLE) lancets Use to check blood sugar daily.  Dx. E13.49, E13.65 (Patient not taking: Reported on 02/20/2017) 100 each 3  . meloxicam (MOBIC) 15 MG tablet Take 1 tablet (15 mg total) by mouth daily. (Patient not taking: Reported on 02/20/2017) 30 tablet 0   No facility-administered medications prior to visit.      Per HPI unless specifically indicated in ROS section below Review of Systems     Objective:    BP (!) 154/90 (BP Location: Right Arm, Patient Position: Sitting, Cuff Size: Normal)   Pulse 95   Temp 97.9 F (36.6 C) (Oral)   Wt 165 lb (74.8 kg)   SpO2 99%   BMI 31.18 kg/m   Wt Readings from Last 3 Encounters:  04/16/17 165 lb (74.8 kg)  03/21/17 171 lb (77.6 kg)  02/20/17 172 lb 8 oz (78.2 kg)    Physical Exam  Constitutional: She appears well-developed and well-nourished. No distress.  HENT:  Head: Normocephalic and atraumatic.  Right Ear: Hearing, external ear and ear canal normal.  Left Ear: Hearing, external ear and ear canal normal.  Nose: Mucosal edema and rhinorrhea present. Right sinus exhibits frontal sinus tenderness. Right sinus exhibits no maxillary sinus tenderness. Left sinus exhibits frontal sinus tenderness. Left sinus exhibits no maxillary sinus tenderness.  Mouth/Throat: Uvula is midline, oropharynx is clear and moist and mucous membranes are normal. No oropharyngeal  exudate, posterior oropharyngeal edema, posterior oropharyngeal erythema or tonsillar abscesses.  Congestion behind L TM R TM covered by cerumen - irrigation performed  Eyes: Conjunctivae and EOM are normal. Pupils are equal, round, and reactive to light. No scleral icterus.  Neck: Normal range of motion. Neck supple.  Cardiovascular: Normal rate, regular rhythm, normal heart sounds and intact distal pulses.  No murmur heard. Pulmonary/Chest: Effort normal and breath sounds normal. No respiratory distress. She has no wheezes. She has no rales.  Lymphadenopathy:    She has no cervical adenopathy.  Skin: Skin is warm and dry. No rash noted.  Nursing note and vitals reviewed.  R ear irrigation performed: pt tolerated well    Assessment & Plan:   Problem List Items Addressed This Visit    Acute sinusitis - Primary    Of 8 d duration, discussed possibly still viral. Supportive care reviewed, with mucinex, fluids, rest, nasal saline, and ibuprofen. WASP for augmentin provided today with indications when to fill. Pt agrees with plan       Relevant Medications   benzonatate (TESSALON) 100 MG capsule   amoxicillin-clavulanate (AUGMENTIN) 875-125 MG tablet   Cerumen impaction    R side - affecting hearing. Irrigation performed today.       DM (diabetes mellitus), secondary, uncontrolled, with neurologic complications (HCC)    Chronic poor control. Pending new endo eval.       Essential hypertension, benign    Deteriorated - she had been taking decongestants for her current illness. Advised stop this.           Meds ordered this encounter  Medications  . benzonatate (TESSALON) 100 MG capsule    Sig: Take 1 capsule (100 mg total) by mouth 3 (three) times daily as needed for cough.    Dispense:  30 capsule    Refill:  0  . amoxicillin-clavulanate (AUGMENTIN) 875-125 MG tablet    Sig: Take 1 tablet by mouth 2 (two) times daily for 10 days.    Dispense:  20 tablet    Refill:  0    No orders of the defined types were placed in this encounter.   Follow up plan: Return if symptoms worsen or fail to improve.  Patricia Bush, MD

## 2017-04-16 NOTE — Assessment & Plan Note (Signed)
Chronic poor control. Pending new endo eval.

## 2017-04-16 NOTE — Assessment & Plan Note (Addendum)
Deteriorated - she had been taking decongestants for her current illness. Advised stop this.

## 2017-04-16 NOTE — Patient Instructions (Signed)
You have a sinus infection, possibly viral still. Treat with ibuprofen 400mg  with meals for 3 days.  Nasal saline irrigation or neti pot to help drain sinuses. May use plain mucinex with plenty of fluid to help mobilize mucous. Push fluids and plenty of rest. Prescription for augmentin antibiotic printed out today - fill if any worsening, or if symptoms persist past 10 days of illness.  Please let us know if fever >101.5, trouble opening/closing mouth, difficulty swallowing, or worsening instead of improving as expected.

## 2017-04-16 NOTE — Assessment & Plan Note (Signed)
Of 8 d duration, discussed possibly still viral. Supportive care reviewed, with mucinex, fluids, rest, nasal saline, and ibuprofen. WASP for augmentin provided today with indications when to fill. Pt agrees with plan

## 2017-04-20 ENCOUNTER — Ambulatory Visit: Payer: PPO | Admitting: Physical Therapy

## 2017-04-20 ENCOUNTER — Encounter: Payer: Self-pay | Admitting: Physical Therapy

## 2017-04-20 DIAGNOSIS — M7501 Adhesive capsulitis of right shoulder: Secondary | ICD-10-CM
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
## 2017-04-30 ENCOUNTER — Ambulatory Visit: Payer: PPO | Admitting: Physical Therapy

## 2017-04-30 DIAGNOSIS — E113313 Type 2 diabetes mellitus with moderate nonproliferative diabetic retinopathy with macular edema, bilateral: Secondary | ICD-10-CM | POA: Diagnosis not present

## 2017-04-30 LAB — HM DIABETES EYE EXAM

## 2017-05-01 ENCOUNTER — Ambulatory Visit: Payer: PPO | Attending: Family Medicine | Admitting: Physical Therapy

## 2017-05-01 ENCOUNTER — Encounter: Payer: Self-pay | Admitting: Physical Therapy

## 2017-05-01 DIAGNOSIS — M7501 Adhesive capsulitis of right shoulder: Secondary | ICD-10-CM | POA: Insufficient documentation
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
## 2017-05-03 ENCOUNTER — Ambulatory Visit: Payer: PPO | Admitting: Physical Therapy

## 2017-05-03 ENCOUNTER — Encounter: Payer: Self-pay | Admitting: Family Medicine

## 2017-05-04 ENCOUNTER — Ambulatory Visit: Payer: PPO | Admitting: Physical Therapy

## 2017-05-07 ENCOUNTER — Ambulatory Visit: Payer: PPO | Admitting: Physical Therapy

## 2017-05-11 ENCOUNTER — Ambulatory Visit: Payer: PPO | Admitting: Physical Therapy

## 2017-05-14 ENCOUNTER — Ambulatory Visit: Payer: PPO | Admitting: Physical Therapy

## 2017-05-14 ENCOUNTER — Encounter: Payer: Self-pay | Admitting: Physical Therapy

## 2017-05-14 DIAGNOSIS — M7501 Adhesive capsulitis of right shoulder: Secondary | ICD-10-CM | POA: Diagnosis not present
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
## 2017-05-18 ENCOUNTER — Encounter: Payer: Self-pay | Admitting: Physical Therapy

## 2017-05-18 ENCOUNTER — Ambulatory Visit: Payer: PPO | Admitting: Physical Therapy

## 2017-05-18 DIAGNOSIS — M7501 Adhesive capsulitis of right shoulder: Secondary | ICD-10-CM
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
## 2017-05-21 ENCOUNTER — Ambulatory Visit: Payer: PPO | Admitting: Family Medicine

## 2017-05-23 ENCOUNTER — Ambulatory Visit (INDEPENDENT_AMBULATORY_CARE_PROVIDER_SITE_OTHER): Payer: PPO | Admitting: Family Medicine

## 2017-05-23 ENCOUNTER — Encounter: Payer: Self-pay | Admitting: Family Medicine

## 2017-05-23 ENCOUNTER — Ambulatory Visit: Payer: PPO | Admitting: Physical Therapy

## 2017-05-23 ENCOUNTER — Encounter: Payer: Self-pay | Admitting: Physical Therapy

## 2017-05-23 ENCOUNTER — Other Ambulatory Visit: Payer: Self-pay

## 2017-05-23 VITALS — BP 140/80 | HR 90 | Temp 98.5°F | Ht 61.0 in | Wt 167.5 lb

## 2017-05-23 DIAGNOSIS — M7501 Adhesive capsulitis of right shoulder: Secondary | ICD-10-CM

## 2017-05-23 DIAGNOSIS — E11618 Type 2 diabetes mellitus with other diabetic arthropathy: Secondary | ICD-10-CM
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
## 2017-05-25 ENCOUNTER — Encounter: Payer: PPO | Admitting: Physical Therapy

## 2017-05-28 ENCOUNTER — Ambulatory Visit: Payer: PPO | Admitting: Physical Therapy

## 2017-06-01 ENCOUNTER — Ambulatory Visit: Payer: PPO | Attending: Family Medicine | Admitting: Physical Therapy

## 2017-06-01 ENCOUNTER — Encounter: Payer: Self-pay | Admitting: Physical Therapy

## 2017-06-01 DIAGNOSIS — M7501 Adhesive capsulitis of right shoulder: Secondary | ICD-10-CM | POA: Diagnosis not present
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
## 2017-06-04 ENCOUNTER — Ambulatory Visit: Payer: PPO | Admitting: Physical Therapy

## 2017-06-04 ENCOUNTER — Encounter: Payer: Self-pay | Admitting: Physical Therapy

## 2017-06-04 DIAGNOSIS — M7501 Adhesive capsulitis of right shoulder: Secondary | ICD-10-CM
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
## 2017-06-05 DIAGNOSIS — E1165 Type 2 diabetes mellitus with hyperglycemia: Secondary | ICD-10-CM | POA: Diagnosis not present

## 2017-06-05 DIAGNOSIS — E1142 Type 2 diabetes mellitus with diabetic polyneuropathy: Secondary | ICD-10-CM | POA: Diagnosis not present

## 2017-06-05 DIAGNOSIS — E1159 Type 2 diabetes mellitus with other circulatory complications: Secondary | ICD-10-CM | POA: Diagnosis not present

## 2017-06-05 DIAGNOSIS — I10 Essential (primary) hypertension: Secondary | ICD-10-CM | POA: Diagnosis not present

## 2017-06-05 LAB — HEMOGLOBIN A1C: Hemoglobin A1C: 13.9

## 2017-06-06 DIAGNOSIS — H409 Unspecified glaucoma: Secondary | ICD-10-CM | POA: Diagnosis not present

## 2017-06-08 ENCOUNTER — Encounter: Payer: Self-pay | Admitting: Physical Therapy

## 2017-06-08 ENCOUNTER — Ambulatory Visit: Payer: PPO | Admitting: Physical Therapy

## 2017-06-08 DIAGNOSIS — M7501 Adhesive capsulitis of right shoulder: Secondary | ICD-10-CM
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
## 2017-06-21 ENCOUNTER — Other Ambulatory Visit: Payer: Self-pay | Admitting: *Deleted

## 2017-06-21 ENCOUNTER — Encounter: Payer: Self-pay | Admitting: *Deleted

## 2017-06-22 ENCOUNTER — Ambulatory Visit: Payer: Self-pay | Admitting: Family Medicine

## 2017-07-25 ENCOUNTER — Ambulatory Visit: Payer: PPO | Admitting: Family Medicine

## 2017-08-24 ENCOUNTER — Encounter: Payer: Self-pay | Admitting: Ophthalmology

## 2017-10-30 ENCOUNTER — Encounter (INDEPENDENT_AMBULATORY_CARE_PROVIDER_SITE_OTHER): Payer: Self-pay

## 2017-10-30 ENCOUNTER — Encounter: Payer: Self-pay | Admitting: Family Medicine

## 2017-10-30 ENCOUNTER — Ambulatory Visit (INDEPENDENT_AMBULATORY_CARE_PROVIDER_SITE_OTHER): Payer: PPO | Admitting: Family Medicine

## 2017-10-30 VITALS — BP 160/88 | HR 90 | Temp 99.0°F | Ht 61.0 in | Wt 169.2 lb

## 2017-10-30 DIAGNOSIS — Z9114 Patient's other noncompliance with medication regimen: Secondary | ICD-10-CM

## 2017-10-30 DIAGNOSIS — B9789 Other viral agents as the cause of diseases classified elsewhere: Secondary | ICD-10-CM

## 2017-10-30 DIAGNOSIS — I10 Essential (primary) hypertension: Secondary | ICD-10-CM

## 2017-10-30 DIAGNOSIS — E1349 Other specified diabetes mellitus with other diabetic neurological complication: Secondary | ICD-10-CM

## 2017-10-30 DIAGNOSIS — E1365 Other specified diabetes mellitus with hyperglycemia: Secondary | ICD-10-CM

## 2017-10-30 DIAGNOSIS — IMO0002 Reserved for concepts with insufficient information to code with codable children: Secondary | ICD-10-CM

## 2017-10-30 DIAGNOSIS — J069 Acute upper respiratory infection, unspecified: Secondary | ICD-10-CM

## 2017-10-30 LAB — HEMOGLOBIN A1C: Hgb A1c MFr Bld: 12 % — ABNORMAL HIGH (ref 4.6–6.5)

## 2017-10-30 MED ORDER — HYDROCODONE-HOMATROPINE 5-1.5 MG/5ML PO SYRP: 5.0000 mL | ORAL_SOLUTION | Freq: Every evening | ORAL | 0 refills | Status: DC | PRN

## 2017-10-30 NOTE — Assessment & Plan Note (Signed)
Symptomatic care. Cough suppressants provided.

## 2017-10-30 NOTE — Assessment & Plan Note (Signed)
Followed by ENDO. Has been noncompliant with meds... Encouraged pt to take meds and work on lifestyle.  Eval A1c today.

## 2017-10-30 NOTE — Progress Notes (Signed)
   Subjective:    Patient ID: Patricia Horne, female    DOB: Aug 23, 1950, 67 y.o.   MRN: [id number]  Cough  This is a new problem. The problem has been gradually worsening. The problem occurs constantly. The cough is productive of sputum. Associated symptoms include nasal congestion, postnasal drip and shortness of breath. Pertinent negatives include no ear congestion, ear pain, fever, myalgias, sore throat or wheezing. The symptoms are aggravated by lying down. Risk factors: nonsmoker. She has tried prescription cough suppressant for the symptoms. The treatment provided moderate relief.    She has not taken any of her medication in days given grandma sick  Blood pressure (!) 160/88, pulse 90, temperature 99 F (37.2 C), temperature source Oral, height 5\' 1"  (1.549 m), weight 169 lb 4 oz (76.8 kg).  Review of Systems  Constitutional: Negative for fever.  HENT: Positive for postnasal drip. Negative for ear pain and sore throat.   Respiratory: Positive for cough and shortness of breath. Negative for wheezing.   Musculoskeletal: Negative for myalgias.       Objective:   Physical Exam  Constitutional: Vital signs are normal. She appears well-developed and well-nourished. She is cooperative.  Non-toxic appearance. She does not appear ill. No distress.  HENT:  Head: Normocephalic.  Right Ear: Hearing, tympanic membrane, external ear and ear canal normal. Tympanic membrane is not erythematous, not retracted and not bulging.  Left Ear: Hearing, tympanic membrane, external ear and ear canal normal. Tympanic membrane is not erythematous, not retracted and not bulging.  Nose: Mucosal edema and rhinorrhea present. Right sinus exhibits no maxillary sinus tenderness and no frontal sinus tenderness. Left sinus exhibits no maxillary sinus tenderness and no frontal sinus tenderness.  Mouth/Throat: Uvula is midline, oropharynx is clear and moist and mucous membranes are normal.  Eyes: Pupils are equal,  round, and reactive to light. Conjunctivae, EOM and lids are normal. Lids are everted and swept, no foreign bodies found.  Neck: Trachea normal and normal range of motion. Neck supple. Carotid bruit is not present. No thyroid mass and no thyromegaly present.  Cardiovascular: Normal rate, regular rhythm, S1 normal, S2 normal, normal heart sounds, intact distal pulses and normal pulses. Exam reveals no gallop and no friction rub.  No murmur heard. Pulmonary/Chest: Effort normal and breath sounds normal. No tachypnea. No respiratory distress. She has no decreased breath sounds. She has no wheezes. She has no rhonchi. She has no rales.  Neurological: She is alert.  Skin: Skin is warm, dry and intact. No rash noted.  Psychiatric: Her speech is normal and behavior is normal. Judgment normal. Her mood appears not anxious. Cognition and memory are normal. She does not exhibit a depressed mood.          Assessment & Plan:

## 2017-10-30 NOTE — Patient Instructions (Addendum)
TAKE YOUR MEDICINE!!!!!!  Rest, fluids.  Mucinex DM for cough and congestion. Can use benzonatate.  Can use tussinonex at bedtime for cough.  Please stop at the lab to have labs drawn.

## 2017-10-30 NOTE — Assessment & Plan Note (Signed)
Encouraged pt to be compliant with medication. She will take when she gets home.

## 2017-11-06 DIAGNOSIS — I10 Essential (primary) hypertension: Secondary | ICD-10-CM | POA: Diagnosis not present

## 2017-11-06 DIAGNOSIS — Z86010 Personal history of colonic polyps: Secondary | ICD-10-CM | POA: Diagnosis not present

## 2017-11-15 DIAGNOSIS — I10 Essential (primary) hypertension: Secondary | ICD-10-CM | POA: Diagnosis not present

## 2017-11-15 DIAGNOSIS — E1142 Type 2 diabetes mellitus with diabetic polyneuropathy: Secondary | ICD-10-CM | POA: Diagnosis not present

## 2017-11-15 DIAGNOSIS — E1159 Type 2 diabetes mellitus with other circulatory complications: Secondary | ICD-10-CM | POA: Diagnosis not present

## 2017-12-03 DIAGNOSIS — E113313 Type 2 diabetes mellitus with moderate nonproliferative diabetic retinopathy with macular edema, bilateral: Secondary | ICD-10-CM | POA: Diagnosis not present

## 2017-12-31 ENCOUNTER — Encounter: Payer: Self-pay | Admitting: *Deleted

## 2018-01-01 ENCOUNTER — Other Ambulatory Visit: Payer: Self-pay

## 2018-01-01 ENCOUNTER — Encounter
Admission: RE | Disposition: A | Discharge status: Home / Self Care | Payer: Self-pay | Source: Ambulatory Visit | Attending: Gastroenterology

## 2018-01-01 ENCOUNTER — Ambulatory Visit
Admission: RE | Admit: 2018-01-01 | Discharge: 2018-01-01 | Disposition: A | Discharge status: Home / Self Care | Payer: PPO | Source: Ambulatory Visit | Attending: Gastroenterology | Admitting: Gastroenterology

## 2018-01-01 ENCOUNTER — Ambulatory Visit: Payer: PPO | Admitting: Anesthesiology

## 2018-01-01 DIAGNOSIS — D175 Benign lipomatous neoplasm of intra-abdominal organs: Secondary | ICD-10-CM | POA: Insufficient documentation

## 2018-01-01 DIAGNOSIS — K5730 Diverticulosis of large intestine without perforation or abscess without bleeding: Secondary | ICD-10-CM | POA: Diagnosis not present

## 2018-01-01 DIAGNOSIS — Z79899 Other long term (current) drug therapy: Secondary | ICD-10-CM | POA: Insufficient documentation

## 2018-01-01 DIAGNOSIS — I10 Essential (primary) hypertension: Secondary | ICD-10-CM | POA: Insufficient documentation

## 2018-01-01 DIAGNOSIS — E119 Type 2 diabetes mellitus without complications: Secondary | ICD-10-CM | POA: Insufficient documentation

## 2018-01-01 DIAGNOSIS — D120 Benign neoplasm of cecum: Secondary | ICD-10-CM | POA: Diagnosis not present

## 2018-01-01 DIAGNOSIS — Z09 Encounter for follow-up examination after completed treatment for conditions other than malignant neoplasm: Secondary | ICD-10-CM | POA: Insufficient documentation

## 2018-01-01 DIAGNOSIS — E669 Obesity, unspecified: Secondary | ICD-10-CM | POA: Insufficient documentation

## 2018-01-01 DIAGNOSIS — Z86010 Personal history of colonic polyps: Secondary | ICD-10-CM | POA: Insufficient documentation

## 2018-01-01 DIAGNOSIS — Z794 Long term (current) use of insulin: Secondary | ICD-10-CM | POA: Insufficient documentation

## 2018-01-01 DIAGNOSIS — K5790 Diverticulosis of intestine, part unspecified, without perforation or abscess without bleeding: Secondary | ICD-10-CM | POA: Diagnosis not present

## 2018-01-01 DIAGNOSIS — D1779 Benign lipomatous neoplasm of other sites: Secondary | ICD-10-CM | POA: Diagnosis not present

## 2018-01-01 DIAGNOSIS — Z6832 Body mass index (BMI) 32.0-32.9, adult: Secondary | ICD-10-CM | POA: Insufficient documentation

## 2018-01-01 DIAGNOSIS — Z1211 Encounter for screening for malignant neoplasm of colon: Secondary | ICD-10-CM | POA: Diagnosis not present

## 2018-01-01 HISTORY — PX: COLONOSCOPY WITH PROPOFOL: SHX5780

## 2018-01-01 HISTORY — DX: Unspecified osteoarthritis, unspecified site: M19.90

## 2018-01-01 HISTORY — DX: Allergy status to unspecified drugs, medicaments and biological substances: Z88.9

## 2018-01-01 HISTORY — DX: Unspecified glaucoma: H40.9

## 2018-01-01 HISTORY — DX: Benign neoplasm of colon, unspecified: D12.6

## 2018-01-01 LAB — GLUCOSE, CAPILLARY: Glucose-Capillary: 214 mg/dL — ABNORMAL HIGH (ref 70–99)

## 2018-01-01 SURGERY — COLONOSCOPY WITH PROPOFOL
Anesthesia: General

## 2018-01-01 MED ORDER — PROPOFOL 500 MG/50ML IV EMUL
INTRAVENOUS | Status: AC
  Filled 2018-01-01: qty 50

## 2018-01-01 MED ORDER — MIDAZOLAM HCL 2 MG/2ML IJ SOLN
INTRAMUSCULAR | Status: AC
  Filled 2018-01-01: qty 2

## 2018-01-01 MED ORDER — FENTANYL CITRATE (PF) 100 MCG/2ML IJ SOLN
INTRAMUSCULAR | Status: AC
  Filled 2018-01-01: qty 2

## 2018-01-01 MED ORDER — EPHEDRINE SULFATE 50 MG/ML IJ SOLN
INTRAMUSCULAR | Status: DC | PRN
  Administered 2018-01-01: 10 mg via INTRAVENOUS

## 2018-01-01 MED ORDER — SODIUM CHLORIDE 0.9 % IV SOLN
INTRAVENOUS | Status: DC
  Administered 2018-01-01: 09:00:00 via INTRAVENOUS

## 2018-01-01 MED ORDER — MIDAZOLAM HCL 2 MG/2ML IJ SOLN
INTRAMUSCULAR | Status: DC | PRN
  Administered 2018-01-01: 1 mg via INTRAVENOUS

## 2018-01-01 MED ORDER — PROPOFOL 500 MG/50ML IV EMUL
INTRAVENOUS | Status: DC | PRN
  Administered 2018-01-01: 120 ug/kg/min via INTRAVENOUS

## 2018-01-01 MED ORDER — FENTANYL CITRATE (PF) 100 MCG/2ML IJ SOLN
INTRAMUSCULAR | Status: DC | PRN
  Administered 2018-01-01: 50 ug via INTRAVENOUS

## 2018-01-01 MED ORDER — EPHEDRINE SULFATE 50 MG/ML IJ SOLN
INTRAMUSCULAR | Status: AC
  Filled 2018-01-01: qty 1

## 2018-01-01 NOTE — Anesthesia Procedure Notes (Signed)
Performed by: Cook-Martin, Abbygail [place] Pre-anesthesia Checklist: Patient identified, Emergency Drugs available, Suction available, Patient being monitored and Timeout performed Patient Re-evaluated:Patient Re-evaluated prior to induction Oxygen Delivery Method: Nasal cannula Preoxygenation: Pre-oxygenation with 100% oxygen Induction Type: IV induction Placement Confirmation: positive ETCO2 and CO2 detector       

## 2018-01-01 NOTE — Anesthesia Postprocedure Evaluation (Signed)
Anesthesia Post Note  Patient: Patricia Horne  Procedure(s) Performed: COLONOSCOPY WITH PROPOFOL (N/A )  Patient location during evaluation: Endoscopy Anesthesia Type: General Level of consciousness: awake and alert Pain management: pain level controlled Vital Signs Assessment: post-procedure vital signs reviewed and stable Respiratory status: spontaneous breathing, nonlabored ventilation, respiratory function stable and patient connected to nasal cannula oxygen Cardiovascular status: blood pressure returned to baseline and stable Postop Assessment: no apparent nausea or vomiting Anesthetic complications: no     Last Vitals:  Vitals:   01/01/18 1020 01/01/18 1030  BP: (!) 161/84 (!) 170/94  Pulse: 97 93  Resp: (!) 21 18  Temp:    SpO2: 97% 98%    Last Pain:  Vitals:   01/01/18 0903  PainSc: 3                  Martha Clan

## 2018-01-01 NOTE — Transfer of Care (Signed)
Immediate Anesthesia Transfer of Care Note  Patient: Ara Grandmaison Hennigan  Procedure(s) Performed: COLONOSCOPY WITH PROPOFOL (N/A )  Patient Location: PACU  Anesthesia Type:General  Level of Consciousness: awake and sedated  Airway & Oxygen Therapy: Patient Spontanous Breathing and Patient connected to nasal cannula oxygen  Post-op Assessment: Report given to RN and Post -op Vital signs reviewed and stable  Post vital signs: Reviewed and stable  Last Vitals:  Vitals Value Taken Time  BP    Temp    Pulse    Resp    SpO2      Last Pain:  Vitals:   01/01/18 0903  PainSc: 3          Complications: No apparent anesthesia complications

## 2018-01-01 NOTE — Anesthesia Preprocedure Evaluation (Signed)
Anesthesia Evaluation  Patient identified by MRN, date of birth, ID band Patient awake    Reviewed: Allergy & Precautions, H&P , NPO status , Patient's Chart, lab work & pertinent test results, reviewed documented beta blocker date and time   History of Anesthesia Complications Negative for: history of anesthetic complications  Airway Mallampati: III  TM Distance: >3 FB Neck ROM: full    Dental  (+) Dental Advidsory Given, Teeth Intact, Caps Permanent bridge x2 on the top:   Pulmonary neg pulmonary ROS,           Cardiovascular Exercise Tolerance: Good hypertension, (-) angina(-) CAD, (-) Past MI, (-) Cardiac Stents and (-) CABG (-) dysrhythmias + Valvular Problems/Murmurs      Neuro/Psych PSYCHIATRIC DISORDERS Anxiety negative neurological ROS     GI/Hepatic negative GI ROS, Neg liver ROS,   Endo/Other  diabetes  Renal/GU negative Renal ROS  negative genitourinary   Musculoskeletal   Abdominal   Peds  Hematology negative hematology ROS (+)   Anesthesia Other Findings Past Medical History: No date: Abdominal pain, left lower quadrant No date: Acute upper respiratory infections of unspecified site No date: Allergic genetic state No date: Arthritis No date: Glaucoma No date: Lipoma of unspecified site No date: Obesity No date: Other and unspecified noninfectious gastroenteritis and  colitis(558.9) No date: Other screening mammogram No date: Routine general medical examination at a health care facility No date: Screening for lipoid disorders No date: Special screening for osteoporosis No date: Tibialis tendinitis 03/31/2014: Tubular adenoma of colon No date: Type II or unspecified type diabetes mellitus without  mention of complication, not stated as uncontrolled No date: Undiagnosed cardiac murmurs No date: Unspecified essential hypertension   Reproductive/Obstetrics negative OB ROS                              Anesthesia Physical Anesthesia Plan  ASA: II  Anesthesia Plan: General   Post-op Pain Management:    Induction: Intravenous  PONV Risk Score and Plan: 3 and Propofol infusion and TIVA  Airway Management Planned: Nasal Cannula and Natural Airway  Additional Equipment:   Intra-op Plan:   Post-operative Plan:   Informed Consent: I have reviewed the patients History and Physical, chart, labs and discussed the procedure including the risks, benefits and alternatives for the proposed anesthesia with the patient or authorized representative who has indicated his/her understanding and acceptance.   Dental Advisory Given  Plan Discussed with: Anesthesiologist, CRNA and Surgeon  Anesthesia Plan Comments:         Anesthesia Quick Evaluation

## 2018-01-01 NOTE — Anesthesia Post-op Follow-up Note (Signed)
Anesthesia QCDR form completed.        

## 2018-01-01 NOTE — Op Note (Signed)
[facility] Gastroenterology Patient Name: Patricia Horne Procedure Date: 01/01/2018 9:17 AM MRN: [id number] Account #: [id number] Date of Birth: September 11, 1950 Admit Type: Outpatient Age: 67 Room: [facility] ENDO ROOM 3 Gender: Female Note Status: Finalized Procedure:            Colonoscopy Indications:          Personal history of colonic polyps Providers:            Lollie Sails, MD Referring MD:         Jinny Sanders MD, MD (Referring MD) Medicines:            Monitored Anesthesia Care Complications:        No immediate complications. Procedure:            Pre-Anesthesia Assessment:                       - ASA Grade Assessment: II - A patient with mild                        systemic disease.                       After obtaining informed consent, the colonoscope was                        passed under direct vision. Throughout the procedure,                        the patient's blood pressure, pulse, and oxygen                        saturations were monitored continuously. The                        Colonoscope was introduced through the anus and                        advanced to the the cecum, identified by appendiceal                        orifice and ileocecal valve. The colonoscopy was                        performed without difficulty. The patient tolerated the                        procedure well. The quality of the bowel preparation                        was good. Findings:      A few small-mouthed diverticula were found in the sigmoid colon.      There was a small lipoma, 12 mm in diameter, in the cecum. Positive       pillow sign. Biopsies were taken with a cold forceps for histology with       extruding fat sign noted.      The exam was otherwise without abnormality.      The digital rectal exam was normal. Impression:           - Diverticulosis in the sigmoid colon.                       -  Small lipoma in the cecum. Biopsied.    - The examination was otherwise normal. Recommendation:       - Discharge patient to home.                       - Soft diet today, then advance as tolerated to advance                        diet as tolerated.                       - Await pathology results. Procedure Code(s):    --- Professional ---                       [phone], Colonoscopy, flexible; with biopsy, single or                        multiple Diagnosis Code(s):    --- Professional ---                       D17.5, Benign lipomatous neoplasm of intra-abdominal                        organs                       Z86.010, Personal history of colonic polyps                       K57.30, Diverticulosis of large intestine without                        perforation or abscess without bleeding CPT copyright 2018 American Medical Association. All rights reserved. The codes documented in this report are preliminary and upon coder review may  be revised to meet current compliance requirements. Lollie Sails, MD 01/01/2018 9:57:19 AM This report has been signed electronically. Number of Addenda: 0 Note Initiated On: 01/01/2018 9:17 AM Scope Withdrawal Time: 0 hours 9 minutes 19 seconds  Total Procedure Duration: 0 hours 16 minutes 59 seconds       [facility]

## 2018-01-01 NOTE — H&P (Signed)
Outpatient short stay form Pre-procedure 01/01/2018 9:21 AM Lollie Sails MD  Primary Physician: Dr. Eliezer Lofts  Reason for visit: Colonoscopy  History of present illness: Patient is a 67 year old female presenting today for colonoscopy in regards her personal history of adenomatous colon polyps.  Her last colonoscopy was 03/17/2014 3 adenomas at that time.  Tolerated prep well.  She denies any problems with rectal bleeding abdominal pain or diarrhea.  She takes no aspirin or blood thinning agent.    Current Facility-Administered Medications:  .  0.9 %  sodium chloride infusion, , Intravenous, Continuous, Lollie Sails, MD, Last Rate: 20 mL/hr at 01/01/18 0910  Medications Prior to Admission  Medication Sig Dispense Refill Last Dose  . losartan (COZAAR) 50 MG tablet Take 50 mg by mouth daily.  11 12/31/2017 at 2300  . amLODipine (NORVASC) 10 MG tablet TAKE 1 TABLET (10 MG TOTAL) BY MOUTH DAILY. (Patient not taking: Reported on 10/30/2017) 90 tablet 1 Not Taking  . baclofen (LIORESAL) 10 MG tablet Take 1 tablet (10 mg total) by mouth 3 (three) times daily. (Patient not taking: Reported on 10/30/2017) 30 each 0 Not Taking  . benzonatate (TESSALON) 100 MG capsule Take 1 capsule (100 mg total) by mouth 3 (three) times daily as needed for cough. (Patient not taking: Reported on 10/30/2017) 30 capsule 0 Not Taking  . glipiZIDE (GLUCOTROL XL) 10 MG 24 hr tablet Take 1 tablet (10 mg total) by mouth daily with breakfast. (Patient not taking: Reported on 10/30/2017) 30 tablet 11 Not Taking  . glucose blood (FREESTYLE TEST STRIPS) test strip Use to check blood sugar daily. (Patient not taking: Reported on 10/30/2017) 100 each 3 Not Taking  . glucose monitoring kit (FREESTYLE) monitoring kit 1 each by Does not apply route as needed for other. (Patient not taking: Reported on 10/30/2017) 1 each 0 Not Taking  . HYDROcodone-homatropine (HYCODAN) 5-1.5 MG/5ML syrup Take 5 mLs by mouth at bedtime as needed  for cough. (Patient not taking: Reported on 01/01/2018) 120 mL 0 Not Taking at Unknown time  . Insulin Detemir (LEVEMIR) 100 UNIT/ML Pen Inject 50 Units into the skin daily. (Patient not taking: Reported on 10/30/2017) 15 mL 11 Not Taking  . irbesartan (AVAPRO) 150 MG tablet    Not Taking at Unknown time  . JARDIANCE 25 MG TABS tablet Take 25 mg by mouth daily.  6 Not Taking at Unknown time  . Lancets (FREESTYLE) lancets Use to check blood sugar daily.  Dx. E13.49, E13.65 (Patient not taking: Reported on 10/30/2017) 100 each 3 Not Taking  . meloxicam (MOBIC) 15 MG tablet Take 1 tablet (15 mg total) by mouth daily. (Patient not taking: Reported on 10/30/2017) 30 tablet 0 Not Taking  . metFORMIN (GLUCOPHAGE-XR) 500 MG 24 hr tablet TAKE 1 TABLET BY MOUTH EVERY DAY WITH DINNER  11 12/30/2017     No Known Allergies   Past Medical History:  Diagnosis Date  . Abdominal pain, left lower quadrant   . Acute upper respiratory infections of unspecified site   . Allergic genetic state   . Arthritis   . Glaucoma   . Lipoma of unspecified site   . Obesity   . Other and unspecified noninfectious gastroenteritis and colitis(558.9)   . Other screening mammogram   . Routine general medical examination at a health care facility   . Screening for lipoid disorders   . Special screening for osteoporosis   . Tibialis tendinitis   . Tubular adenoma of colon 03/31/2014  .  Type II or unspecified type diabetes mellitus without mention of complication, not stated as uncontrolled   . Undiagnosed cardiac murmurs   . Unspecified essential hypertension     Review of systems:      Physical Exam    Heart and lungs: Regular rate and rhythm without rub or gallop, lungs are bilaterally clear.    HEENT: Normocephalic atraumatic eyes are anicteric    Other:    Pertinant exam for procedure: Soft nontender nondistended bowel sounds positive normoactive.    Planned proceedures: Colonoscopy and indicated  procedures. I have discussed the risks benefits and complications of procedures to include not limited to bleeding, infection, perforation and the risk of sedation and the patient wishes to proceed.    Lollie Sails, MD Gastroenterology 01/01/2018  9:21 AM

## 2018-01-02 ENCOUNTER — Encounter: Payer: Self-pay | Admitting: Gastroenterology

## 2018-01-02 LAB — SURGICAL PATHOLOGY

## 2018-02-07 DIAGNOSIS — E113393 Type 2 diabetes mellitus with moderate nonproliferative diabetic retinopathy without macular edema, bilateral: Secondary | ICD-10-CM | POA: Diagnosis not present

## 2018-02-07 LAB — HM DIABETES EYE EXAM

## 2018-02-12 ENCOUNTER — Encounter: Payer: Self-pay | Admitting: Family Medicine

## 2018-03-29 ENCOUNTER — Ambulatory Visit (INDEPENDENT_AMBULATORY_CARE_PROVIDER_SITE_OTHER)
Admission: RE | Admit: 2018-03-29 | Discharge: 2018-03-29 | Disposition: A | Discharge status: Home / Self Care | Payer: PPO | Source: Ambulatory Visit | Attending: Family Medicine | Admitting: Family Medicine

## 2018-03-29 ENCOUNTER — Encounter: Payer: Self-pay | Admitting: Family Medicine

## 2018-03-29 ENCOUNTER — Ambulatory Visit (INDEPENDENT_AMBULATORY_CARE_PROVIDER_SITE_OTHER): Payer: PPO | Admitting: Family Medicine

## 2018-03-29 VITALS — BP 152/94 | HR 86 | Temp 98.2°F | Ht 61.0 in | Wt 174.5 lb

## 2018-03-29 DIAGNOSIS — R053 Chronic cough: Secondary | ICD-10-CM | POA: Insufficient documentation

## 2018-03-29 DIAGNOSIS — Z9114 Patient's other noncompliance with medication regimen: Secondary | ICD-10-CM | POA: Diagnosis not present

## 2018-03-29 DIAGNOSIS — I10 Essential (primary) hypertension: Secondary | ICD-10-CM | POA: Diagnosis not present

## 2018-03-29 DIAGNOSIS — R05 Cough: Secondary | ICD-10-CM | POA: Diagnosis not present

## 2018-03-29 DIAGNOSIS — M549 Dorsalgia, unspecified: Secondary | ICD-10-CM

## 2018-03-29 LAB — HM DIABETES FOOT EXAM

## 2018-03-29 NOTE — Assessment & Plan Note (Signed)
Continues to be noncompliant with my recommendations and ENDO recommendations. Again reviewed risk of not treating DM and HTN. Pt voiced understanding.

## 2018-03-29 NOTE — Progress Notes (Signed)
Subjective:    Patient ID: Patricia Horne, female    DOB: [birth year], 68 y.o.   MRN: [id number]  HPI  68 year old female with poorly controlled diabetes presents with  Continued  Intermittent chronic cough x 6 months and mid back pain/ache x last month. ( no radiation, no numbness, no weakness) Cough is dry and feels like she is strangling.  no hemoptysis.  no post nasal drip, no nasal congestion.  No SOB, no wheeze. ( She is not taking the losartan)  She has not been using  Any allergy medicaiton  Nonsomker  No family pulmonary issues.   Social History /Family History/Past Medical History reviewed in detail and updated in EMR if needed. Blood pressure (!) 152/94, pulse 86, temperature 98.2 F (36.8 C), temperature source Oral, height 5\' 1"  (1.549 m), weight 174 lb 8 oz (79.2 kg), SpO2 98 %.  Review of Systems  Constitutional: Negative for fatigue and fever.  HENT: Negative for congestion.   Eyes: Negative for pain.  Respiratory: Negative for cough and shortness of breath.   Cardiovascular: Negative for chest pain, palpitations and leg swelling.  Gastrointestinal: Negative for abdominal pain.  Genitourinary: Negative for dysuria and vaginal bleeding.  Musculoskeletal: Negative for back pain.  Neurological: Negative for syncope, light-headedness and headaches.  Psychiatric/Behavioral: Negative for dysphoric mood.       Objective:   Physical Exam Constitutional:      General: She is not in acute distress.    Appearance: Normal appearance. She is well-developed. She is not ill-appearing or toxic-appearing.  HENT:     Head: Normocephalic.     Right Ear: Hearing, tympanic membrane, ear canal and external ear normal. Tympanic membrane is not erythematous, retracted or bulging.     Left Ear: Hearing, tympanic membrane, ear canal and external ear normal. Tympanic membrane is not erythematous, retracted or bulging.     Nose: No mucosal edema or rhinorrhea.     Right Sinus: No  maxillary sinus tenderness or frontal sinus tenderness.     Left Sinus: No maxillary sinus tenderness or frontal sinus tenderness.     Mouth/Throat:     Pharynx: Uvula midline.  Eyes:     General: Lids are normal. Lids are everted, no foreign bodies appreciated.     Conjunctiva/sclera: Conjunctivae normal.     Pupils: Pupils are equal, round, and reactive to light.  Neck:     Musculoskeletal: Normal range of motion and neck supple.     Thyroid: No thyroid mass or thyromegaly.     Vascular: No carotid bruit.     Trachea: Trachea normal.  Cardiovascular:     Rate and Rhythm: Normal rate and regular rhythm.     Pulses: Normal pulses.     Heart sounds: Normal heart sounds, S1 normal and S2 normal. No murmur. No friction rub. No gallop.   Pulmonary:     Effort: Pulmonary effort is normal. No tachypnea or respiratory distress.     Breath sounds: Normal breath sounds. No decreased breath sounds, wheezing, rhonchi or rales.  Abdominal:     General: Bowel sounds are normal.     Palpations: Abdomen is soft.     Tenderness: There is no abdominal tenderness.  Skin:    General: Skin is warm and dry.     Findings: No rash.  Neurological:     Mental Status: She is alert.  Psychiatric:        Mood and Affect: Mood is not anxious or  depressed.        Speech: Speech normal.        Behavior: Behavior normal. Behavior is cooperative.        Thought Content: Thought content normal.        Judgment: Judgment normal.           Assessment & Plan:

## 2018-03-29 NOTE — Assessment & Plan Note (Signed)
Restart BP medication.

## 2018-03-29 NOTE — Assessment & Plan Note (Signed)
>  2 months.. will eval with CXR. Nonsmoker and lungs CTAB  Has not tried allergy or reflux treatment. If these do not help consider ENT referral.

## 2018-03-29 NOTE — Patient Instructions (Addendum)
Restart the medications.  We will  Call with chest x-ray results. Can try zyrtec at bedtime for allergies as cause of cough.. or if that does not help after 2-3 weeks.. can try prilosec 20 mg over the counter.

## 2018-03-29 NOTE — Addendum Note (Signed)
Addended by: Ellamae Sia on: 03/29/2018 03:42 PM   Modules accepted: Orders

## 2018-03-29 NOTE — Assessment & Plan Note (Signed)
Most likely MSK strain. Heat , massage, stretching.

## 2018-05-21 ENCOUNTER — Encounter: Payer: Self-pay | Admitting: Family Medicine

## 2018-05-21 ENCOUNTER — Ambulatory Visit (INDEPENDENT_AMBULATORY_CARE_PROVIDER_SITE_OTHER): Payer: PPO | Admitting: Family Medicine

## 2018-05-21 DIAGNOSIS — Z91148 Patient's other noncompliance with medication regimen for other reason: Secondary | ICD-10-CM

## 2018-05-21 DIAGNOSIS — IMO0002 Reserved for concepts with insufficient information to code with codable children: Secondary | ICD-10-CM

## 2018-05-21 DIAGNOSIS — Z9114 Patient's other noncompliance with medication regimen: Secondary | ICD-10-CM

## 2018-05-21 DIAGNOSIS — E1365 Other specified diabetes mellitus with hyperglycemia: Secondary | ICD-10-CM | POA: Diagnosis not present

## 2018-05-21 DIAGNOSIS — E1349 Other specified diabetes mellitus with other diabetic neurological complication: Secondary | ICD-10-CM | POA: Diagnosis not present

## 2018-05-21 DIAGNOSIS — E1140 Type 2 diabetes mellitus with diabetic neuropathy, unspecified: Secondary | ICD-10-CM

## 2018-05-21 DIAGNOSIS — I10 Essential (primary) hypertension: Secondary | ICD-10-CM

## 2018-05-21 MED ORDER — GABAPENTIN 100 MG PO CAPS: 100.0000 mg | ORAL_CAPSULE | Freq: Every day | ORAL | 3 refills | Status: DC

## 2018-05-21 MED ORDER — FREESTYLE LIBRE 14 DAY SENSOR MISC: 1.0000 [IU] | 11 refills | Status: DC | PRN

## 2018-05-21 MED ORDER — LOSARTAN POTASSIUM 50 MG PO TABS: 50.0000 mg | ORAL_TABLET | Freq: Every day | ORAL | 1 refills | Status: DC

## 2018-05-21 MED ORDER — INSULIN DETEMIR 100 UNIT/ML FLEXPEN: 50.0000 [IU] | PEN_INJECTOR | Freq: Every day | SUBCUTANEOUS | 5 refills | Status: DC

## 2018-05-21 MED ORDER — METFORMIN HCL ER 500 MG PO TB24: ORAL_TABLET | ORAL | 1 refills | Status: DC

## 2018-05-21 NOTE — Assessment & Plan Note (Signed)
Likely poor control. Pt will come in for curbside BP once back on BP med.

## 2018-05-21 NOTE — Assessment & Plan Note (Signed)
Very poor control. Encouraged pt to restart at least metfomrin and insulin... may need to add back jardiance, glipize in furture for control. Encouraged exercise, weight loss, healthy eating habits.  Due for re-eval A1C.

## 2018-05-21 NOTE — Patient Instructions (Addendum)
Restart metformin 500 mg daily and levemir 50 UNITs  as on problem list. Call refills if needed.  Reapply freestyle libre.  Restart losartan 50 mg daily... if BP remaining > 140./90 add back amlodipine.  Start gabapentin at bedtime for neuropathy.

## 2018-05-21 NOTE — Assessment & Plan Note (Signed)
Encouraged pt to take medication. Addressed barriers.

## 2018-05-21 NOTE — Assessment & Plan Note (Addendum)
Likely cause of symtpoms.. start low dose gabapentin at night.  Will check B12 , electrolytes and TSH to rule out other cause.

## 2018-05-21 NOTE — Progress Notes (Signed)
VIRTUAL VISIT Due to national recommendations of social distancing due to [place] 19, a virtual visit is felt to be most appropriate for this patient at this time.   I connected with the patient on 05/21/18 at  3:00 PM EDT by virtual telehealth platform and verified that I am speaking with the correct person using two identifiers.   Interactive audio and video telecommunications were attempted between this provider and patient, however failed, due to patient having technical difficulties OR patient did not have access to video capability.  We continued and completed visit with audio only.    I discussed the limitations, risks, security and privacy concerns of performing an evaluation and management service by  virtual telehealth platform and the availability of in person appointments. I also discussed with the patient that there may be a patient responsible charge related to this service. The patient expressed understanding and agreed to proceed.  Patient location: Home Provider Location: [place] Participants: Eliezer Lofts and [name]   Chief Complaint  Patient presents with  . Foot Pain    Burning/Stinging-Bilateral  . Hand Pain    Tingling-Bilateral    History of Present Illness: 68 year old female poorly controlled diabetic female who is noncompliant with DM meds presents with bilateral stinging and burning in feet and hands in last 6 months, much worse in last few days.  Never before in hand, only in feet.  Describes as burning , stinging, tingling in bilateral hands and feet.  Seems to  be worse when lying down.    Has not been checking blood sugar lately. Was doing libre freestyle but not lately. No neck pain. No fall.  no ETOH  Diabetes:   Currently followed by ENDO([facility], Dr. Honor Junes) .. at last [place] with ENDO 10/2017 told to get back on her insulin, Jardiance and her metformin (1000 mg daily).  she did not follow up as recommended in 3 months (  01/2018)   SHE REPORTS SHE STILL HAS NOT BEEN TAKING AN DM MEDS  Lab Results  Component Value Date   HGBA1C 12.0 (H) 10/30/2017  Feet problems: no uclers Blood Sugars averaging: eye exam within last year: Has complication of neuropathy and retinopathy   Tyelnol  Prn did not help.  COVID 19 screen No recent travel or known exposure to COVID19 The patient denies respiratory symptoms of COVID 19 at this time.  The importance of social distancing was discussed today.   Review of Systems  Constitutional: Negative for chills and fever.  HENT: Negative for congestion and ear pain.   Eyes: Negative for pain and redness.  Respiratory: Negative for cough and shortness of breath.   Cardiovascular: Negative for chest pain, palpitations and leg swelling.  Gastrointestinal: Negative for abdominal pain, blood in stool, constipation, diarrhea, nausea and vomiting.  Genitourinary: Negative for dysuria.  Musculoskeletal: Negative for falls and myalgias.  Skin: Negative for rash.  Neurological: Negative for dizziness.  Psychiatric/Behavioral: Negative for depression. The patient is not nervous/anxious.       Past Medical History:  Diagnosis Date  . Abdominal pain, left lower quadrant   . Acute upper respiratory infections of unspecified site   . Allergic genetic state   . Arthritis   . Glaucoma   . Lipoma of unspecified site   . Obesity   . Other and unspecified noninfectious gastroenteritis and colitis(558.9)   . Other screening mammogram   . Routine general medical examination at a health care facility   .  Screening for lipoid disorders   . Special screening for osteoporosis   . Tibialis tendinitis   . Tubular adenoma of colon 03/31/2014  . Type II or unspecified type diabetes mellitus without mention of complication, not stated as uncontrolled   . Undiagnosed cardiac murmurs   . Unspecified essential hypertension     reports that she has never smoked. She has never used smokeless  tobacco. She reports that she does not drink alcohol or use drugs.   Current Outpatient Medications:  .  amLODipine (NORVASC) 10 MG tablet, TAKE 1 TABLET (10 MG TOTAL) BY MOUTH DAILY. (Patient not taking: Reported on 03/29/2018), Disp: 90 tablet, Rfl: 1 .  glipiZIDE (GLUCOTROL XL) 10 MG 24 hr tablet, Take 1 tablet (10 mg total) by mouth daily with breakfast. (Patient not taking: Reported on 03/29/2018), Disp: 30 tablet, Rfl: 11 .  glucose monitoring kit (FREESTYLE) monitoring kit, 1 each by Does not apply route as needed for other. (Patient not taking: Reported on 05/21/2018), Disp: 1 each, Rfl: 0 .  Insulin Detemir (LEVEMIR) 100 UNIT/ML Pen, Inject 50 Units into the skin daily. (Patient not taking: Reported on 03/29/2018), Disp: 15 mL, Rfl: 11 .  JARDIANCE 25 MG TABS tablet, Take 25 mg by mouth daily., Disp: , Rfl: 6 .  losartan (COZAAR) 50 MG tablet, Take 50 mg by mouth daily., Disp: , Rfl: 11 .  meloxicam (MOBIC) 15 MG tablet, Take 1 tablet (15 mg total) by mouth daily. (Patient not taking: Reported on 03/29/2018), Disp: 30 tablet, Rfl: 0 .  metFORMIN (GLUCOPHAGE-XR) 500 MG 24 hr tablet, TAKE 1 TABLET BY MOUTH EVERY DAY WITH DINNER, Disp: , Rfl: 11   Observations/Objective: There were no vitals taken for this visit.  Physical Exam  Physical Exam Constitutional:      General: She is not in acute distress. Pulmonary:     Effort: Pulmonary effort is normal. No respiratory distress.  Neurological:     Mental Status: She is alert and oriented to person, place, and time.  Psychiatric:        Mood and Affect: Mood normal.        Behavior: Behavior normal.   Assessment and Plan   DM (diabetes mellitus), secondary, uncontrolled, with neurologic complications ([place]) Very poor control. Encouraged pt to restart at least metfomrin and insulin... may need to add back jardiance, glipize in furture for control. Encouraged exercise, weight loss, healthy eating habits.  Due for re-eval A1C.  Neuropathy  due to type 2 diabetes mellitus Likely cause of symtpoms.. start low dose gabapentin at night.  Will check B12 , electrolytes and TSH to rule out other cause.  Noncompliance with medication regimen Encouraged pt to take medication. Addressed barriers.  Essential hypertension, benign Likely poor control. Pt will come in for curbside BP once back on BP med.  Meds ordered this encounter  Medications  . gabapentin (NEURONTIN) 100 MG capsule    Sig: Take 1 capsule (100 mg total) by mouth at bedtime.    Dispense:  30 capsule    Refill:  3  . Continuous Blood Gluc Sensor (FREESTYLE LIBRE 14 DAY SENSOR) MISC    Sig: 1 Units by Does not apply route every 3 (three) days as needed.    Dispense:  4 each    Refill:  11  . metFORMIN (GLUCOPHAGE-XR) 500 MG 24 hr tablet    Sig: TAKE 1 TABLET BY MOUTH EVERY DAY WITH DINNER    Dispense:  90 tablet    Refill:  1  . losartan (COZAAR) 50 MG tablet    Sig: Take 1 tablet (50 mg total) by mouth daily.    Dispense:  90 tablet    Refill:  1  . Insulin Detemir (LEVEMIR) 100 UNIT/ML Pen    Sig: Inject 50 Units into the skin daily.    Dispense:  15 mL    Refill:  5   Orders Placed This Encounter  Procedures  . Hemoglobin A1c    This external order was created through the Results Console.  . Hemoglobin A1c    Standing Status:   Future    Standing Expiration Date:   05/21/2019  . Lipid panel    Standing Status:   Future    Standing Expiration Date:   05/21/2019  . Comprehensive metabolic panel    Standing Status:   Future    Standing Expiration Date:   05/21/2019  . Vitamin B12    Standing Status:   Future    Standing Expiration Date:   05/21/2019  . TSH    Standing Status:   Future    Standing Expiration Date:   05/21/2019    I discussed the assessment and treatment plan with the patient. The patient was provided an opportunity to ask questions and all were answered. The patient agreed with the plan and demonstrated an understanding of the  instructions.   The patient was advised to call back or seek an in-person evaluation if the symptoms worsen or if the condition fails to improve as anticipated.     Eliezer Lofts, MD

## 2018-05-22 NOTE — Progress Notes (Signed)
Appointment scheduled 06/04/2018 at 9:00 am with Dr. Diona Browner to follow up DM.

## 2018-05-22 NOTE — Progress Notes (Signed)
Appointment 4/24 lab and nurse visit @ 9

## 2018-05-24 ENCOUNTER — Other Ambulatory Visit: Payer: Self-pay | Admitting: Family Medicine

## 2018-05-24 ENCOUNTER — Ambulatory Visit (INDEPENDENT_AMBULATORY_CARE_PROVIDER_SITE_OTHER): Payer: PPO | Admitting: *Deleted

## 2018-05-24 ENCOUNTER — Other Ambulatory Visit: Payer: Self-pay

## 2018-05-24 ENCOUNTER — Other Ambulatory Visit (INDEPENDENT_AMBULATORY_CARE_PROVIDER_SITE_OTHER): Payer: PPO

## 2018-05-24 VITALS — BP 194/93 | HR 72

## 2018-05-24 DIAGNOSIS — E1140 Type 2 diabetes mellitus with diabetic neuropathy, unspecified: Secondary | ICD-10-CM

## 2018-05-24 DIAGNOSIS — I10 Essential (primary) hypertension: Secondary | ICD-10-CM

## 2018-05-24 DIAGNOSIS — E1365 Other specified diabetes mellitus with hyperglycemia: Secondary | ICD-10-CM | POA: Diagnosis not present

## 2018-05-24 DIAGNOSIS — E1349 Other specified diabetes mellitus with other diabetic neurological complication: Secondary | ICD-10-CM

## 2018-05-24 DIAGNOSIS — IMO0002 Reserved for concepts with insufficient information to code with codable children: Secondary | ICD-10-CM

## 2018-05-24 LAB — VITAMIN B12: Vitamin B-12: 267 pg/mL (ref 211–911)

## 2018-05-24 LAB — HEMOGLOBIN A1C: Hgb A1c MFr Bld: 13.3 % — ABNORMAL HIGH (ref 4.6–6.5)

## 2018-05-24 LAB — COMPREHENSIVE METABOLIC PANEL
ALT: 13 U/L (ref 0–35)
AST: 13 U/L (ref 0–37)
Albumin: 3.6 g/dL (ref 3.5–5.2)
Alkaline Phosphatase: 87 U/L (ref 39–117)
BUN: 23 mg/dL (ref 6–23)
CO2: 27 mEq/L (ref 19–32)
Calcium: 8.7 mg/dL (ref 8.4–10.5)
Chloride: 103 mEq/L (ref 96–112)
Creatinine, Ser: 0.8 mg/dL (ref 0.40–1.20)
GFR: 86.38 mL/min (ref >60.00)
Glucose, Bld: 279 mg/dL — ABNORMAL HIGH (ref 70–99)
Potassium: 4.5 mEq/L (ref 3.5–5.1)
Sodium: 137 mEq/L (ref 135–145)
Total Bilirubin: 0.6 mg/dL (ref 0.2–1.2)
Total Protein: 6.8 g/dL (ref 6.0–8.3)

## 2018-05-24 LAB — LIPID PANEL
Cholesterol: 145 mg/dL (ref 0–200)
HDL: 39.5 mg/dL (ref >39.00)
LDL Cholesterol: 80 mg/dL (ref 0–99)
NonHDL: 105.52
Total CHOL/HDL Ratio: 4
Triglycerides: 130 mg/dL (ref 0.0–149.0)
VLDL: 26 mg/dL (ref 0.0–40.0)

## 2018-05-24 LAB — TSH: TSH: 1.56 u[IU]/mL (ref 0.35–4.50)

## 2018-05-24 MED ORDER — AMLODIPINE BESYLATE 10 MG PO TABS: 10.0000 mg | ORAL_TABLET | Freq: Every day | ORAL | 11 refills | Status: DC

## 2018-05-24 NOTE — Progress Notes (Signed)
Per Dr. Diona Browner encounter order on 05/24/2018, patient presents today for a nurse visit blood pressure check for ongoing follow up and management.  Vital Sign Readings today BP 194/93, Pulse 72 and SpO2 97%.  Patricia Horne has only been taking her Losartan 50 mg for two days.  She has not taken it today.  She denies any SOB but does have what she describes as chest burning more on the right side.  She has history of acid reflux and feels like the chest pain is coming from that because she only notices it when she lays down. Discussed with Dr. Diona Browner   Patient is to add amlodipine 10 mg daily, along with the Losartan 50 mg, and can try OTC Prilosec 20 mg for the acid reflux.  Patricia Horne it to follow up in 2 weeks as already scheduled.  Patient verbalizing understanding.

## 2018-05-24 NOTE — Progress Notes (Signed)
Pt in office for BP check only... very high.  needs to start back losartan as well as amlodipine.  Sent in amlodipine rx.

## 2018-05-27 ENCOUNTER — Encounter: Payer: Self-pay | Admitting: *Deleted

## 2018-06-04 ENCOUNTER — Ambulatory Visit: Payer: Self-pay | Admitting: Family Medicine

## 2018-06-06 ENCOUNTER — Other Ambulatory Visit: Payer: Self-pay

## 2018-06-06 ENCOUNTER — Emergency Department: Payer: PPO

## 2018-06-06 ENCOUNTER — Encounter: Payer: Self-pay | Admitting: Emergency Medicine

## 2018-06-06 ENCOUNTER — Emergency Department
Admission: EM | Admit: 2018-06-06 | Discharge: 2018-06-06 | Disposition: A | Discharge status: Home / Self Care | Payer: PPO | Attending: Emergency Medicine | Admitting: Emergency Medicine

## 2018-06-06 DIAGNOSIS — M7918 Myalgia, other site: Secondary | ICD-10-CM

## 2018-06-06 DIAGNOSIS — I10 Essential (primary) hypertension: Secondary | ICD-10-CM

## 2018-06-06 DIAGNOSIS — S199XXA Unspecified injury of neck, initial encounter: Secondary | ICD-10-CM | POA: Insufficient documentation

## 2018-06-06 DIAGNOSIS — Y9389 Activity, other specified: Secondary | ICD-10-CM | POA: Insufficient documentation

## 2018-06-06 DIAGNOSIS — Y998 Other external cause status: Secondary | ICD-10-CM | POA: Insufficient documentation

## 2018-06-06 DIAGNOSIS — M542 Cervicalgia: Secondary | ICD-10-CM | POA: Diagnosis not present

## 2018-06-06 DIAGNOSIS — Y92410 Unspecified street and highway as the place of occurrence of the external cause: Secondary | ICD-10-CM | POA: Insufficient documentation

## 2018-06-06 DIAGNOSIS — M50322 Other cervical disc degeneration at C5-C6 level: Secondary | ICD-10-CM | POA: Diagnosis not present

## 2018-06-06 MED ORDER — BACLOFEN 10 MG PO TABS: 10.0000 mg | ORAL_TABLET | Freq: Every day | ORAL | 0 refills | Status: DC

## 2018-06-06 MED ORDER — LIDOCAINE 5 % EX PTCH
1.0000 | MEDICATED_PATCH | CUTANEOUS | Status: DC
  Administered 2018-06-06: 1 via TRANSDERMAL
  Filled 2018-06-06: qty 1

## 2018-06-06 MED ORDER — IBUPROFEN 400 MG PO TABS
400.0000 mg | ORAL_TABLET | Freq: Once | ORAL | Status: AC
  Administered 2018-06-06: 400 mg via ORAL
  Filled 2018-06-06: qty 1

## 2018-06-06 MED ORDER — IBUPROFEN 400 MG PO TABS: 400.0000 mg | ORAL_TABLET | Freq: Four times a day (QID) | ORAL | 0 refills | Status: DC | PRN

## 2018-06-06 MED ORDER — LIDOCAINE 5 % EX PTCH: 1.0000 | MEDICATED_PATCH | CUTANEOUS | 0 refills | Status: DC
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
## 2018-06-07 ENCOUNTER — Ambulatory Visit: Payer: PPO | Admitting: Family Medicine

## 2018-06-12 ENCOUNTER — Ambulatory Visit (INDEPENDENT_AMBULATORY_CARE_PROVIDER_SITE_OTHER): Payer: PPO

## 2018-06-12 DIAGNOSIS — Z0000 Encounter for general adult medical examination without abnormal findings: Secondary | ICD-10-CM

## 2018-06-12 NOTE — Progress Notes (Signed)
Subjective:   Patricia Horne is a 68 y.o. female who presents for an Initial Medicare Annual Wellness Visit.  Review of Systems    N/A  Cardiac Risk Factors include: advanced age (>38mn, >>69women);obesity (BMI >30kg/m2);diabetes mellitus;hypertension     Objective:    Today's Vitals   06/12/18 1208  PainSc: 6   PainLoc: Back   There is no height or weight on file to calculate BMI.  Advanced Directives 06/12/2018 06/06/2018 01/01/2018 04/02/2017 05/12/2016 07/21/2015 10/25/2014  Does Patient Have a Medical Advance Directive? No No No No Yes No No  Type of Advance Directive - - - - HPress photographerLiving will - -  [place]? - - - - No - copy requested - -  Would patient like information on creating a medical advance directive? No - Patient declined No - Patient declined No - Patient declined No - Patient declined - No - patient declined information -    Current Medications (verified) Outpatient Encounter Medications as of 06/12/2018  Medication Sig  . amLODipine (NORVASC) 10 MG tablet Take 1 tablet (10 mg total) by mouth daily.  . baclofen (LIORESAL) 10 MG tablet Take 1 tablet (10 mg total) by mouth daily.  . Continuous Blood Gluc Sensor (FREESTYLE LIBRE 14 DAY SENSOR) MISC 1 Units by Does not apply route every 3 (three) days as needed.  .Marland Kitchenibuprofen (ADVIL) 400 MG tablet Take 1 tablet (400 mg total) by mouth every 6 (six) hours as needed.  . Insulin Detemir (LEVEMIR) 100 UNIT/ML Pen Inject 50 Units into the skin daily.  .Marland Kitchenlidocaine (LIDODERM) 5 % Place 1 patch onto the skin daily. Remove & Discard patch within 12 hours or as directed by MD  . metFORMIN (GLUCOPHAGE-XR) 500 MG 24 hr tablet TAKE 1 TABLET BY MOUTH EVERY DAY WITH DINNER  . gabapentin (NEURONTIN) 100 MG capsule Take 1 capsule (100 mg total) by mouth at bedtime. (Patient not taking: Reported on 06/12/2018)  . glucose monitoring kit (FREESTYLE) monitoring kit 1 each by Does  not apply route as needed for other. (Patient not taking: Reported on 05/21/2018)  . losartan (COZAAR) 50 MG tablet Take 1 tablet (50 mg total) by mouth daily. (Patient not taking: Reported on 06/12/2018)   No facility-administered encounter medications on file as of 06/12/2018.     Allergies (verified) Patient has no known allergies.   History: Past Medical History:  Diagnosis Date  . Abdominal pain, left lower quadrant   . Acute upper respiratory infections of unspecified site   . Allergic genetic state   . Arthritis   . Glaucoma   . Lipoma of unspecified site   . Obesity   . Other and unspecified noninfectious gastroenteritis and colitis(558.9)   . Other screening mammogram   . Routine general medical examination at a health care facility   . Screening for lipoid disorders   . Special screening for osteoporosis   . Tibialis tendinitis   . Tubular adenoma of colon 03/31/2014  . Type II or unspecified type diabetes mellitus without mention of complication, not stated as uncontrolled   . Undiagnosed cardiac murmurs   . Unspecified essential hypertension    Past Surgical History:  Procedure Laterality Date  . COLONOSCOPY  04/01/2004  . COLONOSCOPY  03/31/2014  . COLONOSCOPY WITH PROPOFOL N/A 01/01/2018   Procedure: COLONOSCOPY WITH PROPOFOL;  Surgeon: SLollie Sails MD;  [facility];  Service: Endoscopy;  Laterality: N/A;  .  LIPOMA EXCISION Left 2010   arm  . TOE SURGERY  1990's   bunion, hammer toe repair  . TOTAL ABDOMINAL HYSTERECTOMY  1983   one ovary removed, later other ovary removed   Family History  Problem Relation Age of Onset  . Diabetes Mother   . Arthritis Father   . Diabetes Brother        type 1  . Ulcerative colitis Paternal Grandmother   . Colon polyps Paternal Grandfather   . Breast cancer Neg Hx    Social History   Socioeconomic History  . Marital status: Single    Spouse name: Not on file  . Number of children: 0  . Years of  education: Not on file  . Highest education level: Not on file  Occupational History  . Occupation: Control and instrumentation engineer, Museum/gallery curator at [employer]  . Financial resource strain: Not on file  . Food insecurity:    Worry: Not on file    Inability: Not on file  . Transportation needs:    Medical: Not on file    Non-medical: Not on file  Tobacco Use  . Smoking status: Never Smoker  . Smokeless tobacco: Never Used  Substance and Sexual Activity  . Alcohol use: No    Alcohol/week: 0.0 standard drinks  . Drug use: No  . Sexual activity: Not Currently  Lifestyle  . Physical activity:    Days per week: Not on file    Minutes per session: Not on file  . Stress: Not on file  Relationships  . Social connections:    Talks on phone: Not on file    Gets together: Not on file    Attends religious service: Not on file    Active member of club or organization: Not on file    Attends meetings of clubs or organizations: Not on file    Relationship status: Not on file  Other Topics Concern  . Not on file  Social History Narrative   Caffeine use: soda/Pepsi   Fast food.  Regular exercise at work.   No sex.    Tobacco Counseling Counseling given: No   Clinical Intake:  Pre-visit preparation completed: Yes  Pain Score: 6      Nutritional Status: BMI > 30  Obese Nutritional Risks: None Diabetes: Yes CBG done?: No Did pt. bring in CBG monitor from home?: No  How often do you need to have someone help you when you read instructions, pamphlets, or other written materials from your doctor or pharmacy?: 1 - Never What is the last grade level you completed in school?: 12th grade  Interpreter Needed?: No  Comments: pt lives independently  Information entered by :: LPinson, LPN   Activities of Daily Living In your present state of health, do you have any difficulty performing the following activities: 06/12/2018  Hearing? N  Vision? N  Difficulty concentrating or making  decisions? N  Walking or climbing stairs? N  Dressing or bathing? N  Doing errands, shopping? N  Preparing Food and eating ? N  Using the Toilet? N  In the past six months, have you accidently leaked urine? N  Do you have problems with loss of bowel control? N  Managing your Medications? N  Managing your Finances? N  Housekeeping or managing your Housekeeping? N  Some recent data might be hidden     Immunizations and Health Maintenance Immunization History  Administered Date(s) Administered  . Influenza Split 09/14/2010  . Influenza, High Dose  Seasonal PF 10/30/2016, 09/13/2017  . Influenza,inj,Quad PF,6+ Mos 10/09/2012, 10/30/2014, 10/29/2015  . Influenza-Unspecified 10/30/2013  . Pneumococcal Conjugate-13 05/09/2016  . Pneumococcal Polysaccharide-23 10/30/2014  . Td 01/31/2000, 03/02/2009   There are no preventive care reminders to display for this patient.  Patient Care Team: Jinny Sanders, MD as PCP - General  Indicate any recent Medical Services you may have received from other than [facility] providers in the past year (date may be approximate).     Assessment:   This is a routine wellness examination for Starkisha.  Hearing/Vision screen Vision Screening Comments: Vision exam in 2019 @ [facility]  Dietary issues and exercise activities discussed: Current Exercise Habits: The patient does not participate in regular exercise at present, Exercise limited by: None identified  Goals    . Patient Stated     Starting 06/12/2018, I will continue to take medications as prescribed.       Depression Screen PHQ 2/9 Scores 06/12/2018 05/09/2016  PHQ - 2 Score 0 2  PHQ- 9 Score 0 10    Fall Risk Fall Risk  06/12/2018 05/09/2016  Falls in the past year? 0 No    Cognitive Function: MMSE - Mini Mental State Exam 06/12/2018  Orientation to time 5  Orientation to Place 5  Registration 3  Attention/ Calculation 0  Recall 3  Language- name 2 objects 0  Language- repeat  1  Language- follow 3 step command 0  Language- read & follow direction 0  Write a sentence 0  Copy design 0  Total score 17     PLEASE NOTE: A Mini-Cog screen was completed. Maximum score is 17. A value of 0 denotes this part of [place] MMSE was not completed or the patient failed this part of the Mini-Cog screening.   Mini-Cog Screening Orientation to Time - Max 5 pts Orientation to Place - Max 5 pts Registration - Max 3 pts Recall - Max 3 pts Language Repeat - Max 1 pts      Screening Tests Health Maintenance  Topic Date Due  . INFLUENZA VACCINE  08/31/2018  . HEMOGLOBIN A1C  11/23/2018  . OPHTHALMOLOGY EXAM  02/08/2019  . MAMMOGRAM  02/15/2019  . TETANUS/TDAP  03/03/2019  . FOOT EXAM  03/30/2019  . PNA vac Low Risk Adult (2 of 2 - PPSV23) 10/30/2019  . COLONOSCOPY  01/01/2021  . DEXA SCAN  Completed  . Hepatitis C Screening  Completed      Plan:     I have personally reviewed, addressed, and noted the following in the patient's chart:  A. Medical and social history B. Use of alcohol, tobacco or illicit drugs  C. Current medications and supplements D. Functional ability and status E.  Nutritional status F.  Physical activity G. Advance directives H. List of other physicians I.  Hospitalizations, surgeries, and ER visits in previous 12 months J.  Vitals (unless it is a telemedicine encounter) K. Screenings to include cognitive, depression, hearing, vision (NOTE: hearing and vision screenings not completed in telemedicine encounter) L. Referrals and appointments   In addition, I have reviewed and discussed with patient certain preventive protocols, quality metrics, and best practice recommendations. A written personalized care plan for preventive services and recommendations were provided to patient.  With patient's permission, we connected on 06/12/18 at 12:00 PM EDT by a video enabled telemedicine application. Two patient identifiers were used to ensure the  encounter occurred with the correct person.    Patient was in home and  writer was in office.   Signed,   Lindell Noe, MHA, BS, LPN Health Coach

## 2018-06-12 NOTE — Patient Instructions (Signed)
Ms. Greenly , Thank you for taking time to come for your Medicare Wellness Visit. I appreciate your ongoing commitment to your health goals. Please review the following plan we discussed and let me know if I can assist you in the future.   These are the goals we discussed: Goals    . Patient Stated     Starting 06/12/2018, I will continue to take medications as prescribed.        This is a list of the screening recommended for you and due dates:  Health Maintenance  Topic Date Due  . Flu Shot  08/31/2018  . Hemoglobin A1C  11/23/2018  . Eye exam for diabetics  02/08/2019  . Mammogram  02/15/2019  . Tetanus Vaccine  03/03/2019  . Complete foot exam   03/30/2019  . Pneumonia vaccines (2 of 2 - PPSV23) 10/30/2019  . Colon Cancer Screening  01/01/2021  . DEXA scan (bone density measurement)  Completed  .  Hepatitis C: One time screening is recommended by Center for Disease Control  (CDC) for  adults born from [birth year] through 1965.   Completed   Preventive Care for Adults  A healthy lifestyle and preventive care can promote health and wellness. Preventive health guidelines for adults include the following key practices.  . A routine yearly physical is a good way to check with your health care provider about your health and preventive screening. It is a chance to share any concerns and updates on your health and to receive a thorough exam.  . Visit your dentist for a routine exam and preventive care every 6 months. Brush your teeth twice a day and floss once a day. Good oral hygiene prevents tooth decay and gum disease.  . The frequency of eye exams is based on your age, health, family medical history, use  of contact lenses, and other factors. Follow your health care provider's recommendations for frequency of eye exams.  . Eat a healthy diet. Foods like vegetables, fruits, whole grains, low-fat dairy products, and lean protein foods contain the nutrients you need without too many calories.  Decrease your intake of foods high in solid fats, added sugars, and salt. Eat the right amount of calories for you. Get information about a proper diet from your health care provider, if necessary.  . Regular physical exercise is one of the most important things you can do for your health. Most adults should get at least 150 minutes of moderate-intensity exercise (any activity that increases your heart rate and causes you to sweat) each week. In addition, most adults need muscle-strengthening exercises on 2 or more days a week.  Silver Sneakers may be a benefit available to you. To determine eligibility, you may visit the website: www.silversneakers.com or contact program at 541-738-4719 Mon-Fri between 8AM-8PM.   . Maintain a healthy weight. The body mass index (BMI) is a screening tool to identify possible weight problems. It provides an estimate of body fat based on height and weight. Your health care provider can find your BMI and can help you achieve or maintain a healthy weight.   For adults 20 years and older: ? A BMI below 18.5 is considered underweight. ? A BMI of 18.5 to 24.9 is normal. ? A BMI of 25 to 29.9 is considered overweight. ? A BMI of 30 and above is considered obese.   . Maintain normal blood lipids and cholesterol levels by exercising and minimizing your intake of saturated fat. Eat a balanced diet with plenty  of fruit and vegetables. Blood tests for lipids and cholesterol should begin at age 43 and be repeated every 5 years. If your lipid or cholesterol levels are high, you are over 50, or you are at high risk for heart disease, you may need your cholesterol levels checked more frequently. Ongoing high lipid and cholesterol levels should be treated with medicines if diet and exercise are not working.  . If you smoke, find out from your health care provider how to quit. If you do not use tobacco, please do not start.  . If you choose to drink alcohol, please do not consume  more than 2 drinks per day. One drink is considered to be 12 ounces (355 mL) of beer, 5 ounces (148 mL) of wine, or 1.5 ounces (44 mL) of liquor.  . If you are 32-47 years old, ask your health care provider if you should take aspirin to prevent strokes.  . Use sunscreen. Apply sunscreen liberally and repeatedly throughout the day. You should seek shade when your shadow is shorter than you. Protect yourself by wearing long sleeves, pants, a wide-brimmed hat, and sunglasses year round, whenever you are outdoors.  . Once a month, do a whole body skin exam, using a mirror to look at the skin on your back. Tell your health care provider of new moles, moles that have irregular borders, moles that are larger than a pencil eraser, or moles that have changed in shape or color.

## 2018-06-12 NOTE — Progress Notes (Signed)
PCP notes:   Health maintenance:  No gaps identified  Abnormal screenings:   None  Patient concerns:   None  Nurse concerns:  None  Next PCP appt:   06/14/18 @ 1020

## 2018-06-13 ENCOUNTER — Other Ambulatory Visit: Payer: Self-pay | Admitting: Family Medicine
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
## 2018-06-14 ENCOUNTER — Other Ambulatory Visit: Payer: Self-pay

## 2018-06-14 ENCOUNTER — Ambulatory Visit (INDEPENDENT_AMBULATORY_CARE_PROVIDER_SITE_OTHER): Payer: PPO | Admitting: Family Medicine

## 2018-06-14 ENCOUNTER — Encounter: Payer: Self-pay | Admitting: Family Medicine

## 2018-06-14 VITALS — BP 140/92 | HR 85 | Temp 98.6°F | Ht 61.0 in | Wt 172.5 lb

## 2018-06-14 DIAGNOSIS — S29012A Strain of muscle and tendon of back wall of thorax, initial encounter: Secondary | ICD-10-CM | POA: Diagnosis not present
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
## 2018-06-14 NOTE — Assessment & Plan Note (Signed)
Reviewed ER notes and X-ray.  Treat with NSAIDS, massage, heat , start home PT and given time to resolve.

## 2018-06-14 NOTE — Assessment & Plan Note (Signed)
No head injury.

## 2018-06-14 NOTE — Patient Instructions (Signed)
Heat, massage, increase ibuprofen to 800 mg every 8 hours for pain and inflammation as needed.  Can continue lidoderm patches.  Start home physical therapy.

## 2018-06-16 NOTE — Progress Notes (Signed)
I reviewed health advisor's note, was available for consultation, and agree with documentation and plan.  

## 2018-06-27 ENCOUNTER — Telehealth: Payer: Self-pay
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
## 2018-06-28 NOTE — Telephone Encounter (Signed)
Left message for Dafna that prescription for massage is ready to be picked up at the front desk.

## 2018-06-28 NOTE — Telephone Encounter (Signed)
rx  In donna's box

## 2018-07-01 NOTE — Telephone Encounter (Signed)
Pt called back and I advised her of Donna's message.

## 2018-07-02 ENCOUNTER — Other Ambulatory Visit: Payer: Self-pay

## 2018-07-02 ENCOUNTER — Ambulatory Visit (INDEPENDENT_AMBULATORY_CARE_PROVIDER_SITE_OTHER): Payer: PPO | Admitting: Family Medicine

## 2018-07-02 ENCOUNTER — Encounter: Payer: Self-pay | Admitting: Family Medicine

## 2018-07-02 VITALS — BP 150/96 | HR 83 | Temp 98.9°F | Ht 60.25 in | Wt 170.8 lb

## 2018-07-02 DIAGNOSIS — Z9114 Patient's other noncompliance with medication regimen: Secondary | ICD-10-CM

## 2018-07-02 DIAGNOSIS — E1349 Other specified diabetes mellitus with other diabetic neurological complication: Secondary | ICD-10-CM | POA: Diagnosis not present

## 2018-07-02 DIAGNOSIS — E1140 Type 2 diabetes mellitus with diabetic neuropathy, unspecified: Secondary | ICD-10-CM | POA: Diagnosis not present

## 2018-07-02 DIAGNOSIS — I10 Essential (primary) hypertension: Secondary | ICD-10-CM

## 2018-07-02 DIAGNOSIS — E1365 Other specified diabetes mellitus with hyperglycemia: Secondary | ICD-10-CM

## 2018-07-02 DIAGNOSIS — Z0000 Encounter for general adult medical examination without abnormal findings: Secondary | ICD-10-CM | POA: Diagnosis not present

## 2018-07-02 DIAGNOSIS — E13319 Other specified diabetes mellitus with unspecified diabetic retinopathy without macular edema: Secondary | ICD-10-CM

## 2018-07-02 DIAGNOSIS — IMO0002 Reserved for concepts with insufficient information to code with codable children: Secondary | ICD-10-CM

## 2018-07-02 DIAGNOSIS — B372 Candidiasis of skin and nail: Secondary | ICD-10-CM | POA: Diagnosis not present

## 2018-07-02 MED ORDER — CLOTRIMAZOLE 1 % EX CREA: 1.0000 "application " | TOPICAL_CREAM | Freq: Two times a day (BID) | CUTANEOUS | 0 refills | Status: DC

## 2018-07-02 NOTE — Patient Instructions (Addendum)
Get back on medication! Take it every day. Try Bactine or other mouth wash to help with bad taste in mouth. Work on low [employer], stop soda.  Start regular exercise.

## 2018-07-02 NOTE — Assessment & Plan Note (Signed)
Stable control. Not interested in medication to treat.

## 2018-07-02 NOTE — Progress Notes (Signed)
Chief Complaint  Patient presents with  . Annual Exam    Part 2    History of Present Illness: HPI  The patient presents for  complete physical and review of chronic health problems. He/She also has the following acute concerns today:  The patient saw Candis Musa, LPN for medicare wellness. Note reviewed in detail and important notes copied below. Health maintenance: No gaps identified Abnormal screenings:  None  07/02/18   She reports her back is improving .Marland Kitchen but not back to normal.  No new numbness or weakness. Getting set up for massage.   in left skin crease of  Pannus.. minimal redness, odor,wet.. treating with vaseline.  Diabetes:   Very poor control.. noncompliant with medications, last saw endo in January, noncompliant with follow up with endo. She refuses to go back to ENDO... has not been taking insulin. " I just don't like it" "bad taste in mouth, even the insulin" Lab Results  Component Value Date   HGBA1C 13.3 (H) 05/24/2018  Using medications without difficulties: Hypoglycemic episodes: Hyperglycemic episodes: Feet problems:minimal burning in feet Blood Sugars averaging:  Not checking... not using freestyle libre( falls off arm) Looking into Dexcom meter to use on abdomen. eye exam within last year:yes Associated complications include:  Retinopathy and neuropathy  Hypertension:   Did not take BP med this AM.   BP Readings from Last 3 Encounters:  07/02/18 (!) 150/96  06/14/18 (!) 140/92  06/06/18 (!) 173/108  Using medication without problems or lightheadedness:  none Chest pain with exertion:none Edema:none Short of breath:none Average home BPs: Other issues:  [facility] at [facility] on no med .Marland KitchenMarland Kitchenrefuses statin despite recommendation. Lab Results  Component Value Date   CHOL 145 05/24/2018   HDL 39.50 05/24/2018   LDLCALC 80 05/24/2018   TRIG 130.0 05/24/2018   CHOLHDL 4 05/24/2018     COVID 19 screen No recent travel or known exposure to  COVID19 The patient denies respiratory symptoms of COVID 19 at this time.  The importance of social distancing was discussed today.   Review of Systems  Constitutional: Positive for malaise/fatigue. Negative for chills and fever.  HENT: Negative for congestion and ear pain.   Eyes: Negative for pain and redness.  Respiratory: Negative for cough and shortness of breath.   Cardiovascular: Negative for chest pain, palpitations and leg swelling.  Gastrointestinal: Negative for abdominal pain, blood in stool, constipation, diarrhea, nausea and vomiting.  Genitourinary: Negative for dysuria.  Musculoskeletal: Negative for falls and myalgias.  Skin: Negative for rash.  Neurological: Negative for dizziness.  Psychiatric/Behavioral: Negative for depression. The patient is not nervous/anxious.       Past Medical History:  Diagnosis Date  . Abdominal pain, left lower quadrant   . Acute upper respiratory infections of unspecified site   . Allergic genetic state   . Arthritis   . Glaucoma   . Lipoma of unspecified site   . Obesity   . Other and unspecified noninfectious gastroenteritis and colitis(558.9)   . Other screening mammogram   . Routine general medical examination at a health care facility   . Screening for lipoid disorders   . Special screening for osteoporosis   . Tibialis tendinitis   . Tubular adenoma of colon 03/31/2014  . Type II or unspecified type diabetes mellitus without mention of complication, not stated as uncontrolled   . Undiagnosed cardiac murmurs   . Unspecified essential hypertension     reports that she has never smoked. She has never used  smokeless tobacco. She reports that she does not drink alcohol or use drugs.   Current Outpatient Medications:  .  amLODipine (NORVASC) 10 MG tablet, Take 1 tablet (10 mg total) by mouth daily., Disp: 30 tablet, Rfl: 11 .  gabapentin (NEURONTIN) 100 MG capsule, TAKE 1 CAPSULE (100 MG TOTAL) BY MOUTH AT BEDTIME., Disp: 90  capsule, Rfl: 0 .  Insulin Detemir (LEVEMIR) 100 UNIT/ML Pen, Inject 50 Units into the skin daily., Disp: 15 mL, Rfl: 5 .  Lidocaine (HM LIDOCAINE PATCH) 4 % PTCH, Apply 1 each topically every 12 (twelve) hours as needed., Disp: , Rfl:  .  losartan (COZAAR) 50 MG tablet, Take 1 tablet (50 mg total) by mouth daily., Disp: 90 tablet, Rfl: 1 .  metFORMIN (GLUCOPHAGE-XR) 500 MG 24 hr tablet, TAKE 1 TABLET BY MOUTH EVERY DAY WITH DINNER, Disp: 90 tablet, Rfl: 1 .  Continuous Blood Gluc Sensor (FREESTYLE LIBRE 14 DAY SENSOR) MISC, 1 Units by Does not apply route every 3 (three) days as needed. (Patient not taking: Reported on 06/14/2018), Disp: 4 each, Rfl: 11 .  glucose monitoring kit (FREESTYLE) monitoring kit, 1 each by Does not apply route as needed for other. (Patient not taking: Reported on 07/02/2018), Disp: 1 each, Rfl: 0   Observations/Objective: Blood pressure (!) 150/96, pulse 83, temperature 98.9 F (37.2 C), temperature source Oral, height 5' 0.25" (1.53 m), weight 170 lb 12 oz (77.5 kg).  Physical Exam Constitutional:      General: She is not in acute distress.    Appearance: Normal appearance. She is well-developed. She is not ill-appearing or toxic-appearing.  HENT:     Head: Normocephalic.     Right Ear: Hearing, tympanic membrane, ear canal and external ear normal.     Left Ear: Hearing, tympanic membrane, ear canal and external ear normal.     Nose: Nose normal.  Eyes:     General: Lids are normal. Lids are everted, no foreign bodies appreciated.     Conjunctiva/sclera: Conjunctivae normal.     Pupils: Pupils are equal, round, and reactive to light.  Neck:     Musculoskeletal: Normal range of motion and neck supple.     Thyroid: No thyroid mass or thyromegaly.     Vascular: No carotid bruit.     Trachea: Trachea normal.  Cardiovascular:     Rate and Rhythm: Normal rate and regular rhythm.     Heart sounds: Normal heart sounds, S1 normal and S2 normal. No murmur. No gallop.    Pulmonary:     Effort: Pulmonary effort is normal. No respiratory distress.     Breath sounds: Normal breath sounds. No wheezing, rhonchi or rales.  Abdominal:     General: Bowel sounds are normal. There is no distension or abdominal bruit.     Palpations: Abdomen is soft. There is no fluid wave or mass.     Tenderness: There is no abdominal tenderness. There is no guarding or rebound.     Hernia: No hernia is present.  Lymphadenopathy:     Cervical: No cervical adenopathy.  Skin:    General: Skin is warm and dry.     Findings: No rash.     Comments: moist erythema and hyperpigmented skin in left pannus crease  Neurological:     Mental Status: She is alert.     Cranial Nerves: No cranial nerve deficit.     Sensory: No sensory deficit.  Psychiatric:        Mood and Affect:  Mood is not anxious or depressed.        Speech: Speech normal.        Behavior: Behavior normal. Behavior is cooperative.        Judgment: Judgment normal.      Assessment and Plan DM (diabetes mellitus), secondary, uncontrolled, with neurologic complications (HCC) Poor control.  Recommended her to go back to see ENDO.. refused. Her issue is compliance.. discuss mouth washes to remove bad tast in her mouth )( she states even the insulin causes a bad taste in her mouth) Offered referral back to  Nutritionist.. change to a different medication. She finds a way to avoid most my suggestions to improve DM control. Reviewed longterm complications including MI and CVA from DM. She acknowledge the risk of her noncompliance.  Diabetic retinopathy Followed by eye md.  Essential hypertension, benign Poor control as not taking BP med. When she takes medication BP is well controlled.  Neuropathy due to type 2 diabetes mellitus Stable control. Not interested in medication to treat.  Candidal intertrigo Treat with clotrimazole cream. If not improving.. change to nystatin. Encouraged better glucose control.    The  patient's preventative maintenance and recommended screening tests for an annual wellness exam were reviewed in full today. Brought up to date unless services declined.  Counselled on the importance of diet, exercise, and its role in overall health and mortality. The patient's FH and SH was reviewed, including their home life, tobacco status, and drug and alcohol status.   Vaccine:Uptodate with Td and flu,prevnar. PAP/DVE: TAH, not indicated.  Mammo:01/2017, DBE not indicated DEXA; 2018 nml, repeat due in 5 years Colon: 12/2017 hx of adenoma... Repeat in 3 years.  Dr. Gustavo Lah Nonsmoker Refused HIV Hep C: done  Eliezer Lofts, MD

## 2018-07-02 NOTE — Assessment & Plan Note (Signed)
Poor control as not taking BP med. When she takes medication BP is well controlled.

## 2018-07-02 NOTE — Assessment & Plan Note (Signed)
Followed by eye md.

## 2018-07-02 NOTE — Assessment & Plan Note (Signed)
Poor control.  Recommended her to go back to see ENDO.. refused. Her issue is compliance.. discuss mouth washes to remove bad tast in her mouth )( she states even the insulin causes a bad taste in her mouth) Offered referral back to  Nutritionist.. change to a different medication. She finds a way to avoid most my suggestions to improve DM control. Reviewed longterm complications including MI and CVA from DM. She acknowledge the risk of her noncompliance.

## 2018-07-02 NOTE — Assessment & Plan Note (Signed)
Treat with clotrimazole cream. If not improving.. change to nystatin. Encouraged better glucose control.

## 2018-08-07 ENCOUNTER — Telehealth: Payer: Self-pay

## 2018-08-07 NOTE — Telephone Encounter (Signed)
For few wks pt said nothing taste normal. Pt ate salmon the other day and could barely smell or taste the salmon. Last night pt had chicken pie and green beans and pt could not tell the difference of taste between the pie and the beans. Pt has dry cough on and off for a long time; pt previously told allergies.no problem with throat swelling or swallowing; no indigestion and no N&V.pt does not think she is having muscle pain but is not sure if muscle or joint pain.no other coviod symptoms,no travel and no known exposure to + covid. 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 Pt scheduled virtual visit 08/08/18 at 11:40 with Dr Diona Browner.

## 2018-08-08 ENCOUNTER — Ambulatory Visit (INDEPENDENT_AMBULATORY_CARE_PROVIDER_SITE_OTHER): Payer: PPO | Admitting: Family Medicine

## 2018-08-08 ENCOUNTER — Encounter: Payer: Self-pay | Admitting: Family Medicine

## 2018-08-08 DIAGNOSIS — R432 Parageusia: Secondary | ICD-10-CM | POA: Diagnosis not present

## 2018-08-08 MED ORDER — MAGIC MOUTHWASH: 5.0000 mL | Freq: Three times a day (TID) | ORAL | 1 refills | Status: DC | PRN

## 2018-08-08 NOTE — Assessment & Plan Note (Signed)
No new med SE.  Pt states she has had before and improved with nystatin treatment  ( magic mouthwash) despite not typical  Thrush symptoms.  Possible covid19.Marland Kitchen offered testing, pt refused. Pt instructed to home isolate, not to go to work  unitl tested or symptoms resolved with treatment plan. Pt voiced understanding.

## 2018-08-08 NOTE — Telephone Encounter (Signed)
Noted  

## 2018-08-08 NOTE — Progress Notes (Signed)
VIRTUAL VISIT Due to national recommendations of social distancing due to [place] 19, a virtual visit is felt to be most appropriate for this patient at this time.   Interactive audio and video telecommunications were attempted between this provider and patient, however failed, due to patient having technical difficulties OR patient did not have access to video capability.  We continued and completed visit with audio only.    I connected with the patient on 08/08/18 at 11:40 AM EDT by virtual telehealth platform and verified that I am speaking with the correct person using two identifiers.   I discussed the limitations, risks, security and privacy concerns of performing an evaluation and management service by  virtual telehealth platform and the availability of in person appointments. I also discussed with the patient that there may be a patient responsible charge related to this service. The patient expressed understanding and agreed to proceed.  Patient location: Home Provider Location: [place] Participants: Eliezer Lofts and [name]   Chief Complaint  Patient presents with  . No taste when eating    History of Present Illness: 68 year old female with poorly controlled DM and HTN, noncompliant with meds presents with new onset loss of taste in last month.  She has not white plaque in mouth, no burning in mouth  She denies fever, occ cough ( feels from allergies) , no ST, no congestion, no diarrhea. No loss of smells.  She feels she had this before... magic mouthwash She has not been taking her meds.  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 so has COVID positive contacts although wears a mask at work and patients are masked.  Review of Systems  Constitutional: Negative for chills and fever.  HENT: Negative for congestion and ear pain.   Eyes: Negative for pain and redness.  Respiratory: Negative for cough and shortness of breath.   Cardiovascular:  Negative for chest pain, palpitations and leg swelling.  Gastrointestinal: Negative for abdominal pain, blood in stool, constipation, diarrhea, nausea and vomiting.  Genitourinary: Negative for dysuria.  Musculoskeletal: Negative for falls and myalgias.  Skin: Negative for rash.  Neurological: Negative for dizziness.  Psychiatric/Behavioral: Negative for depression. The patient is not nervous/anxious.       Past Medical History:  Diagnosis Date  . Abdominal pain, left lower quadrant   . Acute upper respiratory infections of unspecified site   . Allergic genetic state   . Arthritis   . Glaucoma   . Lipoma of unspecified site   . Obesity   . Other and unspecified noninfectious gastroenteritis and colitis(558.9)   . Other screening mammogram   . Routine general medical examination at a health care facility   . Screening for lipoid disorders   . Special screening for osteoporosis   . Tibialis tendinitis   . Tubular adenoma of colon 03/31/2014  . Type II or unspecified type diabetes mellitus without mention of complication, not stated as uncontrolled   . Undiagnosed cardiac murmurs   . Unspecified essential hypertension     reports that she has never smoked. She has never used smokeless tobacco. She reports that she does not drink alcohol or use drugs.   Current Outpatient Medications:  .  amLODipine (NORVASC) 10 MG tablet, Take 1 tablet (10 mg total) by mouth daily. (Patient not taking: Reported on 08/08/2018), Disp: 30 tablet, Rfl: 11 .  clotrimazole (CLOTRIMAZOLE AF) 1 % cream, Apply 1 application topically 2 (two) times daily. (Patient not taking:  Reported on 08/08/2018), Disp: 30 g, Rfl: 0 .  Continuous Blood Gluc Sensor (FREESTYLE LIBRE 14 DAY SENSOR) MISC, 1 Units by Does not apply route every 3 (three) days as needed. (Patient not taking: Reported on 06/14/2018), Disp: 4 each, Rfl: 11 .  gabapentin (NEURONTIN) 100 MG capsule, TAKE 1 CAPSULE (100 MG TOTAL) BY MOUTH AT BEDTIME.  (Patient not taking: Reported on 08/08/2018), Disp: 90 capsule, Rfl: 0 .  glucose monitoring kit (FREESTYLE) monitoring kit, 1 each by Does not apply route as needed for other. (Patient not taking: Reported on 07/02/2018), Disp: 1 each, Rfl: 0 .  Insulin Detemir (LEVEMIR) 100 UNIT/ML Pen, Inject 50 Units into the skin daily. (Patient not taking: Reported on 08/08/2018), Disp: 15 mL, Rfl: 5 .  Lidocaine (HM LIDOCAINE PATCH) 4 % PTCH, Apply 1 each topically every 12 (twelve) hours as needed., Disp: , Rfl:  .  losartan (COZAAR) 50 MG tablet, Take 1 tablet (50 mg total) by mouth daily. (Patient not taking: Reported on 08/08/2018), Disp: 90 tablet, Rfl: 1 .  metFORMIN (GLUCOPHAGE-XR) 500 MG 24 hr tablet, TAKE 1 TABLET BY MOUTH EVERY DAY WITH DINNER (Patient not taking: Reported on 08/08/2018), Disp: 90 tablet, Rfl: 1   Observations/Objective: Height 5' 0.25" (1.53 m).  Physical Exam Physical Exam Constitutional:      General: The patient is not in acute distress. Pulmonary:     Effort: Pulmonary effort is normal. No respiratory distress.  Neurological:     Mental Status: The patient is alert and oriented to person, place, and time.  Psychiatric:        Mood and Affect: Mood normal.        Behavior: Behavior normal.   Assessment and Plan Total visit time 10 minutes, > 50% spent counseling and cordinating patients care.  Loss of taste No new med SE.  Pt states she has had before and improved with nystatin treatment  ( magic mouthwash) despite not typical  Thrush symptoms.  Possible covid19.Marland Kitchen offered testing, pt refused. Pt instructed to home isolate, not to go to work  unitl tested or symptoms resolved with treatment plan. Pt voiced understanding.     I discussed the assessment and treatment plan with the patient. The patient was provided an opportunity to ask questions and all were answered. The patient agreed with the plan and demonstrated an understanding of the instructions.   The patient was  advised to call back or seek an in-person evaluation if the symptoms worsen or if the condition fails to improve as anticipated.     Eliezer Lofts, MD

## 2018-08-08 NOTE — Progress Notes (Signed)
Rx faxed to [facility] [address] [address] at [phone].

## 2018-08-12 ENCOUNTER — Telehealth: Payer: Self-pay

## 2018-08-12 NOTE — Telephone Encounter (Signed)
Pt had visit on 08/08/18 and magic mouthwash is not at [address].Please advise. Pt request cb.

## 2018-08-12 NOTE — Telephone Encounter (Signed)
Patient stated that mouth wash was not received by her pharmacy and would like it sent in. Patient stated that she is wondering if she should ahead and be tested for Covid? Patient stated that she has had the problem before with not being able to taste. Patient requested for Covid test be ordered if Dr. Diona Browner thinks that she should be tested.

## 2018-08-12 NOTE — Telephone Encounter (Signed)
Patient called back in regards to her mouth wash being sent.    [facility] C/B # [phone]

## 2018-08-13 ENCOUNTER — Telehealth: Payer: Self-pay | Admitting: Family Medicine

## 2018-08-13 MED ORDER — MAGIC MOUTHWASH: 5.0000 mL | Freq: Three times a day (TID) | ORAL | 1 refills | Status: DC | PRN

## 2018-08-13 NOTE — Telephone Encounter (Signed)
Pt aware of rx being faxed over pt states that she will try this out and if things don't get better she will call.

## 2018-08-13 NOTE — Telephone Encounter (Signed)
Let pt know they will call her in 24 hour to set up the covid test. I have requested the test.

## 2018-08-13 NOTE — Telephone Encounter (Signed)
Pt request cb today with update on magic mouthwash request.

## 2018-08-13 NOTE — Telephone Encounter (Signed)
Covid test ordered

## 2018-08-13 NOTE — Telephone Encounter (Signed)
Called pt to schedule covid testing as requested by Dr. Diona Browner. Pt declines. States feeling better, she will CB if she changes her mind.

## 2018-08-13 NOTE — Telephone Encounter (Signed)
RX for Mouth wash was prescribed on her last visit but it was printed instead of sent. I have sent that in. Please review additional comments from patient below thank you.

## 2018-08-13 NOTE — Telephone Encounter (Signed)
Called pt to schedule covid testing; declines.    States feeling better. If she changes her mind, states she will call back.

## 2018-08-14 ENCOUNTER — Telehealth: Payer: Self-pay | Admitting: Family Medicine

## 2018-08-14 NOTE — Telephone Encounter (Signed)
Pt came into office stating she wants Dexcom monitoring system. [employer] will approve. She said you need to go through [employer] portal to get it ordered. Please advise.  CB [phone]

## 2018-08-14 NOTE — Telephone Encounter (Signed)
I am not aware how to do this.. please look into it.

## 2018-08-14 NOTE — Telephone Encounter (Signed)
Pt said that mouthwash is not at [place]. I called and spoke with Ilona Sorrel at [employer] [facility] and he said did not receive fax for mouthwash. Gave verbal order for mouthwash per instructions and Ilona Sorrel said would be ready for pick up in 1 hr. Pt voiced understanding.

## 2018-08-15 NOTE — Telephone Encounter (Signed)
Left message on voicemail for patient to call back. 

## 2018-08-16 NOTE — Telephone Encounter (Signed)
Spoke to patient and was advised that the insurance company told her they would approve it. Advised patient that we are not familiar with the protal that they are speaking of. Patient stated if Dr. Diona Browner would just give her a written prescription for the Dexcom monitoring system she will work with the insurance company to get it filled. Patient also wanted Dr. Diona Browner to know that she was not given directions on how to use the mouth wash and ask the pharmacist and she was told to contact her doctor. Patient stated that she goggled it and the directions was to swish and spit out and that is what she has been doing.

## 2018-08-16 NOTE — Telephone Encounter (Signed)
Patient returned call   C/B # [phone]

## 2018-08-16 NOTE — Telephone Encounter (Signed)
Swish and spit 5 ml  4 times daily... magic mouthwash directions.  Rx for monitor in outbox.

## 2018-08-16 NOTE — Telephone Encounter (Signed)
Patient advised and RX for monitor placed up front for pick up

## 2018-08-30 ENCOUNTER — Encounter: Payer: Self-pay | Admitting: Family Medicine

## 2018-08-30 ENCOUNTER — Other Ambulatory Visit: Payer: Self-pay

## 2018-08-30 ENCOUNTER — Ambulatory Visit (INDEPENDENT_AMBULATORY_CARE_PROVIDER_SITE_OTHER): Payer: PPO | Admitting: Family Medicine

## 2018-08-30 DIAGNOSIS — S29012A Strain of muscle and tendon of back wall of thorax, initial encounter: Secondary | ICD-10-CM

## 2018-08-30 NOTE — Assessment & Plan Note (Signed)
iff and on. None on exam today. Start home stretching ,can use aleve prn shortterm. Heat.
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
## 2018-08-30 NOTE — Patient Instructions (Signed)
Can use aleve prn.  Start home stretching and heat on mid back.  Massage okay.

## 2018-09-11 ENCOUNTER — Telehealth: Payer: Self-pay | Admitting: *Deleted

## 2018-09-11 NOTE — Telephone Encounter (Signed)
Patient called stating that she was seen last week because of back pain. Patient stated that she has taken Aleve for 2-3 days. Patient is wondering if she is having acid reflux now because she is burning down in her throat when she eats?  Patient stated that she feels so bad and does not feel like doing the exercises. Patient stated that she has done some of the exercises and they don't seem to be helping. She is wondering if her back pain and burning down her throat could be connected? Patient wants to know what Dr. Diona Browner would recommend that she do next?

## 2018-09-13 NOTE — Telephone Encounter (Signed)
Left detailed message for Patricia Horne to stop aleve.  Start omeprazole 2 tabs of 20 mg daily OTC for the next 2 weeks... if stomach issue not better follow up.   Back pain did not seem to be GI in origin at last OV. Advised to start home exercises to stretch..if not improving will refer to PT.

## 2018-09-13 NOTE — Telephone Encounter (Signed)
Stop aleve.  Start omeprazole 2 tabs of 20 mg daily OTC for  Next 2 weeks... if stomach issue not better follow up.  Back pain did not seem to be GI in origin at last OV.  Start home exercises to stretch..if not improving will refer to PT.

## 2018-09-23 ENCOUNTER — Telehealth: Payer: Self-pay | Admitting: Family Medicine

## 2018-09-23 DIAGNOSIS — E1349 Other specified diabetes mellitus with other diabetic neurological complication: Secondary | ICD-10-CM

## 2018-09-23 DIAGNOSIS — IMO0002 Reserved for concepts with insufficient information to code with codable children: Secondary | ICD-10-CM

## 2018-09-23 NOTE — Telephone Encounter (Signed)
-----   Message from Ellamae Sia sent at 09/17/2018 10:03 AM EDT ----- Regarding: Lab orders for Tuesday, 8.25.20 Lab orders for f/u appt

## 2018-09-24 ENCOUNTER — Other Ambulatory Visit (INDEPENDENT_AMBULATORY_CARE_PROVIDER_SITE_OTHER): Payer: PPO

## 2018-09-24 DIAGNOSIS — E1349 Other specified diabetes mellitus with other diabetic neurological complication: Secondary | ICD-10-CM

## 2018-09-24 DIAGNOSIS — E1365 Other specified diabetes mellitus with hyperglycemia: Secondary | ICD-10-CM

## 2018-09-24 DIAGNOSIS — IMO0002 Reserved for concepts with insufficient information to code with codable children: Secondary | ICD-10-CM

## 2018-09-24 LAB — COMPREHENSIVE METABOLIC PANEL
ALT: 13 U/L (ref 0–35)
AST: 12 U/L (ref 0–37)
Albumin: 3.9 g/dL (ref 3.5–5.2)
Alkaline Phosphatase: 104 U/L (ref 39–117)
BUN: 21 mg/dL (ref 6–23)
CO2: 29 mEq/L (ref 19–32)
Calcium: 9.1 mg/dL (ref 8.4–10.5)
Chloride: 102 mEq/L (ref 96–112)
Creatinine, Ser: 0.84 mg/dL (ref 0.40–1.20)
GFR: 81.57 mL/min (ref >60.00)
Glucose, Bld: 350 mg/dL — ABNORMAL HIGH (ref 70–99)
Potassium: 4.2 mEq/L (ref 3.5–5.1)
Sodium: 137 mEq/L (ref 135–145)
Total Bilirubin: 0.5 mg/dL (ref 0.2–1.2)
Total Protein: 7.1 g/dL (ref 6.0–8.3)

## 2018-09-24 LAB — LIPID PANEL
Cholesterol: 142 mg/dL (ref 0–200)
HDL: 41.7 mg/dL (ref >39.00)
LDL Cholesterol: 68 mg/dL (ref 0–99)
NonHDL: 100.71
Total CHOL/HDL Ratio: 3
Triglycerides: 164 mg/dL — ABNORMAL HIGH (ref 0.0–149.0)
VLDL: 32.8 mg/dL (ref 0.0–40.0)

## 2018-09-24 LAB — HEMOGLOBIN A1C: Hgb A1c MFr Bld: 13.1 % — ABNORMAL HIGH (ref 4.6–6.5)

## 2018-09-24 NOTE — Progress Notes (Signed)
No critical labs need to be addressed urgently. We will discuss labs in detail at upcoming office visit.   

## 2018-10-01 ENCOUNTER — Ambulatory Visit (INDEPENDENT_AMBULATORY_CARE_PROVIDER_SITE_OTHER): Payer: PPO | Admitting: Family Medicine

## 2018-10-01 ENCOUNTER — Ambulatory Visit: Payer: PPO | Admitting: Family Medicine

## 2018-10-01 ENCOUNTER — Other Ambulatory Visit: Payer: Self-pay

## 2018-10-01 ENCOUNTER — Encounter: Payer: Self-pay | Admitting: Family Medicine

## 2018-10-01 VITALS — BP 160/100 | HR 98 | Temp 98.4°F | Ht 60.25 in | Wt 172.5 lb

## 2018-10-01 DIAGNOSIS — R109 Unspecified abdominal pain: Secondary | ICD-10-CM | POA: Diagnosis not present

## 2018-10-01 DIAGNOSIS — S29012A Strain of muscle and tendon of back wall of thorax, initial encounter: Secondary | ICD-10-CM

## 2018-10-01 DIAGNOSIS — I10 Essential (primary) hypertension: Secondary | ICD-10-CM

## 2018-10-01 DIAGNOSIS — E1140 Type 2 diabetes mellitus with diabetic neuropathy, unspecified: Secondary | ICD-10-CM | POA: Diagnosis not present

## 2018-10-01 DIAGNOSIS — Z23 Encounter for immunization: Secondary | ICD-10-CM | POA: Diagnosis not present

## 2018-10-01 DIAGNOSIS — E13319 Other specified diabetes mellitus with unspecified diabetic retinopathy without macular edema: Secondary | ICD-10-CM

## 2018-10-01 DIAGNOSIS — E1365 Other specified diabetes mellitus with hyperglycemia: Secondary | ICD-10-CM

## 2018-10-01 DIAGNOSIS — G8929 Other chronic pain: Secondary | ICD-10-CM | POA: Diagnosis not present

## 2018-10-01 DIAGNOSIS — IMO0002 Reserved for concepts with insufficient information to code with codable children: Secondary | ICD-10-CM

## 2018-10-01 DIAGNOSIS — K5909 Other constipation: Secondary | ICD-10-CM

## 2018-10-01 DIAGNOSIS — R1013 Epigastric pain: Secondary | ICD-10-CM

## 2018-10-01 DIAGNOSIS — E1349 Other specified diabetes mellitus with other diabetic neurological complication: Secondary | ICD-10-CM

## 2018-10-01 LAB — LIPASE: Lipase: 19 U/L (ref 11.0–59.0)

## 2018-10-01 MED ORDER — TRULICITY 0.75 MG/0.5ML ~~LOC~~ SOAJ: 0.7500 mg | SUBCUTANEOUS | 11 refills | Status: DC

## 2018-10-01 MED ORDER — PEN NEEDLES 31G X 8 MM MISC: 11 refills | Status: DC

## 2018-10-01 NOTE — Assessment & Plan Note (Signed)
Poor control. Pt now willing to start trulicity weekly. Once epigastric pain improved will have her start/. Close follow up in 2 weeks for eval of blood sugars

## 2018-10-01 NOTE — Assessment & Plan Note (Signed)
Patricia Horne has Diabetic gastroparesis but refuses to see GI and follow their recommendations.

## 2018-10-01 NOTE — Assessment & Plan Note (Signed)
Poor control.. pt states she will take her medicaiton

## 2018-10-01 NOTE — Progress Notes (Signed)
Chief Complaint  Patient presents with  . Diabetes    History of Present Illness: HPI   68 year old female presents for follow up DM.. she is non compliant with her medication.    Mild GERD, frequent burping and mid back pain. No abd pain. Only used prilosec  20 mg  She continues to be constipated. Using fiber.  Saw Dr. Gustavo Lah in 10/2017  She did not want to take the medication recommended.  Diabetes:   Lab Results  Component Value Date   HGBA1C 13.1 (H) 09/24/2018  Using medications without difficulties: Hypoglycemic episodes: Hyperglycemic episodes: Feet problems:no ulcers Blood Sugars averaging: eye exam within last year:01/2018  HTN: poor control. Not taking her medication.  Elevated Cholesterol: At goal on no med. Statin indicated but pt refuses. Using medications without problems: Muscle aches:  Diet compliance: poor.. lots of poatatos Exercise:minimal Other complaints:  COVID 19 screen No recent travel or known exposure to [place] The patient denies respiratory symptoms of COVID 19 at this time.  The importance of social distancing was discussed today.   Review of Systems  Constitutional: Negative for chills and fever.  HENT: Negative for congestion and ear pain.   Eyes: Negative for pain and redness.  Respiratory: Negative for cough and shortness of breath.   Cardiovascular: Negative for chest pain, palpitations and leg swelling.  Gastrointestinal: Positive for constipation, heartburn and nausea. Negative for abdominal pain, blood in stool, diarrhea and vomiting.  Genitourinary: Negative for dysuria.  Musculoskeletal: Negative for falls and myalgias.  Skin: Negative for rash.  Neurological: Negative for dizziness.  Psychiatric/Behavioral: Negative for depression. The patient is not nervous/anxious.       Past Medical History:  Diagnosis Date  . Abdominal pain, left lower quadrant   . Acute upper respiratory infections of unspecified site   .  Allergic genetic state   . Arthritis   . Glaucoma   . Lipoma of unspecified site   . Obesity   . Other and unspecified noninfectious gastroenteritis and colitis(558.9)   . Other screening mammogram   . Routine general medical examination at a health care facility   . Screening for lipoid disorders   . Special screening for osteoporosis   . Tibialis tendinitis   . Tubular adenoma of colon 03/31/2014  . Type II or unspecified type diabetes mellitus without mention of complication, not stated as uncontrolled   . Undiagnosed cardiac murmurs   . Unspecified essential hypertension     reports that she has never smoked. She has never used smokeless tobacco. She reports that she does not drink alcohol or use drugs.   Current Outpatient Medications:  .  amLODipine (NORVASC) 10 MG tablet, Take 1 tablet (10 mg total) by mouth daily. (Patient not taking: Reported on 08/08/2018), Disp: 30 tablet, Rfl: 11 .  clotrimazole (CLOTRIMAZOLE AF) 1 % cream, Apply 1 application topically 2 (two) times daily. (Patient not taking: Reported on 08/08/2018), Disp: 30 g, Rfl: 0 .  Continuous Blood Gluc Sensor (FREESTYLE LIBRE 14 DAY SENSOR) MISC, 1 Units by Does not apply route every 3 (three) days as needed. (Patient not taking: Reported on 06/14/2018), Disp: 4 each, Rfl: 11 .  gabapentin (NEURONTIN) 100 MG capsule, TAKE 1 CAPSULE (100 MG TOTAL) BY MOUTH AT BEDTIME. (Patient not taking: Reported on 08/08/2018), Disp: 90 capsule, Rfl: 0 .  glucose monitoring kit (FREESTYLE) monitoring kit, 1 each by Does not apply route as needed for other. (Patient not taking: Reported on 07/02/2018), Disp: 1 each,  Rfl: 0 .  Insulin Detemir (LEVEMIR) 100 UNIT/ML Pen, Inject 50 Units into the skin daily. (Patient not taking: Reported on 08/08/2018), Disp: 15 mL, Rfl: 5 .  Lidocaine (HM LIDOCAINE PATCH) 4 % PTCH, Apply 1 each topically every 12 (twelve) hours as needed., Disp: , Rfl:  .  losartan (COZAAR) 50 MG tablet, Take 1 tablet (50 mg total)  by mouth daily. (Patient not taking: Reported on 08/08/2018), Disp: 90 tablet, Rfl: 1 .  metFORMIN (GLUCOPHAGE-XR) 500 MG 24 hr tablet, TAKE 1 TABLET BY MOUTH EVERY DAY WITH DINNER (Patient not taking: Reported on 08/08/2018), Disp: 90 tablet, Rfl: 1   Observations/Objective: Blood pressure (!) 160/100, pulse 98, temperature 98.4 F (36.9 C), temperature source Temporal, height 5' 0.25" (1.53 m), weight 172 lb 8 oz (78.2 kg), SpO2 97 %.  Physical Exam Constitutional:      General: She is not in acute distress.    Appearance: Normal appearance. She is well-developed. She is not ill-appearing or toxic-appearing.  HENT:     Head: Normocephalic.     Right Ear: Hearing, tympanic membrane, ear canal and external ear normal. Tympanic membrane is not erythematous, retracted or bulging.     Left Ear: Hearing, tympanic membrane, ear canal and external ear normal. Tympanic membrane is not erythematous, retracted or bulging.     Nose: No mucosal edema or rhinorrhea.     Right Sinus: No maxillary sinus tenderness or frontal sinus tenderness.     Left Sinus: No maxillary sinus tenderness or frontal sinus tenderness.     Mouth/Throat:     Pharynx: Uvula midline.  Eyes:     General: Lids are normal. Lids are everted, no foreign bodies appreciated.     Conjunctiva/sclera: Conjunctivae normal.     Pupils: Pupils are equal, round, and reactive to light.  Neck:     Musculoskeletal: Normal range of motion and neck supple.     Thyroid: No thyroid mass or thyromegaly.     Vascular: No carotid bruit.     Trachea: Trachea normal.  Cardiovascular:     Rate and Rhythm: Normal rate and regular rhythm.     Pulses: Normal pulses.     Heart sounds: Normal heart sounds, S1 normal and S2 normal. No murmur. No friction rub. No gallop.   Pulmonary:     Effort: Pulmonary effort is normal. No tachypnea or respiratory distress.     Breath sounds: Normal breath sounds. No decreased breath sounds, wheezing, rhonchi or rales.   Abdominal:     General: Bowel sounds are normal.     Palpations: Abdomen is soft. There is no fluid wave.     Tenderness: There is abdominal tenderness in the epigastric area.  Skin:    General: Skin is warm and dry.     Findings: No rash.  Neurological:     Mental Status: She is alert.  Psychiatric:        Mood and Affect: Mood is not anxious or depressed.        Speech: Speech normal.        Behavior: Behavior normal. Behavior is cooperative.        Thought Content: Thought content normal.        Judgment: Judgment normal.      Assessment and Plan DM (diabetes mellitus), secondary, uncontrolled, with neurologic complications (HCC) Poor control. Pt now willing to start trulicity weekly. Once epigastric pain improved will have her start/. Close follow up in 2 weeks for eval of  blood sugars  Neuropathy due to type 2 diabetes mellitus Stable not currently bothersome  Strain of thoracic back region Improves with tylenol temporarily. Now off NSAIDs.  MAy be connected to  abd soreness  Essential hypertension, benign Poor control.. pt states she will take her medicaiton  Diabetic retinopathy  Followed by eye MD. Needs better DM control.  Chronic abdominal pain  Given recent increase in GERD.Marland Kitchen trial of omeprazole. Check lipase to make sure no atypical pancreatitis. [name] has Diabetic gastroparesis but refuses to see GI and follow their recommendations.  Constipation, chronic [name] has Diabetic gastroparesis but refuses to see GI and follow their recommendations.       Eliezer Lofts, MD

## 2018-10-01 NOTE — Assessment & Plan Note (Signed)
Improves with tylenol temporarily. Now off NSAIDs.  MAy be connected to  abd soreness

## 2018-10-01 NOTE — Assessment & Plan Note (Signed)
Stable not currently bothersome

## 2018-10-01 NOTE — Patient Instructions (Addendum)
Start omeprazole 2 tabs of 20 mg daily OTC for next 4 weeks Call if not improving after 4 weeks.  Use tylenol for any back pain.. not aleve.  Start trulicity weekly. Take you blood pressure medication every day .. if you do not take it you will have a stroke!  Please stop at the lab to have labs drawn.

## 2018-10-01 NOTE — Assessment & Plan Note (Addendum)
Given recent increase in GERD.Patricia Horne trial of omeprazole. Check lipase to make sure no atypical pancreatitis. [name] has Diabetic gastroparesis but refuses to see GI and follow their recommendations.

## 2018-10-01 NOTE — Assessment & Plan Note (Signed)
Followed by eye MD. Needs better DM control.

## 2018-10-17 ENCOUNTER — Ambulatory Visit: Payer: PPO | Admitting: Family Medicine

## 2018-10-22 ENCOUNTER — Ambulatory Visit: Payer: PPO | Admitting: Family Medicine

## 2018-10-29 ENCOUNTER — Encounter: Payer: Self-pay | Admitting: Family Medicine

## 2018-10-29 ENCOUNTER — Other Ambulatory Visit: Payer: Self-pay

## 2018-10-29 ENCOUNTER — Ambulatory Visit (INDEPENDENT_AMBULATORY_CARE_PROVIDER_SITE_OTHER): Payer: PPO | Admitting: Family Medicine

## 2018-10-29 DIAGNOSIS — E1365 Other specified diabetes mellitus with hyperglycemia: Secondary | ICD-10-CM

## 2018-10-29 DIAGNOSIS — I10 Essential (primary) hypertension: Secondary | ICD-10-CM

## 2018-10-29 DIAGNOSIS — E1349 Other specified diabetes mellitus with other diabetic neurological complication: Secondary | ICD-10-CM | POA: Diagnosis not present

## 2018-10-29 DIAGNOSIS — IMO0002 Reserved for concepts with insufficient information to code with codable children: Secondary | ICD-10-CM

## 2018-10-29 MED ORDER — TRULICITY 1.5 MG/0.5ML ~~LOC~~ SOAJ: 1.5000 mg | SUBCUTANEOUS | 11 refills | Status: DC

## 2018-10-29 MED ORDER — FREESTYLE LIBRE 14 DAY SENSOR MISC: 1.0000 [IU] | 11 refills | Status: DC | PRN

## 2018-10-29 MED ORDER — FREESTYLE SYSTEM KIT: 1.0000 | PACK | 0 refills | Status: DC | PRN

## 2018-10-29 MED ORDER — ESOMEPRAZOLE MAGNESIUM 40 MG PO CPDR: 40.0000 mg | DELAYED_RELEASE_CAPSULE | Freq: Every day | ORAL | 11 refills | Status: DC

## 2018-10-29 NOTE — Progress Notes (Signed)
Chief Complaint  Patient presents with  . Diabetes    History of Present Illness: HPI  40 noncompliant female presents for follow up DM  Diabetes:   Now started Dulaglutide. Weekly... she is tolerating it well.  Not taking metformin.  No SE. Lab Results  Component Value Date   HGBA1C 13.1 (H) 09/24/2018  Using medications without difficulties: Hypoglycemic episodes: Hyperglycemic episodes: Feet problems:no ulcers Blood Sugars averaging: not checking .Marland Kitchen need new freestyle libre. eye exam within last year: Wt Readings from Last 3 Encounters:  10/29/18 172 lb 12 oz (78.4 kg)  10/01/18 172 lb 8 oz (78.2 kg)  08/30/18 175 lb (79.4 kg)      She has been taking her BP medication! Well controlled.  BP Readings from Last 3 Encounters:  10/29/18 120/66  10/01/18 (!) 160/100  08/30/18 (!) 180/90     COVID 19 screen No recent travel or known exposure to [place] The patient denies respiratory symptoms of COVID 19 at this time.  The importance of social distancing was discussed today.   Review of Systems  Constitutional: Negative for chills and fever.  HENT: Negative for congestion and ear pain.   Eyes: Negative for pain and redness.  Respiratory: Negative for cough and shortness of breath.   Cardiovascular: Negative for chest pain, palpitations and leg swelling.  Gastrointestinal: Negative for abdominal pain, blood in stool, constipation, diarrhea, nausea and vomiting.  Genitourinary: Negative for dysuria.  Musculoskeletal: Negative for falls and myalgias.  Skin: Negative for rash.  Neurological: Negative for dizziness.  Psychiatric/Behavioral: Negative for depression. The patient is not nervous/anxious.       Past Medical History:  Diagnosis Date  . Abdominal pain, left lower quadrant   . Acute upper respiratory infections of unspecified site   . Allergic genetic state   . Arthritis   . Glaucoma   . Lipoma of unspecified site   . Obesity   . Other and  unspecified noninfectious gastroenteritis and colitis(558.9)   . Other screening mammogram   . Routine general medical examination at a health care facility   . Screening for lipoid disorders   . Special screening for osteoporosis   . Tibialis tendinitis   . Tubular adenoma of colon 03/31/2014  . Type II or unspecified type diabetes mellitus without mention of complication, not stated as uncontrolled   . Undiagnosed cardiac murmurs   . Unspecified essential hypertension     reports that she has never smoked. She has never used smokeless tobacco. She reports that she does not drink alcohol or use drugs.   Current Outpatient Medications:  .  amLODipine (NORVASC) 10 MG tablet, Take 1 tablet (10 mg total) by mouth daily., Disp: 30 tablet, Rfl: 11 .  Dulaglutide (TRULICITY) [id number] 0000000 SOPN, Inject 0.75 mg into the skin once a week., Disp: 3 mL, Rfl: 11 .  gabapentin (NEURONTIN) 100 MG capsule, TAKE 1 CAPSULE (100 MG TOTAL) BY MOUTH AT BEDTIME., Disp: 90 capsule, Rfl: 0 .  Insulin Pen Needle (PEN NEEDLES) 31G X 8 MM MISC, Pen needles for Trulicity, 0000000, Disp: 50 each, Rfl: 11 .  losartan (COZAAR) 50 MG tablet, Take 1 tablet (50 mg total) by mouth daily., Disp: 90 tablet, Rfl: 1 .  clotrimazole (CLOTRIMAZOLE AF) 1 % cream, Apply 1 application topically 2 (two) times daily. (Patient not taking: Reported on 08/08/2018), Disp: 30 g, Rfl: 0 .  metFORMIN (GLUCOPHAGE-XR) 500 MG 24 hr tablet, TAKE 1 TABLET BY MOUTH EVERY DAY WITH DINNER (Patient not  taking: Reported on 08/08/2018), Disp: 90 tablet, Rfl: 1   Observations/Objective: Blood pressure 120/66, pulse 89, temperature 98.2 F (36.8 C), temperature source Temporal, height 5' 0.25" (1.53 m), weight 172 lb 12 oz (78.4 kg), SpO2 94 %.  Physical Exam Constitutional:      General: She is not in acute distress.    Appearance: Normal appearance. She is well-developed. She is obese. She is not ill-appearing or toxic-appearing.  HENT:     Head:  Normocephalic.     Right Ear: Hearing, tympanic membrane, ear canal and external ear normal. Tympanic membrane is not erythematous, retracted or bulging.     Left Ear: Hearing, tympanic membrane, ear canal and external ear normal. Tympanic membrane is not erythematous, retracted or bulging.     Nose: No mucosal edema or rhinorrhea.     Right Sinus: No maxillary sinus tenderness or frontal sinus tenderness.     Left Sinus: No maxillary sinus tenderness or frontal sinus tenderness.     Mouth/Throat:     Pharynx: Uvula midline.  Eyes:     General: Lids are normal. Lids are everted, no foreign bodies appreciated.     Conjunctiva/sclera: Conjunctivae normal.     Pupils: Pupils are equal, round, and reactive to light.  Neck:     Musculoskeletal: Normal range of motion and neck supple.     Thyroid: No thyroid mass or thyromegaly.     Vascular: No carotid bruit.     Trachea: Trachea normal.  Cardiovascular:     Rate and Rhythm: Normal rate and regular rhythm.     Pulses: Normal pulses.     Heart sounds: Normal heart sounds, S1 normal and S2 normal. No murmur. No friction rub. No gallop.   Pulmonary:     Effort: Pulmonary effort is normal. No tachypnea or respiratory distress.     Breath sounds: Normal breath sounds. No decreased breath sounds, wheezing, rhonchi or rales.  Abdominal:     General: Bowel sounds are normal.     Palpations: Abdomen is soft.     Tenderness: There is no abdominal tenderness.  Skin:    General: Skin is warm and dry.     Findings: No rash.  Neurological:     Mental Status: She is alert.  Psychiatric:        Mood and Affect: Mood is not anxious or depressed.        Speech: Speech normal.        Behavior: Behavior normal. Behavior is cooperative.        Thought Content: Thought content normal.        Judgment: Judgment normal.      Assessment and Plan   DM (diabetes mellitus), secondary, uncontrolled, with neurologic complications (HCC) Tolerating  dulaglutide weekly. She is compliant with mes. Follow up with A1C at next OV. Encouraged exercise, weight loss, healthy eating habits.   Essential hypertension, benign Excellent control when compliant with medications. Pt congratulated and encouraged to continue.     Eliezer Lofts, MD

## 2018-11-01 ENCOUNTER — Telehealth: Payer: Self-pay

## 2018-11-01 NOTE — Telephone Encounter (Signed)
Patient left a message on triage line stating she developed Abdominal pain that started at 3 am today. No further information was giving and asked to get a call back. CB: [phone]

## 2018-11-01 NOTE — Telephone Encounter (Signed)
Noted. Continue miralax.

## 2018-11-01 NOTE — Telephone Encounter (Addendum)
Spoke with patient who states her symptoms are pretty much resolved now. Abdominal pain got better after eating some cherrios around 8:30 am. 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 Patient states she has chronic constipation issue that has been addressed and is wondering if the pain was coming from that or could it be due to Trulicity that she has used 3 times now. For her bowels she has been taking Miralax for the past 8 days, she did not take any yesterday or today. Miralax does help but she gets bowel incontinence with it and it is hard to work with that. She is scheduled to work Midwife. She also has tried [employer] 2 times before starting on Miralax but that only helped for one day. Please review CB:[phone]  Also FYI patient went to pick Trulicity new dose and pharamcist said it would be $600. Patient was advised to check with insurance to see why this is higher than the other dose which was $40ish. She will also check with pharmacy in regards to her Free libre supplies because pharmacy did not say they have it but we did send it in.

## 2018-11-01 NOTE — Telephone Encounter (Addendum)
Left v/m for pt to cb to [facility]. If pt calls back please tranfer call to triage.

## 2018-11-04 ENCOUNTER — Telehealth: Payer: Self-pay

## 2018-11-04 DIAGNOSIS — IMO0002 Reserved for concepts with insufficient information to code with codable children: Secondary | ICD-10-CM

## 2018-11-04 DIAGNOSIS — E1349 Other specified diabetes mellitus with other diabetic neurological complication: Secondary | ICD-10-CM

## 2018-11-04 MED ORDER — FREESTYLE LIBRE READER DEVI: 1.0000 | Freq: Three times a day (TID) | 0 refills | Status: DC | PRN

## 2018-11-04 NOTE — Telephone Encounter (Signed)
Patient assistance form completed and placed in Dr. Rometta Emery in box for signature.

## 2018-11-04 NOTE — Telephone Encounter (Signed)
Pt dropped off a form to get assistance with cost of Trulicity. Placed in [place] tower. She is wondering if she can pick up Friday morning to be able to fax before she is due for her next dose.

## 2018-11-04 NOTE — Telephone Encounter (Signed)
Rx for [facility] reader sent to [facility] on [address] in [place].

## 2018-11-06 ENCOUNTER — Ambulatory Visit (INDEPENDENT_AMBULATORY_CARE_PROVIDER_SITE_OTHER): Payer: PPO | Admitting: Family Medicine

## 2018-11-06 ENCOUNTER — Other Ambulatory Visit: Payer: Self-pay

## 2018-11-06 ENCOUNTER — Encounter: Payer: Self-pay | Admitting: Family Medicine

## 2018-11-06 DIAGNOSIS — R1084 Generalized abdominal pain: Secondary | ICD-10-CM

## 2018-11-06 DIAGNOSIS — R109 Unspecified abdominal pain: Secondary | ICD-10-CM | POA: Insufficient documentation

## 2018-11-06 NOTE — Patient Instructions (Addendum)
Goal for fluid intake is 64 oz per day (to keep bowels moving better and for kidney health) Make sure to get enough fiber in diet   Titrate miralax up and down as needed (you can take it more than once per day)  Watch out for stimulant laxatives that can make you cramp   Labs today for abdominal pain   We will check in with you when labs return   Start your nexium as planned   Start tracking your blood sugars   If pain becomes severe or you develop fever or vomiting- let us know and get to the ER
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
## 2018-11-06 NOTE — Telephone Encounter (Signed)
Patient called back today stating that she is still having abdominal pain that lasting about 3 to 4 hours. Questioning if its due to her constipation or from Trulicity. Patient is at work at [employer]. Patient was advised she needs to speak with the nurse and asked to be called back after 3:30 at [phone]. See note below from 11/01/2018 when I spoke with patient.

## 2018-11-06 NOTE — Assessment & Plan Note (Addendum)
Acute on chronic abdominal pain in poorly controlled diabetic who also suffers from chronic constipation and also recent acid reflux symptoms (has nexium with plan to start today) This is episodic w/o any triggers  Suspect may be related to constipation  Disc use of miralax more liberally instead of stimulant products to reduce cramping and greatly increasing fluid in take  Reviewed her last colonooscopy  Reassuring exam  Labs today for chem, liver, lipase and cbc inst to seek care if suddenly worsening symptoms

## 2018-11-06 NOTE — Telephone Encounter (Signed)
Aware, will see her then Cc her pcp

## 2018-11-06 NOTE — Telephone Encounter (Signed)
I spoke with pt; this abd pain has been coming and going for few days; pt does not have pain now but wants to know if should continue taking the Trulicity or could it be constipation or what is causing pain. Pt scheduled in office appt today at [place] with Dr Glori Bickers. 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 ED precautions given and pt voiced understanding. FYI to [employer].

## 2018-11-07 LAB — HEPATIC FUNCTION PANEL
ALT: 10 U/L (ref 0–35)
AST: 13 U/L (ref 0–37)
Albumin: 3.9 g/dL (ref 3.5–5.2)
Alkaline Phosphatase: 67 U/L (ref 39–117)
Bilirubin, Direct: 0.1 mg/dL (ref 0.0–0.3)
Total Bilirubin: 0.5 mg/dL (ref 0.2–1.2)
Total Protein: 6.9 g/dL (ref 6.0–8.3)

## 2018-11-07 LAB — CBC WITH DIFFERENTIAL/PLATELET
Basophils Absolute: 0.1 10*3/uL (ref 0.0–0.1)
Basophils Relative: 0.8 % (ref 0.0–3.0)
Eosinophils Absolute: 0.2 10*3/uL (ref 0.0–0.7)
Eosinophils Relative: 1.9 % (ref 0.0–5.0)
HCT: 39.1 % (ref 36.0–46.0)
Hemoglobin: 13 g/dL (ref 12.0–15.0)
Lymphocytes Relative: 34 % (ref 12.0–46.0)
Lymphs Abs: 2.9 10*3/uL (ref 0.7–4.0)
MCHC: 33.3 g/dL (ref 30.0–36.0)
MCV: 94.9 fl (ref 78.0–100.0)
Monocytes Absolute: 0.4 10*3/uL (ref 0.1–1.0)
Monocytes Relative: 4.8 % (ref 3.0–12.0)
Neutro Abs: 5 10*3/uL (ref 1.4–7.7)
Neutrophils Relative %: 58.5 % (ref 43.0–77.0)
Platelets: 289 10*3/uL (ref 150.0–400.0)
RBC: 4.12 Mil/uL (ref 3.87–5.11)
RDW: 12.8 % (ref 11.5–15.5)
WBC: 8.6 10*3/uL (ref 4.0–10.5)

## 2018-11-07 LAB — LIPASE: Lipase: 25 U/L (ref 11.0–59.0)

## 2018-11-07 LAB — BASIC METABOLIC PANEL
BUN: 22 mg/dL (ref 6–23)
CO2: 28 mEq/L (ref 19–32)
Calcium: 9.6 mg/dL (ref 8.4–10.5)
Chloride: 106 mEq/L (ref 96–112)
Creatinine, Ser: 0.84 mg/dL (ref 0.40–1.20)
GFR: 81.54 mL/min (ref >60.00)
Glucose, Bld: 124 mg/dL — ABNORMAL HIGH (ref 70–99)
Potassium: 4.1 mEq/L (ref 3.5–5.1)
Sodium: 141 mEq/L (ref 135–145)

## 2018-11-18 ENCOUNTER — Telehealth: Payer: Self-pay | Admitting: *Deleted

## 2018-11-18 NOTE — Telephone Encounter (Signed)
Patient left a voicemail stating that she needs a note showing that she had her flu vaccine on 10/01/18 at the office.

## 2018-11-18 NOTE — Telephone Encounter (Signed)
Left message for Patricia Horne that letter about flu vaccine was sent to her MyChart.  I ask that she call me back if she needs me to print her out a copy to pick up.

## 2018-11-19 ENCOUNTER — Telehealth: Payer: Self-pay

## 2018-11-19 NOTE — Telephone Encounter (Signed)
Pt needs copy of immunization record for work to show the flu shot pt received this year. Copy at front desk for pick up and pt voiced understanding.

## 2018-11-20 ENCOUNTER — Other Ambulatory Visit: Payer: Self-pay | Admitting: Family Medicine

## 2018-11-20 DIAGNOSIS — Z1231 Encounter for screening mammogram for malignant neoplasm of breast: Secondary | ICD-10-CM

## 2018-11-26 ENCOUNTER — Ambulatory Visit: Payer: PPO | Admitting: Family Medicine

## 2018-12-05 NOTE — Assessment & Plan Note (Signed)
Excellent control when compliant with medications. Pt congratulated and encouraged to continue.

## 2018-12-05 NOTE — Assessment & Plan Note (Signed)
Tolerating dulaglutide weekly. She is compliant with mes. Follow up with A1C at next OV. Encouraged exercise, weight loss, healthy eating habits.

## 2018-12-06 ENCOUNTER — Encounter: Payer: Self-pay | Admitting: Family Medicine

## 2018-12-06 ENCOUNTER — Other Ambulatory Visit: Payer: Self-pay

## 2018-12-06 ENCOUNTER — Ambulatory Visit (INDEPENDENT_AMBULATORY_CARE_PROVIDER_SITE_OTHER): Payer: PPO | Admitting: Family Medicine

## 2018-12-06 VITALS — BP 120/74 | HR 89 | Temp 98.6°F | Ht 60.25 in | Wt 169.5 lb

## 2018-12-06 DIAGNOSIS — E1349 Other specified diabetes mellitus with other diabetic neurological complication: Secondary | ICD-10-CM

## 2018-12-06 DIAGNOSIS — I10 Essential (primary) hypertension: Secondary | ICD-10-CM | POA: Diagnosis not present

## 2018-12-06 DIAGNOSIS — IMO0002 Reserved for concepts with insufficient information to code with codable children: Secondary | ICD-10-CM

## 2018-12-06 DIAGNOSIS — K5909 Other constipation: Secondary | ICD-10-CM

## 2018-12-06 DIAGNOSIS — Z6832 Body mass index (BMI) 32.0-32.9, adult: Secondary | ICD-10-CM | POA: Diagnosis not present

## 2018-12-06 DIAGNOSIS — E1365 Other specified diabetes mellitus with hyperglycemia: Secondary | ICD-10-CM | POA: Diagnosis not present

## 2018-12-06 LAB — POCT GLYCOSYLATED HEMOGLOBIN (HGB A1C): Hemoglobin A1C: 8.9 % — AB (ref 4.0–5.6)

## 2018-12-06 MED ORDER — LUBIPROSTONE 24 MCG PO CAPS: 24.0000 ug | ORAL_CAPSULE | Freq: Two times a day (BID) | ORAL | 11 refills | Status: DC

## 2018-12-06 NOTE — Assessment & Plan Note (Signed)
Well controlled. Continue current medication.  

## 2018-12-06 NOTE — Progress Notes (Signed)
Chief Complaint  Patient presents with  . Diabetes    History of Present Illness: HPI  68 year old female with diabetes presents for DM follow up. She was seen by Dr. Glori Horne on 11/06/2018 for abdominal pain likely secondary to constipation. Lipase, CMET, cbc normal . Glucose that day was 126! Rec miralax. Increase fluids.  She continues to be constipated.. BM 2-3 times per week, still firm and straining.  Diabetes:  She has not increased to 1.5 trulicity yet.  Less soda, eating better  She is not taking the metfomrin Using medications without difficulties: Hypoglycemic episodes: occ after she eats Hyperglycemic episodes:none Blood Sugars averaging: FBS 105-126  She has lost weight 3 lbs in last month. Wt Readings from Last 3 Encounters:  12/06/18 169 lb 8 oz (76.9 kg)  11/06/18 172 lb 3 oz (78.1 kg)  10/29/18 172 lb 12 oz (78.4 kg)    COVID 19 screen No recent travel or known exposure to COVID19 The patient denies respiratory symptoms of COVID 19 at this time.  The importance of social distancing was discussed today.   ROS    Past Medical History:  Diagnosis Date  . Abdominal pain, left lower quadrant   . Acute upper respiratory infections of unspecified site   . Allergic genetic state   . Arthritis   . Glaucoma   . Lipoma of unspecified site   . Obesity   . Other and unspecified noninfectious gastroenteritis and colitis(558.9)   . Other screening mammogram   . Routine general medical examination at a health care facility   . Screening for lipoid disorders   . Special screening for osteoporosis   . Tibialis tendinitis   . Tubular adenoma of colon 03/31/2014  . Type II or unspecified type diabetes mellitus without mention of complication, not stated as uncontrolled   . Undiagnosed cardiac murmurs   . Unspecified essential hypertension     reports that she has never smoked. She has never used smokeless tobacco. She reports that she does not drink alcohol or use  drugs.   Current Outpatient Medications:  .  amLODipine (NORVASC) 10 MG tablet, Take 1 tablet (10 mg total) by mouth daily., Disp: 30 tablet, Rfl: 11 .  clotrimazole (CLOTRIMAZOLE AF) 1 % cream, Apply 1 application topically 2 (two) times daily., Disp: 30 g, Rfl: 0 .  Continuous Blood Gluc Receiver (FREESTYLE LIBRE READER) DEVI, 1 each by Does not apply route 3 (three) times daily as needed., Disp: 1 each, Rfl: 0 .  Continuous Blood Gluc Sensor (FREESTYLE LIBRE 14 DAY SENSOR) MISC, 1 Units by Does not apply route every 3 (three) days as needed., Disp: 4 each, Rfl: 11 .  Dulaglutide (TRULICITY) 1.5 OV/7.8HY SOPN, Inject 1.5 mg into the skin once a week., Disp: 3 mL, Rfl: 11 .  esomeprazole (NEXIUM) 40 MG capsule, Take 1 capsule (40 mg total) by mouth daily at 12 noon., Disp: 30 capsule, Rfl: 11 .  glucose monitoring kit (FREESTYLE) monitoring kit, 1 each by Does not apply route as needed for other., Disp: 1 each, Rfl: 0 .  Insulin Pen Needle (PEN NEEDLES) 31G X 8 MM MISC, Pen needles for Trulicity, I50.277, Disp: 50 each, Rfl: 11 .  losartan (COZAAR) 50 MG tablet, Take 1 tablet (50 mg total) by mouth daily., Disp: 90 tablet, Rfl: 1 .  gabapentin (NEURONTIN) 100 MG capsule, TAKE 1 CAPSULE (100 MG TOTAL) BY MOUTH AT BEDTIME. (Patient not taking: Reported on 12/06/2018), Disp: 90 capsule, Rfl: 0 .  metFORMIN (GLUCOPHAGE-XR) 500 MG 24 hr tablet, TAKE 1 TABLET BY MOUTH EVERY DAY WITH DINNER (Patient not taking: Reported on 11/06/2018), Disp: 90 tablet, Rfl: 1   Observations/Objective: Blood pressure 120/74, pulse 89, temperature 98.6 F (37 C), temperature source Temporal, height 5' 0.25" (1.53 m), weight 169 lb 8 oz (76.9 kg), SpO2 98 %.  Physical Exam   Assessment and Plan      Eliezer Lofts, MD

## 2018-12-06 NOTE — Assessment & Plan Note (Signed)
Start trial of amitiza higher dose twice daily.

## 2018-12-07 NOTE — Assessment & Plan Note (Signed)
Improving control with pt taking dx seriously and improving compliance. Increase trulicity. Close follow up to encourage pt to continue to be compliant.

## 2018-12-07 NOTE — Assessment & Plan Note (Signed)
Encouraged exercise, weight loss, healthy eating habits. ? ?

## 2019-01-10 ENCOUNTER — Encounter: Payer: Self-pay | Admitting: Family Medicine

## 2019-01-10 ENCOUNTER — Ambulatory Visit (INDEPENDENT_AMBULATORY_CARE_PROVIDER_SITE_OTHER): Payer: PPO | Admitting: Family Medicine

## 2019-01-10 ENCOUNTER — Other Ambulatory Visit: Payer: Self-pay

## 2019-01-10 VITALS — BP 140/78 | HR 61 | Temp 98.2°F | Ht 60.25 in | Wt 165.0 lb

## 2019-01-10 DIAGNOSIS — I10 Essential (primary) hypertension: Secondary | ICD-10-CM | POA: Diagnosis not present

## 2019-01-10 DIAGNOSIS — IMO0002 Reserved for concepts with insufficient information to code with codable children: Secondary | ICD-10-CM

## 2019-01-10 DIAGNOSIS — K5909 Other constipation: Secondary | ICD-10-CM | POA: Diagnosis not present

## 2019-01-10 DIAGNOSIS — E1140 Type 2 diabetes mellitus with diabetic neuropathy, unspecified: Secondary | ICD-10-CM | POA: Diagnosis not present

## 2019-01-10 DIAGNOSIS — E1365 Other specified diabetes mellitus with hyperglycemia: Secondary | ICD-10-CM | POA: Diagnosis not present

## 2019-01-10 DIAGNOSIS — E1349 Other specified diabetes mellitus with other diabetic neurological complication: Secondary | ICD-10-CM | POA: Diagnosis not present

## 2019-01-10 MED ORDER — PANTOPRAZOLE SODIUM 40 MG PO TBEC: 40.0000 mg | DELAYED_RELEASE_TABLET | Freq: Every day | ORAL | 3 refills | Status: DC

## 2019-01-10 NOTE — Assessment & Plan Note (Signed)
Good response to trulicty along.. weight loss, CBGs almost at goal. Pt tolerating, compliant.

## 2019-01-10 NOTE — Progress Notes (Signed)
Chief Complaint  Patient presents with  . Diabetes  . Ear Pain    Right  . Bitter taste    History of Present Illness: HPI  68 year old female presents  For DM follow up.  Diabetes:  On trulicity 1.5 mg daily. She continues to lose weight. She is doing well taking.  Lab Results  Component Value Date   HGBA1C 8.9 (A) 12/06/2018    Using medications without difficulties: Hypoglycemic episodes:none Hyperglycemic episodes: occ.. when eats wrong Feet problems: non ulcers, has neuropathy Blood Sugars averaging: fbs 110 eye exam within last year: Wt Readings from Last 3 Encounters:  01/10/19 165 lb (74.8 kg)  12/06/18 169 lb 8 oz (76.9 kg)  11/06/18 172 lb 3 oz (78.1 kg)   She is having some issues sleeping .  Started lubiprostone for constipation 1 month ago. She could not afford this. She is not having any more pain.. but still constipated.  Burping, gassy, burning in chest, reflux  Despite nexium.  Right ear full. No pain, no congestion.  This visit occurred during the SARS-CoV-2 public health emergency.  Safety protocols were in place, including screening questions prior to the visit, additional usage of staff PPE, and extensive cleaning of exam room while observing appropriate contact time as indicated for disinfecting solutions.   COVID 19 screen:  No recent travel or known exposure to COVID19 The patient denies respiratory symptoms of COVID 19 at this time. The importance of social distancing was discussed today.     ROS    Past Medical History:  Diagnosis Date  . Abdominal pain, left lower quadrant   . Acute upper respiratory infections of unspecified site   . Allergic genetic state   . Arthritis   . Glaucoma   . Lipoma of unspecified site   . Obesity   . Other and unspecified noninfectious gastroenteritis and colitis(558.9)   . Other screening mammogram   . Routine general medical examination at a health care facility   . Screening for lipoid disorders    . Special screening for osteoporosis   . Tibialis tendinitis   . Tubular adenoma of colon 03/31/2014  . Type II or unspecified type diabetes mellitus without mention of complication, not stated as uncontrolled   . Undiagnosed cardiac murmurs   . Unspecified essential hypertension     reports that she has never smoked. She has never used smokeless tobacco. She reports that she does not drink alcohol or use drugs.   Current Outpatient Medications:  .  amLODipine (NORVASC) 10 MG tablet, Take 1 tablet (10 mg total) by mouth daily., Disp: 30 tablet, Rfl: 11 .  clotrimazole (CLOTRIMAZOLE AF) 1 % cream, Apply 1 application topically 2 (two) times daily., Disp: 30 g, Rfl: 0 .  Continuous Blood Gluc Receiver (FREESTYLE LIBRE READER) DEVI, 1 each by Does not apply route 3 (three) times daily as needed., Disp: 1 each, Rfl: 0 .  Continuous Blood Gluc Sensor (FREESTYLE LIBRE 14 DAY SENSOR) MISC, 1 Units by Does not apply route every 3 (three) days as needed., Disp: 4 each, Rfl: 11 .  Dulaglutide (TRULICITY) 1.5 VQ/2.5ZD SOPN, Inject 1.5 mg into the skin once a week., Disp: 3 mL, Rfl: 11 .  esomeprazole (NEXIUM) 40 MG capsule, Take 1 capsule (40 mg total) by mouth daily at 12 noon., Disp: 30 capsule, Rfl: 11 .  glucose monitoring kit (FREESTYLE) monitoring kit, 1 each by Does not apply route as needed for other., Disp: 1 each, Rfl: 0 .  Insulin Pen Needle (PEN NEEDLES) 31G X 8 MM MISC, Pen needles for Trulicity, N19.166, Disp: 50 each, Rfl: 11 .  gabapentin (NEURONTIN) 100 MG capsule, TAKE 1 CAPSULE (100 MG TOTAL) BY MOUTH AT BEDTIME. (Patient not taking: Reported on 12/06/2018), Disp: 90 capsule, Rfl: 0 .  losartan (COZAAR) 50 MG tablet, Take 1 tablet (50 mg total) by mouth daily. (Patient not taking: Reported on 01/10/2019), Disp: 90 tablet, Rfl: 1 .  lubiprostone (AMITIZA) 24 MCG capsule, Take 1 capsule (24 mcg total) by mouth 2 (two) times daily with a meal. (Patient not taking: Reported on 01/10/2019),  Disp: 60 capsule, Rfl: 11 .  metFORMIN (GLUCOPHAGE-XR) 500 MG 24 hr tablet, TAKE 1 TABLET BY MOUTH EVERY DAY WITH DINNER (Patient not taking: Reported on 11/06/2018), Disp: 90 tablet, Rfl: 1   Observations/Objective: Blood pressure 140/78, pulse 61, temperature 98.2 F (36.8 C), temperature source Temporal, height 5' 0.25" (1.53 m), weight 165 lb (74.8 kg), SpO2 99 %.  Physical Exam Constitutional:      General: She is not in acute distress.    Appearance: Normal appearance. She is well-developed. She is not ill-appearing or toxic-appearing.  HENT:     Head: Normocephalic.     Right Ear: Hearing, tympanic membrane, ear canal and external ear normal. Tympanic membrane is not erythematous, retracted or bulging.     Left Ear: Hearing, tympanic membrane, ear canal and external ear normal. Tympanic membrane is not erythematous, retracted or bulging.     Nose: No mucosal edema or rhinorrhea.     Right Sinus: No maxillary sinus tenderness or frontal sinus tenderness.     Left Sinus: No maxillary sinus tenderness or frontal sinus tenderness.     Mouth/Throat:     Pharynx: Uvula midline.  Eyes:     General: Lids are normal. Lids are everted, no foreign bodies appreciated.     Conjunctiva/sclera: Conjunctivae normal.     Pupils: Pupils are equal, round, and reactive to light.  Neck:     Thyroid: No thyroid mass or thyromegaly.     Vascular: No carotid bruit.     Trachea: Trachea normal.  Cardiovascular:     Rate and Rhythm: Normal rate and regular rhythm.     Pulses: Normal pulses.     Heart sounds: Normal heart sounds, S1 normal and S2 normal. No murmur. No friction rub. No gallop.   Pulmonary:     Effort: Pulmonary effort is normal. No tachypnea or respiratory distress.     Breath sounds: Normal breath sounds. No decreased breath sounds, wheezing, rhonchi or rales.  Abdominal:     General: Bowel sounds are normal.     Palpations: Abdomen is soft.     Tenderness: There is no abdominal  tenderness.  Musculoskeletal:     Cervical back: Normal range of motion and neck supple.  Skin:    General: Skin is warm and dry.     Findings: No rash.  Neurological:     Mental Status: She is alert.  Psychiatric:        Mood and Affect: Mood is not anxious or depressed.        Speech: Speech normal.        Behavior: Behavior normal. Behavior is cooperative.        Thought Content: Thought content normal.        Judgment: Judgment normal.      Assessment and Plan   DM (diabetes mellitus), secondary, uncontrolled, with neurologic complications ([facility])  Good  response to trulicty along.. weight loss, CBGs almost at goal. Pt tolerating, compliant.   Constipation, chronic  Cannot afford amitiza or linzess... trial of BID miralax. If not improving refer to GI.  Neuropathy due to type 2 diabetes mellitus  Does not wish to take gabapentin for neuropathy.. trying mg oil rub.  Essential hypertension, benign Pt only taking malodipine. Forgets losartan.. encouraged pt to take ACEI instead of CCB if can only remember one.     Eliezer Lofts, MD

## 2019-01-10 NOTE — Assessment & Plan Note (Signed)
Does not wish to take gabapentin for neuropathy.. trying mg oil rub.

## 2019-01-10 NOTE — Assessment & Plan Note (Signed)
Cannot afford amitiza or linzess... trial of BID miralax. If not improving refer to GI.

## 2019-01-10 NOTE — Assessment & Plan Note (Signed)
Pt only taking malodipine. Forgets losartan.. encouraged pt to take ACEI instead of CCB if can only remember one.

## 2019-01-10 NOTE — Patient Instructions (Addendum)
Try melatonin for sleep.  Increase water intake( may also help with bitter taste in mouth)... try miralax twice daily for constipation.  Stop nexium and change to pantoprazole.  Avoid reflux triggers... stop soda  Continue trulicity at current dose.

## 2019-02-07 ENCOUNTER — Encounter: Payer: Self-pay | Admitting: Family Medicine

## 2019-02-07 ENCOUNTER — Other Ambulatory Visit: Payer: Self-pay

## 2019-02-07 ENCOUNTER — Ambulatory Visit (INDEPENDENT_AMBULATORY_CARE_PROVIDER_SITE_OTHER): Payer: PPO | Admitting: Family Medicine

## 2019-02-07 VITALS — BP 158/96 | HR 88 | Temp 97.9°F | Ht 60.25 in | Wt 167.2 lb

## 2019-02-07 DIAGNOSIS — I10 Essential (primary) hypertension: Secondary | ICD-10-CM | POA: Diagnosis not present

## 2019-02-07 DIAGNOSIS — E1349 Other specified diabetes mellitus with other diabetic neurological complication: Secondary | ICD-10-CM | POA: Diagnosis not present

## 2019-02-07 DIAGNOSIS — Z9114 Patient's other noncompliance with medication regimen: Secondary | ICD-10-CM

## 2019-02-07 DIAGNOSIS — IMO0002 Reserved for concepts with insufficient information to code with codable children: Secondary | ICD-10-CM

## 2019-02-07 DIAGNOSIS — Z91148 Patient's other noncompliance with medication regimen for other reason: Secondary | ICD-10-CM

## 2019-02-07 DIAGNOSIS — E13319 Other specified diabetes mellitus with unspecified diabetic retinopathy without macular edema: Secondary | ICD-10-CM | POA: Diagnosis not present

## 2019-02-07 DIAGNOSIS — E1140 Type 2 diabetes mellitus with diabetic neuropathy, unspecified: Secondary | ICD-10-CM

## 2019-02-07 DIAGNOSIS — E1365 Other specified diabetes mellitus with hyperglycemia: Secondary | ICD-10-CM | POA: Diagnosis not present

## 2019-02-07 NOTE — Assessment & Plan Note (Signed)
Poor control in office as pt has not yet taken ARB.Marland Kitchen if BP remaining > 140/90 restart amlodipine as well.

## 2019-02-07 NOTE — Assessment & Plan Note (Signed)
Patient is compliant with trulicity and although no weight loss.. CBGs seem to be reports in control! Re-eval A1C in 1 months for 3 month check. Get back on track with low carb diet.

## 2019-02-07 NOTE — Progress Notes (Signed)
Chief Complaint  Patient presents with  . Diabetes    History of Present Illness: HPI    69 year old female presetns for DM follow up.  4 week follow up to help pt stay compliant with medications.  Not yet due for lab eval. Lab Results  Component Value Date   HGBA1C 8.9 (A) 12/06/2018    She continues to  Take the trulicity 1.5 mg daily.  she has not had any further weight loss but this was through the holidays, with poor diet control. She has been eating a lot of sweets. FBS 88-120 2 hours 150s No CBGs > 200, nothing lower than 80.  Wt Readings from Last 3 Encounters:  02/07/19 167 lb 4 oz (75.9 kg)  01/10/19 165 lb (74.8 kg)  12/06/18 169 lb 8 oz (76.9 kg)      She is only taking losartan for BP but has not taken yet today.   This visit occurred during the SARS-CoV-2 public health emergency.  Safety protocols were in place, including screening questions prior to the visit, additional usage of staff PPE, and extensive cleaning of exam room while observing appropriate contact time as indicated for disinfecting solutions.   COVID 19 screen:  No recent travel or known exposure to COVID19 The patient denies respiratory symptoms of COVID 19 at this time. The importance of social distancing was discussed today.     ROS    Past Medical History:  Diagnosis Date  . Abdominal pain, left lower quadrant   . Acute upper respiratory infections of unspecified site   . Allergic genetic state   . Arthritis   . Glaucoma   . Lipoma of unspecified site   . Obesity   . Other and unspecified noninfectious gastroenteritis and colitis(558.9)   . Other screening mammogram   . Routine general medical examination at a health care facility   . Screening for lipoid disorders   . Special screening for osteoporosis   . Tibialis tendinitis   . Tubular adenoma of colon 03/31/2014  . Type II or unspecified type diabetes mellitus without mention of complication, not stated as uncontrolled    . Undiagnosed cardiac murmurs   . Unspecified essential hypertension     reports that she has never smoked. She has never used smokeless tobacco. She reports that she does not drink alcohol or use drugs.   Current Outpatient Medications:  .  clotrimazole (CLOTRIMAZOLE AF) 1 % cream, Apply 1 application topically 2 (two) times daily., Disp: 30 g, Rfl: 0 .  Continuous Blood Gluc Receiver (FREESTYLE LIBRE READER) DEVI, 1 each by Does not apply route 3 (three) times daily as needed., Disp: 1 each, Rfl: 0 .  Continuous Blood Gluc Sensor (FREESTYLE LIBRE 14 DAY SENSOR) MISC, 1 Units by Does not apply route every 3 (three) days as needed., Disp: 4 each, Rfl: 11 .  Dulaglutide (TRULICITY) 1.5 WE/3.1VQ SOPN, Inject 1.5 mg into the skin once a week., Disp: 3 mL, Rfl: 11 .  glucose monitoring kit (FREESTYLE) monitoring kit, 1 each by Does not apply route as needed for other., Disp: 1 each, Rfl: 0 .  Insulin Pen Needle (PEN NEEDLES) 31G X 8 MM MISC, Pen needles for Trulicity, M08.676, Disp: 50 each, Rfl: 11 .  losartan (COZAAR) 50 MG tablet, Take 1 tablet (50 mg total) by mouth daily., Disp: 90 tablet, Rfl: 1 .  amLODipine (NORVASC) 10 MG tablet, Take 1 tablet (10 mg total) by mouth daily. (Patient not taking: Reported on  02/07/2019), Disp: 30 tablet, Rfl: 11 .  pantoprazole (PROTONIX) 40 MG tablet, Take 1 tablet (40 mg total) by mouth daily. (Patient not taking: Reported on 02/07/2019), Disp: 30 tablet, Rfl: 3   Observations/Objective: Blood pressure (!) 158/96, pulse 88, temperature 97.9 F (36.6 C), temperature source Temporal, height 5' 0.25" (1.53 m), weight 167 lb 4 oz (75.9 kg), SpO2 97 %.  Physical Exam Constitutional:      General: She is not in acute distress.    Appearance: Normal appearance. She is well-developed. She is not ill-appearing or toxic-appearing.  HENT:     Head: Normocephalic.     Right Ear: Hearing, tympanic membrane, ear canal and external ear normal. Tympanic membrane is not  erythematous, retracted or bulging.     Left Ear: Hearing, tympanic membrane, ear canal and external ear normal. Tympanic membrane is not erythematous, retracted or bulging.     Nose: No mucosal edema or rhinorrhea.     Right Sinus: No maxillary sinus tenderness or frontal sinus tenderness.     Left Sinus: No maxillary sinus tenderness or frontal sinus tenderness.     Mouth/Throat:     Pharynx: Uvula midline.  Eyes:     General: Lids are normal. Lids are everted, no foreign bodies appreciated.     Conjunctiva/sclera: Conjunctivae normal.     Pupils: Pupils are equal, round, and reactive to light.  Neck:     Thyroid: No thyroid mass or thyromegaly.     Vascular: No carotid bruit.     Trachea: Trachea normal.  Cardiovascular:     Rate and Rhythm: Normal rate and regular rhythm.     Pulses: Normal pulses.     Heart sounds: Normal heart sounds, S1 normal and S2 normal. No murmur. No friction rub. No gallop.   Pulmonary:     Effort: Pulmonary effort is normal. No tachypnea or respiratory distress.     Breath sounds: Normal breath sounds. No decreased breath sounds, wheezing, rhonchi or rales.  Abdominal:     General: Bowel sounds are normal.     Palpations: Abdomen is soft.     Tenderness: There is no abdominal tenderness.  Musculoskeletal:     Cervical back: Normal range of motion and neck supple.  Skin:    General: Skin is warm and dry.     Findings: No rash.  Neurological:     Mental Status: She is alert.  Psychiatric:        Mood and Affect: Mood is not anxious or depressed.        Speech: Speech normal.        Behavior: Behavior normal. Behavior is cooperative.        Thought Content: Thought content normal.        Judgment: Judgment normal.      Assessment and Plan DM (diabetes mellitus), secondary, uncontrolled, with neurologic complications ([place]) Patient is compliant with trulicity and although no weight loss.. CBGs seem to be reports in control! Re-eval A1C in 1 months  for 3 month check. Get back on track with low carb diet.  Diabetic retinopathy Followed by Opthamology.  Essential hypertension, benign Poor control in office as pt has not yet taken ARB.Marland Kitchen if BP remaining > 140/90 restart amlodipine as well.  Noncompliance with medication regimen She is doing better with DM meds but is not taking both her BP meds.  Neuropathy due to type 2 diabetes mellitus Stable control at this time. No progression. Hopefull will improve with better DM  control.,       Eliezer Lofts, MD

## 2019-02-07 NOTE — Assessment & Plan Note (Signed)
She is doing better with DM meds but is not taking both her BP meds.

## 2019-02-07 NOTE — Patient Instructions (Addendum)
Poor blood pressure control.. you need to take both amlodipine as well as losartan if BPs at home > 140/90.  Contniue trulicity.  Keep working on increase water, get back on track with low sugar diet.

## 2019-02-07 NOTE — Assessment & Plan Note (Signed)
Stable control at this time. No progression. Hopefull will improve with better DM control.,

## 2019-02-07 NOTE — Assessment & Plan Note (Signed)
Followed by Opthamology.

## 2019-02-18 ENCOUNTER — Ambulatory Visit
Admission: RE | Admit: 2019-02-18 | Discharge: 2019-02-18 | Disposition: A | Discharge status: Home / Self Care | Payer: PPO | Source: Ambulatory Visit | Attending: Family Medicine | Admitting: Family Medicine

## 2019-02-18 DIAGNOSIS — Z1231 Encounter for screening mammogram for malignant neoplasm of breast: Secondary | ICD-10-CM | POA: Insufficient documentation

## 2019-02-27 ENCOUNTER — Telehealth: Payer: Self-pay

## 2019-02-27 NOTE — Telephone Encounter (Signed)
LVM w COVID screen, back lab and front door info 1.28.2021 TLJ 

## 2019-03-03 ENCOUNTER — Other Ambulatory Visit (INDEPENDENT_AMBULATORY_CARE_PROVIDER_SITE_OTHER): Payer: PPO

## 2019-03-03 ENCOUNTER — Other Ambulatory Visit: Payer: Self-pay

## 2019-03-03 ENCOUNTER — Telehealth: Payer: Self-pay | Admitting: Family Medicine

## 2019-03-03 DIAGNOSIS — E1365 Other specified diabetes mellitus with hyperglycemia: Secondary | ICD-10-CM

## 2019-03-03 DIAGNOSIS — IMO0002 Reserved for concepts with insufficient information to code with codable children: Secondary | ICD-10-CM

## 2019-03-03 DIAGNOSIS — E1349 Other specified diabetes mellitus with other diabetic neurological complication: Secondary | ICD-10-CM

## 2019-03-03 LAB — COMPREHENSIVE METABOLIC PANEL
ALT: 9 U/L (ref 0–35)
AST: 13 U/L (ref 0–37)
Albumin: 3.7 g/dL (ref 3.5–5.2)
Alkaline Phosphatase: 66 U/L (ref 39–117)
BUN: 20 mg/dL (ref 6–23)
CO2: 28 mEq/L (ref 19–32)
Calcium: 9.1 mg/dL (ref 8.4–10.5)
Chloride: 104 mEq/L (ref 96–112)
Creatinine, Ser: 0.77 mg/dL (ref 0.40–1.20)
GFR: 90.07 mL/min (ref >60.00)
Glucose, Bld: 110 mg/dL — ABNORMAL HIGH (ref 70–99)
Potassium: 4.2 mEq/L (ref 3.5–5.1)
Sodium: 139 mEq/L (ref 135–145)
Total Bilirubin: 0.7 mg/dL (ref 0.2–1.2)
Total Protein: 6.8 g/dL (ref 6.0–8.3)

## 2019-03-03 LAB — LIPID PANEL
Cholesterol: 140 mg/dL (ref 0–200)
HDL: 45.3 mg/dL (ref >39.00)
LDL Cholesterol: 81 mg/dL (ref 0–99)
NonHDL: 94.56
Total CHOL/HDL Ratio: 3
Triglycerides: 67 mg/dL (ref 0.0–149.0)
VLDL: 13.4 mg/dL (ref 0.0–40.0)

## 2019-03-03 LAB — HEMOGLOBIN A1C: Hgb A1c MFr Bld: 6.5 % (ref 4.6–6.5)

## 2019-03-03 NOTE — Telephone Encounter (Signed)
-----   Message from Cloyd Stagers, RT sent at 02/21/2019  1:14 PM EST ----- Regarding: Lab Orders for Monday 2.1.2021 Please place lab orders for Monday 2.1.2021, office visit for 4 wk DM f/u on Tuesday 2.9.2021 Thank you, Dyke Maes RT(R)

## 2019-03-04 NOTE — Progress Notes (Signed)
No critical labs need to be addressed urgently. We will discuss labs in detail at upcoming office visit.   

## 2019-03-06 DIAGNOSIS — E113313 Type 2 diabetes mellitus with moderate nonproliferative diabetic retinopathy with macular edema, bilateral: Secondary | ICD-10-CM | POA: Diagnosis not present

## 2019-03-06 DIAGNOSIS — H401132 Primary open-angle glaucoma, bilateral, moderate stage: Secondary | ICD-10-CM | POA: Diagnosis not present

## 2019-03-06 LAB — HM DIABETES EYE EXAM

## 2019-03-07 ENCOUNTER — Ambulatory Visit: Payer: PPO | Admitting: Family Medicine

## 2019-03-11 ENCOUNTER — Encounter: Payer: Self-pay | Admitting: Family Medicine

## 2019-03-11 ENCOUNTER — Other Ambulatory Visit: Payer: Self-pay

## 2019-03-11 ENCOUNTER — Ambulatory Visit (INDEPENDENT_AMBULATORY_CARE_PROVIDER_SITE_OTHER): Payer: PPO | Admitting: Family Medicine

## 2019-03-11 VITALS — BP 160/80 | HR 83 | Temp 98.2°F | Ht 60.25 in | Wt 168.2 lb

## 2019-03-11 DIAGNOSIS — E1140 Type 2 diabetes mellitus with diabetic neuropathy, unspecified: Secondary | ICD-10-CM

## 2019-03-11 DIAGNOSIS — Z9114 Patient's other noncompliance with medication regimen: Secondary | ICD-10-CM | POA: Diagnosis not present

## 2019-03-11 DIAGNOSIS — I10 Essential (primary) hypertension: Secondary | ICD-10-CM

## 2019-03-11 DIAGNOSIS — IMO0002 Reserved for concepts with insufficient information to code with codable children: Secondary | ICD-10-CM

## 2019-03-11 DIAGNOSIS — E1365 Other specified diabetes mellitus with hyperglycemia: Secondary | ICD-10-CM | POA: Diagnosis not present

## 2019-03-11 DIAGNOSIS — E1349 Other specified diabetes mellitus with other diabetic neurological complication: Secondary | ICD-10-CM

## 2019-03-11 NOTE — Assessment & Plan Note (Signed)
Excellent improvement in control with Trulicity and diet changes.  Pt able to be more compliant given taking weekly.

## 2019-03-11 NOTE — Assessment & Plan Note (Signed)
Restart gabapentin and titrate up to 300 mg at bedtime.

## 2019-03-11 NOTE — Assessment & Plan Note (Signed)
Well controlled when takes med. Encouraged compliant with medicaiton.

## 2019-03-11 NOTE — Patient Instructions (Addendum)
Work on walking 3-5 days a week.  Keep taking medications as instructed. Start back gabapentin 100mg  .. can increase to 200 mg at bedtime after 1 week , if still not controlling symptoms but tolerated after 1 week increase to 300 mg at bedtime.

## 2019-03-11 NOTE — Progress Notes (Signed)
Chief Complaint  Patient presents with  . Diabetes    History of Present Illness: HPI  68 year old female presents for  4 week DM follow up  Diabetes:  Minimal weight loss of trulicity 1.5 mg weekly but improved  A1C!  She has been compliant with medication! Lab Results  Component Value Date   HGBA1C 6.5 03/03/2019  Using medications without difficulties: Hypoglycemic episodes: none Hyperglycemic episodes: rare now Feet problems:no ulcers Blood Sugars averaging: < 120 eye exam within last year: yes, retinopathy  Wt Readings from Last 3 Encounters:  03/11/19 168 lb 4 oz (76.3 kg)  02/07/19 167 lb 4 oz (75.9 kg)  01/10/19 165 lb (74.8 kg)    Elevated in office today... amlodipine and losartan.. has not taken it yet today. BP Readings from Last 3 Encounters:  03/11/19 (!) 160/80  02/07/19 (!) 158/96  01/10/19 140/78    Elevated Cholesterol:  LDL at goal  < 100. Not on statin.. this is indicated given DM... pt continues to refuse. Lab Results  Component Value Date   CHOL 140 03/03/2019   HDL 45.30 03/03/2019   LDLCALC 81 03/03/2019   TRIG 67.0 03/03/2019   CHOLHDL 3 03/03/2019  Using medications without problems: Muscle aches:  Diet compliance: Exercise: walking Other complaints:    This visit occurred during the SARS-CoV-2 public health emergency.  Safety protocols were in place, including screening questions prior to the visit, additional usage of staff PPE, and extensive cleaning of exam room while observing appropriate contact time as indicated for disinfecting solutions.   COVID 19 screen:  No recent travel or known exposure to COVID19 The patient denies respiratory symptoms of COVID 19 at this time. The importance of social distancing was discussed today.     ROS    Past Medical History:  Diagnosis Date  . Abdominal pain, left lower quadrant   . Acute upper respiratory infections of unspecified site   . Allergic genetic state   . Arthritis   .  Glaucoma   . Lipoma of unspecified site   . Obesity   . Other and unspecified noninfectious gastroenteritis and colitis(558.9)   . Other screening mammogram   . Routine general medical examination at a health care facility   . Screening for lipoid disorders   . Special screening for osteoporosis   . Tibialis tendinitis   . Tubular adenoma of colon 03/31/2014  . Type II or unspecified type diabetes mellitus without mention of complication, not stated as uncontrolled   . Undiagnosed cardiac murmurs   . Unspecified essential hypertension     reports that she has never smoked. She has never used smokeless tobacco. She reports that she does not drink alcohol or use drugs.   Current Outpatient Medications:  .  amLODipine (NORVASC) 10 MG tablet, Take 1 tablet (10 mg total) by mouth daily., Disp: 30 tablet, Rfl: 11 .  clotrimazole (CLOTRIMAZOLE AF) 1 % cream, Apply 1 application topically 2 (two) times daily., Disp: 30 g, Rfl: 0 .  Continuous Blood Gluc Receiver (FREESTYLE LIBRE READER) DEVI, 1 each by Does not apply route 3 (three) times daily as needed., Disp: 1 each, Rfl: 0 .  Continuous Blood Gluc Sensor (FREESTYLE LIBRE 14 DAY SENSOR) MISC, 1 Units by Does not apply route every 3 (three) days as needed., Disp: 4 each, Rfl: 11 .  Dulaglutide (TRULICITY) 1.5 MG/0.5ML SOPN, Inject 1.5 mg into the skin once a week., Disp: 3 mL, Rfl: 11 .  glucose monitoring kit (  FREESTYLE) monitoring kit, 1 each by Does not apply route as needed for other., Disp: 1 each, Rfl: 0 .  Insulin Pen Needle (PEN NEEDLES) 31G X 8 MM MISC, Pen needles for Trulicity, Z61.096, Disp: 50 each, Rfl: 11 .  losartan (COZAAR) 50 MG tablet, Take 1 tablet (50 mg total) by mouth daily., Disp: 90 tablet, Rfl: 1   Observations/Objective: Blood pressure (!) 160/80, pulse 83, temperature 98.2 F (36.8 C), temperature source Temporal, height 5' 0.25" (1.53 m), weight 168 lb 4 oz (76.3 kg), SpO2 97 %.  Physical Exam Constitutional:       General: She is not in acute distress.    Appearance: Normal appearance. She is well-developed. She is not ill-appearing or toxic-appearing.  HENT:     Head: Normocephalic.     Right Ear: Hearing, tympanic membrane, ear canal and external ear normal. Tympanic membrane is not erythematous, retracted or bulging.     Left Ear: Hearing, tympanic membrane, ear canal and external ear normal. Tympanic membrane is not erythematous, retracted or bulging.     Nose: No mucosal edema or rhinorrhea.     Right Sinus: No maxillary sinus tenderness or frontal sinus tenderness.     Left Sinus: No maxillary sinus tenderness or frontal sinus tenderness.     Mouth/Throat:     Pharynx: Uvula midline.  Eyes:     General: Lids are normal. Lids are everted, no foreign bodies appreciated.     Conjunctiva/sclera: Conjunctivae normal.     Pupils: Pupils are equal, round, and reactive to light.  Neck:     Thyroid: No thyroid mass or thyromegaly.     Vascular: No carotid bruit.     Trachea: Trachea normal.  Cardiovascular:     Rate and Rhythm: Normal rate and regular rhythm.     Pulses: Normal pulses.     Heart sounds: Normal heart sounds, S1 normal and S2 normal. No murmur. No friction rub. No gallop.   Pulmonary:     Effort: Pulmonary effort is normal. No tachypnea or respiratory distress.     Breath sounds: Normal breath sounds. No decreased breath sounds, wheezing, rhonchi or rales.  Abdominal:     General: Bowel sounds are normal.     Palpations: Abdomen is soft.     Tenderness: There is no abdominal tenderness.  Musculoskeletal:     Cervical back: Normal range of motion and neck supple.  Skin:    General: Skin is warm and dry.     Findings: No rash.  Neurological:     Mental Status: She is alert.  Psychiatric:        Mood and Affect: Mood is not anxious or depressed.        Speech: Speech normal.        Behavior: Behavior normal. Behavior is cooperative.        Thought Content: Thought content  normal.        Judgment: Judgment normal.      Assessment and Plan   DM (diabetes mellitus), secondary, uncontrolled, with neurologic complications ([facility]) Excellent improvement in control with Trulicity and diet changes.  Pt able to be more compliant given taking weekly.  Essential hypertension, benign Well controlled when takes med. Encouraged compliant with medicaiton.  Neuropathy due to type 2 diabetes mellitus Restart gabapentin and titrate up to 300 mg at bedtime.  Noncompliance with medication regimen She is doing better but still not taking BP meds regularly. Warned pt of longterm complications of elevated BP.Marland Kitchen  she voiced understanding.  Also recommended statin.. pt refused.     Eliezer Lofts, MD

## 2019-03-11 NOTE — Assessment & Plan Note (Signed)
She is doing better but still not taking BP meds regularly. Warned pt of longterm complications of elevated BP.. she voiced understanding.  Also recommended statin.. pt refused.

## 2019-03-12 ENCOUNTER — Encounter: Payer: Self-pay | Admitting: Family Medicine

## 2019-04-03 ENCOUNTER — Telehealth: Payer: Self-pay | Admitting: Family Medicine

## 2019-04-03 NOTE — Telephone Encounter (Signed)
Likely viral Gastroenteritis. If not improving with the previous recommendations... should be seen at urgent care.

## 2019-04-03 NOTE — Telephone Encounter (Signed)
Patricia Horne notified as instructed by telephone.  Patients states she is doing some better after eating a plain baked potato and plans on going to work tonight.  She will seek medical attention at an UC if she doesn't continue to improve.

## 2019-04-03 NOTE — Telephone Encounter (Signed)
Pt states that diarrhea started on Friday 2/26. Pt states that she ate fish that night and sometimes fish will upset her stomach but not for several days. Pt has not been able to keep any liquids or foods in, everything "goes straight through". Pt has eaten toast but she put jelly on it and that made her diarrhea worse, tried mac-n-cheese but that upset further and she is now going to try a baked potato.  I explained to the patient that she needs to follow the BRAT diet, bland food, increase liquids Pt states that she does not like water so she only drinks Ginger-Ale and other sodas/juices.  Pt has not tried OTC meds - trying to let it run its course.   Pt denies fever or nausea.   Request further recommendations.

## 2019-04-06 ENCOUNTER — Other Ambulatory Visit: Payer: Self-pay | Admitting: Family Medicine

## 2019-04-16 ENCOUNTER — Telehealth: Payer: Self-pay | Admitting: *Deleted

## 2019-04-16 NOTE — Telephone Encounter (Signed)
Trulicity can cause some diarrhea.. if greasy food avoidance and imodium prn control issue.. I would recommend continuing trulicity given excellent improvement of DM on this medication. If it is intolerable... we can lower trulicity dose as she was not having this issue on lower dose.  Her history is of chronic constipation.

## 2019-04-16 NOTE — Telephone Encounter (Signed)
Left message for Patricia Horne to return my call.

## 2019-04-16 NOTE — Telephone Encounter (Signed)
Patient called stating that she is still having diarrhea off and on.Patient stated that this has been going on for several weeks.  Patient stated as long as she eats a bland diet she seems to be fine. Patient stated if she eats any fried foods it seems like it goes right thru her. Patient stated that she has a little cramping and soreness on the right side of her abdomin. Patient stated that she is on Trulicity and is wondering if that could be causing the diarrhea?. Patient stated that she has taken some Imodium to help with the diarrhea. Patient denies fever or any other symptoms. Patient was advised to continue with a bland diet and to stay hydrated. ER precautions were given to patient and she verbalized understanding.  Patient is aware that Dr. Diona Browner is out of the office today. Patient stated if you call back tomorrow she will be at work and to call [phone].

## 2019-04-16 NOTE — Telephone Encounter (Signed)
Ms. Dolloff notified as instructed by telephone.  She will continue with current dose of Trulicity.  She will try to void greasy foods and use the imodium as needed.  If the diarrhea becomes intolerable, she will call us back to discuss lowering her Trulicity dose.

## 2019-04-23 ENCOUNTER — Telehealth: Payer: Self-pay | Admitting: Family Medicine

## 2019-04-23 NOTE — Telephone Encounter (Signed)
Patient called today to schedule visit Asked the reason for visit and she stated that she did not have energy, she is always tired, and has diarrhea, Advised that with the symptoms I am not able to bring her in but could start her with a virtual visit with a provider and go from there.  Patient stated No, She is not having that, she needs to be seen.  But didn't need to see anyone until next week.  Tried to explain to patient why we could not bring her in.  She stated she is not having diarrhea.   Patient became very upset towards me said she will need to be seen she is NOT doing a virtual.  Let patient know that Dr Diona Browner is out of office but ill forward this over to her to see what she suggest, Patient said great you should do that.    Please advise, thanks!

## 2019-04-25 NOTE — Telephone Encounter (Signed)
Spoke with Espyn. She states its the same chronic diarrhea.  As long as she take immodium, she is okay.  She also thinks she may have Gout in her big toe.  Office visit scheduled for 04/29/2019 at 2:40 pm with Dr. Diona Browner.

## 2019-04-25 NOTE — Telephone Encounter (Signed)
Please call patient.   I believe this is her chronic intermittent diarrhea.. not new acute and infecitous, but please verify.

## 2019-04-25 NOTE — Telephone Encounter (Signed)
Left message for Patricia Horne to return my call.

## 2019-04-29 ENCOUNTER — Other Ambulatory Visit: Payer: Self-pay

## 2019-04-29 ENCOUNTER — Ambulatory Visit (INDEPENDENT_AMBULATORY_CARE_PROVIDER_SITE_OTHER): Payer: PPO | Admitting: Family Medicine

## 2019-04-29 ENCOUNTER — Encounter: Payer: Self-pay | Admitting: Family Medicine

## 2019-04-29 VITALS — BP 158/82 | HR 88 | Temp 98.2°F | Ht 60.25 in | Wt 167.5 lb

## 2019-04-29 DIAGNOSIS — R197 Diarrhea, unspecified: Secondary | ICD-10-CM

## 2019-04-29 MED ORDER — TRULICITY 0.75 MG/0.5ML ~~LOC~~ SOAJ: 0.7500 mg | SUBCUTANEOUS | 11 refills | Status: DC

## 2019-04-29 NOTE — Patient Instructions (Addendum)
Can decrease back Trulicity to [id number] mg weekly.  Continue low carb diet.

## 2019-04-29 NOTE — Progress Notes (Signed)
Chief Complaint  Patient presents with  . Diarrhea    ?? if side eff of trulicity  . Possible Gout    Left Big Toe (resolved)    History of Present Illness: HPI   69 year old female presents with diarrhea ongoing x 3 weeks. She has a history of going from one extreme to other.. constipation to diarrhea. Occ abd pain. 3-4 BMs a day.. watery/soft Pain in abd feels better after BM.  She feels like she was having  Less on lower dose of trulicity. She is using immodium daily. This controls it entirely.  no change with diet.  Wt Readings from Last 3 Encounters:  04/29/19 167 lb 8 oz (76 kg)  03/11/19 168 lb 4 oz (76.3 kg)  02/07/19 167 lb 4 oz (75.9 kg)    Lab Results  Component Value Date   HGBA1C 6.5 03/03/2019      This visit occurred during the SARS-CoV-2 public health emergency.  Safety protocols were in place, including screening questions prior to the visit, additional usage of staff PPE, and extensive cleaning of exam room while observing appropriate contact time as indicated for disinfecting solutions.   COVID 19 screen:  No recent travel or known exposure to COVID19 The patient denies respiratory symptoms of COVID 19 at this time. The importance of social distancing was discussed today.     Review of Systems  Constitutional: Negative for chills and fever.  HENT: Negative for congestion and ear pain.   Eyes: Negative for pain and redness.  Respiratory: Negative for cough and shortness of breath.   Cardiovascular: Negative for chest pain, palpitations and leg swelling.  Gastrointestinal: Positive for diarrhea. Negative for abdominal pain, blood in stool, constipation, nausea and vomiting.  Genitourinary: Negative for dysuria.  Musculoskeletal: Negative for falls and myalgias.  Skin: Negative for rash.  Neurological: Negative for dizziness.  Psychiatric/Behavioral: Negative for depression. The patient is not nervous/anxious.       Past Medical History:   Diagnosis Date  . Abdominal pain, left lower quadrant   . Acute upper respiratory infections of unspecified site   . Allergic genetic state   . Arthritis   . Glaucoma   . Lipoma of unspecified site   . Obesity   . Other and unspecified noninfectious gastroenteritis and colitis(558.9)   . Other screening mammogram   . Routine general medical examination at a health care facility   . Screening for lipoid disorders   . Special screening for osteoporosis   . Tibialis tendinitis   . Tubular adenoma of colon 03/31/2014  . Type II or unspecified type diabetes mellitus without mention of complication, not stated as uncontrolled   . Undiagnosed cardiac murmurs   . Unspecified essential hypertension     reports that she has never smoked. She has never used smokeless tobacco. She reports that she does not drink alcohol or use drugs.   Current Outpatient Medications:  .  amLODipine (NORVASC) 10 MG tablet, Take 1 tablet (10 mg total) by mouth daily., Disp: 30 tablet, Rfl: 11 .  clotrimazole (CLOTRIMAZOLE AF) 1 % cream, Apply 1 application topically 2 (two) times daily., Disp: 30 g, Rfl: 0 .  Continuous Blood Gluc Receiver (FREESTYLE LIBRE READER) DEVI, 1 each by Does not apply route 3 (three) times daily as needed., Disp: 1 each, Rfl: 0 .  Continuous Blood Gluc Sensor (FREESTYLE LIBRE 14 DAY SENSOR) MISC, 1 Units by Does not apply route every 3 (three) days as needed., Disp: 4  each, Rfl: 11 .  Dulaglutide (TRULICITY) 1.5 EG/3.1DV SOPN, Inject 1.5 mg into the skin once a week., Disp: 3 mL, Rfl: 11 .  glucose monitoring kit (FREESTYLE) monitoring kit, 1 each by Does not apply route as needed for other., Disp: 1 each, Rfl: 0 .  Insulin Pen Needle (PEN NEEDLES) 31G X 8 MM MISC, Pen needles for Trulicity, V61.607, Disp: 50 each, Rfl: 11 .  losartan (COZAAR) 50 MG tablet, Take 1 tablet (50 mg total) by mouth daily., Disp: 90 tablet, Rfl: 1 .  gabapentin (NEURONTIN) 100 MG capsule, Take 100 mg by mouth  at bedtime. Start 100 mg at bedtime but can increase to 200 mg or 300 mg if tolerated, Disp: , Rfl:    Observations/Objective: Blood pressure (!) 158/82, pulse 88, temperature 98.2 F (36.8 C), temperature source Temporal, height 5' 0.25" (1.53 m), weight 167 lb 8 oz (76 kg), SpO2 99 %.  Physical Exam Constitutional:      General: She is not in acute distress.    Appearance: Normal appearance. She is well-developed. She is not ill-appearing or toxic-appearing.  HENT:     Head: Normocephalic.     Right Ear: Hearing, tympanic membrane, ear canal and external ear normal. Tympanic membrane is not erythematous, retracted or bulging.     Left Ear: Hearing, tympanic membrane, ear canal and external ear normal. Tympanic membrane is not erythematous, retracted or bulging.     Nose: No mucosal edema or rhinorrhea.     Right Sinus: No maxillary sinus tenderness or frontal sinus tenderness.     Left Sinus: No maxillary sinus tenderness or frontal sinus tenderness.     Mouth/Throat:     Pharynx: Uvula midline.  Eyes:     General: Lids are normal. Lids are everted, no foreign bodies appreciated.     Conjunctiva/sclera: Conjunctivae normal.     Pupils: Pupils are equal, round, and reactive to light.  Neck:     Thyroid: No thyroid mass or thyromegaly.     Vascular: No carotid bruit.     Trachea: Trachea normal.  Cardiovascular:     Rate and Rhythm: Normal rate and regular rhythm.     Pulses: Normal pulses.     Heart sounds: Normal heart sounds, S1 normal and S2 normal. No murmur. No friction rub. No gallop.   Pulmonary:     Effort: Pulmonary effort is normal. No tachypnea or respiratory distress.     Breath sounds: Normal breath sounds. No decreased breath sounds, wheezing, rhonchi or rales.  Abdominal:     General: Bowel sounds are normal.     Palpations: Abdomen is soft.     Tenderness: There is no abdominal tenderness.  Musculoskeletal:     Cervical back: Normal range of motion and neck  supple.  Skin:    General: Skin is warm and dry.     Findings: No rash.  Neurological:     Mental Status: She is alert.  Psychiatric:        Mood and Affect: Mood is not anxious or depressed.        Speech: Speech normal.        Behavior: Behavior normal. Behavior is cooperative.        Thought Content: Thought content normal.        Judgment: Judgment normal.      Assessment and Plan   Acute diarrhea Not clearly infectious.Decrease back to lower dose of trulicity as pt felt she was tolerating that better.  Patricia Salmi, MD   

## 2019-04-30 DIAGNOSIS — E113313 Type 2 diabetes mellitus with moderate nonproliferative diabetic retinopathy with macular edema, bilateral: Secondary | ICD-10-CM | POA: Diagnosis not present

## 2019-05-15 ENCOUNTER — Telehealth: Payer: Self-pay | Admitting: Family Medicine

## 2019-05-15 NOTE — Telephone Encounter (Signed)
Pt called and states that she received notification that her dosing has changed on her Truilicity so our office needs to contact them with this information before they can ship her the medications.   [facility] [phone]

## 2019-05-15 NOTE — Telephone Encounter (Addendum)
Called [facility] and gave verbal order to pharmacist for Trulicity [id number] mg inject once weekly, 12 pens with 3 refills

## 2019-05-20 ENCOUNTER — Other Ambulatory Visit: Payer: Self-pay

## 2019-05-20 ENCOUNTER — Ambulatory Visit
Admission: EM | Admit: 2019-05-20 | Discharge: 2019-05-20 | Disposition: A | Discharge status: Home / Self Care | Payer: PPO | Attending: Emergency Medicine | Admitting: Emergency Medicine

## 2019-05-20 ENCOUNTER — Telehealth: Payer: Self-pay | Admitting: *Deleted

## 2019-05-20 DIAGNOSIS — R112 Nausea with vomiting, unspecified: Secondary | ICD-10-CM

## 2019-05-20 DIAGNOSIS — E1165 Type 2 diabetes mellitus with hyperglycemia: Secondary | ICD-10-CM

## 2019-05-20 DIAGNOSIS — R197 Diarrhea, unspecified: Secondary | ICD-10-CM | POA: Diagnosis not present

## 2019-05-20 LAB — POCT FASTING CBG KUC MANUAL ENTRY: POCT Glucose (KUC): 153 mg/dL — AB (ref 70–99)

## 2019-05-20 MED ORDER — ONDANSETRON HCL 4 MG PO TABS: 4.0000 mg | ORAL_TABLET | Freq: Four times a day (QID) | ORAL | 0 refills | Status: DC | PRN

## 2019-05-20 MED ORDER — ONDANSETRON 4 MG PO TBDP
4.0000 mg | ORAL_TABLET | Freq: Once | ORAL | Status: AC
  Administered 2019-05-20: 4 mg via ORAL
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
## 2019-05-20 NOTE — Discharge Instructions (Addendum)
Your blood sugar is elevated at 153.  Please schedule an appointment with your primary care provider to discuss this.    Take the antinausea medication as directed.    Keep yourself hydrated with clear liquids, such as water, Gatorade, Pedialyte, Sprite, or ginger ale.    Go to the emergency department if you have severe vomiting or diarrhea; Or if you develop new symptoms such as fever or abdominal pain.

## 2019-05-20 NOTE — ED Triage Notes (Signed)
Patient states that she has been having diarrhea off and on since increasing Trulicity. Patient states that last night she ate some pizza and around 12am she started having vomiting and diarrhea. Patient states that she has not vomited since 6am today. States that she does feel thirsty now.

## 2019-05-20 NOTE — Telephone Encounter (Signed)
Patient called stating that she started last night with vomiting and diarrhea. Patient stated that she is concerned because she is a diabetic. Patient stated that she has not been able to check her sugar because her meter is broken. Patient stated that she has a new meter, but feels so bad that she does not feel like fooling with it. Patient stated that there was a mix up with her dose of Trulicity and she did not take it Friday like she was suppose to.  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  After speaking with Dr. Diona Browner,  patient was advised that she does need to be evaluated so she will need to go to an Urgent Care. Advised patient unfortunately we can not bring her into the office because of her symptoms. Patient stated that she will go to an Urgent Care, but not sure where she will go.

## 2019-05-20 NOTE — Telephone Encounter (Signed)
Agree with recommendations.  

## 2019-05-21 DIAGNOSIS — R197 Diarrhea, unspecified: Secondary | ICD-10-CM | POA: Insufficient documentation

## 2019-05-21 NOTE — Assessment & Plan Note (Signed)
Not clearly infectious.Decrease back to lower dose of trulicity as pt felt she was tolerating that better.

## 2019-06-03 ENCOUNTER — Telehealth: Payer: Self-pay | Admitting: Family Medicine

## 2019-06-03 NOTE — Progress Notes (Signed)
°  Chronic Care Management   Outreach Note  06/03/2019 Name: Patricia Horne MRN: [id number] DOB: 1950-11-25  Referred by: Jinny Sanders, MD Reason for referral : No chief complaint on file.   An unsuccessful telephone outreach was attempted today. The patient was referred to the pharmacist for assistance with care management and care coordination.    This note is not being shared with the patient for the following reason: To respect privacy (The patient or proxy has requested that the information not be shared).  Follow Up Plan:   Patricia Horne [employer] Scheduler

## 2019-06-04 NOTE — Telephone Encounter (Signed)
Patient called stating that she has not received the correct prescription for Trulicity [id number] mg yet.  Patient stated that she spoke to someone at [facility] and was advised that Dr. Diona Browner needs to call [facility] and give them the order. [employer] [phone]

## 2019-06-04 NOTE — Telephone Encounter (Signed)
Spoke with [facility].  I think there was some confusion with the dosage change and for some reason they cancelled her order.  They have re-processed order which can take up to 24 hours.  I left message for Ameilia letting her know what was going on and I ask that she call me back if she needs anything further.

## 2019-06-06 ENCOUNTER — Telehealth: Payer: Self-pay | Admitting: Family Medicine

## 2019-06-06 DIAGNOSIS — IMO0002 Reserved for concepts with insufficient information to code with codable children: Secondary | ICD-10-CM

## 2019-06-06 DIAGNOSIS — E1349 Other specified diabetes mellitus with other diabetic neurological complication: Secondary | ICD-10-CM

## 2019-06-06 NOTE — Telephone Encounter (Signed)
-----   Message from Cloyd Stagers, RT sent at 05/27/2019  1:31 PM EDT ----- Regarding: Lab Orders for Monday 5.10.2021 Please place lab orders for Monday 5.10.2021, 3 month f/u Tuesday 5.18.2021 Thank you, Dyke Maes RT(R)

## 2019-06-09 ENCOUNTER — Other Ambulatory Visit: Payer: Self-pay

## 2019-06-09 ENCOUNTER — Other Ambulatory Visit (INDEPENDENT_AMBULATORY_CARE_PROVIDER_SITE_OTHER): Payer: PPO

## 2019-06-09 ENCOUNTER — Telehealth: Payer: Self-pay | Admitting: Family Medicine

## 2019-06-09 DIAGNOSIS — E1349 Other specified diabetes mellitus with other diabetic neurological complication: Secondary | ICD-10-CM

## 2019-06-09 DIAGNOSIS — IMO0002 Reserved for concepts with insufficient information to code with codable children: Secondary | ICD-10-CM

## 2019-06-09 DIAGNOSIS — E1365 Other specified diabetes mellitus with hyperglycemia: Secondary | ICD-10-CM | POA: Diagnosis not present

## 2019-06-09 LAB — COMPREHENSIVE METABOLIC PANEL
ALT: 11 U/L (ref 0–35)
AST: 14 U/L (ref 0–37)
Albumin: 3.6 g/dL (ref 3.5–5.2)
Alkaline Phosphatase: 71 U/L (ref 39–117)
BUN: 27 mg/dL — ABNORMAL HIGH (ref 6–23)
CO2: 30 mEq/L (ref 19–32)
Calcium: 9 mg/dL (ref 8.4–10.5)
Chloride: 107 mEq/L (ref 96–112)
Creatinine, Ser: 0.82 mg/dL (ref 0.40–1.20)
GFR: 83.69 mL/min (ref >60.00)
Glucose, Bld: 141 mg/dL — ABNORMAL HIGH (ref 70–99)
Potassium: 3.9 mEq/L (ref 3.5–5.1)
Sodium: 142 mEq/L (ref 135–145)
Total Bilirubin: 0.7 mg/dL (ref 0.2–1.2)
Total Protein: 6.9 g/dL (ref 6.0–8.3)

## 2019-06-09 LAB — LIPID PANEL
Cholesterol: 131 mg/dL (ref 0–200)
HDL: 44.6 mg/dL (ref >39.00)
LDL Cholesterol: 74 mg/dL (ref 0–99)
NonHDL: 86.63
Total CHOL/HDL Ratio: 3
Triglycerides: 63 mg/dL (ref 0.0–149.0)
VLDL: 12.6 mg/dL (ref 0.0–40.0)

## 2019-06-09 LAB — HEMOGLOBIN A1C: Hgb A1c MFr Bld: 6.7 % — ABNORMAL HIGH (ref 4.6–6.5)

## 2019-06-09 NOTE — Telephone Encounter (Signed)
Patient came in office and stated that she tried reaching out last week but hasn't got a response but she needs Dr.Bedsole to call Patricia Horne care and confirm with them that you lowered her dose from a 1.5 to a .75.

## 2019-06-09 NOTE — Progress Notes (Signed)
No critical labs need to be addressed urgently. We will discuss labs in detail at upcoming office visit.   

## 2019-06-14 ENCOUNTER — Other Ambulatory Visit: Payer: Self-pay | Admitting: Family Medicine

## 2019-06-17 ENCOUNTER — Other Ambulatory Visit: Payer: Self-pay

## 2019-06-17 ENCOUNTER — Ambulatory Visit (INDEPENDENT_AMBULATORY_CARE_PROVIDER_SITE_OTHER): Payer: PPO | Admitting: Family Medicine

## 2019-06-17 ENCOUNTER — Encounter: Payer: Self-pay | Admitting: Family Medicine

## 2019-06-17 VITALS — BP 150/90 | HR 89 | Temp 97.5°F | Ht 60.25 in | Wt 172.8 lb

## 2019-06-17 DIAGNOSIS — Z6832 Body mass index (BMI) 32.0-32.9, adult: Secondary | ICD-10-CM

## 2019-06-17 DIAGNOSIS — I10 Essential (primary) hypertension: Secondary | ICD-10-CM

## 2019-06-17 DIAGNOSIS — E1349 Other specified diabetes mellitus with other diabetic neurological complication: Secondary | ICD-10-CM | POA: Diagnosis not present

## 2019-06-17 DIAGNOSIS — IMO0002 Reserved for concepts with insufficient information to code with codable children: Secondary | ICD-10-CM

## 2019-06-17 DIAGNOSIS — E1159 Type 2 diabetes mellitus with other circulatory complications: Secondary | ICD-10-CM | POA: Diagnosis not present

## 2019-06-17 DIAGNOSIS — E13319 Other specified diabetes mellitus with unspecified diabetic retinopathy without macular edema: Secondary | ICD-10-CM

## 2019-06-17 DIAGNOSIS — I152 Hypertension secondary to endocrine disorders: Secondary | ICD-10-CM

## 2019-06-17 DIAGNOSIS — E1140 Type 2 diabetes mellitus with diabetic neuropathy, unspecified: Secondary | ICD-10-CM | POA: Diagnosis not present

## 2019-06-17 DIAGNOSIS — Z789 Other specified health status: Secondary | ICD-10-CM | POA: Insufficient documentation

## 2019-06-17 DIAGNOSIS — E1365 Other specified diabetes mellitus with hyperglycemia: Secondary | ICD-10-CM

## 2019-06-17 NOTE — Assessment & Plan Note (Signed)
Stable followed by opthalmology.

## 2019-06-17 NOTE — Assessment & Plan Note (Signed)
Stable control on gabapentin prn. 

## 2019-06-17 NOTE — Progress Notes (Signed)
Chief Complaint  Patient presents with  . Diabetes    History of Present Illness: HPI   69 year old female presents for follow up DM.  Diabetes:   Continued improved control despite need to reduce Trulicity to lower dosing due to SE. Diarrhea now resolved. Lab Results  Component Value Date   HGBA1C 6.7 (H) 06/09/2019  Using medications without difficulties: Hypoglycemic episodes:none Hyperglycemic episodes: occ Feet problems: no ulcers, neuropathy  On gabapentin prn Blood Sugars averaging: FBS  89-153 eye exam within last year: yes Associated with retinopathy and neuropathy  Elevated Cholesterol:  LDL at goal on  NO med.. Not open to statin use. Lab Results  Component Value Date   CHOL 131 06/09/2019   HDL 44.60 06/09/2019   LDLCALC 74 06/09/2019   TRIG 63.0 06/09/2019   CHOLHDL 3 06/09/2019  Using medications without problems: Muscle aches:  Diet compliance: watching what she eats off and on Exercise: waling 2 days a week Other complaints:  Hypertension:    BP above goal .. She had  taken BP med yet today amlodipine and losartan. BP Readings from Last 3 Encounters:  06/17/19 (!) 150/90  05/20/19 132/75  04/29/19 (!) 158/82  Using medication without problems or lightheadedness:  none Chest pain with exertion:none Edema:none Short of breath:none Average home BPs: < 140/90 Other issues:   Wt Readings from Last 3 Encounters:  06/17/19 172 lb 12 oz (78.4 kg)  05/20/19 170 lb (77.1 kg)  04/29/19 167 lb 8 oz (76 kg)    This visit occurred during the SARS-CoV-2 public health emergency.  Safety protocols were in place, including screening questions prior to the visit, additional usage of staff PPE, and extensive cleaning of exam room while observing appropriate contact time as indicated for disinfecting solutions.   COVID 19 screen:  No recent travel or known exposure to COVID19 The patient denies respiratory symptoms of COVID 19 at this time. The importance of  social distancing was discussed today.    Has had COVID vaccine.   Review of Systems  Constitutional: Negative for chills and fever.  HENT: Negative for congestion and ear pain.   Eyes: Negative for pain and redness.  Respiratory: Negative for cough and shortness of breath.   Cardiovascular: Negative for chest pain, palpitations and leg swelling.  Gastrointestinal: Negative for abdominal pain, blood in stool, constipation, diarrhea, nausea and vomiting.  Genitourinary: Negative for dysuria.  Musculoskeletal: Negative for falls and myalgias.  Skin: Negative for rash.  Neurological: Negative for dizziness.  Psychiatric/Behavioral: Negative for depression. The patient is not nervous/anxious.       Past Medical History:  Diagnosis Date  . Abdominal pain, left lower quadrant   . Acute upper respiratory infections of unspecified site   . Allergic genetic state   . Arthritis   . Glaucoma   . Lipoma of unspecified site   . Obesity   . Other and unspecified noninfectious gastroenteritis and colitis(558.9)   . Other screening mammogram   . Routine general medical examination at a health care facility   . Screening for lipoid disorders   . Special screening for osteoporosis   . Tibialis tendinitis   . Tubular adenoma of colon 03/31/2014  . Type II or unspecified type diabetes mellitus without mention of complication, not stated as uncontrolled   . Undiagnosed cardiac murmurs   . Unspecified essential hypertension     reports that she has never smoked. She has never used smokeless tobacco. She reports that she does  not drink alcohol or use drugs.   Current Outpatient Medications:  .  amLODipine (NORVASC) 10 MG tablet, Take 1 tablet (10 mg total) by mouth daily., Disp: 30 tablet, Rfl: 11 .  clotrimazole (CLOTRIMAZOLE AF) 1 % cream, Apply 1 application topically 2 (two) times daily., Disp: 30 g, Rfl: 0 .  Continuous Blood Gluc Receiver (FREESTYLE LIBRE READER) DEVI, 1 each by Does not  apply route 3 (three) times daily as needed., Disp: 1 each, Rfl: 0 .  Continuous Blood Gluc Sensor (FREESTYLE LIBRE 14 DAY SENSOR) MISC, 1 Units by Does not apply route every 3 (three) days as needed., Disp: 4 each, Rfl: 11 .  Dulaglutide (TRULICITY) 4.09 WJ/1.9JY SOPN, Inject 0.75 mg into the skin once a week., Disp: 3 mL, Rfl: 11 .  glucose monitoring kit (FREESTYLE) monitoring kit, 1 each by Does not apply route as needed for other., Disp: 1 each, Rfl: 0 .  Insulin Pen Needle (PEN NEEDLES) 31G X 8 MM MISC, Pen needles for Trulicity, N82.956, Disp: 50 each, Rfl: 11 .  losartan (COZAAR) 50 MG tablet, TAKE 1 TABLET BY MOUTH EVERY DAY, Disp: 90 tablet, Rfl: 1 .  gabapentin (NEURONTIN) 100 MG capsule, Take 100 mg by mouth at bedtime. Start 100 mg at bedtime but can increase to 200 mg or 300 mg if tolerated, Disp: , Rfl:    Observations/Objective: Blood pressure (!) 150/90, pulse 89, temperature (!) 97.5 F (36.4 C), temperature source Temporal, height 5' 0.25" (1.53 m), weight 172 lb 12 oz (78.4 kg), SpO2 94 %.  Physical Exam Constitutional:      General: She is not in acute distress.    Appearance: Normal appearance. She is well-developed. She is obese. She is not ill-appearing or toxic-appearing.  HENT:     Head: Normocephalic.     Right Ear: Hearing, tympanic membrane, ear canal and external ear normal. Tympanic membrane is not erythematous, retracted or bulging.     Left Ear: Hearing, tympanic membrane, ear canal and external ear normal. Tympanic membrane is not erythematous, retracted or bulging.     Nose: No mucosal edema or rhinorrhea.     Right Sinus: No maxillary sinus tenderness or frontal sinus tenderness.     Left Sinus: No maxillary sinus tenderness or frontal sinus tenderness.     Mouth/Throat:     Pharynx: Uvula midline.  Eyes:     General: Lids are normal. Lids are everted, no foreign bodies appreciated.     Conjunctiva/sclera: Conjunctivae normal.     Pupils: Pupils are  equal, round, and reactive to light.  Neck:     Thyroid: No thyroid mass or thyromegaly.     Vascular: No carotid bruit.     Trachea: Trachea normal.  Cardiovascular:     Rate and Rhythm: Normal rate and regular rhythm.     Pulses: Normal pulses.     Heart sounds: Normal heart sounds, S1 normal and S2 normal. No murmur. No friction rub. No gallop.   Pulmonary:     Effort: Pulmonary effort is normal. No tachypnea or respiratory distress.     Breath sounds: Normal breath sounds. No decreased breath sounds, wheezing, rhonchi or rales.  Abdominal:     General: Bowel sounds are normal.     Palpations: Abdomen is soft.     Tenderness: There is no abdominal tenderness.  Musculoskeletal:     Cervical back: Normal range of motion and neck supple.  Skin:    General: Skin is warm and dry.  Findings: No rash.  Neurological:     Mental Status: She is alert.  Psychiatric:        Mood and Affect: Mood is not anxious or depressed.        Speech: Speech normal.        Behavior: Behavior normal. Behavior is cooperative.        Thought Content: Thought content normal.        Judgment: Judgment normal.      Diabetic foot exam: Normal inspection No skin breakdown No calluses  Normal DP pulses Normal sensation to light touch and monofilament Nails normal  Assessment and Plan   DM (diabetes mellitus), secondary, uncontrolled, with neurologic complications (HCC) Stable control on Trulicity. Continue low carb diet. If in 3 month A1C continuing to trend up on lower dose Trulicity.Marland Kitchen we can consider adding back and additional medication.   Statin indicated but pt unable to tolerate in past   Hypertension associated with diabetes ([place]) Well controlled when taking medication. Encouraged compliance.  Diabetic retinopathy Stable followed by opthalmology.  Neuropathy due to type 2 diabetes mellitus Stable control on gabapentin prn.  Body mass index (BMI) of 32.0-32.9 in adult Encouraged  exercise, weight loss, healthy eating habits.      Eliezer Lofts, MD

## 2019-06-17 NOTE — Assessment & Plan Note (Addendum)
Stable control on Trulicity. Continue low carb diet. If in 3 month A1C continuing to trend up on lower dose Trulicity.Marland Kitchen we can consider adding back and additional medication.   Statin indicated but pt not interested given concern for SE.

## 2019-06-17 NOTE — Assessment & Plan Note (Signed)
Well controlled when taking medication. Encouraged compliance.

## 2019-06-17 NOTE — Patient Instructions (Signed)
Take BP med ASAP.  Continue current dose of Trulicity.  Work on low [employer]

## 2019-06-17 NOTE — Assessment & Plan Note (Signed)
Encouraged exercise, weight loss, healthy eating habits. ? ?

## 2019-06-18 DIAGNOSIS — E113313 Type 2 diabetes mellitus with moderate nonproliferative diabetic retinopathy with macular edema, bilateral: Secondary | ICD-10-CM | POA: Diagnosis not present

## 2019-07-30 DIAGNOSIS — E113313 Type 2 diabetes mellitus with moderate nonproliferative diabetic retinopathy with macular edema, bilateral: Secondary | ICD-10-CM | POA: Diagnosis not present

## 2019-09-10 DIAGNOSIS — E113313 Type 2 diabetes mellitus with moderate nonproliferative diabetic retinopathy with macular edema, bilateral: Secondary | ICD-10-CM | POA: Diagnosis not present

## 2019-09-15 DIAGNOSIS — H2512 Age-related nuclear cataract, left eye: Secondary | ICD-10-CM | POA: Diagnosis not present

## 2019-09-15 DIAGNOSIS — M1991 Primary osteoarthritis, unspecified site: Secondary | ICD-10-CM | POA: Diagnosis not present

## 2019-09-16 ENCOUNTER — Telehealth: Payer: Self-pay | Admitting: Family Medicine

## 2019-09-16 DIAGNOSIS — IMO0002 Reserved for concepts with insufficient information to code with codable children: Secondary | ICD-10-CM

## 2019-09-16 DIAGNOSIS — E1349 Other specified diabetes mellitus with other diabetic neurological complication: Secondary | ICD-10-CM

## 2019-09-16 NOTE — Telephone Encounter (Signed)
-----   Message from Cloyd Stagers, RT sent at 09/01/2019  2:43 PM EDT ----- Regarding: Lab Orders for Wednesday 8.18.2021 Please place lab orders for Wednesday 8.18.2021, office visit for physical on Tuesday 8.24.2021 Thank you, Dyke Maes RT(R)

## 2019-09-17 ENCOUNTER — Ambulatory Visit: Payer: PPO

## 2019-09-17 ENCOUNTER — Other Ambulatory Visit: Payer: PPO

## 2019-09-19 ENCOUNTER — Other Ambulatory Visit (INDEPENDENT_AMBULATORY_CARE_PROVIDER_SITE_OTHER): Payer: PPO

## 2019-09-19 ENCOUNTER — Other Ambulatory Visit: Payer: Self-pay

## 2019-09-19 DIAGNOSIS — E1349 Other specified diabetes mellitus with other diabetic neurological complication: Secondary | ICD-10-CM

## 2019-09-19 DIAGNOSIS — IMO0002 Reserved for concepts with insufficient information to code with codable children: Secondary | ICD-10-CM

## 2019-09-19 DIAGNOSIS — E1365 Other specified diabetes mellitus with hyperglycemia: Secondary | ICD-10-CM

## 2019-09-19 LAB — COMPREHENSIVE METABOLIC PANEL
ALT: 12 U/L (ref 0–35)
AST: 14 U/L (ref 0–37)
Albumin: 3.8 g/dL (ref 3.5–5.2)
Alkaline Phosphatase: 80 U/L (ref 39–117)
BUN: 25 mg/dL — ABNORMAL HIGH (ref 6–23)
CO2: 29 mEq/L (ref 19–32)
Calcium: 9.2 mg/dL (ref 8.4–10.5)
Chloride: 107 mEq/L (ref 96–112)
Creatinine, Ser: 0.87 mg/dL (ref 0.40–1.20)
GFR: 78.1 mL/min (ref >60.00)
Glucose, Bld: 116 mg/dL — ABNORMAL HIGH (ref 70–99)
Potassium: 4.1 mEq/L (ref 3.5–5.1)
Sodium: 142 mEq/L (ref 135–145)
Total Bilirubin: 0.6 mg/dL (ref 0.2–1.2)
Total Protein: 7.2 g/dL (ref 6.0–8.3)

## 2019-09-19 LAB — LIPID PANEL
Cholesterol: 159 mg/dL (ref 0–200)
HDL: 50.5 mg/dL (ref >39.00)
LDL Cholesterol: 97 mg/dL (ref 0–99)
NonHDL: 108.97
Total CHOL/HDL Ratio: 3
Triglycerides: 58 mg/dL (ref 0.0–149.0)
VLDL: 11.6 mg/dL (ref 0.0–40.0)

## 2019-09-19 LAB — HEMOGLOBIN A1C: Hgb A1c MFr Bld: 6.5 % (ref 4.6–6.5)

## 2019-09-19 NOTE — Progress Notes (Signed)
No critical labs need to be addressed urgently. We will discuss labs in detail at upcoming office visit.   

## 2019-09-23 ENCOUNTER — Other Ambulatory Visit: Payer: Self-pay

## 2019-09-23 ENCOUNTER — Ambulatory Visit (INDEPENDENT_AMBULATORY_CARE_PROVIDER_SITE_OTHER): Payer: PPO | Admitting: Family Medicine

## 2019-09-23 ENCOUNTER — Encounter: Payer: Self-pay | Admitting: Family Medicine

## 2019-09-23 VITALS — BP 140/80 | HR 84 | Temp 98.2°F | Ht 61.0 in | Wt 172.5 lb

## 2019-09-23 DIAGNOSIS — E1349 Other specified diabetes mellitus with other diabetic neurological complication: Secondary | ICD-10-CM | POA: Diagnosis not present

## 2019-09-23 DIAGNOSIS — E1140 Type 2 diabetes mellitus with diabetic neuropathy, unspecified: Secondary | ICD-10-CM

## 2019-09-23 DIAGNOSIS — E1159 Type 2 diabetes mellitus with other circulatory complications: Secondary | ICD-10-CM

## 2019-09-23 DIAGNOSIS — E1365 Other specified diabetes mellitus with hyperglycemia: Secondary | ICD-10-CM | POA: Diagnosis not present

## 2019-09-23 DIAGNOSIS — I10 Essential (primary) hypertension: Secondary | ICD-10-CM

## 2019-09-23 DIAGNOSIS — R5383 Other fatigue: Secondary | ICD-10-CM | POA: Diagnosis not present

## 2019-09-23 DIAGNOSIS — IMO0002 Reserved for concepts with insufficient information to code with codable children: Secondary | ICD-10-CM

## 2019-09-23 DIAGNOSIS — Z0000 Encounter for general adult medical examination without abnormal findings: Secondary | ICD-10-CM

## 2019-09-23 LAB — CBC WITH DIFFERENTIAL/PLATELET
Basophils Absolute: 0.1 10*3/uL (ref 0.0–0.1)
Basophils Relative: 0.9 % (ref 0.0–3.0)
Eosinophils Absolute: 0.3 10*3/uL (ref 0.0–0.7)
Eosinophils Relative: 3.7 % (ref 0.0–5.0)
HCT: 38.3 % (ref 36.0–46.0)
Hemoglobin: 12.8 g/dL (ref 12.0–15.0)
Lymphocytes Relative: 29.7 % (ref 12.0–46.0)
Lymphs Abs: 2.4 10*3/uL (ref 0.7–4.0)
MCHC: 33.5 g/dL (ref 30.0–36.0)
MCV: 93.7 fl (ref 78.0–100.0)
Monocytes Absolute: 0.6 10*3/uL (ref 0.1–1.0)
Monocytes Relative: 6.8 % (ref 3.0–12.0)
Neutro Abs: 4.8 10*3/uL (ref 1.4–7.7)
Neutrophils Relative %: 58.9 % (ref 43.0–77.0)
Platelets: 267 10*3/uL (ref 150.0–400.0)
RBC: 4.08 Mil/uL (ref 3.87–5.11)
RDW: 13 % (ref 11.5–15.5)
WBC: 8.2 10*3/uL (ref 4.0–10.5)

## 2019-09-23 LAB — TSH: TSH: 1.07 u[IU]/mL (ref 0.35–4.50)

## 2019-09-23 LAB — HM DIABETES FOOT EXAM

## 2019-09-23 LAB — VITAMIN B12: Vitamin B-12: 258 pg/mL (ref 211–911)

## 2019-09-23 LAB — VITAMIN D 25 HYDROXY (VIT D DEFICIENCY, FRACTURES): VITD: 9.87 ng/mL — ABNORMAL LOW (ref 30.00–100.00)

## 2019-09-23 MED ORDER — VITAMIN D3 1.25 MG (50000 UT) PO CAPS: ORAL_CAPSULE | ORAL | 3 refills | Status: DC

## 2019-09-23 NOTE — Progress Notes (Signed)
Chief Complaint  Patient presents with  . Medicare Wellness    History of Present Illness: HPI  The patient presents for annual medicare wellness, complete physical and review of chronic health problems. He/She also has the following acute concerns today:  I have personally reviewed the Medicare Annual Wellness questionnaire and have noted 1. The patient's medical and social history 2. Their use of alcohol, tobacco or illicit drugs 3. Their current medications and supplements 4. The patient's functional ability including ADL's, fall risks, home safety risks and hearing or visual             impairment. 5. Diet and physical activities 6. Evidence for depression or mood disorders 7.         Updated provider list Cognitive evaluation was performed and recorded on pt medicare questionnaire form. The patients weight, height, BMI and visual acuity have been recorded in the chart  I have made referrals, counseling and provided education to the patient based review of the above and I have provided the pt with a written personalized care plan for preventive services.   Documentation of this information was scanned into the electronic record under the media tab.   Advance directives and end of life planning reviewed in detail with patient and documented in EMR. Patient given handout on advance care directives if needed. HCPOA and living will updated if needed.   Hearing Screening   Method: Audiometry   125Hz  250Hz  500Hz  1000Hz  2000Hz  3000Hz  4000Hz  6000Hz  8000Hz   Right ear:   20 20 20  20     Left ear:   25 20 20  25     Vision Screening Comments: Eye Exam at [facility] 2021-Has cataract surgery scheduled for 09/29/19  Fall Risk  09/23/2019 06/17/2019 06/12/2018 05/09/2016  Falls in the past year? 1 1 0 No  Number falls in past yr: 1 1 - -  Injury with Fall? 0 0 - -      Office Visit from 09/23/2019 in [place] at [facility] Total Score 0     Diabetes:   Good control on  trulicity. Lab Results  Component Value Date   HGBA1C 6.5 09/19/2019  Using medications without difficulties: Hypoglycemic episodes: Hyperglycemic episodes: Feet problems: on gabapentin for DM neuropathy Blood Sugars averaging: eye exam within last year: followed for Diabetic retinopathy Wt Readings from Last 3 Encounters:  09/23/19 172 lb 8 oz (78.2 kg)  06/17/19 172 lb 12 oz (78.4 kg)  05/20/19 170 lb (77.1 kg)     Hypertension:    On amlodipine and losartan.. she has not take it today. BP Readings from Last 3 Encounters:  09/23/19 140/80  06/17/19 (!) 150/90  05/20/19 132/75  Using medication without problems or lightheadedness:  none Chest pain with exertion:none Edema:none Short of breath:none Average home BPs: Other issues:  She continues to feel tired with exertion.  No SOB, no chest pain with exertion. She does not snore at night. Wakes up every 2 hours but able back to sleep.       This visit occurred during the SARS-CoV-2 public health emergency.  Safety protocols were in place, including screening questions prior to the visit, additional usage of staff PPE, and extensive cleaning of exam room while observing appropriate contact time as indicated for disinfecting solutions.   COVID 19 screen:  No recent travel or known exposure to COVID19 The patient denies respiratory symptoms of COVID 19 at this time. The importance of social distancing was discussed today.  Review of Systems  Constitutional: Positive for malaise/fatigue. Negative for chills and fever.  HENT: Negative for congestion and ear pain.   Eyes: Negative for pain and redness.  Respiratory: Negative for cough and shortness of breath.   Cardiovascular: Negative for chest pain, palpitations and leg swelling.  Gastrointestinal: Negative for abdominal pain, blood in stool, constipation, diarrhea, nausea and vomiting.  Genitourinary: Negative for dysuria.  Musculoskeletal: Negative for falls and  myalgias.  Skin: Negative for rash.  Neurological: Negative for dizziness.  Psychiatric/Behavioral: Negative for depression. The patient is not nervous/anxious.       Past Medical History:  Diagnosis Date  . Abdominal pain, left lower quadrant   . Acute upper respiratory infections of unspecified site   . Allergic genetic state   . Arthritis   . Glaucoma   . Lipoma of unspecified site   . Obesity   . Other and unspecified noninfectious gastroenteritis and colitis(558.9)   . Other screening mammogram   . Routine general medical examination at a health care facility   . Screening for lipoid disorders   . Special screening for osteoporosis   . Tibialis tendinitis   . Tubular adenoma of colon 03/31/2014  . Type II or unspecified type diabetes mellitus without mention of complication, not stated as uncontrolled   . Undiagnosed cardiac murmurs   . Unspecified essential hypertension     reports that she has never smoked. She has never used smokeless tobacco. She reports that she does not drink alcohol and does not use drugs.   Current Outpatient Medications:  .  amLODipine (NORVASC) 10 MG tablet, Take 1 tablet (10 mg total) by mouth daily., Disp: 30 tablet, Rfl: 11 .  Continuous Blood Gluc Receiver (FREESTYLE LIBRE READER) DEVI, 1 each by Does not apply route 3 (three) times daily as needed., Disp: 1 each, Rfl: 0 .  Continuous Blood Gluc Sensor (FREESTYLE LIBRE 14 DAY SENSOR) MISC, 1 Units by Does not apply route every 3 (three) days as needed., Disp: 4 each, Rfl: 11 .  Dulaglutide (TRULICITY) 1.61 WR/6.0AV SOPN, Inject 0.75 mg into the skin once a week., Disp: 3 mL, Rfl: 11 .  glucose monitoring kit (FREESTYLE) monitoring kit, 1 each by Does not apply route as needed for other., Disp: 1 each, Rfl: 0 .  Insulin Pen Needle (PEN NEEDLES) 31G X 8 MM MISC, Pen needles for Trulicity, W09.811, Disp: 50 each, Rfl: 11 .  losartan (COZAAR) 50 MG tablet, TAKE 1 TABLET BY MOUTH EVERY DAY, Disp: 90  tablet, Rfl: 1 .  gabapentin (NEURONTIN) 100 MG capsule, Take 100 mg by mouth at bedtime. Start 100 mg at bedtime but can increase to 200 mg or 300 mg if tolerated (Patient not taking: Reported on 09/23/2019), Disp: , Rfl:    Observations/Objective: Blood pressure 140/80, pulse 84, temperature 98.2 F (36.8 C), temperature source Temporal, height 5' 1"  (1.549 m), weight 172 lb 8 oz (78.2 kg), SpO2 98 %.  Physical Exam Constitutional:      General: She is not in acute distress.    Appearance: Normal appearance. She is well-developed. She is obese. She is not ill-appearing or toxic-appearing.  HENT:     Head: Normocephalic.     Right Ear: Hearing, tympanic membrane, ear canal and external ear normal.     Left Ear: Hearing, tympanic membrane, ear canal and external ear normal.     Nose: Nose normal.  Eyes:     General: Lids are normal. Lids are everted, no foreign  bodies appreciated.     Conjunctiva/sclera: Conjunctivae normal.     Pupils: Pupils are equal, round, and reactive to light.  Neck:     Thyroid: No thyroid mass or thyromegaly.     Vascular: No carotid bruit.     Trachea: Trachea normal.  Cardiovascular:     Rate and Rhythm: Normal rate and regular rhythm.     Heart sounds: Normal heart sounds, S1 normal and S2 normal. No murmur heard.  No gallop.   Pulmonary:     Effort: Pulmonary effort is normal. No respiratory distress.     Breath sounds: Normal breath sounds. No wheezing, rhonchi or rales.  Abdominal:     General: Bowel sounds are normal. There is no distension or abdominal bruit.     Palpations: Abdomen is soft. There is no fluid wave or mass.     Tenderness: There is no abdominal tenderness. There is no guarding or rebound.     Hernia: No hernia is present.  Musculoskeletal:     Cervical back: Normal range of motion and neck supple.  Lymphadenopathy:     Cervical: No cervical adenopathy.  Skin:    General: Skin is warm and dry.     Findings: No rash.   Neurological:     Mental Status: She is alert.     Cranial Nerves: No cranial nerve deficit.     Sensory: No sensory deficit.  Psychiatric:        Mood and Affect: Mood is not anxious or depressed.        Speech: Speech normal.        Behavior: Behavior normal. Behavior is cooperative.        Judgment: Judgment normal.     Diabetic foot exam: Normal inspection No skin breakdown No calluses  Normal DP pulses Normal sensation to light touch and monofilament Nails normal  Assessment and Plan   The patient's preventative maintenance and recommended screening tests for an annual wellness exam were reviewed in full today. Brought up to date unless services declined.  Counselled on the importance of diet, exercise, and its role in overall health and mortality. The patient's FH and SH was reviewed, including their home life, tobacco status, and drug and alcohol status.   Vaccine:Uptodate with Td and pneumovax, covid19, prevnar. Will return for flu shot. PAP/DVE: TAH, not indicated.  Mammo:01/2019, DBE not indicated DEXA; 2018 nml, repeatdue in 5 years Colon: 12/2017 hx of adenoma... Repeat in 3 years.Dr. Gustavo Lah Nonsmoker Refused HIV Hep C: done  DM (diabetes mellitus), secondary, uncontrolled, with neurologic complications ([place]) Good control on trulicity.  Hypertension associated with diabetes ([place]) Good control when compliant with meds. Encouraged compliance.  Neuropathy due to type 2 diabetes mellitus Stable control on current regimen.  Fatigue Concerning for cardiac cause.. refer back to cardiology for possible further testing.   Eliezer Lofts, MD

## 2019-09-23 NOTE — Patient Instructions (Addendum)
We will set up a cardiology OV.  Continue current  Medication.  Please stop at the lab to have labs drawn.

## 2019-09-25 ENCOUNTER — Other Ambulatory Visit: Payer: Self-pay

## 2019-09-25 ENCOUNTER — Other Ambulatory Visit
Admission: RE | Admit: 2019-09-25 | Discharge: 2019-09-25 | Disposition: A | Discharge status: Home / Self Care | Payer: PPO | Source: Ambulatory Visit | Attending: Ophthalmology | Admitting: Ophthalmology

## 2019-09-25 ENCOUNTER — Encounter: Payer: Self-pay | Admitting: Ophthalmology

## 2019-09-25 DIAGNOSIS — Z20822 Contact with and (suspected) exposure to covid-19: Secondary | ICD-10-CM | POA: Insufficient documentation

## 2019-09-25 DIAGNOSIS — Z01812 Encounter for preprocedural laboratory examination: Secondary | ICD-10-CM | POA: Insufficient documentation

## 2019-09-25 LAB — SARS CORONAVIRUS 2 (TAT 6-24 HRS): SARS Coronavirus 2: NEGATIVE

## 2019-09-25 NOTE — Discharge Instructions (Signed)

## 2019-09-29 ENCOUNTER — Ambulatory Visit: Payer: PPO | Admitting: Anesthesiology

## 2019-09-29 ENCOUNTER — Other Ambulatory Visit: Payer: Self-pay

## 2019-09-29 ENCOUNTER — Encounter
Admission: RE | Disposition: A | Discharge status: Home / Self Care | Payer: Self-pay | Source: Home / Self Care | Attending: Ophthalmology

## 2019-09-29 ENCOUNTER — Encounter: Payer: Self-pay | Admitting: Ophthalmology

## 2019-09-29 ENCOUNTER — Ambulatory Visit
Admission: RE | Admit: 2019-09-29 | Discharge: 2019-09-29 | Disposition: A | Discharge status: Home / Self Care | Payer: PPO | Attending: Ophthalmology | Admitting: Ophthalmology

## 2019-09-29 DIAGNOSIS — G709 Myoneural disorder, unspecified: Secondary | ICD-10-CM | POA: Insufficient documentation

## 2019-09-29 DIAGNOSIS — M1990 Unspecified osteoarthritis, unspecified site: Secondary | ICD-10-CM | POA: Insufficient documentation

## 2019-09-29 DIAGNOSIS — Z79899 Other long term (current) drug therapy: Secondary | ICD-10-CM | POA: Insufficient documentation

## 2019-09-29 DIAGNOSIS — E1136 Type 2 diabetes mellitus with diabetic cataract: Secondary | ICD-10-CM | POA: Insufficient documentation

## 2019-09-29 DIAGNOSIS — I10 Essential (primary) hypertension: Secondary | ICD-10-CM | POA: Diagnosis not present

## 2019-09-29 DIAGNOSIS — H25812 Combined forms of age-related cataract, left eye: Secondary | ICD-10-CM | POA: Diagnosis not present

## 2019-09-29 DIAGNOSIS — H2512 Age-related nuclear cataract, left eye: Secondary | ICD-10-CM | POA: Insufficient documentation

## 2019-09-29 HISTORY — PX: CATARACT EXTRACTION W/PHACO: SHX586

## 2019-09-29 HISTORY — DX: Polyneuropathy, unspecified: G62.9

## 2019-09-29 LAB — GLUCOSE, CAPILLARY
Glucose-Capillary: 113 mg/dL — ABNORMAL HIGH (ref 70–99)
Glucose-Capillary: 115 mg/dL — ABNORMAL HIGH (ref 70–99)

## 2019-09-29 SURGERY — PHACOEMULSIFICATION, CATARACT, WITH IOL INSERTION
Anesthesia: Monitor Anesthesia Care | Site: Eye | Laterality: Left

## 2019-09-29 MED ORDER — OXYCODONE HCL 5 MG/5ML PO SOLN: 5.0000 mg | Freq: Once | ORAL | Status: DC | PRN

## 2019-09-29 MED ORDER — MOXIFLOXACIN HCL 0.5 % OP SOLN
OPHTHALMIC | Status: DC | PRN
  Administered 2019-09-29: 0.2 mL via OPHTHALMIC

## 2019-09-29 MED ORDER — LACTATED RINGERS IV SOLN: INTRAVENOUS | Status: DC

## 2019-09-29 MED ORDER — MIDAZOLAM HCL 2 MG/2ML IJ SOLN
INTRAMUSCULAR | Status: DC | PRN
  Administered 2019-09-29: 1 mg via INTRAVENOUS

## 2019-09-29 MED ORDER — ACETAMINOPHEN 325 MG PO TABS
650.0000 mg | ORAL_TABLET | Freq: Once | ORAL | Status: AC
  Administered 2019-09-29: 650 mg via ORAL

## 2019-09-29 MED ORDER — MEPERIDINE HCL 25 MG/ML IJ SOLN: 6.2500 mg | INTRAMUSCULAR | Status: DC | PRN

## 2019-09-29 MED ORDER — OXYCODONE HCL 5 MG PO TABS: 5.0000 mg | ORAL_TABLET | Freq: Once | ORAL | Status: DC | PRN

## 2019-09-29 MED ORDER — SODIUM HYALURONATE 10 MG/ML IO SOLN
INTRAOCULAR | Status: DC | PRN
  Administered 2019-09-29: 0.55 mL via INTRAOCULAR

## 2019-09-29 MED ORDER — FENTANYL CITRATE (PF) 100 MCG/2ML IJ SOLN
INTRAMUSCULAR | Status: DC | PRN
Start: 2019-09-29 — End: 2019-09-29
  Administered 2019-09-29: 50 ug via INTRAVENOUS

## 2019-09-29 MED ORDER — FENTANYL CITRATE (PF) 100 MCG/2ML IJ SOLN: 25.0000 ug | INTRAMUSCULAR | Status: DC | PRN

## 2019-09-29 MED ORDER — SODIUM HYALURONATE 23 MG/ML IO SOLN
INTRAOCULAR | Status: DC | PRN
  Administered 2019-09-29: 0.6 mL via INTRAOCULAR

## 2019-09-29 MED ORDER — TETRACAINE HCL 0.5 % OP SOLN
1.0000 [drp] | OPHTHALMIC | Status: DC | PRN
  Administered 2019-09-29 (×3): 1 [drp] via OPHTHALMIC

## 2019-09-29 MED ORDER — ARMC OPHTHALMIC DILATING DROPS
1.0000 "application " | OPHTHALMIC | Status: DC | PRN
  Administered 2019-09-29 (×3): 1 via OPHTHALMIC

## 2019-09-29 MED ORDER — EPINEPHRINE PF 1 MG/ML IJ SOLN
INTRAOCULAR | Status: DC | PRN
  Administered 2019-09-29: 110 mL via OPHTHALMIC

## 2019-09-29 MED ORDER — PROMETHAZINE HCL 25 MG/ML IJ SOLN: 6.2500 mg | INTRAMUSCULAR | Status: DC | PRN

## 2019-09-29 MED ORDER — LIDOCAINE HCL (PF) 2 % IJ SOLN
INTRAOCULAR | Status: DC | PRN
  Administered 2019-09-29: 2 mL via INTRAOCULAR

## 2019-09-29 SURGICAL SUPPLY — 19 items
CANNULA ANT/CHMB 27G (MISCELLANEOUS) ×2 IMPLANT
CANNULA ANT/CHMB 27GA (MISCELLANEOUS) ×6 IMPLANT
DISSECTOR HYDRO NUCLEUS 50X22 (MISCELLANEOUS) ×3 IMPLANT
GLOVE SURG LX 7.5 STRW (GLOVE) ×2
GLOVE SURG LX STRL 7.5 STRW (GLOVE) ×1 IMPLANT
GLOVE SURG SYN 8.5  E (GLOVE) ×3
GLOVE SURG SYN 8.5 E (GLOVE) ×1 IMPLANT
GLOVE SURG SYN 8.5 PF PI (GLOVE) ×1 IMPLANT
GOWN STRL REUS W/ TWL LRG LVL3 (GOWN DISPOSABLE) ×2 IMPLANT
GOWN STRL REUS W/TWL LRG LVL3 (GOWN DISPOSABLE) ×6
LENS IOL TECNIS EYHANCE 21.5 ×2 IMPLANT
MARKER SKIN DUAL TIP RULER LAB (MISCELLANEOUS) ×3 IMPLANT
PACK DR. KING ARMS (PACKS) ×3 IMPLANT
PACK EYE AFTER SURG (MISCELLANEOUS) ×3 IMPLANT
PACK OPTHALMIC (MISCELLANEOUS) ×3 IMPLANT
SYR 3ML LL SCALE MARK (SYRINGE) ×3 IMPLANT
SYR TB 1ML LUER SLIP (SYRINGE) ×3 IMPLANT
WATER STERILE IRR 250ML POUR (IV SOLUTION) ×3 IMPLANT
WIPE NON LINTING 3.25X3.25 (MISCELLANEOUS) ×3 IMPLANT

## 2019-09-29 NOTE — Anesthesia Postprocedure Evaluation (Signed)
Anesthesia Post Note  Patient: Patricia Horne  Procedure(s) Performed: CATARACT EXTRACTION PHACO AND INTRAOCULAR LENS PLACEMENT (IOC) LEFT DIABETIC 3.42 00:32.9 (Left Eye)     Patient location during evaluation: PACU Anesthesia Type: MAC Level of consciousness: awake and alert Pain management: pain level controlled Vital Signs Assessment: post-procedure vital signs reviewed and stable Respiratory status: spontaneous breathing, nonlabored ventilation, respiratory function stable and patient connected to nasal cannula oxygen Cardiovascular status: stable and blood pressure returned to baseline Postop Assessment: no apparent nausea or vomiting Anesthetic complications: no   No complications documented.  Yosgart Pavey, Glade Stanford

## 2019-09-29 NOTE — H&P (Signed)

## 2019-09-29 NOTE — Anesthesia Preprocedure Evaluation (Signed)
Anesthesia Evaluation  Patient identified by MRN, date of birth, ID band Patient awake    Reviewed: Allergy & Precautions, H&P , Patient's Chart, lab work & pertinent test results  History of Anesthesia Complications Negative for: history of anesthetic complications  Airway Mallampati: II  TM Distance: >3 FB     Dental no notable dental hx.    Pulmonary neg pulmonary ROS,    Pulmonary exam normal        Cardiovascular hypertension, Normal cardiovascular exam+ Valvular Problems/Murmurs      Neuro/Psych PSYCHIATRIC DISORDERS Anxiety  Neuromuscular disease    GI/Hepatic negative GI ROS, Neg liver ROS,   Endo/Other  diabetes  Renal/GU      Musculoskeletal   Abdominal   Peds  Hematology negative hematology ROS (+)   Anesthesia Other Findings   Reproductive/Obstetrics negative OB ROS                             Anesthesia Physical Anesthesia Plan  ASA: III  Anesthesia Plan: MAC   Post-op Pain Management:    Induction:   PONV Risk Score and Plan: 2 and TIVA, Midazolam and Treatment may vary due to age or medical condition  Airway Management Planned:   Additional Equipment:   Intra-op Plan:   Post-operative Plan:   Informed Consent: I have reviewed the patients History and Physical, chart, labs and discussed the procedure including the risks, benefits and alternatives for the proposed anesthesia with the patient or authorized representative who has indicated his/her understanding and acceptance.     Dental Advisory Given  Plan Discussed with: CRNA  Anesthesia Plan Comments:         Anesthesia Quick Evaluation

## 2019-09-29 NOTE — Transfer of Care (Signed)
Immediate Anesthesia Transfer of Care Note  Patient: Patricia Horne  Procedure(s) Performed: CATARACT EXTRACTION PHACO AND INTRAOCULAR LENS PLACEMENT (IOC) LEFT DIABETIC 3.42 00:32.9 (Left Eye)  Patient Location: PACU  Anesthesia Type: MAC  Level of Consciousness: awake, alert  and patient cooperative  Airway and Oxygen Therapy: Patient Spontanous Breathing and Patient connected to supplemental oxygen  Post-op Assessment: Post-op Vital signs reviewed, Patient's Cardiovascular Status Stable, Respiratory Function Stable, Patent Airway and No signs of Nausea or vomiting  Post-op Vital Signs: Reviewed and stable  Complications: No complications documented.

## 2019-09-29 NOTE — Anesthesia Procedure Notes (Signed)
Procedure Name: MAC Date/Time: 09/29/2019 8:03 AM Performed by: Silvana Newness, CRNA Pre-anesthesia Checklist: Patient identified, Emergency Drugs available, Suction available, Patient being monitored and Timeout performed Patient Re-evaluated:Patient Re-evaluated prior to induction Oxygen Delivery Method: Nasal cannula Placement Confirmation: positive ETCO2

## 2019-09-29 NOTE — Op Note (Signed)
OPERATIVE NOTE  Patricia Horne [id number] 09/29/2019   PREOPERATIVE DIAGNOSIS:  Nuclear sclerotic cataract left eye.  H25.12   POSTOPERATIVE DIAGNOSIS:    Nuclear sclerotic cataract left eye.     PROCEDURE:  Phacoemusification with posterior chamber intraocular lens placement of the left eye   LENS:   Implant Name Type Inv. Item Serial No. Manufacturer Lot No. LRB No. Used Action  LENS II EYHANCE 21.5 - [id number]  LENS II EYHANCE 21.5 [id number] JOHNSON   Left 1 Implanted      Procedure(s): CATARACT EXTRACTION PHACO AND INTRAOCULAR LENS PLACEMENT (IOC) LEFT DIABETIC 3.42 00:32.9 (Left)  DIB00 +21.5   ULTRASOUND TIME: 0 minutes 32.9 seconds.  CDE 3.42   SURGEON:  Benay Pillow, MD, MPH   ANESTHESIA:  Topical with tetracaine drops augmented with 1% preservative-free intracameral lidocaine.  ESTIMATED BLOOD LOSS: <1 mL   COMPLICATIONS:  None.   DESCRIPTION OF PROCEDURE:  The patient was identified in the holding room and transported to the operating room and placed in the supine position under the operating microscope.  The left eye was identified as the operative eye and it was prepped and draped in the usual sterile ophthalmic fashion.   A 1.0 millimeter clear-corneal paracentesis was made at the 5:00 position. 0.5 ml of preservative-free 1% lidocaine with epinephrine was injected into the anterior chamber.  The anterior chamber was filled with Healon 5 viscoelastic.  A 2.4 millimeter keratome was used to make a near-clear corneal incision at the 2:00 position.  A curvilinear capsulorrhexis was made with a cystotome and capsulorrhexis forceps.  Balanced salt solution was used to hydrodissect and hydrodelineate the nucleus.   Phacoemulsification was then used in stop and chop fashion to remove the lens nucleus and epinucleus.  The remaining cortex was then removed using the irrigation and aspiration handpiece. Healon was then placed into the capsular bag to distend it for lens  placement.  A lens was then injected into the capsular bag.  The remaining viscoelastic was aspirated.   Wounds were hydrated with balanced salt solution.  The anterior chamber was inflated to a physiologic pressure with balanced salt solution.   Intracameral vigamox 0.1 mL undiltued was injected into the eye and a drop placed onto the ocular surface.  No wound leaks were noted.  The patient was taken to the recovery room in stable condition without complications of anesthesia or surgery  Benay Pillow 09/29/2019, 8:23 AM

## 2019-09-30 ENCOUNTER — Encounter: Payer: Self-pay | Admitting: Ophthalmology

## 2019-10-16 ENCOUNTER — Ambulatory Visit: Payer: PPO | Admitting: Cardiology

## 2019-10-16 ENCOUNTER — Encounter: Payer: Self-pay | Admitting: Cardiology

## 2019-10-16 ENCOUNTER — Other Ambulatory Visit: Payer: Self-pay

## 2019-10-16 VITALS — BP 138/78 | HR 82 | Ht 61.0 in | Wt 173.6 lb

## 2019-10-16 DIAGNOSIS — R5383 Other fatigue: Secondary | ICD-10-CM

## 2019-10-16 DIAGNOSIS — R0602 Shortness of breath: Secondary | ICD-10-CM

## 2019-10-16 DIAGNOSIS — I10 Essential (primary) hypertension: Secondary | ICD-10-CM

## 2019-10-16 NOTE — Progress Notes (Signed)
Cardiology Office Note:    Date:  10/16/2019   ID:  [name], DOB 05-Jun-1950, MRN [id number]  PCP:  Jinny Sanders, MD  [facility] Cardiologist:  Kate Sable, MD  [place] Electrophysiologist:  None   Referring MD: Jinny Sanders, MD   Chief Complaint  Patient presents with  . New Patient (Initial Visit)    Establish care with provider for extreme fatigue and hypertension. Medications verbally reviewed with patient.    Patricia Horne is a 69 y.o. female who is being seen today for the evaluation of shortness of breath and fatigue at the request of Diona Browner, Mervyn Gay, MD.   History of Present Illness:    Patricia Horne is a 69 y.o. female with a hx of hypertension, diabetes who presents due to fatigue.  Patient states having symptoms of generalized fatigue and shortness of breath over the past year.  Walking her usual distances causing her to be winded and out of breath.  She has also noticed overall fatigue causing her not to do activities she normally does.  She denies depression.  Denies any history of heart disease.  Denies prior smoking.  Denies chest pain, dizziness, palpitations, edema.  She saw her primary care provider who did lab work.  Thyroid function was normal.  Her vitamin D levels were noted to be low and are currently being repleted.   She had an ETT on 07/2014 which was normal.  Past Medical History:  Diagnosis Date  . Abdominal pain, left lower quadrant   . Acute upper respiratory infections of unspecified site   . Allergic genetic state   . Arthritis    in arms  . Glaucoma   . Lipoma of unspecified site   . Neuropathy   . Obesity   . Other and unspecified noninfectious gastroenteritis and colitis(558.9)   . Other screening mammogram   . Routine general medical examination at a health care facility   . Screening for lipoid disorders   . Special screening for osteoporosis   . Tibialis tendinitis   . Tubular adenoma of colon  03/31/2014  . Type II or unspecified type diabetes mellitus without mention of complication, not stated as uncontrolled   . Undiagnosed cardiac murmurs   . Unspecified essential hypertension     Past Surgical History:  Procedure Laterality Date  . CATARACT EXTRACTION W/PHACO Left 09/29/2019   Procedure: CATARACT EXTRACTION PHACO AND INTRAOCULAR LENS PLACEMENT (IOC) LEFT DIABETIC 3.42 00:32.9;  Surgeon: Eulogio Bear, MD;  Location: [facility];  Service: Ophthalmology;  Laterality: Left;  . COLONOSCOPY  04/01/2004  . COLONOSCOPY  03/31/2014  . COLONOSCOPY WITH PROPOFOL N/A 01/01/2018   Procedure: COLONOSCOPY WITH PROPOFOL;  Surgeon: Lollie Sails, MD;  Location[facility] ENDOSCOPY;  Service: Endoscopy;  Laterality: N/A;  . LIPOMA EXCISION Left 2010   arm  . TOE SURGERY  1990's   bunion, hammer toe repair  . TOTAL ABDOMINAL HYSTERECTOMY  1983   one ovary removed, later other ovary removed    Current Medications: Current Meds  Medication Sig  . acetaminophen (TYLENOL) 500 MG tablet Take 500 mg by mouth every 6 (six) hours as needed.  Marland Kitchen amLODipine (NORVASC) 10 MG tablet Take 1 tablet (10 mg total) by mouth daily.  . Cholecalciferol (VITAMIN D3) 1.25 MG (50000 UT) CAPS 50, 000 units weekly  . Continuous Blood Gluc Receiver (FREESTYLE LIBRE READER) DEVI 1 each by Does not apply route 3 (three) times daily as needed.  Marland Kitchen  Continuous Blood Gluc Sensor (FREESTYLE LIBRE 14 DAY SENSOR) MISC 1 Units by Does not apply route every 3 (three) days as needed.  . Dulaglutide (TRULICITY) 0.16 WF/0.9NA SOPN Inject 0.75 mg into the skin once a week.  . gabapentin (NEURONTIN) 100 MG capsule Take 100 mg by mouth at bedtime. Start 100 mg at bedtime but can increase to 200 mg or 300 mg if tolerated   . glucose monitoring kit (FREESTYLE) monitoring kit 1 each by Does not apply route as needed for other.  . Insulin Pen Needle (PEN NEEDLES) 31G X 8 MM MISC Pen needles for Trulicity, T55.732  .  loperamide (IMODIUM) 2 MG capsule Take by mouth as needed for diarrhea or loose stools.  Marland Kitchen losartan (COZAAR) 50 MG tablet TAKE 1 TABLET BY MOUTH EVERY DAY     Allergies:   Patient has no known allergies.   Social History   Socioeconomic History  . Marital status: Single    Spouse name: Not on file  . Number of children: 0  . Years of education: Not on file  . Highest education level: Not on file  Occupational History  . Occupation: Control and instrumentation engineer, Museum/gallery curator at [employer]  . Smoking status: Never Smoker  . Smokeless tobacco: Never Used  Vaping Use  . Vaping Use: Never used  Substance and Sexual Activity  . Alcohol use: No    Alcohol/week: 0.0 standard drinks  . Drug use: No  . Sexual activity: Not Currently  Other Topics Concern  . Not on file  Social History Narrative   Caffeine use: soda/Pepsi   Fast food.  Regular exercise at work.   No sex.   Social Determinants of Health   Financial Resource Strain:   . Difficulty of Paying Living Expenses: Not on file  Food Insecurity:   . Worried About Charity fundraiser in the Last Year: Not on file  . Ran Out of Food in the Last Year: Not on file  Transportation Needs:   . Lack of Transportation (Medical): Not on file  . Lack of Transportation (Non-Medical): Not on file  Physical Activity:   . Days of Exercise per Week: Not on file  . Minutes of Exercise per Session: Not on file  Stress:   . Feeling of Stress : Not on file  Social Connections:   . Frequency of Communication with Friends and Family: Not on file  . Frequency of Social Gatherings with Friends and Family: Not on file  . Attends Religious Services: Not on file  . Active Member of Clubs or Organizations: Not on file  . Attends Archivist Meetings: Not on file  . Marital Status: Not on file     Family History: The patient's family history includes Arthritis in her father; Colon polyps in her paternal grandfather; Diabetes in her brother  and mother; Ulcerative colitis in her paternal grandmother. There is no history of Breast cancer.  ROS:   Please see the history of present illness.     All other systems reviewed and are negative.  EKGs/Labs/Other Studies Reviewed:    The following studies were reviewed today:   EKG:  EKG is  ordered today.  The ekg ordered today demonstrates normal sinus rhythm, normal ECG.  Recent Labs: 09/19/2019: ALT 12; BUN 25; Creatinine, Ser 0.87; Potassium 4.1; Sodium 142 09/23/2019: Hemoglobin 12.8; Platelets 267.0; TSH 1.07  Recent Lipid Panel    Component Value Date/Time   CHOL 159 09/19/2019 0910  TRIG 58.0 09/19/2019 0910   HDL 50.50 09/19/2019 0910   CHOLHDL 3 09/19/2019 0910   VLDL 11.6 09/19/2019 0910   LDLCALC 97 09/19/2019 0910    Physical Exam:    VS:  BP 138/78 (BP Location: Right Arm, Patient Position: Sitting, Cuff Size: Normal)   Pulse 82   Ht 5' 1"  (1.549 m)   Wt 173 lb 9.6 oz (78.7 kg)   SpO2 98%   BMI 32.80 kg/m     Wt Readings from Last 3 Encounters:  10/16/19 173 lb 9.6 oz (78.7 kg)  09/29/19 173 lb (78.5 kg)  09/23/19 172 lb 8 oz (78.2 kg)     GEN:  Well nourished, well developed in no acute distress HEENT: Normal NECK: No JVD; No carotid bruits LYMPHATICS: No lymphadenopathy CARDIAC: RRR, no murmurs, rubs, gallops RESPIRATORY:  Clear to auscultation without rales, wheezing or rhonchi  ABDOMEN: Soft, non-tender, non-distended MUSCULOSKELETAL:  No edema; No deformity  SKIN: Warm and dry NEUROLOGIC:  Alert and oriented x 3 PSYCHIATRIC:  Normal affect   ASSESSMENT:    1. Shortness of breath   2. Fatigue, unspecified type   3. Essential hypertension    PLAN:    In order of problems listed above:  1. Patient with shortness of breath sometimes associated with exertion.  Patient denies chest pain.  Get echocardiogram to evaluate systolic and diastolic function.  I do not believe this is an anginal equivalent at this point.  If symptoms persist  after repletion of vitamin D, or abnormalities noted on echo, we will consider stress test. 2. Patient with generalized fatigue, vitamin D deficiency have been reported to be associated with fatigue.  Replete as per primary care provider.  If symptoms persist, management as above. 3. History of hypertension, BP controlled.  Continue current BP meds.  Follow-up after echocardiogram.   Medication Adjustments/Labs and Tests Ordered: Current medicines are reviewed at length with the patient today.  Concerns regarding medicines are outlined above.  Orders Placed This Encounter  Procedures  . EKG 12-Lead  . ECHOCARDIOGRAM COMPLETE   No orders of the defined types were placed in this encounter.   Patient Instructions  Medication Instructions:  Your physician recommends that you continue on your current medications as directed. Please refer to the Current Medication list given to you today.  *If you need a refill on your cardiac medications before your next appointment, please call your pharmacy*   Lab Work: None Ordered If you have labs (blood work) drawn today and your tests are completely normal, you will receive your results only by: Marland Kitchen MyChart Message (if you have MyChart) OR . A paper copy in the mail If you have any lab test that is abnormal or we need to change your treatment, we will call you to review the results.   Testing/Procedures:  Your physician has requested that you have an echocardiogram. Echocardiography is a painless test that uses sound waves to create images of your heart. It provides your doctor with information about the size and shape of your heart and how well your heart's chambers and valves are working. This procedure takes approximately one hour. There are no restrictions for this procedure.    Follow-Up: At [facility], you and your health needs are our priority.  As part of our continuing mission to provide you with exceptional heart care, we have  created designated Provider Care Teams.  These Care Teams include your primary Cardiologist (physician) and Advanced Practice Providers (APPs -  Physician Assistants and Nurse Practitioners) who all work together to provide you with the care you need, when you need it.  We recommend signing up for the patient portal called "MyChart".  Sign up information is provided on this After Visit Summary.  MyChart is used to connect with patients for Virtual Visits (Telemedicine).  Patients are able to view lab/test results, encounter notes, upcoming appointments, etc.  Non-urgent messages can be sent to your provider as well.   To learn more about what you can do with MyChart, go to [email or web address].    Your next appointment:   After Echo   The format for your next appointment:   In Person  Provider:   Kate Sable, MD   Other Instructions       Signed, Kate Sable, MD  10/16/2019 12:56 PM    [place]

## 2019-10-16 NOTE — Patient Instructions (Addendum)
Medication Instructions:  Your physician recommends that you continue on your current medications as directed. Please refer to the Current Medication list given to you today.  *If you need a refill on your cardiac medications before your next appointment, please call your pharmacy*   Lab Work: None Ordered If you have labs (blood work) drawn today and your tests are completely normal, you will receive your results only by: Marland Kitchen MyChart Message (if you have MyChart) OR . A paper copy in the mail If you have any lab test that is abnormal or we need to change your treatment, we will call you to review the results.   Testing/Procedures:  Your physician has requested that you have an echocardiogram. Echocardiography is a painless test that uses sound waves to create images of your heart. It provides your doctor with information about the size and shape of your heart and how well your heart's chambers and valves are working. This procedure takes approximately one hour. There are no restrictions for this procedure.    Follow-Up: At [facility], you and your health needs are our priority.  As part of our continuing mission to provide you with exceptional heart care, we have created designated Provider Care Teams.  These Care Teams include your primary Cardiologist (physician) and Advanced Practice Providers (APPs -  Physician Assistants and Nurse Practitioners) who all work together to provide you with the care you need, when you need it.  We recommend signing up for the patient portal called "MyChart".  Sign up information is provided on this After Visit Summary.  MyChart is used to connect with patients for Virtual Visits (Telemedicine).  Patients are able to view lab/test results, encounter notes, upcoming appointments, etc.  Non-urgent messages can be sent to your provider as well.   To learn more about what you can do with MyChart, go to [email or web address].    Your next appointment:    After Echo   The format for your next appointment:   In Person  Provider:   Kate Sable, MD   Other Instructions

## 2019-10-22 DIAGNOSIS — E113313 Type 2 diabetes mellitus with moderate nonproliferative diabetic retinopathy with macular edema, bilateral: Secondary | ICD-10-CM | POA: Diagnosis not present

## 2019-10-29 ENCOUNTER — Other Ambulatory Visit: Payer: Self-pay | Admitting: Family Medicine

## 2019-11-03 NOTE — Assessment & Plan Note (Signed)
Stable control on current regimen. 

## 2019-11-03 NOTE — Assessment & Plan Note (Signed)
Good control when compliant with meds. Encouraged compliance.

## 2019-11-03 NOTE — Assessment & Plan Note (Signed)
Good control on trulicity.

## 2019-11-03 NOTE — Assessment & Plan Note (Signed)
Concerning for cardiac cause.. refer back to cardiology for possible further testing.

## 2019-11-12 ENCOUNTER — Other Ambulatory Visit: Payer: Self-pay | Admitting: Family Medicine

## 2019-11-12 ENCOUNTER — Other Ambulatory Visit: Payer: Self-pay

## 2019-11-12 ENCOUNTER — Ambulatory Visit (INDEPENDENT_AMBULATORY_CARE_PROVIDER_SITE_OTHER): Payer: PPO

## 2019-11-12 DIAGNOSIS — IMO0002 Reserved for concepts with insufficient information to code with codable children: Secondary | ICD-10-CM

## 2019-11-12 DIAGNOSIS — R0602 Shortness of breath: Secondary | ICD-10-CM | POA: Diagnosis not present

## 2019-11-12 DIAGNOSIS — E1349 Other specified diabetes mellitus with other diabetic neurological complication: Secondary | ICD-10-CM

## 2019-11-12 LAB — ECHOCARDIOGRAM COMPLETE
AR max vel: 2.66 cm2
AV Area VTI: 2.85 cm2
AV Area mean vel: 3.02 cm2
AV Mean grad: 3 mmHg
AV Peak grad: 6.4 mmHg
Ao pk vel: 1.26 m/s
Area-P 1/2: 4.21 cm2
S' Lateral: 3 cm

## 2019-11-13 ENCOUNTER — Other Ambulatory Visit: Payer: Self-pay | Admitting: Family Medicine

## 2019-11-19 DIAGNOSIS — E113313 Type 2 diabetes mellitus with moderate nonproliferative diabetic retinopathy with macular edema, bilateral: Secondary | ICD-10-CM | POA: Diagnosis not present

## 2019-11-21 ENCOUNTER — Other Ambulatory Visit: Payer: Self-pay

## 2019-11-21 ENCOUNTER — Encounter: Payer: Self-pay | Admitting: Cardiology

## 2019-11-21 ENCOUNTER — Ambulatory Visit: Payer: PPO | Admitting: Cardiology

## 2019-11-21 VITALS — BP 166/102 | HR 98 | Ht 61.0 in | Wt 174.0 lb

## 2019-11-21 DIAGNOSIS — I10 Essential (primary) hypertension: Secondary | ICD-10-CM | POA: Diagnosis not present

## 2019-11-21 DIAGNOSIS — R0602 Shortness of breath: Secondary | ICD-10-CM | POA: Diagnosis not present

## 2019-11-21 NOTE — Progress Notes (Signed)
Cardiology Office Note:    Date:  11/21/2019   ID:  Patricia Horne, DOB Mar 28, 1950, MRN [id number]  PCP:  Jinny Sanders, MD  [facility] Cardiologist:  Kate Sable, MD  [place] Electrophysiologist:  None   Referring MD: Jinny Sanders, MD   Chief Complaint  Patient presents with  . Follow-up    Echo  Pt states she got a little dizzy last Friday, had not taken BP med in a few days; no other Sx.     History of Present Illness:    Patricia Horne is a 69 y.o. female with a hx of hypertension, diabetes who presents for follow-up. Last seen due to shortness of breath and fatigue. Also complaining of vitamin deficiency which were being repleted by primary. Echocardiogram was ordered to evaluate cardiac function. Her symptoms were not deemed anginal equivalent. She now presents for results. Had an episode of dizziness a week ago, states her blood sugars at the time was normal around 126.  She did not check her blood pressure.  She stopped taking her blood pressure medications 3 days ago.  States feeling okay.  Prior notes She had an ETT on 07/2014 which was normal.  Past Medical History:  Diagnosis Date  . Abdominal pain, left lower quadrant   . Acute upper respiratory infections of unspecified site   . Allergic genetic state   . Arthritis    in arms  . Glaucoma   . Lipoma of unspecified site   . Neuropathy   . Obesity   . Other and unspecified noninfectious gastroenteritis and colitis(558.9)   . Other screening mammogram   . Routine general medical examination at a health care facility   . Screening for lipoid disorders   . Special screening for osteoporosis   . Tibialis tendinitis   . Tubular adenoma of colon 03/31/2014  . Type II or unspecified type diabetes mellitus without mention of complication, not stated as uncontrolled   . Undiagnosed cardiac murmurs   . Unspecified essential hypertension     Past Surgical History:  Procedure Laterality Date   . CATARACT EXTRACTION W/PHACO Left 09/29/2019   Procedure: CATARACT EXTRACTION PHACO AND INTRAOCULAR LENS PLACEMENT (IOC) LEFT DIABETIC 3.42 00:32.9;  Surgeon: Eulogio Bear, MD;  Location: [facility];  Service: Ophthalmology;  Laterality: Left;  . COLONOSCOPY  04/01/2004  . COLONOSCOPY  03/31/2014  . COLONOSCOPY WITH PROPOFOL N/A 01/01/2018   Procedure: COLONOSCOPY WITH PROPOFOL;  Surgeon: Lollie Sails, MD;  Location: [facility] ENDOSCOPY;  Service: Endoscopy;  Laterality: N/A;  . LIPOMA EXCISION Left 2010   arm  . TOE SURGERY  1990's   bunion, hammer toe repair  . TOTAL ABDOMINAL HYSTERECTOMY  1983   one ovary removed, later other ovary removed    Current Medications: No outpatient medications have been marked as taking for the 11/21/19 encounter (Office Visit) with Kate Sable, MD.     Allergies:   Patient has no known allergies.   Social History   Socioeconomic History  . Marital status: Single    Spouse name: Not on file  . Number of children: 0  . Years of education: Not on file  . Highest education level: Not on file  Occupational History  . Occupation: Control and instrumentation engineer, Museum/gallery curator at [employer]  . Smoking status: Never Smoker  . Smokeless tobacco: Never Used  Vaping Use  . Vaping Use: Never used  Substance and Sexual Activity  . Alcohol use: No  Alcohol/week: 0.0 standard drinks  . Drug use: No  . Sexual activity: Not Currently  Other Topics Concern  . Not on file  Social History Narrative   Caffeine use: soda/Pepsi   Fast food.  Regular exercise at work.   No sex.   Social Determinants of Health   Financial Resource Strain:   . Difficulty of Paying Living Expenses: Not on file  Food Insecurity:   . Worried About Charity fundraiser in the Last Year: Not on file  . Ran Out of Food in the Last Year: Not on file  Transportation Needs:   . Lack of Transportation (Medical): Not on file  . Lack of Transportation (Non-Medical):  Not on file  Physical Activity:   . Days of Exercise per Week: Not on file  . Minutes of Exercise per Session: Not on file  Stress:   . Feeling of Stress : Not on file  Social Connections:   . Frequency of Communication with Friends and Family: Not on file  . Frequency of Social Gatherings with Friends and Family: Not on file  . Attends Religious Services: Not on file  . Active Member of Clubs or Organizations: Not on file  . Attends Archivist Meetings: Not on file  . Marital Status: Not on file     Family History: The patient's family history includes Arthritis in her father; Colon polyps in her paternal grandfather; Diabetes in her brother and mother; Ulcerative colitis in her paternal grandmother. There is no history of Breast cancer.  ROS:   Please see the history of present illness.     All other systems reviewed and are negative.  EKGs/Labs/Other Studies Reviewed:    The following studies were reviewed today:   EKG:  EKG not  ordered today.   Recent Labs: 09/19/2019: ALT 12; BUN 25; Creatinine, Ser 0.87; Potassium 4.1; Sodium 142 09/23/2019: Hemoglobin 12.8; Platelets 267.0; TSH 1.07  Recent Lipid Panel    Component Value Date/Time   CHOL 159 09/19/2019 0910   TRIG 58.0 09/19/2019 0910   HDL 50.50 09/19/2019 0910   CHOLHDL 3 09/19/2019 0910   VLDL 11.6 09/19/2019 0910   LDLCALC 97 09/19/2019 0910    Physical Exam:    VS:  BP (!) 166/102   Pulse 98   Ht 5\' 1"  (1.549 m)   Wt 174 lb (78.9 kg)   BMI 32.88 kg/m     Wt Readings from Last 3 Encounters:  11/21/19 174 lb (78.9 kg)  10/16/19 173 lb 9.6 oz (78.7 kg)  09/29/19 173 lb (78.5 kg)     GEN:  Well nourished, well developed in no acute distress HEENT: Normal NECK: No JVD; No carotid bruits LYMPHATICS: No lymphadenopathy CARDIAC: RRR, no murmurs, rubs, gallops RESPIRATORY:  Clear to auscultation without rales, wheezing or rhonchi  ABDOMEN: Soft, non-tender, non-distended MUSCULOSKELETAL:   No edema; No deformity  SKIN: Warm and dry NEUROLOGIC:  Alert and oriented x 3 PSYCHIATRIC:  Normal affect   ASSESSMENT:    1. Shortness of breath   2. Essential hypertension    PLAN:    In order of problems listed above:  1. Patient with shortness of breath sometimes associated with exertion.  Patient denies chest pain. Echocardiogram shows normal systolic function, impaired relaxation. Impaired relaxation possibly from hypertension. No grossly abnormal findings. Patient made aware of results.  Was again counseled on graduated exercising, including daily walks as much as tolerated.  2. History of hypertension, BP typically controlled, elevated  today. Medication compliance advised. Monitor BP at home closely.  Restart losartan first in light of patient being a diabetic.  Check blood pressure over the next week.  If blood pressure stays elevated, restart amlodipine.  Follow-up in 1 month    Medication Adjustments/Labs and Tests Ordered: Current medicines are reviewed at length with the patient today.  Concerns regarding medicines are outlined above.  No orders of the defined types were placed in this encounter.  No orders of the defined types were placed in this encounter.   Patient Instructions  Medication Instructions:  Your physician recommends that you continue on your current medications as directed. Please refer to the Current Medication list given to you today.  RESUME Losartan 50 mg daily.  Monitor your blood pressure daily for a week. If you blood pressure is still elevated RESUME Amlodipine 10 mg daily.   *If you need a refill on your cardiac medications before your next appointment, please call your pharmacy*   Lab Work: None ordered If you have labs (blood work) drawn today and your tests are completely normal, you will receive your results only by: Marland Kitchen MyChart Message (if you have MyChart) OR . A paper copy in the mail If you have any lab test that is abnormal  or we need to change your treatment, we will call you to review the results.   Testing/Procedures: None ordered   Follow-Up: At [facility], you and your health needs are our priority.  As part of our continuing mission to provide you with exceptional heart care, we have created designated Provider Care Teams.  These Care Teams include your primary Cardiologist (physician) and Advanced Practice Providers (APPs -  Physician Assistants and Nurse Practitioners) who all work together to provide you with the care you need, when you need it.  We recommend signing up for the patient portal called "MyChart".  Sign up information is provided on this After Visit Summary.  MyChart is used to connect with patients for Virtual Visits (Telemedicine).  Patients are able to view lab/test results, encounter notes, upcoming appointments, etc.  Non-urgent messages can be sent to your provider as well.   To learn more about what you can do with MyChart, go to [email or web address].    Your next appointment:   4 week(s)  The format for your next appointment:   In Person  Provider:   Kate Sable, MD   Other Instructions N/A     Signed, Kate Sable, MD  11/21/2019 12:53 PM    [place]

## 2019-11-21 NOTE — Patient Instructions (Signed)
Medication Instructions:  Your physician recommends that you continue on your current medications as directed. Please refer to the Current Medication list given to you today.  RESUME Losartan 50 mg daily.  Monitor your blood pressure daily for a week. If you blood pressure is still elevated RESUME Amlodipine 10 mg daily.   *If you need a refill on your cardiac medications before your next appointment, please call your pharmacy*   Lab Work: None ordered If you have labs (blood work) drawn today and your tests are completely normal, you will receive your results only by: Marland Kitchen MyChart Message (if you have MyChart) OR . A paper copy in the mail If you have any lab test that is abnormal or we need to change your treatment, we will call you to review the results.   Testing/Procedures: None ordered   Follow-Up: At [facility], you and your health needs are our priority.  As part of our continuing mission to provide you with exceptional heart care, we have created designated Provider Care Teams.  These Care Teams include your primary Cardiologist (physician) and Advanced Practice Providers (APPs -  Physician Assistants and Nurse Practitioners) who all work together to provide you with the care you need, when you need it.  We recommend signing up for the patient portal called "MyChart".  Sign up information is provided on this After Visit Summary.  MyChart is used to connect with patients for Virtual Visits (Telemedicine).  Patients are able to view lab/test results, encounter notes, upcoming appointments, etc.  Non-urgent messages can be sent to your provider as well.   To learn more about what you can do with MyChart, go to [email or web address].    Your next appointment:   4 week(s)  The format for your next appointment:   In Person  Provider:   Kate Sable, MD   Other Instructions N/A

## 2019-12-11 ENCOUNTER — Telehealth: Payer: Self-pay | Admitting: Family Medicine

## 2019-12-11 NOTE — Progress Notes (Signed)
  Chronic Care Management   Outreach Note  12/11/2019 Name: Patricia Horne MRN: [id number] DOB: 1950-09-14  Referred by: Jinny Sanders, MD Reason for referral : Chronic Care Management   An unsuccessful telephone outreach was attempted today. The patient was referred to the pharmacist for assistance with care management and care coordination.   Follow Up Plan:   Hilario Quarry  Upstream Scheduler

## 2019-12-12 ENCOUNTER — Ambulatory Visit: Payer: PPO

## 2019-12-12 ENCOUNTER — Telehealth: Payer: Self-pay | Admitting: Family Medicine

## 2019-12-12 NOTE — Chronic Care Management (AMB) (Signed)
  Chronic Care Management   Note  12/12/2019 Name: Patricia Horne MRN: [id number] DOB: [birth year]  Patricia Horne is a 69 y.o. year old female who is a primary care patient of Bedsole, Amy E, MD. I reached out to [employer] by phone today in response to a referral sent by Patricia Horne's PCP, Jinny Sanders, MD.   Patricia Horne was given information about Chronic Care Management services today including:  1. CCM service includes personalized support from designated clinical staff supervised by her physician, including individualized plan of care and coordination with other care providers 2. 24/7 contact phone numbers for assistance for urgent and routine care needs. 3. Service will only be billed when office clinical staff spend 20 minutes or more in a month to coordinate care. 4. Only one practitioner may furnish and bill the service in a calendar month. 5. The patient may stop CCM services at any time (effective at the end of the month) by phone call to the office staff.   Patient agreed to services and verbal consent obtained.   Follow up plan:   [place]

## 2019-12-19 ENCOUNTER — Other Ambulatory Visit: Payer: Self-pay

## 2019-12-19 ENCOUNTER — Encounter: Payer: Self-pay | Admitting: Cardiology

## 2019-12-19 ENCOUNTER — Ambulatory Visit: Payer: PPO | Admitting: Cardiology

## 2019-12-19 VITALS — BP 170/90 | HR 83 | Ht 61.0 in | Wt 174.2 lb

## 2019-12-19 DIAGNOSIS — I10 Essential (primary) hypertension: Secondary | ICD-10-CM

## 2019-12-19 DIAGNOSIS — H538 Other visual disturbances: Secondary | ICD-10-CM | POA: Diagnosis not present

## 2019-12-19 DIAGNOSIS — R0602 Shortness of breath: Secondary | ICD-10-CM | POA: Diagnosis not present

## 2019-12-19 MED ORDER — LOSARTAN POTASSIUM 50 MG PO TABS
50.0000 mg | ORAL_TABLET | Freq: Two times a day (BID) | ORAL | 6 refills | Status: DC
Start: 2019-12-19 — End: 2020-01-27

## 2019-12-19 NOTE — Progress Notes (Signed)
Cardiology Office Note:    Date:  12/19/2019   ID:  Patricia Horne, DOB 1950-07-18, MRN [id number]  PCP:  Patricia Sanders, MD  [facility] Cardiologist:  Patricia Sable, MD  [place] Electrophysiologist:  None   Referring MD: Patricia Sanders, MD   Chief Complaint  Patient presents with  . Follow-up    4 week follow up. Meds reviewed by the pt. verbally. Pt. c/o blurred vision with feeling like could black out.      History of Present Illness:    Patricia Horne is a 69 y.o. female with a hx of hypertension, diabetes who presents for follow-up. Last seen due to shortness of breath and fatigue.  Her shortness of breath was deemed secondary to deconditioning.  Graduated exercise advised which patient has been doing.  She states taking baby steps but symptoms are slightly improved.  She has however noticed dizziness and blurred visions at times.  Denies any changes in medications apart from vitamin supplements.  Previously did not take her BP meds as prescribed, recently started taking blood pressure medications in the summer/June July 2021.  Takes both medications in the morning.  Blurry vision typically occurs with standing from sitting position or walking sometimes.  This typically lasts a couple of minutes and then resolve.   Prior notes She had an ETT on 07/2014 which was normal. Echo 40/3474, normal systolic function, EF 25-95, impaired relaxation.  Past Medical History:  Diagnosis Date  . Abdominal pain, left lower quadrant   . Acute upper respiratory infections of unspecified site   . Allergic genetic state   . Arthritis    in arms  . Glaucoma   . Lipoma of unspecified site   . Neuropathy   . Obesity   . Other and unspecified noninfectious gastroenteritis and colitis(558.9)   . Other screening mammogram   . Routine general medical examination at a health care facility   . Screening for lipoid disorders   . Special screening for osteoporosis   . Tibialis  tendinitis   . Tubular adenoma of colon 03/31/2014  . Type II or unspecified type diabetes mellitus without mention of complication, not stated as uncontrolled   . Undiagnosed cardiac murmurs   . Unspecified essential hypertension     Past Surgical History:  Procedure Laterality Date  . CATARACT EXTRACTION W/PHACO Left 09/29/2019   Procedure: CATARACT EXTRACTION PHACO AND INTRAOCULAR LENS PLACEMENT (IOC) LEFT DIABETIC 3.42 00:32.9;  Surgeon: Eulogio Bear, MD;  Location: [facility];  Service: Ophthalmology;  Laterality: Left;  . COLONOSCOPY  04/01/2004  . COLONOSCOPY  03/31/2014  . COLONOSCOPY WITH PROPOFOL N/A 01/01/2018   Procedure: COLONOSCOPY WITH PROPOFOL;  Surgeon: Lollie Sails, MD;  Location: [facility] ENDOSCOPY;  Service: Endoscopy;  Laterality: N/A;  . LIPOMA EXCISION Left 2010   arm  . TOE SURGERY  1990's   bunion, hammer toe repair  . TOTAL ABDOMINAL HYSTERECTOMY  1983   one ovary removed, later other ovary removed    Current Medications: Current Meds  Medication Sig  . acetaminophen (TYLENOL) 500 MG tablet Take 500 mg by mouth every 6 (six) hours as needed.  . Cholecalciferol (VITAMIN D3) 1.25 MG (50000 UT) CAPS 50, 000 units weekly  . Continuous Blood Gluc Receiver (FREESTYLE LIBRE 14 DAY READER) DEVI 1 EACH BY DOES NOT APPLY ROUTE 3 (THREE) TIMES DAILY AS NEEDED.  Marland Kitchen Continuous Blood Gluc Sensor (FREESTYLE LIBRE 14 DAY SENSOR) MISC 1 UNITS BY DOES NOT APPLY  ROUTE EVERY 3 (THREE) DAYS AS NEEDED.  . Dulaglutide (TRULICITY) 1.61 WR/6.0AV SOPN Inject 0.75 mg into the skin once a week.  . gabapentin (NEURONTIN) 100 MG capsule Take 100 mg by mouth at bedtime. Start 100 mg at bedtime but can increase to 200 mg or 300 mg if tolerated   . glucose monitoring kit (FREESTYLE) monitoring kit 1 each by Does not apply route as needed for other.  . Insulin Pen Needle (PEN NEEDLES) 31G X 8 MM MISC Pen needles for Trulicity, W09.811  . loperamide (IMODIUM) 2 MG capsule Take  by mouth as needed for diarrhea or loose stools.  Marland Kitchen losartan (COZAAR) 50 MG tablet Take 1 tablet (50 mg total) by mouth in the morning and at bedtime.  . [DISCONTINUED] amLODipine (NORVASC) 10 MG tablet TAKE 1 TABLET BY MOUTH EVERY DAY  . [DISCONTINUED] losartan (COZAAR) 50 MG tablet TAKE 1 TABLET BY MOUTH EVERY DAY     Allergies:   Patient has no known allergies.   Social History   Socioeconomic History  . Marital status: Single    Spouse name: Not on file  . Number of children: 0  . Years of education: Not on file  . Highest education level: Not on file  Occupational History  . Occupation: Control and instrumentation engineer, Museum/gallery curator at [employer]  . Smoking status: Never Smoker  . Smokeless tobacco: Never Used  Vaping Use  . Vaping Use: Never used  Substance and Sexual Activity  . Alcohol use: No    Alcohol/week: 0.0 standard drinks  . Drug use: No  . Sexual activity: Not Currently  Other Topics Concern  . Not on file  Social History Narrative   Caffeine use: soda/Pepsi   Fast food.  Regular exercise at work.   No sex.   Social Determinants of Health   Financial Resource Strain:   . Difficulty of Paying Living Expenses: Not on file  Food Insecurity:   . Worried About Charity fundraiser in the Last Year: Not on file  . Ran Out of Food in the Last Year: Not on file  Transportation Needs:   . Lack of Transportation (Medical): Not on file  . Lack of Transportation (Non-Medical): Not on file  Physical Activity:   . Days of Exercise per Week: Not on file  . Minutes of Exercise per Session: Not on file  Stress:   . Feeling of Stress : Not on file  Social Connections:   . Frequency of Communication with Friends and Family: Not on file  . Frequency of Social Gatherings with Friends and Family: Not on file  . Attends Religious Services: Not on file  . Active Member of Clubs or Organizations: Not on file  . Attends Archivist Meetings: Not on file  . Marital Status:  Not on file     Family History: The patient's family history includes Arthritis in her father; Colon polyps in her paternal grandfather; Diabetes in her brother and mother; Ulcerative colitis in her paternal grandmother. There is no history of Breast cancer.  ROS:   Please see the history of present illness.     All other systems reviewed and are negative.  EKGs/Labs/Other Studies Reviewed:    The following studies were reviewed today:   EKG:  EKG is ordered today.  EKG shows normal sinus rhythm.  Recent Labs: 09/19/2019: ALT 12; BUN 25; Creatinine, Ser 0.87; Potassium 4.1; Sodium 142 09/23/2019: Hemoglobin 12.8; Platelets 267.0; TSH 1.07  Recent Lipid  Panel    Component Value Date/Time   CHOL 159 09/19/2019 0910   TRIG 58.0 09/19/2019 0910   HDL 50.50 09/19/2019 0910   CHOLHDL 3 09/19/2019 0910   VLDL 11.6 09/19/2019 0910   LDLCALC 97 09/19/2019 0910    Physical Exam:    VS:  BP (!) 170/90 (BP Location: Left Arm, Patient Position: Sitting, Cuff Size: Normal)   Pulse 83   Ht 5' 1"  (1.549 m)   Wt 174 lb 4 oz (79 kg)   SpO2 98%   BMI 32.92 kg/m     Wt Readings from Last 3 Encounters:  12/19/19 174 lb 4 oz (79 kg)  11/21/19 174 lb (78.9 kg)  10/16/19 173 lb 9.6 oz (78.7 kg)     GEN:  Well nourished, well developed in no acute distress HEENT: Normal NECK: No JVD; No carotid bruits LYMPHATICS: No lymphadenopathy CARDIAC: RRR, no murmurs, rubs, gallops RESPIRATORY:  Clear to auscultation without rales, wheezing or rhonchi  ABDOMEN: Soft, non-tender, non-distended MUSCULOSKELETAL:  No edema; No deformity  SKIN: Warm and dry NEUROLOGIC:  Alert and oriented x 3 PSYCHIATRIC:  Normal affect   ASSESSMENT:    1. Shortness of breath   2. Essential hypertension   3. Blurred vision    PLAN:    In order of problems listed above:  1. Patient with shortness of breath sometimes associated with exertion.  Patient denies chest pain. Echocardiogram shows normal systolic  function, impaired relaxation.  Shortness of breath likely from deconditioning.  Slightly improved with graduated exercising.  Patient encouraged to continue exercising/walking.  2. History of hypertension, BP elevated.  Increase losartan to 50 mg twice daily.  Stop amlodipine.  3. Blurred vision, orthostatic vitals consistent with orthostasis.  Hold amlodipine as above, increase losartan and dose twice daily.  She may benefit from higher blood pressures.  However, systolic of 754 is very elevated.  Also her body may need to adjust to normal blood pressures.  Follow-up in 1 month    Medication Adjustments/Labs and Tests Ordered: Current medicines are reviewed at length with the patient today.  Concerns regarding medicines are outlined above.  Orders Placed This Encounter  Procedures  . EKG 12-Lead   Meds ordered this encounter  Medications  . losartan (COZAAR) 50 MG tablet    Sig: Take 1 tablet (50 mg total) by mouth in the morning and at bedtime.    Dispense:  60 tablet    Refill:  6    Dose increase/ stopping amlodipine    Patient Instructions  Medication Instructions:  - Your physician has recommended you make the following change in your medication:   1) STOP amlodipine  2) INCREASE losartan 50 mg- take 1 tablet by mouth TWICE daily  *If you need a refill on your cardiac medications before your next appointment, please call your pharmacy*   Lab Work: - none ordered  If you have labs (blood work) drawn today and your tests are completely normal, you will receive your results only by: Marland Kitchen MyChart Message (if you have MyChart) OR . A paper copy in the mail If you have any lab test that is abnormal or we need to change your treatment, we will call you to review the results.   Testing/Procedures: - none ordered   Follow-Up: At [facility], you and your health needs are our priority.  As part of our continuing mission to provide you with exceptional heart care, we have  created designated Provider Care Teams.  These Care Teams include your primary Cardiologist (physician) and Advanced Practice Providers (APPs -  Physician Assistants and Nurse Practitioners) who all work together to provide you with the care you need, when you need it.  We recommend signing up for the patient portal called "MyChart".  Sign up information is provided on this After Visit Summary.  MyChart is used to connect with patients for Virtual Visits (Telemedicine).  Patients are able to view lab/test results, encounter notes, upcoming appointments, etc.  Non-urgent messages can be sent to your provider as well.   To learn more about what you can do with MyChart, go to [email or web address].    Your next appointment:   1 month(s)  The format for your next appointment:   In Person  Provider:   You may see Patricia Sable, MD or one of the following Advanced Practice Providers on your designated Care Team:    Murray Hodgkins, NP  Christell Faith, PA-C  Marrianne Mood, PA-C  Cadence [place], [place]  Laurann [place], NP    Other Instructions n/a     Signed, Patricia Sable, MD  12/19/2019 12:53 PM    [place]

## 2019-12-19 NOTE — Patient Instructions (Signed)
Medication Instructions:  - Your physician has recommended you make the following change in your medication:   1) STOP amlodipine  2) INCREASE losartan 50 mg- take 1 tablet by mouth TWICE daily  *If you need a refill on your cardiac medications before your next appointment, please call your pharmacy*   Lab Work: - none ordered  If you have labs (blood work) drawn today and your tests are completely normal, you will receive your results only by: Marland Kitchen MyChart Message (if you have MyChart) OR . A paper copy in the mail If you have any lab test that is abnormal or we need to change your treatment, we will call you to review the results.   Testing/Procedures: - none ordered   Follow-Up: At [facility], you and your health needs are our priority.  As part of our continuing mission to provide you with exceptional heart care, we have created designated Provider Care Teams.  These Care Teams include your primary Cardiologist (physician) and Advanced Practice Providers (APPs -  Physician Assistants and Nurse Practitioners) who all work together to provide you with the care you need, when you need it.  We recommend signing up for the patient portal called "MyChart".  Sign up information is provided on this After Visit Summary.  MyChart is used to connect with patients for Virtual Visits (Telemedicine).  Patients are able to view lab/test results, encounter notes, upcoming appointments, etc.  Non-urgent messages can be sent to your provider as well.   To learn more about what you can do with MyChart, go to [email or web address].    Your next appointment:   1 month(s)  The format for your next appointment:   In Person  Provider:   You may see Kate Sable, MD or one of the following Advanced Practice Providers on your designated Care Team:    Murray Hodgkins, NP  Christell Faith, PA-C  Marrianne Mood, PA-C  Cadence Harwick, [place]  Laurann Montana, NP    Other  Instructions n/a

## 2019-12-24 DIAGNOSIS — E113313 Type 2 diabetes mellitus with moderate nonproliferative diabetic retinopathy with macular edema, bilateral: Secondary | ICD-10-CM | POA: Diagnosis not present

## 2020-01-19 ENCOUNTER — Ambulatory Visit: Payer: PPO | Admitting: Cardiology

## 2020-01-27 ENCOUNTER — Encounter: Payer: Self-pay | Admitting: Cardiology

## 2020-01-27 ENCOUNTER — Ambulatory Visit (INDEPENDENT_AMBULATORY_CARE_PROVIDER_SITE_OTHER): Payer: PPO | Admitting: Cardiology

## 2020-01-27 ENCOUNTER — Other Ambulatory Visit: Payer: Self-pay

## 2020-01-27 VITALS — BP 144/104 | HR 90 | Ht 61.0 in | Wt 175.0 lb

## 2020-01-27 DIAGNOSIS — R0602 Shortness of breath: Secondary | ICD-10-CM | POA: Diagnosis not present

## 2020-01-27 DIAGNOSIS — I10 Essential (primary) hypertension: Secondary | ICD-10-CM | POA: Diagnosis not present

## 2020-01-27 MED ORDER — LOSARTAN POTASSIUM 25 MG PO TABS
25.0000 mg | ORAL_TABLET | Freq: Two times a day (BID) | ORAL | 5 refills | Status: DC
Start: 2020-01-27 — End: 2021-01-14

## 2020-01-27 NOTE — Patient Instructions (Signed)
Medication Instructions:   Your physician has recommended you make the following change in your medication:   1.  DECREASE your Losartan to 25 MG: Take 1 tablet two times daily.  *If you need a refill on your cardiac medications before your next appointment, please call your pharmacy*   Lab Work: None Ordered If you have labs (blood work) drawn today and your tests are completely normal, you will receive your results only by: Marland Kitchen MyChart Message (if you have MyChart) OR . A paper copy in the mail If you have any lab test that is abnormal or we need to change your treatment, we will call you to review the results.   Testing/Procedures: None Ordered   Follow-Up: At [facility], you and your health needs are our priority.  As part of our continuing mission to provide you with exceptional heart care, we have created designated Provider Care Teams.  These Care Teams include your primary Cardiologist (physician) and Advanced Practice Providers (APPs -  Physician Assistants and Nurse Practitioners) who all work together to provide you with the care you need, when you need it.  We recommend signing up for the patient portal called "MyChart".  Sign up information is provided on this After Visit Summary.  MyChart is used to connect with patients for Virtual Visits (Telemedicine).  Patients are able to view lab/test results, encounter notes, upcoming appointments, etc.  Non-urgent messages can be sent to your provider as well.   To learn more about what you can do with MyChart, go to [email or web address].    Your next appointment:   6-8 week(s)  The format for your next appointment:   In Person  Provider:   Debbe Odea, MD   Other Instructions

## 2020-01-27 NOTE — Progress Notes (Signed)
Cardiology Office Note:    Date:  01/27/2020   ID:  Patricia Horne, DOB [birth year], MRN [id number]  PCP:  Jinny Sanders, MD  [facility] Cardiologist:  Kate Sable, MD  [place] Electrophysiologist:  None   Referring MD: Jinny Sanders, MD   Chief Complaint  Patient presents with  . Follow-up    1 Month follow up and c/o blood pressure medication is making her drowsy. Medications verbally reviewed with patient.     History of Present Illness:    Patricia Horne is a 69 y.o. female with a hx of hypertension, diabetes who presents for follow-up. Last seen due to shortness of breath and elevated blood pressures. Losartan was increased to 50 mg twice daily. Shortness of breath team secondary to deconditioning, graduated exercising advised.  She states getting dizzy with increased doses of losartan.  She stopped taking losartan 3 days ago with improvement/resolution of her symptoms.  Has no other concerns at this time.   Prior notes She had an ETT on 07/2014 which was normal. Echo 32/9924, normal systolic function, EF 26-83, impaired relaxation.  Past Medical History:  Diagnosis Date  . Abdominal pain, left lower quadrant   . Acute upper respiratory infections of unspecified site   . Allergic genetic state   . Arthritis    in arms  . Glaucoma   . Lipoma of unspecified site   . Neuropathy   . Obesity   . Other and unspecified noninfectious gastroenteritis and colitis(558.9)   . Other screening mammogram   . Routine general medical examination at a health care facility   . Screening for lipoid disorders   . Special screening for osteoporosis   . Tibialis tendinitis   . Tubular adenoma of colon 03/31/2014  . Type II or unspecified type diabetes mellitus without mention of complication, not stated as uncontrolled   . Undiagnosed cardiac murmurs   . Unspecified essential hypertension     Past Surgical History:  Procedure Laterality Date  . CATARACT  EXTRACTION W/PHACO Left 09/29/2019   Procedure: CATARACT EXTRACTION PHACO AND INTRAOCULAR LENS PLACEMENT (IOC) LEFT DIABETIC 3.42 00:32.9;  Surgeon: Eulogio Bear, MD;  Location: [facility];  Service: Ophthalmology;  Laterality: Left;  . COLONOSCOPY  04/01/2004  . COLONOSCOPY  03/31/2014  . COLONOSCOPY WITH PROPOFOL N/A 01/01/2018   Procedure: COLONOSCOPY WITH PROPOFOL;  Surgeon: Lollie Sails, MD;  Location: [facility] ENDOSCOPY;  Service: Endoscopy;  Laterality: N/A;  . LIPOMA EXCISION Left 2010   arm  . TOE SURGERY  1990's   bunion, hammer toe repair  . TOTAL ABDOMINAL HYSTERECTOMY  1983   one ovary removed, later other ovary removed    Current Medications: Current Meds  Medication Sig  . acetaminophen (TYLENOL) 500 MG tablet Take 500 mg by mouth every 6 (six) hours as needed.  . Cholecalciferol (VITAMIN D3) 1.25 MG (50000 UT) CAPS 50, 000 units weekly  . Continuous Blood Gluc Receiver (FREESTYLE LIBRE 14 DAY READER) DEVI 1 EACH BY DOES NOT APPLY ROUTE 3 (THREE) TIMES DAILY AS NEEDED.  Marland Kitchen Continuous Blood Gluc Sensor (FREESTYLE LIBRE 14 DAY SENSOR) MISC 1 UNITS BY DOES NOT APPLY ROUTE EVERY 3 (THREE) DAYS AS NEEDED.  . Dulaglutide (TRULICITY) 4.19 QQ/2.2LN SOPN Inject 0.75 mg into the skin once a week.  . gabapentin (NEURONTIN) 100 MG capsule Take 100 mg by mouth at bedtime. Start 100 mg at bedtime but can increase to 200 mg or 300 mg if tolerated   .  glucose monitoring kit (FREESTYLE) monitoring kit 1 each by Does not apply route as needed for other.  . Insulin Pen Needle (PEN NEEDLES) 31G X 8 MM MISC Pen needles for Trulicity, W97.948  . loperamide (IMODIUM) 2 MG capsule Take by mouth as needed for diarrhea or loose stools.     Allergies:   Patient has no known allergies.   Social History   Socioeconomic History  . Marital status: Single    Spouse name: Not on file  . Number of children: 0  . Years of education: Not on file  . Highest education level: Not on file   Occupational History  . Occupation: Control and instrumentation engineer, Museum/gallery curator at [employer]  . Smoking status: Never Smoker  . Smokeless tobacco: Never Used  Vaping Use  . Vaping Use: Never used  Substance and Sexual Activity  . Alcohol use: No    Alcohol/week: 0.0 standard drinks  . Drug use: No  . Sexual activity: Not Currently  Other Topics Concern  . Not on file  Social History Narrative   Caffeine use: soda/Pepsi   Fast food.  Regular exercise at work.   No sex.   Social Determinants of Health   Financial Resource Strain: Not on file  Food Insecurity: Not on file  Transportation Needs: Not on file  Physical Activity: Not on file  Stress: Not on file  Social Connections: Not on file     Family History: The patient's family history includes Arthritis in her father; Colon polyps in her paternal grandfather; Diabetes in her brother and mother; Ulcerative colitis in her paternal grandmother. There is no history of Breast cancer.  ROS:   Please see the history of present illness.     All other systems reviewed and are negative.  EKGs/Labs/Other Studies Reviewed:    The following studies were reviewed today:   EKG:  EKG not ordered today.   Recent Labs: 09/19/2019: ALT 12; BUN 25; Creatinine, Ser 0.87; Potassium 4.1; Sodium 142 09/23/2019: Hemoglobin 12.8; Platelets 267.0; TSH 1.07  Recent Lipid Panel    Component Value Date/Time   CHOL 159 09/19/2019 0910   TRIG 58.0 09/19/2019 0910   HDL 50.50 09/19/2019 0910   CHOLHDL 3 09/19/2019 0910   VLDL 11.6 09/19/2019 0910   LDLCALC 97 09/19/2019 0910    Physical Exam:    VS:  BP (!) 144/104 (BP Location: Left Arm, Patient Position: Sitting, Cuff Size: Normal)   Pulse 90   Ht 5' 1"  (1.549 m)   Wt 175 lb (79.4 kg)   SpO2 97%   BMI 33.07 kg/m     Wt Readings from Last 3 Encounters:  01/27/20 175 lb (79.4 kg)  12/19/19 174 lb 4 oz (79 kg)  11/21/19 174 lb (78.9 kg)     GEN:  Well nourished, well developed in no  acute distress HEENT: Normal NECK: No JVD; No carotid bruits LYMPHATICS: No lymphadenopathy CARDIAC: RRR, no murmurs, rubs, gallops RESPIRATORY:  Clear to auscultation without rales, wheezing or rhonchi  ABDOMEN: Soft, non-tender, non-distended  MUSCULOSKELETAL:  No edema; No deformity  SKIN: Warm and dry NEUROLOGIC:  Alert and oriented x 3 PSYCHIATRIC:  Normal affect   ASSESSMENT:    1. Primary hypertension   2. Shortness of breath    PLAN:    In order of problems listed above:  1. Nonspecific shortness of breath likely from deconditioning.  Do not think this is consistent with angina.  Echocardiogram with preserved EF, impaired relaxation.  Continue graduated exercising.  2. Hypertension, BP elevated.  Dizziness-drowsiness from higher doses of losartan.  Decrease losartan to 25 mg twice daily.  Keep a BP log.  Follow-up in 6 to 8 weeks.   Medication Adjustments/Labs and Tests Ordered: Current medicines are reviewed at length with the patient today.  Concerns regarding medicines are outlined above.  No orders of the defined types were placed in this encounter.  Meds ordered this encounter  Medications  . losartan (COZAAR) 25 MG tablet    Sig: Take 1 tablet (25 mg total) by mouth in the morning and at bedtime.    Dispense:  60 tablet    Refill:  5    Dose increase/ stopping amlodipine    Patient Instructions  Medication Instructions:   Your physician has recommended you make the following change in your medication:   1.  DECREASE your Losartan to 25 MG: Take 1 tablet two times daily.  *If you need a refill on your cardiac medications before your next appointment, please call your pharmacy*   Lab Work: None Ordered If you have labs (blood work) drawn today and your tests are completely normal, you will receive your results only by: Marland Kitchen MyChart Message (if you have MyChart) OR . A paper copy in the mail If you have any lab test that is abnormal or we need to change  your treatment, we will call you to review the results.   Testing/Procedures: None Ordered   Follow-Up: At [facility], you and your health needs are our priority.  As part of our continuing mission to provide you with exceptional heart care, we have created designated Provider Care Teams.  These Care Teams include your primary Cardiologist (physician) and Advanced Practice Providers (APPs -  Physician Assistants and Nurse Practitioners) who all work together to provide you with the care you need, when you need it.  We recommend signing up for the patient portal called "MyChart".  Sign up information is provided on this After Visit Summary.  MyChart is used to connect with patients for Virtual Visits (Telemedicine).  Patients are able to view lab/test results, encounter notes, upcoming appointments, etc.  Non-urgent messages can be sent to your provider as well.   To learn more about what you can do with MyChart, go to [email or web address].    Your next appointment:   6-8 week(s)  The format for your next appointment:   In Person  Provider:   Kate Sable, MD   Other Instructions      Signed, Kate Sable, MD  01/27/2020 10:47 AM    [place]

## 2020-02-05 ENCOUNTER — Telehealth: Payer: Self-pay
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
## 2020-02-10 ENCOUNTER — Ambulatory Visit: Payer: Self-pay

## 2020-02-10 ENCOUNTER — Other Ambulatory Visit: Payer: Self-pay

## 2020-02-10 DIAGNOSIS — IMO0002 Reserved for concepts with insufficient information to code with codable children: Secondary | ICD-10-CM

## 2020-02-10 DIAGNOSIS — I152 Hypertension secondary to endocrine disorders: Secondary | ICD-10-CM

## 2020-02-10 DIAGNOSIS — E1349 Other specified diabetes mellitus with other diabetic neurological complication: Secondary | ICD-10-CM

## 2020-02-10 DIAGNOSIS — E1159 Type 2 diabetes mellitus with other circulatory complications: Secondary | ICD-10-CM

## 2020-02-10 NOTE — Patient Instructions (Addendum)
February 10, 2020  Dear Patricia Horne,  It was a pleasure meeting you during our initial appointment on February 10, 2020. Below is a summary of the goals we discussed and components of chronic care management. Please contact me anytime with questions or concerns.   Visit Information  Goals Addressed            This Visit's Progress   . Pharmacy Care Plan       CARE PLAN ENTRY (see longitudinal plan of care for additional care plan information)  Current Barriers:  . Chronic Disease Management support, education, and care coordination needs related to Hypertension and Diabetes   Hypertension BP Readings from Last 3 Encounters:  01/27/20 (!) 144/104  12/19/19 (!) 170/90  11/21/19 (!) 166/102   . Pharmacist Clinical Goal(s): o Over the next 30 days, patient will work with PharmD and providers to achieve BP goal <140/90 . Current regimen:  o Losartan 25 mg - 1 tablet twice daily . Interventions: o Encouraged blood pressure control with exercise and low sodium diet o Encouraged taking medication as prescribed. Call if any dizziness with losartan twice daily o Encouraged checking home BP 3 days weekly for the next 2 weeks  . Patient self care activities - Over the next 30 days, patient will: o Check BP 3 days per week, document, and provide at future appointments o Take losartan twice daily and avoid missed doses o Set alarm on phone to assist with losartan adherence o Try Ms. Dash no salt seasonings to replace salt on food  Diabetes Lab Results  Component Value Date/Time   HGBA1C 6.5 09/19/2019 09:10 AM   HGBA1C 6.7 (H) 06/09/2019 09:01 AM   HGBA1C 13.9 06/05/2017 12:00 AM   . Pharmacist Clinical Goal(s): o Over the next 30 days, patient will work with PharmD and providers to maintain A1c goal <7% . Current regimen:   Trulicity 3.47 mg - Inject once weekly on Sundays   [facility] . Interventions: o Reviewed use of Imodium and discussed safety of use as needed  up to 8 mg daily. Patient taking about 2 times weekly. o Discussed missed doses of Trulicity. Take Trulicity today and resume Sunday dosing.  o Reviewed home BG monitoring. Patient using Freestyle. It fell off early this month but usually works well. Average fasting 90-115. Average after meals 175-180. Marland Kitchen Patient self care activities - Over the next 30 days, patient will: o Continue to use Freestyle Libre to ensure BG remains within goal (fasting < 130 and 2 hours after meals < 180)  o Call if diarrhea worsens or becomes intolerable  o Contact provider with any episodes of hypoglycemia  Initial goal documentation       Patricia Horne was given information about Chronic Care Management services today including:  1. CCM service includes personalized support from designated clinical staff supervised by her physician, including individualized plan of care and coordination with other care providers 2. 24/7 contact phone numbers for assistance for urgent and routine care needs. 3. Standard insurance, coinsurance, copays and deductibles apply for chronic care management only during months in which we provide at least 20 minutes of these services. Most insurances cover these services at 100%, however patients may be responsible for any copay, coinsurance and/or deductible if applicable. This service may help you avoid the need for more expensive face-to-face services. 4. Only one practitioner may furnish and bill the service in a calendar month. 5. The patient may stop CCM services at any  time (effective at the end of the month) by phone call to the office staff.  Patient agreed to services and verbal consent obtained.   The patient verbalized understanding of instructions, educational materials, and care plan provided today and agreed to receive a mailed copy of patient instructions, educational materials, and care plan.  Telephone follow up appointment with pharmacy team member scheduled for: 05/11/2020 at  8:30 AM (phone)  [name], PharmD Clinical Pharmacist [facility] [phone]   Basics of Medicine Management Taking your medicines correctly is an important part of managing or preventing medical problems. Make sure you know what disease or condition your medicine is treating, and how and when to take it. If you do not take your medicine correctly, it may not work well and may cause unpleasant side effects, including serious health problems. What should I do when I am taking medicines?  Read all the labels and inserts that come with your medicines. Review the information often.  Talk with your pharmacist if you get a refill and notice a change in the size, color, or shape of your medicines.  Know the potential side effects for each medicine that you take.  Try to get all your medicines from the same pharmacy. The pharmacist will have all your information and will understand how your medicines will affect each other (interact).  Tell your health care provider about all your medicines, including over-the-counter medicines, vitamins, and herbal or dietary supplements. He or she will make sure that nothing will interact with any of your prescribed medicines.   How can I take my medicines safely?  Take medicines only as told by your health care provider. ? Do not take more of your medicine than instructed. ? Do not take anyone else's medicines. ? Do not share your medicines with others. ? Do not stop taking your medicines unless your health care provider tells you to do so. ? You may need to avoid alcohol or certain foods or liquids when taking certain medicines. Follow your health care provider's instructions.  Do not split, mash, or chew your medicines unless your health care provider tells you to do so. Tell your health care provider if you have trouble swallowing your medicines.  For liquid medicine, use the dosing container that was provided. How  should I organize my medicines? Know your medicines  Know what each of your medicines looks like. This includes size, color, and shape. Tell your health care provider if you are having trouble recognizing all the medicines that you are taking.  If you cannot tell your medicines apart because they look similar, keep them in original bottles.  If you cannot read the labels on the bottles, tell your pharmacist to put your medicines in containers with large print.  Review your medicines and your schedule with family members, a friend, or a caregiver. Use a pill organizer  Use a tool to organize your medicine schedule. Tools include a weekly pillbox, a written chart, a notebook, or a calendar.  Your tool should help you remember the following things about each medicine: ? The name of the medicine. ? The amount (dose) to take. ? The schedule. This is the day and time the medicine should be taken. ? The appearance. This includes color, shape, size, and stamp. ? How to take your medicines. This includes instructions to take them with food, without food, with fluids, or with other medicines.  Create reminders for taking your medicines. Use sticky notes,  or alarms on your watch, mobile device, or phone calendar.  You may choose to use a more advanced management system. These systems have storage, alarms, and visual and audio prompts.  Some medicines can be taken on an "as-needed" basis. These include medicines for nausea or pain. If you take an as-needed medicine, write down the name and dose, as well as the date and time that you took it.   How should I plan for travel?  Take your pillbox, medicines, and organization system with you when traveling.  Have your medicines refilled before you travel. This will ensure that you do not run out of your medicines while you are away from home.  Always carry an updated list of your medicines with you. If there is an emergency, a first responder can  quickly see what medicines you are taking.  Do not pack your medicines in checked luggage in case your luggage is lost or delayed.  If any of your medicines is considered a controlled substance, make sure you bring a letter from your health care provider with you. How should I store and discard my medicines? For safe storage:  Store medicines in a cool, dry area away from light, or as directed by your health care provider. Do not store medicines in the bathroom. Heat and humidity will affect them.  Do not store your medicines with other chemicals, or with medicines for pets or other household members.  Keep medicines away from children and pets. Do not leave them on counters or bedside tables. Store them in high cabinets or on high shelves. For safe disposal:  Check expiration dates regularly. Do not take expired medicines. Discard medicines that are older than the expiration date.  Learn a safe way to dispose of your medicines. You may: ? Use a local government, hospital, or pharmacy medicine-take-back program. ? Mix the medicines with inedible substances, put them in a sealed bag or empty container, and throw them in the trash. What should I remember?  Tell your health care provider if you: ? Experience side effects. ? Have new symptoms. ? Have other concerns about taking your medicines.  Review your medicines regularly with your health care provider. Other medicines, diet, medical conditions, weight changes, and daily habits can all affect how medicines work. Ask if you need to continue taking each medicine, and discuss how well each one is working.  Refill your medicines early to avoid running out of them.  In case of an accidental overdose, call your local [place] at [phone] or visit your local emergency department immediately. This is important. Summary  Taking your medicines correctly is an important part of managing or preventing medical problems.  You  need to make sure that you understand what you are taking a medicine for, as well as how and when you need to take it.  Know your medicines and use a pill organizer to help you take your medicines correctly.  In case of an accidental overdose, call your local [place] at [phone] or visit your local emergency department immediately. This is important. This information is not intended to replace advice given to you by your health care provider. Make sure you discuss any questions you have with your health care provider. Document Revised: 01/11/2017 Document Reviewed: 01/11/2017 Elsevier Patient Education  2021 [employer].

## 2020-02-10 NOTE — Chronic Care Management (AMB) (Signed)
Chronic Care Management Pharmacy  Name: Patricia Horne  MRN: [id number] DOB: 16-Nov-1950  Initial Questions reviewed - Patient concerned Trulicity may be causing loose stools.   Chief Complaint/ HPI  Patricia Horne,  70 y.o. , female presents for their Initial CCM visit with the clinical pharmacist via telephone.  PCP : Jinny Sanders, MD  Their chronic conditions include: HTN, diabetes, neuropathy, constipation, allergic rhinitis, statin intolerance   Office Visits:  09/23/19: Diona Browner - Fatigue concerning for cardiac cause, refer back to cardiology for testing, HTN controlled when compliant. DM good control on Trulicity. Neuropathy stable on current regimen.  Consult Visit: 01/27/20: Cardiology - decrease losartan from 50 mg BID to 25 mg BID due to dizziness. Monitor BP. 12/24/19: Ophthalmology injections given  12/19/19: Cardiology - SOB, dizziness BP elevated. Orthostasis. Increase losartan to 50 mg and change from daily to twice daily. Stop amlodipine.   No Known Allergies  Medications: Outpatient Encounter Medications as of 02/10/2020  Medication Sig  . acetaminophen (TYLENOL) 500 MG tablet Take 500 mg by mouth every 6 (six) hours as needed.  . Cholecalciferol (VITAMIN D3) 1.25 MG (50000 UT) CAPS 50, 000 units weekly  . Continuous Blood Gluc Receiver (FREESTYLE LIBRE 14 DAY READER) DEVI 1 EACH BY DOES NOT APPLY ROUTE 3 (THREE) TIMES DAILY AS NEEDED.  Marland Kitchen Continuous Blood Gluc Sensor (FREESTYLE LIBRE 14 DAY SENSOR) MISC 1 UNITS BY DOES NOT APPLY ROUTE EVERY 3 (THREE) DAYS AS NEEDED.  . Dulaglutide (TRULICITY) 0.34 VQ/2.5ZD SOPN Inject 0.75 mg into the skin once a week.  . gabapentin (NEURONTIN) 100 MG capsule Take 100 mg by mouth at bedtime. Start 100 mg at bedtime but can increase to 200 mg or 300 mg if tolerated   . glucose monitoring kit (FREESTYLE) monitoring kit 1 each by Does not apply route as needed for other.  . Insulin Pen Needle (PEN NEEDLES) 31G X 8 MM MISC Pen  needles for Trulicity, G38.756  . loperamide (IMODIUM) 2 MG capsule Take by mouth as needed for diarrhea or loose stools.  Marland Kitchen losartan (COZAAR) 25 MG tablet Take 1 tablet (25 mg total) by mouth in the morning and at bedtime.   No facility-administered encounter medications on file as of 02/10/2020.   Current Diagnosis/Assessment:  SDOH Interventions   Flowsheet Row Most Recent Value  SDOH Interventions   Financial Strain Interventions Intervention Not Indicated     Goals Addressed            This Visit's Progress   . Pharmacy Care Plan       CARE PLAN ENTRY (see longitudinal plan of care for additional care plan information)  Current Barriers:  . Chronic Disease Management support, education, and care coordination needs related to Hypertension and Diabetes   Hypertension BP Readings from Last 3 Encounters:  01/27/20 (!) 144/104  12/19/19 (!) 170/90  11/21/19 (!) 166/102   . Pharmacist Clinical Goal(s): o Over the next 30 days, patient will work with PharmD and providers to achieve BP goal <140/90 . Current regimen:  o Losartan 25 mg - 1 tablet twice daily . Interventions: o Encouraged blood pressure control with exercise and low sodium diet o Encouraged taking medication as prescribed. Call if any dizziness with losartan twice daily o Encouraged checking home BP 3 days weekly for the next 2 weeks  . Patient self care activities - Over the next 30 days, patient will: o Check BP 3 days per week, document, and provide at future appointments  o Take losartan twice daily and avoid missed doses o Set alarm on phone to assist with losartan adherence o Try Ms. Dash no salt seasonings to replace salt on food  Diabetes Lab Results  Component Value Date/Time   HGBA1C 6.5 09/19/2019 09:10 AM   HGBA1C 6.7 (H) 06/09/2019 09:01 AM   HGBA1C 13.9 06/05/2017 12:00 AM   . Pharmacist Clinical Goal(s): o Over the next 30 days, patient will work with PharmD and providers to maintain A1c  goal <7% . Current regimen:   Trulicity 9.51 mg - Inject once weekly on Sundays   [facility] . Interventions: o Reviewed use of Imodium and discussed safety of use as needed up to 8 mg daily. Patient taking about 2 times weekly. o Discussed missed doses of Trulicity. Take Trulicity today and resume Sunday dosing.  o Reviewed home BG monitoring. Patient using Freestyle. It fell off early this month but usually works well. Average fasting 90-115. Average after meals 175-180. Marland Kitchen Patient self care activities - Over the next 30 days, patient will: o Continue to use Freestyle Libre to ensure BG remains within goal (fasting < 130 and 2 hours after meals < 180)  o Call if diarrhea worsens or becomes intolerable  o Contact provider with any episodes of hypoglycemia  Initial goal documentation      Hypertension   CMP Latest Ref Rng & Units 09/19/2019 06/09/2019 03/03/2019  Glucose 70 - 99 mg/dL 116(H) 141(H) 110(H)  BUN 6 - 23 mg/dL 25(H) 27(H) 20  Creatinine 0.40 - 1.20 mg/dL 0.87 0.82 0.77  Sodium 135 - 145 mEq/L 142 142 139  Potassium 3.5 - 5.1 mEq/L 4.1 3.9 4.2  Chloride 96 - 112 mEq/L 107 107 104  CO2 19 - 32 mEq/L 29 30 28   Calcium 8.4 - 10.5 mg/dL 9.2 9.0 9.1  Total Protein 6.0 - 8.3 g/dL 7.2 6.9 6.8  Total Bilirubin 0.2 - 1.2 mg/dL 0.6 0.7 0.7  Alkaline Phos 39 - 117 U/L 80 71 66  AST 0 - 37 U/L 14 14 13   ALT 0 - 35 U/L 12 11 9    Office blood pressures are: BP Readings from Last 3 Encounters:  01/27/20 (!) 144/104  12/19/19 (!) 170/90  11/21/19 (!) 166/102   BP goal < 140/90 mmHg Patient has failed these meds in the past: amlodipine - stopped by cardio due to dizziness  Patient checks BP at home several times per month  Pt reports sometimes her home monitor doesn't work  Patient home BP readings are ranging:145-150/90s  Patient is currently uncontrolled on the following medications:   Losartan 25 mg - 1 tablet BID   We discussed:  Adherence --> Doesn't like taking  medications. Reports this is due to difficulty remembering to take. Works third shift. Pt reports missing quite a few doses of losartan. Recent history of dizziness pt attributes to losartan. Dose was split to BID to help, but pt has not noticed an improvement. Amlodipine also stopped due to dizziness and pt denies improvement. Pt wants to try to get back on track with taking losartan BID to see if BP improves by next PCP visit. She reports her BP monitor doesn't always work. Plans to check her BP at work and compare it to her home monitor to check accuracy.   Diet/Exercise --> We discussed importance of diet/exercise in BP control. Recommed Ms. Dash seasonings. Pt is using table salt with most meals.  Pt denies any exercise or dietary restrictions/goals. Pt has silver  sneakers but hasnt been going to gym. Walking to the mailbox about 1-2 times per week (30 feet).   Plan: Continue current medications. Resume losartan twice daily as prescribed. Set alarm on phone to assist with losartan adherence. Note any dizziness. Try Ms. Dash no salt seasonings to replace salt on food. Monitor home BP for the next 3 weeks. CMA will call to review  Diabetes   Recent Relevant Labs: Lab Results  Component Value Date/Time   HGBA1C 6.5 09/19/2019 09:10 AM   HGBA1C 6.7 (H) 06/09/2019 09:01 AM   HGBA1C 13.9 06/05/2017 12:00 AM   MICROALBUR 1.9 04/13/2016 09:47 AM   MICROALBUR 1.0 12/07/2014 10:49 AM    A1c goal < 7% Checking BG: Wearing Freestyle Libre Reports an occasional low ~60 (< 1 /month) treats with orange juice Highest 250 in past month Average 175-180 after meals  Average 90-115 before breakfast  Patient has failed these meds in past: Metformin, Jardiance  Patient is currently controlled on the following medications:   Trulicity 1.50 mg - Inject once weekly on Sundays   Freestyle Libre   Lab Results  Component Value Date/Time   HMDIABEYEEXA Retinopathy (A) 03/06/2019 12:00 AM    Lab Results   Component Value Date/Time   HMDIABFOOTEX done 09/23/2019 12:00 AM    We discussed: Some diarrhea with Trulicity, but tolerable. Has been on it for 2 years. Was not able to tolerate Trulicity 1.5 mg. Takes Imodium PRN. She is concerned about safety of Imodium chronic use. Reports taking about 2 tabs every 3-4 days. Discussed this is okay to continue at current dose. Let us know if frequency increases. She missed Trulicity this past Sunday. Encouraged her to take it today and then resume Sunday dosing.  Plan: Continue current medications  Hyperlipidemia   LDL goal < 100  Last lipids Lab Results  Component Value Date   CHOL 159 09/19/2019   HDL 50.50 09/19/2019   LDLCALC 97 09/19/2019   TRIG 58.0 09/19/2019   CHOLHDL 3 09/19/2019   Hepatic Function Latest Ref Rng & Units 09/19/2019 06/09/2019 03/03/2019  Total Protein 6.0 - 8.3 g/dL 7.2 6.9 6.8  Albumin 3.5 - 5.2 g/dL 3.8 3.6 3.7  AST 0 - 37 U/L 14 14 13   ALT 0 - 35 U/L 12 11 9   Alk Phosphatase 39 - 117 U/L 80 71 66  Total Bilirubin 0.2 - 1.2 mg/dL 0.6 0.7 0.7  Bilirubin, Direct 0.0 - 0.3 mg/dL - - -    The 10-year ASCVD risk score Mikey Bussing DC Jr., et al., 2013) is: 25.6%   Values used to calculate the score:     Age: 24 years     Sex: Female     Is Non-Hispanic African American: Yes     Diabetic: Yes     Tobacco smoker: No     Systolic Blood Pressure: 569 mmHg     Is BP treated: Yes     HDL Cholesterol: 50.5 mg/dL     Total Cholesterol: 159 mg/dL   Patient has failed these meds in past: statins Patient is currently controlled on the following medications:  . No pharmacotherapy  We discussed: LDL within goal, however per ASCVD risk and diabetes, statin recommended. Pt has refused in the past. Patient declines any new medications.  Plan: Continue control with diet and exercise   Neuropathy    Patient has failed these meds in past: none  Patient is currently controlled on the following medications:   Gabapentin 100 mg -  1-3 tablets at bedtime  Plan: Continue current medications  Vitamin D Deficiency    Last DEXA Scan: 2018 - normal   VITD  Date Value Ref Range Status  09/23/2019 9.87 (L) 30.00 - 100.00 ng/mL Final    Patient has failed these meds in past: none  Patient is currently uncontrolled on the following medications:  Marland Kitchen Vitamin D3 50,000 units - 1 weekly (started 09/23/19)  We discussed: Pt to continue high dose vitamin D at this time.   Plan: Continue current medications  Misc:   Loperamide 2 mg PRN - takes 2 tablets every 3-4 days   Tylenol PRN  Medication Management   Patient's [facility]  [id number] [id number]: [phone]: [phone] Uses pill box? Yes   Follow up:  3 month phone visit; [place] and BP log in 3 weeks   MDebbora Dus PharmD Clinical Pharmacist [phone]

## 2020-02-11 DIAGNOSIS — E113313 Type 2 diabetes mellitus with moderate nonproliferative diabetic retinopathy with macular edema, bilateral: Secondary | ICD-10-CM | POA: Diagnosis not present

## 2020-02-19 DIAGNOSIS — E1159 Type 2 diabetes mellitus with other circulatory complications: Secondary | ICD-10-CM | POA: Diagnosis not present

## 2020-02-19 DIAGNOSIS — H2511 Age-related nuclear cataract, right eye: Secondary | ICD-10-CM | POA: Diagnosis not present

## 2020-02-24 ENCOUNTER — Telehealth: Payer: Self-pay | Admitting: Cardiology

## 2020-02-24 MED ORDER — AMLODIPINE BESYLATE 5 MG PO TABS: 5.0000 mg | ORAL_TABLET | Freq: Two times a day (BID) | ORAL | 5 refills | Status: DC

## 2020-02-24 NOTE — Telephone Encounter (Signed)
Called patient and she informed me that she has cancelled her eye procedure for now until her BP is under control. I informed her that Dr. Mylo Red is recommending that we restart Amlodipine 5 MG BID. Patient verbalized understanding and agreed with plan. She will monitor her BP daily and let us know in 1 week how her BP is doing.

## 2020-02-24 NOTE — Telephone Encounter (Signed)
Pt c/o BP issue: STAT if pt c/o blurred vision, one-sided weakness or slurred speech  1. What are your last 5 BP readings? 205/119  2. Are you having any other symptoms (ex. Dizziness, headache, blurred vision, passed out)? n/a  3. What is your BP issue? Very high  Patient is getting ready to have cataract surgery on 03/01/20. [facility] is now concerned about her high BP

## 2020-03-01 ENCOUNTER — Ambulatory Visit: Admission: RE | Admit: 2020-03-01 | Payer: HMO | Source: Home / Self Care | Admitting: Ophthalmology

## 2020-03-01 ENCOUNTER — Encounter: Admission: RE | Payer: Self-pay | Source: Home / Self Care

## 2020-03-01 SURGERY — PHACOEMULSIFICATION, CATARACT, WITH IOL INSERTION
Anesthesia: Topical | Laterality: Right

## 2020-03-08 ENCOUNTER — Telehealth: Payer: Self-pay
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
## 2020-03-09 ENCOUNTER — Ambulatory Visit: Payer: HMO | Admitting: Cardiology

## 2020-03-15 ENCOUNTER — Telehealth: Payer: Self-pay
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
## 2020-03-24 DIAGNOSIS — E113313 Type 2 diabetes mellitus with moderate nonproliferative diabetic retinopathy with macular edema, bilateral: Secondary | ICD-10-CM | POA: Diagnosis not present

## 2020-03-24 LAB — HM DIABETES EYE EXAM

## 2020-03-26 ENCOUNTER — Other Ambulatory Visit: Payer: Self-pay

## 2020-03-26 ENCOUNTER — Ambulatory Visit (INDEPENDENT_AMBULATORY_CARE_PROVIDER_SITE_OTHER): Payer: HMO | Admitting: Family Medicine

## 2020-03-26 ENCOUNTER — Encounter: Payer: Self-pay | Admitting: Family Medicine

## 2020-03-26 VITALS — BP 138/90 | HR 79 | Temp 97.9°F | Ht 61.0 in | Wt 176.0 lb

## 2020-03-26 DIAGNOSIS — E1140 Type 2 diabetes mellitus with diabetic neuropathy, unspecified: Secondary | ICD-10-CM | POA: Diagnosis not present

## 2020-03-26 DIAGNOSIS — I152 Hypertension secondary to endocrine disorders: Secondary | ICD-10-CM

## 2020-03-26 DIAGNOSIS — E1349 Other specified diabetes mellitus with other diabetic neurological complication: Secondary | ICD-10-CM | POA: Diagnosis not present

## 2020-03-26 DIAGNOSIS — E1365 Other specified diabetes mellitus with hyperglycemia: Secondary | ICD-10-CM

## 2020-03-26 DIAGNOSIS — M546 Pain in thoracic spine: Secondary | ICD-10-CM

## 2020-03-26 DIAGNOSIS — Z9114 Patient's other noncompliance with medication regimen: Secondary | ICD-10-CM | POA: Diagnosis not present

## 2020-03-26 DIAGNOSIS — Z23 Encounter for immunization: Secondary | ICD-10-CM

## 2020-03-26 DIAGNOSIS — K582 Mixed irritable bowel syndrome: Secondary | ICD-10-CM | POA: Diagnosis not present

## 2020-03-26 DIAGNOSIS — L820 Inflamed seborrheic keratosis: Secondary | ICD-10-CM | POA: Diagnosis not present

## 2020-03-26 DIAGNOSIS — Z91148 Patient's other noncompliance with medication regimen for other reason: Secondary | ICD-10-CM

## 2020-03-26 DIAGNOSIS — IMO0002 Reserved for concepts with insufficient information to code with codable children: Secondary | ICD-10-CM

## 2020-03-26 DIAGNOSIS — E1159 Type 2 diabetes mellitus with other circulatory complications: Secondary | ICD-10-CM | POA: Diagnosis not present

## 2020-03-26 LAB — POCT GLYCOSYLATED HEMOGLOBIN (HGB A1C): Hemoglobin A1C: 7.3 % — AB (ref 4.0–5.6)

## 2020-03-26 NOTE — Assessment & Plan Note (Signed)
This appears to likely be cause of her symptoms.. she fluctuates between diarrhea and constipation.  Recommended daily fiber, and increase water intake. She is not interested in referral or medication.

## 2020-03-26 NOTE — Assessment & Plan Note (Signed)
Symptoms returned ff gabapentin.Marland Kitchen okay to use this particular med prn.

## 2020-03-26 NOTE — Assessment & Plan Note (Signed)
Worsened control off Trulicity.. was very well controlled on this medication. Diarrhea did not go away off this med for 1 month. Doubt SE to any of her meds.   Restart Trulicity and follow up in 3 months.

## 2020-03-26 NOTE — Progress Notes (Signed)
Patient ID: Patricia Horne, female    DOB: [birth year], 70 y.o.   MRN: [id number]  This visit was conducted in person.  BP 138/90   Pulse 79   Temp 97.9 F (36.6 C) (Temporal)   Ht _0  (1.549 m)   Wt 176 lb (79.8 kg)   SpO2 95%   BMI 33.25 kg/m    CC:  Chief Complaint  Patient presents with  . Diabetes    Subjective:   HPI: Patricia Horne is a 70 y.o. female presenting on 03/26/2020 for Diabetes She is not taking any medication in last month. Did not  Diabetes with neurologic complicaitons:  Worsening of control.. she has stopped her Trulicity Lab Results  Component Value Date   HGBA1C 7.3 (A) 03/26/2020  Using medications without difficulties: Hypoglycemic episodes: Hyperglycemic episodes: Feet problems: Blood Sugars averaging: eye exam within last year:   She has irritated  skin lesion on left bra line.   No falls.. TTP on right lower back, no radiation of pain, no numbness , no weakness.  She has intermittent diarrhea,  occ constipation. Abd cramping.  Hypertension:  Borderline control this morning .Marland Kitchen off amlodipine and losartan. No clear reason why... " I just needed a break"  She has  appt next week with cardiologist... she will follow up with him.  BP Readings from Last 3 Encounters:  03/26/20 138/90  01/27/20 (!) 144/104  12/19/19 (!) 170/90  Using medication without problems or lightheadedness:  none Chest pain with exertion:none Edema:none Short of breath:none Average home BPs: Other issues:       Relevant past medical, surgical, family and social history reviewed and updated as indicated. Interim medical history since our last visit reviewed. Allergies and medications reviewed and updated. Outpatient Medications Prior to Visit  Medication Sig Dispense Refill  . acetaminophen (TYLENOL) 500 MG tablet Take 500 mg by mouth every 6 (six) hours as needed.    Marland Kitchen amLODipine (NORVASC) 5 MG tablet Take 1 tablet (5 mg total) by mouth in the  morning and at bedtime. (Patient not taking: Reported on 03/26/2020) 60 tablet 5  . chlorhexidine (PERIDEX) 0.12 % solution PLEASE SEE ATTACHED FOR DETAILED DIRECTIONS    . Cholecalciferol (VITAMIN D3) 1.25 MG (50000 UT) CAPS 50, 000 units weekly (Patient not taking: Reported on 03/26/2020) 12 capsule 3  . Continuous Blood Gluc Receiver (FREESTYLE LIBRE 14 DAY READER) DEVI 1 EACH BY DOES NOT APPLY ROUTE 3 (THREE) TIMES DAILY AS NEEDED. (Patient not taking: Reported on 03/26/2020) 1 each 0  . Continuous Blood Gluc Sensor (FREESTYLE LIBRE 14 DAY SENSOR) MISC 1 UNITS BY DOES NOT APPLY ROUTE EVERY 3 (THREE) DAYS AS NEEDED. (Patient not taking: Reported on 03/26/2020) 4 each 3  . Dulaglutide (TRULICITY) 7.98 XQ/1.1HE SOPN Inject 0.75 mg into the skin once a week. (Patient not taking: Reported on 03/26/2020) 3 mL 11  . gabapentin (NEURONTIN) 100 MG capsule Take 100 mg by mouth at bedtime. Start 100 mg at bedtime but can increase to 200 mg or 300 mg if tolerated  (Patient not taking: Reported on 03/26/2020)    . glucose monitoring kit (FREESTYLE) monitoring kit 1 each by Does not apply route as needed for other. (Patient not taking: Reported on 03/26/2020) 1 each 0  . Insulin Pen Needle (PEN NEEDLES) 31G X 8 MM MISC Pen needles for Trulicity, R74.081 (Patient not taking: Reported on 03/26/2020) 50 each 11  . loperamide (IMODIUM) 2 MG capsule Take by mouth as  needed for diarrhea or loose stools. (Patient not taking: Reported on 03/26/2020)    . losartan (COZAAR) 25 MG tablet Take 1 tablet (25 mg total) by mouth in the morning and at bedtime. (Patient not taking: Reported on 03/26/2020) 60 tablet 5   No facility-administered medications prior to visit.     Per HPI unless specifically indicated in ROS section below Review of Systems  Constitutional: Negative for fatigue and fever.  HENT: Negative for ear pain.   Eyes: Negative for pain.  Respiratory: Negative for chest tightness and shortness of breath.    Cardiovascular: Negative for chest pain, palpitations and leg swelling.  Gastrointestinal: Negative for abdominal pain.  Genitourinary: Positive for flank pain. Negative for dysuria.  Musculoskeletal: Positive for arthralgias and back pain.   Objective:  BP 138/90   Pulse 79   Temp 97.9 F (36.6 C) (Temporal)   Ht _0  (1.549 m)   Wt 176 lb (79.8 kg)   SpO2 95%   BMI 33.25 kg/m   Wt Readings from Last 3 Encounters:  03/26/20 176 lb (79.8 kg)  01/27/20 175 lb (79.4 kg)  12/19/19 174 lb 4 oz (79 kg)      Physical Exam Constitutional:      General: She is not in acute distress.Vital signs are normal.     Appearance: Normal appearance. She is well-developed and well-nourished. She is obese. She is not ill-appearing or toxic-appearing.  HENT:     Head: Normocephalic.     Right Ear: Hearing, tympanic membrane, ear canal and external ear normal. Tympanic membrane is not erythematous, retracted or bulging.     Left Ear: Hearing, tympanic membrane, ear canal and external ear normal. Tympanic membrane is not erythematous, retracted or bulging.     Nose: No mucosal edema or rhinorrhea.     Right Sinus: No maxillary sinus tenderness or frontal sinus tenderness.     Left Sinus: No maxillary sinus tenderness or frontal sinus tenderness.     Mouth/Throat:     Mouth: Oropharynx is clear and moist and mucous membranes are normal.     Pharynx: Uvula midline.  Eyes:     General: Lids are normal. Lids are everted, no foreign bodies appreciated.     Extraocular Movements: EOM normal.     Conjunctiva/sclera: Conjunctivae normal.     Pupils: Pupils are equal, round, and reactive to light.  Neck:     Thyroid: No thyroid mass or thyromegaly.     Vascular: No carotid bruit.     Trachea: Trachea normal.  Cardiovascular:     Rate and Rhythm: Normal rate and regular rhythm.     Pulses: Normal pulses and intact distal pulses.     Heart sounds: Normal heart sounds, S1 normal and S2 normal. No  murmur heard. No friction rub. No gallop.   Pulmonary:     Effort: Pulmonary effort is normal. No tachypnea or respiratory distress.     Breath sounds: Normal breath sounds. No decreased breath sounds, wheezing, rhonchi or rales.  Abdominal:     General: Bowel sounds are normal.     Palpations: Abdomen is soft.     Tenderness: There is no abdominal tenderness.  Musculoskeletal:     Cervical back: Normal range of motion and neck supple.  Skin:    General: Skin is warm, dry and intact.     Findings: No rash.  Neurological:     Mental Status: She is alert.  Psychiatric:  Mood and Affect: Mood is not anxious or depressed.        Speech: Speech normal.        Behavior: Behavior normal. Behavior is cooperative.        Thought Content: Thought content normal.        Cognition and Memory: Cognition and memory normal.        Judgment: Judgment normal.       Results for orders placed or performed in visit on 03/26/20  POCT glycosylated hemoglobin (Hb A1C)  Result Value Ref Range   Hemoglobin A1C 7.3 (A) 4.0 - 5.6 %   HbA1c POC (<> result, manual entry)     HbA1c, POC (prediabetic range)     HbA1c, POC (controlled diabetic range)      This visit occurred during the SARS-CoV-2 public health emergency.  Safety protocols were in place, including screening questions prior to the visit, additional usage of staff PPE, and extensive cleaning of exam room while observing appropriate contact time as indicated for disinfecting solutions.   COVID 19 screen:  No recent travel or known exposure to COVID19 The patient denies respiratory symptoms of COVID 19 at this time. The importance of social distancing was discussed today.   Assessment and Plan Procedure Note:  Treated one  inflammed SK on left upper breast, laterally with 3 freeze thaw cycles of cryotherapy. No complications.    Problem List Items Addressed This Visit    DM (diabetes mellitus), secondary, uncontrolled, with  neurologic complications ([place]) - Primary    Worsened control off Trulicity.. was very well controlled on this medication. Diarrhea did not go away off this med for 1 month. Doubt SE to any of her meds.   Restart Trulicity and follow up in 3 months.      Relevant Orders   POCT glycosylated hemoglobin (Hb A1C) (Completed)   Hypertension associated with diabetes ([place])    Poor control off BP meds. Restart.  Follow up with cardiology as planned.      IBS (irritable bowel syndrome)    This appears to likely be cause of her symptoms.. she fluctuates between diarrhea and constipation.  Recommended daily fiber, and increase water intake. She is not interested in referral or medication.       Inflamed seborrheic keratosis    Treated with cryotherapy.      Neuropathy due to type 2 diabetes mellitus ([facility])    Symptoms returned ff gabapentin.Marland Kitchen okay to use this particular med prn.      Noncompliance with medication regimen    She has no clear reasons why she is noncompliant. Does not  Clearly think meds are giving her SE. " I just don't want to take any meds sometime, I get tired of it" The simplest routine for her is the best.      Thoracic back pain    Treat with heat and gentle stretching.       Other Visit Diagnoses    Need for 23-polyvalent pneumococcal polysaccharide vaccine       Relevant Orders   Pneumococcal polysaccharide vaccine 23-valent greater than or equal to 2yo subcutaneous/IM (Completed)       Eliezer Lofts, MD

## 2020-03-26 NOTE — Assessment & Plan Note (Signed)
Poor control off BP meds. Restart.  Follow up with cardiology as planned.

## 2020-03-26 NOTE — Patient Instructions (Addendum)
Start low back stretching. Start heat on low back . Take medication as on med list please. Start fiber.. metamucil fiber bar daily.. or Benefiber daily.

## 2020-03-26 NOTE — Assessment & Plan Note (Signed)
Treated with cryotherapy.

## 2020-03-26 NOTE — Assessment & Plan Note (Signed)
She has no clear reasons why she is noncompliant. Does not  Clearly think meds are giving her SE. " I just don't want to take any meds sometime, I get tired of it" The simplest routine for her is the best.

## 2020-03-26 NOTE — Assessment & Plan Note (Signed)
Treat with heat and gentle stretching.

## 2020-03-29 ENCOUNTER — Ambulatory Visit: Payer: HMO | Admitting: Cardiology

## 2020-03-29 NOTE — Telephone Encounter (Signed)
Please see notes below.

## 2020-03-31 ENCOUNTER — Encounter: Payer: Self-pay | Admitting: Family Medicine

## 2020-04-01 ENCOUNTER — Telehealth: Payer: Self-pay
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
## 2020-04-02 ENCOUNTER — Other Ambulatory Visit: Payer: Self-pay | Admitting: Family Medicine

## 2020-04-02 DIAGNOSIS — Z1231 Encounter for screening mammogram for malignant neoplasm of breast: Secondary | ICD-10-CM

## 2020-04-22 ENCOUNTER — Other Ambulatory Visit: Payer: Self-pay

## 2020-04-22 ENCOUNTER — Ambulatory Visit
Admission: RE | Admit: 2020-04-22 | Discharge: 2020-04-22 | Disposition: A | Discharge status: Home / Self Care | Payer: HMO | Source: Ambulatory Visit | Attending: Family Medicine | Admitting: Family Medicine

## 2020-04-22 DIAGNOSIS — Z1231 Encounter for screening mammogram for malignant neoplasm of breast: Secondary | ICD-10-CM | POA: Insufficient documentation

## 2020-05-04 ENCOUNTER — Other Ambulatory Visit: Payer: Self-pay

## 2020-05-04 ENCOUNTER — Ambulatory Visit: Payer: HMO | Admitting: Cardiology

## 2020-05-04 ENCOUNTER — Encounter: Payer: Self-pay | Admitting: Cardiology

## 2020-05-04 VITALS — BP 170/98 | HR 80 | Ht 61.0 in | Wt 184.0 lb

## 2020-05-04 DIAGNOSIS — I10 Essential (primary) hypertension: Secondary | ICD-10-CM

## 2020-05-04 DIAGNOSIS — R0602 Shortness of breath: Secondary | ICD-10-CM

## 2020-05-04 NOTE — Patient Instructions (Signed)
Medication Instructions:  Your physician recommends that you continue on your current medications as directed. Please refer to the Current Medication list given to you today.  *If you need a refill on your cardiac medications before your next appointment, please call your pharmacy*   Lab Work: None ordered If you have labs (blood work) drawn today and your tests are completely normal, you will receive your results only by: MyChart Message (if you have MyChart) OR A paper copy in the mail If you have any lab test that is abnormal or we need to change your treatment, we will call you to review the results.   Testing/Procedures: None ordered   Follow-Up: At [facility], you and your health needs are our priority.  As part of our continuing mission to provide you with exceptional heart care, we have created designated Provider Care Teams.  These Care Teams include your primary Cardiologist (physician) and Advanced Practice Providers (APPs -  Physician Assistants and Nurse Practitioners) who all work together to provide you with the care you need, when you need it.  We recommend signing up for the patient portal called "MyChart".  Sign up information is provided on this After Visit Summary.  MyChart is used to connect with patients for Virtual Visits (Telemedicine).  Patients are able to view lab/test results, encounter notes, upcoming appointments, etc.  Non-urgent messages can be sent to your provider as well.   To learn more about what you can do with MyChart, go to [email or web address].    Your next appointment:   3 month(s)  The format for your next appointment:   In Person  Provider:   Brian Agbor-Etang, MD   Other Instructions   

## 2020-05-04 NOTE — Progress Notes (Signed)
Cardiology Office Note:    Date:  05/04/2020   ID:  Patricia Horne, DOB 13-Apr-1950, MRN [id number]  PCP:  Jinny Sanders, MD  [facility] Cardiologist:  Kate Sable, MD  [place] Electrophysiologist:  None   Referring MD: Jinny Sanders, MD   Chief Complaint  Patient presents with  . Other    6-8 week follow up - Patient c.o some swelling in ankles. Meds reviewed verbally with patient.     History of Present Illness:    Patricia Horne is a 70 y.o. female with a hx of hypertension, diabetes who presents for follow-up.   Patient being seen due to uncontrolled blood pressures.  Losartan was decreased to 25 mg twice daily, due to patient developing dizziness.  Amlodipine 5 mg twice daily was started.  She states having some swelling in her ankles when she is on her feet for too long.  She does not take medications as prescribed, takes BP meds may be 3 days a week.  Has no adverse effects with current doses of medications.  Has not exercised or walked as previously recommended.   Prior notes She had an ETT on 07/2014 which was normal. Echo 90/3009, normal systolic function, EF 23-30, impaired relaxation. Gets dizzy with higher doses of losartan.  Past Medical History:  Diagnosis Date  . Abdominal pain, left lower quadrant   . Acute upper respiratory infections of unspecified site   . Allergic genetic state   . Arthritis    in arms  . Glaucoma   . Lipoma of unspecified site   . Neuropathy   . Obesity   . Other and unspecified noninfectious gastroenteritis and colitis(558.9)   . Other screening mammogram   . Routine general medical examination at a health care facility   . Screening for lipoid disorders   . Special screening for osteoporosis   . Tibialis tendinitis   . Tubular adenoma of colon 03/31/2014  . Type II or unspecified type diabetes mellitus without mention of complication, not stated as uncontrolled   . Undiagnosed cardiac murmurs   .  Unspecified essential hypertension     Past Surgical History:  Procedure Laterality Date  . CATARACT EXTRACTION W/PHACO Left 09/29/2019   Procedure: CATARACT EXTRACTION PHACO AND INTRAOCULAR LENS PLACEMENT (IOC) LEFT DIABETIC 3.42 00:32.9;  Surgeon: Eulogio Bear, MD;  Location: [place];  Service: Ophthalmology;  Laterality: Left;  . COLONOSCOPY  04/01/2004  . COLONOSCOPY  03/31/2014  . COLONOSCOPY WITH PROPOFOL N/A 01/01/2018   Procedure: COLONOSCOPY WITH PROPOFOL;  Surgeon: Lollie Sails, MD;  Location: [facility] ENDOSCOPY;  Service: Endoscopy;  Laterality: N/A;  . LIPOMA EXCISION Left 2010   arm  . TOE SURGERY  1990's   bunion, hammer toe repair  . TOTAL ABDOMINAL HYSTERECTOMY  1983   one ovary removed, later other ovary removed    Current Medications: Current Meds  Medication Sig  . acetaminophen (TYLENOL) 500 MG tablet Take 500 mg by mouth every 6 (six) hours as needed.  Marland Kitchen amLODipine (NORVASC) 5 MG tablet Take 1 tablet (5 mg total) by mouth in the morning and at bedtime.  . Cholecalciferol (VITAMIN D3) 1.25 MG (50000 UT) CAPS 50, 000 units weekly  . Continuous Blood Gluc Receiver (FREESTYLE LIBRE 14 DAY READER) DEVI 1 EACH BY DOES NOT APPLY ROUTE 3 (THREE) TIMES DAILY AS NEEDED.  Marland Kitchen Continuous Blood Gluc Sensor (FREESTYLE LIBRE 14 DAY SENSOR) MISC 1 UNITS BY DOES NOT APPLY ROUTE EVERY 3 (  THREE) DAYS AS NEEDED.  . Dulaglutide (TRULICITY) 3.00 TM/2.2QJ SOPN Inject 0.75 mg into the skin once a week.  . gabapentin (NEURONTIN) 100 MG capsule Take 100 mg by mouth at bedtime. Start 100 mg at bedtime but can increase to 200 mg or 300 mg if tolerated  . glucose monitoring kit (FREESTYLE) monitoring kit 1 each by Does not apply route as needed for other.  . 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 loperamide (IMODIUM) 2 MG capsule Take by mouth as needed for diarrhea or loose stools.  Marland Kitchen losartan (COZAAR) 25 MG tablet Take 1 tablet  (25 mg total) by mouth in the morning and at bedtime.     Allergies:   Patient has no known allergies.   Social History   Socioeconomic History  . Marital status: Single    Spouse name: Not on file  . Number of children: 0  . Years of education: Not on file  . Highest education level: Not on file  Occupational History  . Occupation: Control and instrumentation engineer, Museum/gallery curator at [employer]  . Smoking status: Never Smoker  . Smokeless tobacco: Never Used  Vaping Use  . Vaping Use: Never used  Substance and Sexual Activity  . Alcohol use: No    Alcohol/week: 0.0 standard drinks  . Drug use: No  . Sexual activity: Not Currently  Other Topics Concern  . Not on file  Social History Narrative   Caffeine use: soda/Peps[employer]   Fast food.  Regular exercise at work.   No sex.   Social Determinants of Health   Financial Resource Strain: Low Risk   . Difficulty of Paying Living Expenses: Not very hard  Food Insecurity: Not on file  Transportation Needs: Not on file  Physical Activity: Not on file  Stress: Not on file  Social Connections: Not on file     Family History: The patient's family history includes Arthritis in her father; Colon polyps in her paternal grandfather; Diabetes in her brother and mother; Ulcerative colitis in her paternal grandmother. There is no history of Breast cancer.  ROS:   Please see the history of present illness.     All other systems reviewed and are negative.  EKGs/Labs/Other Studies Reviewed:    The following studies were reviewed today:   EKG:  EKG is ordered today.  EKG shows normal sinus rhythm.  Recent Labs: 09/19/2019: ALT 12; BUN 25; Creatinine, Ser 0.87; Potassium 4.1; Sodium 142 09/23/2019: Hemoglobin 12.8; Platelets 267.0; TSH 1.07  Recent Lipid Panel    Component Value Date/Time   CHOL 159 09/19/2019 0910   TRIG 58.0 09/19/2019 0910   HDL 50.50 09/19/2019 0910   CHOLHDL 3 09/19/2019 0910   VLDL 11.6 09/19/2019 0910   LDLCALC 97  09/19/2019 0910    Physical Exam:    VS:  BP (!) 170/98 (BP Location: Right Arm, Patient Position: Sitting, Cuff Size: Normal)   Pulse 80   Ht _0  (1.549 m)   Wt 184 lb (83.5 kg)   SpO2 97%   BMI 34.77 kg/m     Wt Readings from Last 3 Encounters:  05/04/20 184 lb (83.5 kg)  03/26/20 176 lb (79.8 kg)  01/27/20 175 lb (79.4 kg)     GEN:  Well nourished, well developed in no acute distress HEENT: Normal NECK: No JVD; No carotid bruits LYMPHATICS: No lymphadenopathy CARDIAC: RRR, no murmurs, rubs, gallops RESPIRATORY:  Clear to auscultation without  rales, wheezing or rhonchi  ABDOMEN: Soft, non-tender, non-distended  MUSCULOSKELETAL:  No edema; No deformity  SKIN: Warm and dry NEUROLOGIC:  Alert and oriented x 3 PSYCHIATRIC:  Normal affect   ASSESSMENT:    1. Primary hypertension   2. Shortness of breath    PLAN:    In order of problems listed above:   1. Hypertension, BP elevated.  Patient not compliant with medications.  Patient advised to take medications as prescribed.  Continue losartan 25 mg twice daily, amlodipine 5 mg twice daily. 2. Nonspecific shortness of breath, likely from deconditioning.  Graduated exercising/walking again advised.  Follow-up in 3 months   Medication Adjustments/Labs and Tests Ordered: Current medicines are reviewed at length with the patient today.  Concerns regarding medicines are outlined above.  Orders Placed This Encounter  Procedures  . EKG 12-Lead   No orders of the defined types were placed in this encounter.   Patient Instructions  Medication Instructions:  Your physician recommends that you continue on your current medications as directed. Please refer to the Current Medication list given to you today.  *If you need a refill on your cardiac medications before your next appointment, please call your pharmacy*   Lab Work: None ordered If you have labs (blood work) drawn today and your tests are completely normal,  you will receive your results only by: Marland Kitchen MyChart Message (if you have MyChart) OR . A paper copy in the mail If you have any lab test that is abnormal or we need to change your treatment, we will call you to review the results.   Testing/Procedures: None ordered   Follow-Up: At [facility], you and your health needs are our priority.  As part of our continuing mission to provide you with exceptional heart care, we have created designated Provider Care Teams.  These Care Teams include your primary Cardiologist (physician) and Advanced Practice Providers (APPs -  Physician Assistants and Nurse Practitioners) who all work together to provide you with the care you need, when you need it.  We recommend signing up for the patient portal called "MyChart".  Sign up information is provided on this After Visit Summary.  MyChart is used to connect with patients for Virtual Visits (Telemedicine).  Patients are able to view lab/test results, encounter notes, upcoming appointments, etc.  Non-urgent messages can be sent to your provider as well.   To learn more about what you can do with MyChart, go to [email or web address].    Your next appointment:   3 month(s)  The format for your next appointment:   In Person  Provider:   Kate Sable, MD   Other Instructions      Signed, Kate Sable, MD  05/04/2020 9:57 AM    [place]

## 2020-05-05 DIAGNOSIS — E113313 Type 2 diabetes mellitus with moderate nonproliferative diabetic retinopathy with macular edema, bilateral: Secondary | ICD-10-CM | POA: Diagnosis not present

## 2020-05-11 ENCOUNTER — Ambulatory Visit (INDEPENDENT_AMBULATORY_CARE_PROVIDER_SITE_OTHER): Payer: HMO

## 2020-05-11 ENCOUNTER — Telehealth: Payer: Self-pay

## 2020-05-11 ENCOUNTER — Other Ambulatory Visit: Payer: Self-pay

## 2020-05-11 DIAGNOSIS — Z9114 Patient's other noncompliance with medication regimen: Secondary | ICD-10-CM

## 2020-05-11 DIAGNOSIS — E1365 Other specified diabetes mellitus with hyperglycemia: Secondary | ICD-10-CM

## 2020-05-11 DIAGNOSIS — I152 Hypertension secondary to endocrine disorders: Secondary | ICD-10-CM | POA: Diagnosis not present

## 2020-05-11 DIAGNOSIS — IMO0002 Reserved for concepts with insufficient information to code with codable children: Secondary | ICD-10-CM

## 2020-05-11 DIAGNOSIS — E1349 Other specified diabetes mellitus with other diabetic neurological complication: Secondary | ICD-10-CM

## 2020-05-11 DIAGNOSIS — E1159 Type 2 diabetes mellitus with other circulatory complications: Secondary | ICD-10-CM | POA: Diagnosis not present
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
## 2020-05-11 NOTE — Chronic Care Management (AMB) (Addendum)
    Chronic Care Management Pharmacy Assistant   Name: Shayana Hornstein  MRN: [id number] DOB: 07-Jun-1950   Reason for Encounter: [facility] Onboard    Medications: Outpatient Encounter Medications as of 05/11/2020  Medication Sig   acetaminophen (TYLENOL) 500 MG tablet Take 500 mg by mouth every 6 (six) hours as needed.   amLODipine (NORVASC) 5 MG tablet Take 1 tablet (5 mg total) by mouth in the morning and at bedtime. (Patient not taking: Reported on 05/11/2020)   Cholecalciferol (VITAMIN D3) 1.25 MG (50000 UT) CAPS 50, 000 units weekly (Patient not taking: Reported on 05/11/2020)   Continuous Blood Gluc Receiver (FREESTYLE LIBRE 14 DAY READER) [place]VI 1 EACH BY DOES NOT APPLY ROUTE 3 (THREE) TIMES DAILY AS NEEDED.   Continuous Blood Gluc Sensor (FREESTYLE LIBRE 14 DAY SENSOR) MISC 1 UNITS BY DOES NOT APPLY ROUTE EVERY 3 (THREE) DAYS AS NEEDED.   Dulaglutide (TRULICITY) 0.09 FG/1.8EX SOPN Inject 0.75 mg into the skin once a week.   gabapentin (NEURONTIN) 100 MG capsule Take 100 mg by mouth at bedtime. Start 100 mg at bedtime but can increase to 200 mg or 300 mg if tolerated   glucose monitoring kit (FREESTYLE) monitoring kit 1 each by Does not apply route as needed for other.   Insulin Pen Needle (PEN NEEDLES) 31G X 8 MM MISC Pen needles for Trulicity, H37.169   loperamide (IMODIUM) 2 MG capsule Take by mouth as needed for diarrhea or loose stools.   losartan (COZAAR) 25 MG tablet Take 1 tablet (25 mg total) by mouth in the morning and at bedtime.   No facility-administered encounter medications on file as of 05/11/2020.   Following appointment with [name] on 05/11/20 patient requested to onboard with [facility]. Medication count was completed at visit and onboarding form was completed. Contacted [facility] in [place] to request medication transfer to [facility] to coordinate patient's medications for delivery, med sync, and adherence packaging from [facility].  Follow-Up:  Coordination of Enhanced Pharmacy Services and Pharmacist Review  [name], CPP notified  [name], [place] Pharmacy Assistant [phone]  I have reviewed the care management and care coordination activities outlined in this encounter and I am certifying that I agree with the content of this note. Requested new rx for Trulicity and gabapentin from PCP as these had expired.  [name], PharmD Clinical Pharmacist [facility] [phone]

## 2020-05-11 NOTE — Patient Instructions (Signed)
Dear Patricia Horne,  Below is a summary of the goals we discussed during our follow up appointment on May 11, 2020. Please contact me anytime with questions or concerns.   Visit Information  Patient Care Plan: CCM Pharmacy Care Plan    Problem Identified: CHL AMB "PATIENT-SPECIFIC PROBLEM"     Long-Range Goal: Disease Management   Start Date: 05/11/2020  Priority: High  Note:    Current Barriers:  . Difficulty taking medications as prescribed  Pharmacist Clinical Goal(s):  Marland Kitchen Patient will adhere to prescribed medication regimen as evidenced by pt report and refill history through collaboration with PharmD and provider.   Interventions: . 1:1 collaboration with Jinny Sanders, MD regarding development and update of comprehensive plan of care as evidenced by provider  . and co-signature . Inter-disciplinary care team collaboration (see longitudinal plan of care) . Comprehensive medication review performed; medication list updated in electronic medical record  Hypertension (BP goal <140/90) -Uncontrolled - home BP has been high per CCM BP log notes, clinic readings variable -Has not taken BP meds since cardio visit last week, was taking about three days/week  -Current treatment: . Losartan 25 mg - 1 tablet twice daily . Amlodipine 5 mg - 1 tablet twice daily  -Medications previously tried: BP meds are dosed twice daily to help with dizziness  -Current home readings: elevated when she does not take her meds, has not checked recently  -Current dietary habits: reports poor diet, high sodium -Current exercise habits: none  -She reports weight gain of 10 lbs. -Denies hypotensive/hypertensive symptoms -Educated on Importance of taking medications daily. She is interested in trying adherence packaging. She states she wants to do better and live a long life. -Counseled to monitor BP at home twice weekly, document, and provide log at future appointments -Recommended resume  medications as prescribed. Trial adherence packs.  Hyperlipidemia: (LDL goal < 100) -Controlled per LDL 97, but not on appropriate therapy -Current treatment: . NONE -Medications previously tried: statin intolerant -Educated on Importance of limiting foods high in cholesterol; Exercise goal of 150 minutes per week; -Recommend increase exercise   Diabetes (A1c goal <7%) -Not ideally controlled - A1c 7.3% -Current medications:  Trulicity 0.96 mg - Inject once weekly on Sundays (not taking)  Freestyle Libre -Medications previously tried: Metformin, Jardiance  -Pt off Trulicity since beginning of 2022. She prefers injection over tablets but still some concern the Trulicity may be worsening her GI upset, diarrhea, gas. She did notice some improvement off therapy, still gas but less diarrhea per pt. She states Dr. Diona Browner did not think it was medication related and she plans to resume therapy on Sunday 05/16/20. -Current home glucose readings - none to report today . fasting glucose: n/a . post prandial glucose: n/a -Denies hypoglycemic/hyperglycemic symptoms --Educated on Eating smaller meals, low in fat and carbs while on Trulicity -Counseled to check feet daily and get yearly eye exams -Recommended  resuming Trulicity weekly  Vit D Deficiency (Goal: Vit D WNL) -Unable to assess - vit D has not been updated since pt started high dose weekly  -Current treatment  . Vitamin D3 50,000 IU - once weekly  -Medications previously tried: none -Pt not taking vitamin D3 for unknown amount of time. Encouraged to resume. -Recommended resume vitamin D3 weekly  Patient Goals/Self-Care Activities . Patient will:  - focus on medication adherence by starting adherence packaging through [facility]  Follow Up Plan: The care management team will reach out to the patient again over  the next 30 days.  Will check in every 30 days for adherence review.       Patient verbalizes understanding of  instructions provided today and agrees to view in [place].   [name], PharmD Clinical Pharmacist [facility] [phone]   Basics of Medicine Management Taking your medicines correctly is an important part of managing or preventing medical problems. Make sure you know what disease or condition your medicine is treating, and how and when to take it. If you do not take your medicine correctly, it may not work well and may cause unpleasant side effects, including serious health problems. What should I do when I am taking medicines?  Read all the labels and inserts that come with your medicines. Review the information often.  Talk with your pharmacist if you get a refill and notice a change in the size, color, or shape of your medicines.  Know the potential side effects for each medicine that you take.  Try to get all your medicines from the same pharmacy. The pharmacist will have all your information and will understand how your medicines will affect each other (interact).  Tell your health care provider about all your medicines, including over-the-counter medicines, vitamins, and herbal or dietary supplements. He or she will make sure that nothing will interact with any of your prescribed medicines.   How can I take my medicines safely?  Take medicines only as told by your health care provider. ? Do not take more of your medicine than instructed. ? Do not take anyone else's medicines. ? Do not share your medicines with others. ? Do not stop taking your medicines unless your health care provider tells you to do so. ? You may need to avoid alcohol or certain foods or liquids when taking certain medicines. Follow your health care provider's instructions.  Do not split, mash, or chew your medicines unless your health care provider tells you to do so. Tell your health care provider if you have trouble swallowing your medicines.  For liquid medicine, use the  dosing container that was provided. How should I organize my medicines? Know your medicines  Know what each of your medicines looks like. This includes size, color, and shape. Tell your health care provider if you are having trouble recognizing all the medicines that you are taking.  If you cannot tell your medicines apart because they look similar, keep them in original bottles.  If you cannot read the labels on the bottles, tell your pharmacist to put your medicines in containers with large print.  Review your medicines and your schedule with family members, a friend, or a caregiver. Use a pill organizer  Use a tool to organize your medicine schedule. Tools include a weekly pillbox, a written chart, a notebook, or a calendar.  Your tool should help you remember the following things about each medicine: ? The name of the medicine. ? The amount (dose) to take. ? The schedule. This is the day and time the medicine should be taken. ? The appearance. This includes color, shape, size, and stamp. ? How to take your medicines. This includes instructions to take them with food, without food, with fluids, or with other medicines.  Create reminders for taking your medicines. Use sticky notes, or alarms on your watch, mobile device, or phone calendar.  You may choose to use a more advanced management system. These systems have storage, alarms, and visual and audio prompts.  Some medicines can be  taken on an "as-needed" basis. These include medicines for nausea or pain. If you take an as-needed medicine, write down the name and dose, as well as the date and time that you took it.   How should I plan for travel?  Take your pillbox, medicines, and organization system with you when traveling.  Have your medicines refilled before you travel. This will ensure that you do not run out of your medicines while you are away from home.  Always carry an updated list of your medicines with you. If there is  an emergency, a first responder can quickly see what medicines you are taking.  Do not pack your medicines in checked luggage in case your luggage is lost or delayed.  If any of your medicines is considered a controlled substance, make sure you bring a letter from your health care provider with you. How should I store and discard my medicines? For safe storage:  Store medicines in a cool, dry area away from light, or as directed by your health care provider. Do not store medicines in the bathroom. Heat and humidity will affect them.  Do not store your medicines with other chemicals, or with medicines for pets or other household members.  Keep medicines away from children and pets. Do not leave them on counters or bedside tables. Store them in high cabinets or on high shelves. For safe disposal:  Check expiration dates regularly. Do not take expired medicines. Discard medicines that are older than the expiration date.  Learn a safe way to dispose of your medicines. You may: ? Use a local government, hospital, or pharmacy medicine-take-back program. ? Mix the medicines with inedible substances, put them in a sealed bag or empty container, and throw them in the trash. What should I remember?  Tell your health care provider if you: ? Experience side effects. ? Have new symptoms. ? Have other concerns about taking your medicines.  Review your medicines regularly with your health care provider. Other medicines, diet, medical conditions, weight changes, and daily habits can all affect how medicines work. Ask if you need to continue taking each medicine, and discuss how well each one is working.  Refill your medicines early to avoid running out of them.  In case of an accidental overdose, call your local [place] at [phone] or visit your local emergency department immediately. This is important. Summary  Taking your medicines correctly is an important part of managing or  preventing medical problems.  You need to make sure that you understand what you are taking a medicine for, as well as how and when you need to take it.  Know your medicines and use a pill organizer to help you take your medicines correctly.  In case of an accidental overdose, call your local [place] at [phone] or visit your local emergency department immediately. This is important. This information is not intended to replace advice given to you by your health care provider. Make sure you discuss any questions you have with your health care provider. Document Revised: 01/11/2017 Document Reviewed: 01/11/2017 Elsevier Patient Education  2021 [employer].

## 2020-05-13 NOTE — Progress Notes (Signed)
Encounter details: CCM Time Spent       Value Time User   Time spent with patient (minutes)  50 05/11/2020 12:31 PM [name], [facility]   Time spent performing Chart review  30 05/11/2020 12:31 PM [name], [facility]   Total time (minutes)  80 05/11/2020 12:31 PM [name], RPH      Moderate to High Complex Decision Making       Value Time User   Moderate to High complex decision making  Yes 05/11/2020 12:30 PM [name], [facility]      CCM Services: This encounter meets complex CCM services and moderate to high decision making.  Prior to outreach and patient consent for Chronic Care Management, I referred this patient for services after reviewing the nominated patient list or from a personal encounter with the patient.  I have personally reviewed this encounter including the documentation in this note and have collaborated with the care management provider regarding care management and care coordination activities to include development and update of the comprehensive care plan. I am certifying that I agree with the content of this note and encounter as supervising physician.

## 2020-05-17 ENCOUNTER — Other Ambulatory Visit: Payer: Self-pay | Admitting: *Deleted

## 2020-05-17 MED ORDER — TRULICITY 0.75 MG/0.5ML ~~LOC~~ SOAJ: 0.7500 mg | SUBCUTANEOUS | 3 refills | Status: DC

## 2020-05-17 MED ORDER — GABAPENTIN 100 MG PO CAPS: 300.0000 mg | ORAL_CAPSULE | Freq: Every day | ORAL | 0 refills | Status: DC

## 2020-05-17 NOTE — Telephone Encounter (Signed)
Last office visit 03/26/2020 for DM.  Last refilled?  Listed as historical on mediation list.  Next Appt: 06/29/2020 for 3 month follow up DM.

## 2020-05-17 NOTE — Telephone Encounter (Signed)
-----   Message from [place], [facility] sent at 05/14/2020 10:03 AM EDT ----- Regarding: Refills Requesting refills on Trulicity and gabapentin to [facility]. Both expired. Thanks!  [name], PharmD Clinical Pharmacist [facility] at [facility] [phone]

## 2020-05-20 DIAGNOSIS — H2000 Unspecified acute and subacute iridocyclitis: Secondary | ICD-10-CM | POA: Diagnosis not present

## 2020-05-27 DIAGNOSIS — H2000 Unspecified acute and subacute iridocyclitis: Secondary | ICD-10-CM | POA: Diagnosis not present

## 2020-06-07 ENCOUNTER — Telehealth: Payer: Self-pay

## 2020-06-07 NOTE — Chronic Care Management (AMB) (Addendum)
Chronic Care Management Pharmacy Assistant   Name: Rylen Hou  MRN: [id number] DOB: 03/03/1950  Reason for Encounter: Medication Review - Medication Adherence and Delivery Coordination  Recent office visits:  None since last CCM contact  Recent consult visits:  None since last CCM contact  Hospital visits:  None in previous 6 months  Medications: Outpatient Encounter Medications as of 06/07/2020  Medication Sig   acetaminophen (TYLENOL) 500 MG tablet Take 500 mg by mouth every 6 (six) hours as needed.   amLODipine (NORVASC) 5 MG tablet Take 1 tablet (5 mg total) by mouth in the morning and at bedtime. (Patient not taking: Reported on 05/11/2020)   Cholecalciferol (VITAMIN D3) 1.25 MG (50000 UT) CAPS 50, 000 units weekly (Patient not taking: Reported on 05/11/2020)   Continuous Blood Gluc Receiver (FREESTYLE LIBRE 14 DAY READER) DEVI 1 EACH BY DOES NOT APPLY ROUTE 3 (THREE) TIMES DAILY AS NEEDED.   Continuous Blood Gluc Sensor (FREESTYLE LIBRE 14 DAY SENSOR) MISC 1 UNITS BY DOES NOT APPLY ROUTE EVERY 3 (THREE) DAYS AS NEEDED.   Dulaglutide (TRULICITY) 8.41 YS/0.6TK SOPN Inject 0.75 mg into the skin once a week.   gabapentin (NEURONTIN) 100 MG capsule Take 3 capsules (300 mg total) by mouth at bedtime.   glucose monitoring kit (FREESTYLE) monitoring kit 1 each by Does not apply route as needed for other.   Insulin Pen Needle (PEN NEEDLES) 31G X 8 MM MISC Pen needles for Trulicity, Z60.109   loperamide (IMODIUM) 2 MG capsule Take by mouth as needed for diarrhea or loose stools.   losartan (COZAAR) 25 MG tablet Take 1 tablet (25 mg total) by mouth in the morning and at bedtime. (Patient not taking: Reported on 05/11/2020)   No facility-administered encounter medications on file as of 06/07/2020.   BP Readings from Last 3 Encounters:  05/04/20 (!) 170/98  03/26/20 138/90  01/27/20 (!) 144/104    Lab Results  Component Value Date   HGBA1C 7.3 (A) 03/26/2020      No OVs,  Consults, or hospital visits since last care coordination call / Pharmacist visit. No medication changes indicated   Patient obtains medications through Adherence Packaging  30 Days   Last adherence delivery date: 05/18/20 30 DS  Patient is due for next adherence delivery on: 06/15/20  Spoke with patient on 06/07/20 and reviewed medications and coordinated delivery.  This delivery to include: Adherence Packaging  30 Days  Packs: Losartan- 1 tablet twice daily (breakfast, bedtime) Amlodipine 5 mg- 1 tablet twice daily (breakfast, bedtime) Vitamin D3 1.25 mg (50,000 units) Take 1 capsule weekly on Sunday (Breakfast) Gabapentin 100 mg- Take 3 capsules daily (3-bedtime) PRN/VIAL medications: Freestyle Libre Sensors  Patient declined the following medications this month: Trulicity 3.23 FT/7.3 mL- Inject once weekly on Sunday- states she has 1 month worth.  No refill request needed from PCP.  Confirmed delivery date of 06/15/20, advised patient that pharmacy will contact her the morning of delivery.  Recent blood pressure readings are as follows: 160/80  Recent blood glucose readings are as follows: Fasting: 200  States she is still having upset stomach at times. She is getting better with medication adherence but not checking her BG and BP as often as she should. She notes she is getting used to her medications being packaged.   Follow-Up:  Coordination of Enhanced Pharmacy Services and Pharmacist Review  [name], CPP notified  [name], [place] Pharmacy Assistant [phone]  I have reviewed the care  management and care coordination activities outlined in this encounter and I am certifying that I agree with the content of this note. No further action required.  [name], PharmD Clinical Pharmacist [facility] at [facility] [phone]

## 2020-06-09 DIAGNOSIS — E113313 Type 2 diabetes mellitus with moderate nonproliferative diabetic retinopathy with macular edema, bilateral: Secondary | ICD-10-CM | POA: Diagnosis not present

## 2020-06-14 LAB — HM DIABETES FOOT EXAM

## 2020-06-15 ENCOUNTER — Telehealth: Payer: Self-pay | Admitting: Family Medicine

## 2020-06-15 DIAGNOSIS — IMO0002 Reserved for concepts with insufficient information to code with codable children: Secondary | ICD-10-CM

## 2020-06-15 DIAGNOSIS — E1365 Other specified diabetes mellitus with hyperglycemia: Secondary | ICD-10-CM

## 2020-06-15 NOTE — Telephone Encounter (Signed)
-----   Message from Ellamae Sia sent at 06/07/2020 10:47 AM EDT ----- Regarding: Lab orders for Tuesday, 5.24.22 Lab orders for a 3 month follow up appt.

## 2020-06-22 ENCOUNTER — Other Ambulatory Visit: Payer: Self-pay

## 2020-06-22 ENCOUNTER — Other Ambulatory Visit (INDEPENDENT_AMBULATORY_CARE_PROVIDER_SITE_OTHER): Payer: HMO

## 2020-06-22 DIAGNOSIS — E1349 Other specified diabetes mellitus with other diabetic neurological complication: Secondary | ICD-10-CM | POA: Diagnosis not present

## 2020-06-22 DIAGNOSIS — IMO0002 Reserved for concepts with insufficient information to code with codable children: Secondary | ICD-10-CM

## 2020-06-22 DIAGNOSIS — E1365 Other specified diabetes mellitus with hyperglycemia: Secondary | ICD-10-CM | POA: Diagnosis not present

## 2020-06-22 LAB — LIPID PANEL
Cholesterol: 159 mg/dL (ref 0–200)
HDL: 46.4 mg/dL (ref >39.00)
LDL Cholesterol: 91 mg/dL (ref 0–99)
NonHDL: 112.45
Total CHOL/HDL Ratio: 3
Triglycerides: 108 mg/dL (ref 0.0–149.0)
VLDL: 21.6 mg/dL (ref 0.0–40.0)

## 2020-06-22 LAB — COMPREHENSIVE METABOLIC PANEL
ALT: 11 U/L (ref 0–35)
AST: 11 U/L (ref 0–37)
Albumin: 3.7 g/dL (ref 3.5–5.2)
Alkaline Phosphatase: 81 U/L (ref 39–117)
BUN: 25 mg/dL — ABNORMAL HIGH (ref 6–23)
CO2: 29 mEq/L (ref 19–32)
Calcium: 9.4 mg/dL (ref 8.4–10.5)
Chloride: 103 mEq/L (ref 96–112)
Creatinine, Ser: 0.93 mg/dL (ref 0.40–1.20)
GFR: 62.58 mL/min (ref >60.00)
Glucose, Bld: 219 mg/dL — ABNORMAL HIGH (ref 70–99)
Potassium: 3.8 mEq/L (ref 3.5–5.1)
Sodium: 140 mEq/L (ref 135–145)
Total Bilirubin: 0.9 mg/dL (ref 0.2–1.2)
Total Protein: 7.3 g/dL (ref 6.0–8.3)

## 2020-06-22 LAB — HEMOGLOBIN A1C: Hgb A1c MFr Bld: 10 % — ABNORMAL HIGH (ref 4.6–6.5)

## 2020-06-22 NOTE — Progress Notes (Signed)
No critical labs need to be addressed urgently. We will discuss labs in detail at upcoming office visit.   

## 2020-06-29 ENCOUNTER — Encounter: Payer: Self-pay | Admitting: Family Medicine

## 2020-06-29 ENCOUNTER — Ambulatory Visit (INDEPENDENT_AMBULATORY_CARE_PROVIDER_SITE_OTHER): Payer: HMO | Admitting: Family Medicine

## 2020-06-29 ENCOUNTER — Other Ambulatory Visit: Payer: Self-pay

## 2020-06-29 ENCOUNTER — Ambulatory Visit: Payer: HMO | Admitting: Family Medicine

## 2020-06-29 VITALS — BP 172/94 | HR 81 | Temp 98.1°F | Ht 61.0 in | Wt 178.0 lb

## 2020-06-29 DIAGNOSIS — E1349 Other specified diabetes mellitus with other diabetic neurological complication: Secondary | ICD-10-CM

## 2020-06-29 DIAGNOSIS — IMO0002 Reserved for concepts with insufficient information to code with codable children: Secondary | ICD-10-CM

## 2020-06-29 DIAGNOSIS — E1365 Other specified diabetes mellitus with hyperglycemia: Secondary | ICD-10-CM | POA: Diagnosis not present

## 2020-06-29 DIAGNOSIS — Z9114 Patient's other noncompliance with medication regimen: Secondary | ICD-10-CM

## 2020-06-29 DIAGNOSIS — E1140 Type 2 diabetes mellitus with diabetic neuropathy, unspecified: Secondary | ICD-10-CM | POA: Diagnosis not present

## 2020-06-29 DIAGNOSIS — Z789 Other specified health status: Secondary | ICD-10-CM | POA: Diagnosis not present

## 2020-06-29 DIAGNOSIS — I152 Hypertension secondary to endocrine disorders: Secondary | ICD-10-CM

## 2020-06-29 DIAGNOSIS — E13319 Other specified diabetes mellitus with unspecified diabetic retinopathy without macular edema: Secondary | ICD-10-CM

## 2020-06-29 DIAGNOSIS — K582 Mixed irritable bowel syndrome: Secondary | ICD-10-CM | POA: Diagnosis not present

## 2020-06-29 DIAGNOSIS — E1159 Type 2 diabetes mellitus with other circulatory complications: Secondary | ICD-10-CM

## 2020-06-29 NOTE — Progress Notes (Signed)
Patient ID: Patricia Horne, female    DOB: 01/09/1951, 70 y.o.   MRN: [id number]  This visit was conducted in person.  Pulse 81   Temp 98.1 F (36.7 C) (Temporal)   Ht 5\' 1"  (1.549 m)   Wt 178 lb (80.7 kg)   SpO2 97%   BMI 33.63 kg/m    CC:  Chief Complaint  Patient presents with  . Diabetes    Follow up 3 months     Subjective:   HPI: Patricia Horne is a 70 y.o. female presenting on 06/29/2020 for Diabetes (Follow up 3 months/)  Diabetes:  Poor control.. was previously well controlled on low dose Trulicity  she has been very stressed out and this is causing trouble with her taking her medications.  she also has diarrhea when on Trulicity, occ stool incontinence/urgency... but is constipated off the medication.  Diarrhea can be controlled if she takes immodium prn. Lab Results  Component Value Date   HGBA1C 10.0 (H) 06/22/2020  Using medications without difficulties: Hypoglycemic episodes: Hyperglycemic episodes: Feet problems: none Blood Sugars averaging:not checking. eye exam within last year: recent OV 2 weeks ago.  DM assiated with retinopathy and neuropathy.  Hypertension:   Poor control.06/24/2020 despite losartan and amlodipine... still occ missing doses... she feels more comfortable spliting dose. BP Readings from Last 3 Encounters:  06/29/20 (!) 152/106  05/04/20 (!) 170/98  03/26/20 138/90  Using medication without problems or lightheadedness:  none Chest pain with exertion:none Edema:none Short of breath:none Average home BPs: 148/96 Other issues:   she has gained 8 lbs.. she is planning on joining the gym.  Elevated Cholesterol:  LDL at goal < 100 with diet, she refuses statin despite need. Lab Results  Component Value Date   CHOL 159 06/22/2020   HDL 46.40 06/22/2020   LDLCALC 91 06/22/2020   TRIG 108.0 06/22/2020   CHOLHDL 3 06/22/2020  Using medications without problems: Muscle aches:  Diet compliance:poor ate fat back last  night. Exercise: none Other complaints:       Relevant past medical, surgical, family and social history reviewed and updated as indicated. Interim medical history since our last visit reviewed. Allergies and medications reviewed and updated. Outpatient Medications Prior to Visit  Medication Sig Dispense Refill  . acetaminophen (TYLENOL) 500 MG tablet Take 500 mg by mouth every 6 (six) hours as needed.    . Continuous Blood Gluc Receiver (FREESTYLE LIBRE 14 DAY READER) DEVI 1 EACH BY DOES NOT APPLY ROUTE 3 (THREE) TIMES DAILY AS NEEDED. 1 each 0  . Continuous Blood Gluc Sensor (FREESTYLE LIBRE 14 DAY SENSOR) MISC 1 UNITS BY DOES NOT APPLY ROUTE EVERY 3 (THREE) DAYS AS NEEDED. 4 each 3  . Dulaglutide (TRULICITY) 0.75 MG/0.5ML SOPN Inject 0.75 mg into the skin once a week. 9 mL 3  . gabapentin (NEURONTIN) 100 MG capsule Take 3 capsules (300 mg total) by mouth at bedtime. 270 capsule 0  . glucose monitoring kit (FREESTYLE) monitoring kit 1 each by Does not apply route as needed for other. 1 each 0  . Insulin Pen Needle (PEN NEEDLES) 31G X 8 MM MISC Pen needles for Trulicity, E11.319 50 each 11  . loperamide (IMODIUM) 2 MG capsule Take by mouth as needed for diarrhea or loose stools.    06/24/2020 losartan (COZAAR) 25 MG tablet Take 1 tablet (25 mg total) by mouth in the morning and at bedtime. 60 tablet 5  . Vitamin D, Ergocalciferol, (DRISDOL) 1.25 MG (  50000 UNIT) CAPS capsule Take 1 capsule by mouth once a week.    Marland Kitchen amLODipine (NORVASC) 5 MG tablet Take 1 tablet (5 mg total) by mouth in the morning and at bedtime. 60 tablet 5  . Cholecalciferol (VITAMIN D3) 1.25 MG (50000 UT) CAPS 50, 000 units weekly (Patient not taking: Reported on 05/11/2020) 12 capsule 3   No facility-administered medications prior to visit.     Per HPI unless specifically indicated in ROS section below Review of Systems  Constitutional: Negative for fatigue and fever.  HENT: Negative for congestion.   Eyes: Negative for  pain.  Respiratory: Negative for cough and shortness of breath.   Cardiovascular: Negative for chest pain, palpitations and leg swelling.  Gastrointestinal: Positive for constipation and diarrhea. Negative for abdominal pain.  Genitourinary: Negative for dysuria and vaginal bleeding.  Musculoskeletal: Negative for back pain.  Neurological: Negative for syncope, light-headedness and headaches.  Psychiatric/Behavioral: Negative for dysphoric mood.   Objective:  Pulse 81   Temp 98.1 F (36.7 C) (Temporal)   Ht 5\' 1"  (1.549 m)   Wt 178 lb (80.7 kg)   SpO2 97%   BMI 33.63 kg/m   Wt Readings from Last 3 Encounters:  06/29/20 178 lb (80.7 kg)  05/04/20 184 lb (83.5 kg)  03/26/20 176 lb (79.8 kg)      Physical Exam Constitutional:      General: She is not in acute distress.    Appearance: Normal appearance. She is well-developed. She is not ill-appearing or toxic-appearing.  HENT:     Head: Normocephalic.     Right Ear: Hearing, tympanic membrane, ear canal and external ear normal. Tympanic membrane is not erythematous, retracted or bulging.     Left Ear: Hearing, tympanic membrane, ear canal and external ear normal. Tympanic membrane is not erythematous, retracted or bulging.     Nose: No mucosal edema or rhinorrhea.     Right Sinus: No maxillary sinus tenderness or frontal sinus tenderness.     Left Sinus: No maxillary sinus tenderness or frontal sinus tenderness.     Mouth/Throat:     Pharynx: Uvula midline.  Eyes:     General: Lids are normal. Lids are everted, no foreign bodies appreciated.     Conjunctiva/sclera: Conjunctivae normal.     Pupils: Pupils are equal, round, and reactive to light.  Neck:     Thyroid: No thyroid mass or thyromegaly.     Vascular: No carotid bruit.     Trachea: Trachea normal.  Cardiovascular:     Rate and Rhythm: Normal rate and regular rhythm.     Pulses: Normal pulses.     Heart sounds: Normal heart sounds, S1 normal and S2 normal. No murmur  heard. No friction rub. No gallop.   Pulmonary:     Effort: Pulmonary effort is normal. No tachypnea or respiratory distress.     Breath sounds: Normal breath sounds. No decreased breath sounds, wheezing, rhonchi or rales.  Abdominal:     General: Bowel sounds are normal.     Palpations: Abdomen is soft.     Tenderness: There is no abdominal tenderness.  Musculoskeletal:     Cervical back: Normal range of motion and neck supple.  Skin:    General: Skin is warm and dry.     Findings: No rash.  Neurological:     Mental Status: She is alert.  Psychiatric:        Mood and Affect: Mood is not anxious or depressed.  Speech: Speech normal.        Behavior: Behavior normal. Behavior is cooperative.        Thought Content: Thought content normal.        Judgment: Judgment normal.    Diabetic foot exam: Normal inspection No skin breakdown No calluses  Normal DP pulses Normal sensation to light touch and monofilament Nails normal     Results for orders placed or performed in visit on 06/22/20  Comprehensive metabolic panel  Result Value Ref Range   Sodium 140 135 - 145 mEq/L   Potassium 3.8 3.5 - 5.1 mEq/L   Chloride 103 96 - 112 mEq/L   CO2 29 19 - 32 mEq/L   Glucose, Bld 219 (H) 70 - 99 mg/dL   BUN 25 (H) 6 - 23 mg/dL   Creatinine, Ser 0.93 0.40 - 1.20 mg/dL   Total Bilirubin 0.9 0.2 - 1.2 mg/dL   Alkaline Phosphatase 81 39 - 117 U/L   AST 11 0 - 37 U/L   ALT 11 0 - 35 U/L   Total Protein 7.3 6.0 - 8.3 g/dL   Albumin 3.7 3.5 - 5.2 g/dL   GFR 62.58 >60.00 mL/min   Calcium 9.4 8.4 - 10.5 mg/dL  Lipid panel  Result Value Ref Range   Cholesterol 159 0 - 200 mg/dL   Triglycerides 108.0 0.0 - 149.0 mg/dL   HDL 46.40 >39.00 mg/dL   VLDL 21.6 0.0 - 40.0 mg/dL   LDL Cholesterol 91 0 - 99 mg/dL   Total CHOL/HDL Ratio 3    NonHDL 112.45   Hemoglobin A1c  Result Value Ref Range   Hgb A1c MFr Bld 10.0 (H) 4.6 - 6.5 %    This visit occurred during the SARS-CoV-2 public  health emergency.  Safety protocols were in place, including screening questions prior to the visit, additional usage of staff PPE, and extensive cleaning of exam room while observing appropriate contact time as indicated for disinfecting solutions.   COVID 19 screen:  No recent travel or known exposure to COVID19 The patient denies respiratory symptoms of COVID 19 at this time. The importance of social distancing was discussed today.   Assessment and Plan Problem List Items Addressed This Visit    Diabetic retinopathy ([place])    Needs better control of both BP and DM for healthy eyes.  Encouraged her to get back on track with lifestyle and medication use.      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 encouraged pt to restart as A1C was at goal and pt had weight loss.  Likely bowel issues from IBS.      Hypertension associated with diabetes ([place])    Poor control with weight gain, high salt diet and medication non compliance. If BP remaining > 140/90 ... call for med increase of losartan. Conitnue amlodipine at max.      IBS (irritable bowel syndrome)    Offered med to treat.. she is not interested in medication at this time.  Encouraged fiber, water and can use immodium if diarrhea on  Discussed low grease diet, decrease stress. Info given.      Neuropathy due to type 2 diabetes mellitus (HCC)     Stable control on gabapentin.       Noncompliance with medication regimen    Reviewed medication use concerns and issues.      Statin intolerance    Continues to refuse statin medication.  Jasmyne Lodato, MD   

## 2020-06-29 NOTE — Assessment & Plan Note (Signed)
Reviewed medication use concerns and issues.

## 2020-06-29 NOTE — Assessment & Plan Note (Signed)
Continues to refuse statin medication.

## 2020-06-29 NOTE — Assessment & Plan Note (Signed)
Stable control on gabapentin.

## 2020-06-29 NOTE — Assessment & Plan Note (Signed)
Not clearly causing bowel issue... encouraged pt to restart as A1C was at goal and pt had weight loss.  Likely bowel issues from IBS.

## 2020-06-29 NOTE — Assessment & Plan Note (Signed)
Offered med to treat.. she is not interested in medication at this time.  Encouraged fiber, water and can use immodium if diarrhea on  Discussed low grease diet, decrease stress. Info given.

## 2020-06-29 NOTE — Assessment & Plan Note (Signed)
Needs better control of both BP and DM for healthy eyes.  Encouraged her to get back on track with lifestyle and medication use.

## 2020-06-29 NOTE — Assessment & Plan Note (Signed)
Poor control with weight gain, high salt diet and medication non compliance. If BP remaining > 140/90 ... call for med increase of losartan. Conitnue amlodipine at max.

## 2020-06-29 NOTE — Patient Instructions (Addendum)
Restart Trulicity.  Restart walking.  Make sure to take amlodipine and losartan twice daily... call if BP remaining above 140/90.

## 2020-07-06 ENCOUNTER — Telehealth: Payer: Self-pay

## 2020-07-06 NOTE — Chronic Care Management (AMB) (Addendum)
Chronic Care Management Pharmacy Assistant   Name: Patricia Horne  MRN: [id number] DOB: 07-Jun-1950  Reason for Encounter: Medication Adherence and Delivery Coordination  Recent office visits:  06/29/20- PCP- Ordered diabetic foot exam. Patient reported no longer taking Vitamin D but was encouraged to continue it at 05/17/20 visit with CPP. Discussed increase of losartan if BP remains >140/90.  Recent consult visits:  None since last CCM contact.   Hospital visits:  None in previous 6 months  Medications: Outpatient Encounter Medications as of 07/06/2020  Medication Sig   acetaminophen (TYLENOL) 500 MG tablet Take 500 mg by mouth every 6 (six) hours as needed.   amLODipine (NORVASC) 5 MG tablet Take 1 tablet (5 mg total) by mouth in the morning and at bedtime.   Continuous Blood Gluc Receiver (FREESTYLE LIBRE 14 DAY READER) DEVI 1 EACH BY DOES NOT APPLY ROUTE 3 (THREE) TIMES DAILY AS NEEDED.   Continuous Blood Gluc Sensor (FREESTYLE LIBRE 14 DAY SENSOR) MISC 1 UNITS BY DOES NOT APPLY ROUTE EVERY 3 (THREE) DAYS AS NEEDED.   Dulaglutide (TRULICITY) 0.24 OX/7.3ZH SOPN Inject 0.75 mg into the skin once a week.   gabapentin (NEURONTIN) 100 MG capsule Take 3 capsules (300 mg total) by mouth at bedtime.   glucose monitoring kit (FREESTYLE) monitoring kit 1 each by Does not apply route as needed for other.   Insulin Pen Needle (PEN NEEDLES) 31G X 8 MM MISC Pen needles for Trulicity, G99.242   loperamide (IMODIUM) 2 MG capsule Take by mouth as needed for diarrhea or loose stools.   losartan (COZAAR) 25 MG tablet Take 1 tablet (25 mg total) by mouth in the morning and at bedtime.   Vitamin D, Ergocalciferol, (DRISDOL) 1.25 MG (50000 UNIT) CAPS capsule Take 1 capsule by mouth once a week.   No facility-administered encounter medications on file as of 07/06/2020.   BP Readings from Last 3 Encounters:  06/29/20 (!) 172/94  05/04/20 (!) 170/98  03/26/20 138/90    Lab Results  Component Value  Date   HGBA1C 10.0 (H) 06/22/2020   Attempted contact with [place] 3 times on 07/06/20, 07/07/20, 07/09/20. Unsuccessful outreach.   Recent OV, Consult or Hospital visit: 06/29/20- PCP Recent medication changes indicated: None  Patient obtains medications through Adherence Packaging  30 Days   Last adherence delivery date: 06/15/20    Patient is due for next adherence delivery on: 07/16/20  Unable to speak with patient after 3+ attempts. 07/16/20 Delivery based off last adherence fill requests.   This delivery to include: Adherence Packaging  30 Days  Packs: Losartan 25 mg - 1 tablet twice daily (breakfast, bedtime) Amlodipine 5 mg - 1 tablet twice daily (breakfast, bedtime)  Vitamin D2 1250 mcg (50,000 units) - take 1 capsule weekly on Sunday (Breakfast) Gabapentin 100 mg  - take 3 capsules daily (3-bedtime)  PRN/VIAL medications: Freestyle libre sensors    Patient declined the following medications last month: Trulicity 6.83/4.1- inject 0.75 mg into skin once a week (Sundays)  No refill request needed from PCP.  Delivery date of 07/16/20.  Unable to reach patient to obtain BP or BG readings.   Follow-Up:  Coordination of Enhanced Pharmacy Services and Pharmacist Review  [name], CPP notified  [name], [place] Pharmacy Assistant [phone]  I have reviewed the care management and care coordination activities outlined in this encounter and I am certifying that I agree with the content of this note. No further action required.  [name], PharmD Clinical Pharmacist [facility] at [facility] [phone]

## 2020-07-21 DIAGNOSIS — E113313 Type 2 diabetes mellitus with moderate nonproliferative diabetic retinopathy with macular edema, bilateral: Secondary | ICD-10-CM | POA: Diagnosis not present

## 2020-08-10 ENCOUNTER — Ambulatory Visit: Payer: HMO | Admitting: Cardiology

## 2020-09-08 DIAGNOSIS — E113313 Type 2 diabetes mellitus with moderate nonproliferative diabetic retinopathy with macular edema, bilateral: Secondary | ICD-10-CM | POA: Diagnosis not present

## 2020-09-21 ENCOUNTER — Other Ambulatory Visit: Payer: Self-pay | Admitting: Family Medicine

## 2020-09-21 ENCOUNTER — Telehealth: Payer: Self-pay

## 2020-09-21 DIAGNOSIS — K529 Noninfective gastroenteritis and colitis, unspecified: Secondary | ICD-10-CM

## 2020-09-21 NOTE — Telephone Encounter (Addendum)
Please advise on the Referral.    **Mychart message sent to patient with list of names** These are the [facility] Providers for [place]:   Remo Lipps P. Havery Moros, MD Gastroenterology/Endoscopy   Thornton Park, MD, MPH Gastroenterology/Endoscopy   Gerrit Heck, DO Gastroenterology/Endoscopy   Wilfrid Lund, MD Gastroenterology/Endoscopy   Nicoletta Ba, PA-C Gastroenterology/Endoscopy   Silvano Rusk, MD Gastroenterology/Endoscopy   Azucena Freed, PA-C Gastroenterology/Endoscopy   Tye Savoy, ACNP-BC Gastroenterology/Endoscopy   Jackquline Denmark, MD Gastroenterology/Endoscopy   Owens Loffler, MD Gastroenterology/Endoscopy   Ellouise Newer, PA-C Gastroenterology/Endoscopy   Justice Britain, MD Gastroenterology/Endoscopy   Patrcia Dolly, MD Gastroenterology/Endoscopy   Scarlette Shorts, MD Gastroenterology/Endoscopy   Zenovia Jarred, MD Gastroenterology/Endoscopy   Alonza Bogus, PA-C Gastroenterology/Endoscopy   Lucio Edward, MD Gastroenterology/Endoscopy       These are the [place] Providers for :   Jonathon Bellows, MD Specialties Gastroenterology   Harless Nakayama, MD Specialties Gastroenterology   Varnita B. Bonna Gains, MD Specialties Gastroenterology   Rohini Raeanne Gathers, MD Specialties Gastroenterology   Lucilla Lame, MD Specialties Gastroenterology

## 2020-09-21 NOTE — Telephone Encounter (Signed)
Referral placed.

## 2020-09-21 NOTE — Telephone Encounter (Signed)
Pt said has had diarrhea on and off due to Trulicity; last episode of diarrhea on 09/19/20; pt cannot describe diarrhea except has no control over it and it is a mess.. Pt taking immodium on and off for months;Pt stopped Trulicity 2 - 3 months ago due to diarrhea. Pt has hx of diverticulitis; pt wants a [place] GI doctor. Pt said no vomiting and no covid symptoms; pt is requesting a referral to GI in [facility] network and pt also wants a list of names of GI doctors in [facility] network. Pt said she was seen about bowel issue and diabetes 06/29/20.Will send note to Dr Diona Browner for referral and [name] for list of [facility] doctors.

## 2020-10-12 ENCOUNTER — Ambulatory Visit: Payer: HMO | Admitting: Family Medicine

## 2020-10-13 DIAGNOSIS — E113313 Type 2 diabetes mellitus with moderate nonproliferative diabetic retinopathy with macular edema, bilateral: Secondary | ICD-10-CM | POA: Diagnosis not present

## 2020-10-20 ENCOUNTER — Telehealth: Payer: Self-pay

## 2020-10-20 NOTE — Progress Notes (Addendum)
Chronic Care Management Pharmacy Assistant   Name: Patricia Horne  MRN: [id number] DOB: 11-Mar-1950  Reason for Encounter: Diabetes Disease State   Recent office visits:  None since last CCM contact  Recent consult visits:  None since last CCM contact  Hospital visits:  None in previous 6 months  Medications: Outpatient Encounter Medications as of 10/20/2020  Medication Sig   acetaminophen (TYLENOL) 500 MG tablet Take 500 mg by mouth every 6 (six) hours as needed.   amLODipine (NORVASC) 5 MG tablet Take 1 tablet (5 mg total) by mouth in the morning and at bedtime.   Continuous Blood Gluc Receiver (FREESTYLE LIBRE 14 DAY READER) DEVI 1 EACH BY DOES NOT APPLY ROUTE 3 (THREE) TIMES DAILY AS NEEDED.   Continuous Blood Gluc Sensor (FREESTYLE LIBRE 14 DAY SENSOR) MISC 1 UNITS BY DOES NOT APPLY ROUTE EVERY 3 (THREE) DAYS AS NEEDED.   Dulaglutide (TRULICITY) 2.53 GU/4.4IH SOPN Inject 0.75 mg into the skin once a week.   gabapentin (NEURONTIN) 100 MG capsule Take 3 capsules (300 mg total) by mouth at bedtime.   glucose monitoring kit (FREESTYLE) monitoring kit 1 each by Does not apply route as needed for other.   Insulin Pen Needle (PEN NEEDLES) 31G X 8 MM MISC Pen needles for Trulicity, K74.259   loperamide (IMODIUM) 2 MG capsule Take by mouth as needed for diarrhea or loose stools.   losartan (COZAAR) 25 MG tablet Take 1 tablet (25 mg total) by mouth in the morning and at bedtime.   Vitamin D, Ergocalciferol, (DRISDOL) 1.25 MG (50000 UNIT) CAPS capsule Take 1 capsule by mouth once a week.   No facility-administered encounter medications on file as of 10/20/2020.     Recent Relevant Labs: Lab Results  Component Value Date/Time   HGBA1C 10.0 (H) 06/22/2020 08:14 AM   HGBA1C 7.3 (A) 03/26/2020 08:23 AM   HGBA1C 6.5 09/19/2019 09:10 AM   HGBA1C 13.9 06/05/2017 12:00 AM   MICROALBUR 1.9 04/13/2016 09:47 AM   MICROALBUR 1.0 12/07/2014 10:49 AM    Kidney Function Lab Results   Component Value Date/Time   CREATININE 0.93 06/22/2020 08:14 AM   CREATININE 0.87 09/19/2019 09:10 AM   CREATININE 0.61 09/30/2012 01:53 PM   CREATININE 0.68 09/21/2012 05:41 AM   GFR 62.58 06/22/2020 08:14 AM   GFRNONAA >60 09/30/2012 01:53 PM   GFRAA >60 09/30/2012 01:53 PM   Unsuccessful attempts to contact patient. Contacted patient on 10/20/2020, 10/21/2020 (spoke with patient and she asked me to call her later in afternoon - attempt in afternoon was unsuccessful) and 10/22/2020.   Current antihyperglycemic regimen:  Trulicity 5.63OV - inject 0.75 mg into skin once a week.  Adherence Review: Is the patient currently on a STATIN medication? No Is the patient currently on ACE/ARB medication? Yes Does the patient have >5 day gap between last estimated fill dates? Unable to verify with patient   Care Gaps: Annual wellness visit in last year?  09/23/2019 Most recent A1C reading: 10.0 06/22/2020 Most Recent BP reading: 172/94 on 06/29/2020  Last eye exam / retinopathy screening: 12/01/2014 Last diabetic foot exam: 06/14/2020  Counseled patient on importance of annual eye and foot exam.   Star Rating Drugs:  Medication:  Last Fill: Day Supply Losartan 53m 06/14/2020 30  No appointments scheduled within the next 30 days.  MDebbora Dus CPP notified  AMarijean Niemann RUtahClinical Pharmacy Assistant [phone] I have reviewed the care management and care coordination activities outlined in this encounter  and I am certifying that I agree with the content of this note. Multiple failed contact attempts - scheduled CCM 11/01/20.  [name], PharmD Clinical Pharmacist [facility] at [facility] [phone]

## 2020-11-01 ENCOUNTER — Telehealth: Payer: HMO

## 2020-11-01 ENCOUNTER — Telehealth: Payer: Self-pay

## 2020-11-01 NOTE — Telephone Encounter (Signed)
  Chronic Care Management   Outreach Note  11/01/2020 Name: Patricia Horne MRN: [id number] DOB: 1950/06/06  Referred by: Jinny Sanders, MD Reason for referral : Missed CCM Call   An unsuccessful telephone outreach was attempted today. The patient was referred to the pharmacist for assistance with care management and care coordination.  Patient's voicemail box is full [phone]). Unable to reach patient by work phone or cell phone for CCM pharmD appointment on 11/01/20 at 3 PM. Patient has not refilled medications since 5/22 for 30 day supply. Adherence concern.  [name], PharmD Clinical Pharmacist [facility] at [facility] [phone]

## 2020-11-04 ENCOUNTER — Telehealth: Payer: Self-pay

## 2020-11-04 NOTE — Progress Notes (Signed)
    Chronic Care Management Pharmacy Assistant   Name: Patricia Horne  MRN: [id number] DOB: Aug 15, 1950  Reason for Encounter: General Adherence   Recent office visits:  None since last CCM contact  Recent consult visits:  None since last CCM contact  Hospital visits:  None in previous 6 months  Medications: Outpatient Encounter Medications as of 11/04/2020  Medication Sig   acetaminophen (TYLENOL) 500 MG tablet Take 500 mg by mouth every 6 (six) hours as needed.   amLODipine (NORVASC) 5 MG tablet Take 1 tablet (5 mg total) by mouth in the morning and at bedtime.   Continuous Blood Gluc Receiver (FREESTYLE LIBRE 14 DAY READER) DEVI 1 EACH BY DOES NOT APPLY ROUTE 3 (THREE) TIMES DAILY AS NEEDED.   Continuous Blood Gluc Sensor (FREESTYLE LIBRE 14 DAY SENSOR) MISC 1 UNITS BY DOES NOT APPLY ROUTE EVERY 3 (THREE) DAYS AS NEEDED.   Dulaglutide (TRULICITY) 9.45 OP/9.2TW SOPN Inject 0.75 mg into the skin once a week.   gabapentin (NEURONTIN) 100 MG capsule Take 3 capsules (300 mg total) by mouth at bedtime.   glucose monitoring kit (FREESTYLE) monitoring kit 1 each by Does not apply route as needed for other.   Insulin Pen Needle (PEN NEEDLES) 31G X 8 MM MISC Pen needles for Trulicity, K46.286   loperamide (IMODIUM) 2 MG capsule Take by mouth as needed for diarrhea or loose stools.   losartan (COZAAR) 25 MG tablet Take 1 tablet (25 mg total) by mouth in the morning and at bedtime.   Vitamin D, Ergocalciferol, (DRISDOL) 1.25 MG (50000 UNIT) CAPS capsule Take 1 capsule by mouth once a week.   No facility-administered encounter medications on file as of 11/04/2020.   Unsuccessful attempt to contact [name] on 11/04/2020 for general disease state and medication adherence call. Patient has a telephone appointment with [name] on 11/08/2020 at 8:15 am.   Patient is > 5 days past due for refill on the following medications per chart history:  Star Medications: Medication  Name/mg Last Fill Days Supply Losartan 76m             06/14/2020      30 Last time chart shows Losartan was filled was by [facility] on 06/14/2020. Unable to contact patient to verify.   Care Gaps: Annual wellness visit in last year? No 09/23/2019 Most Recent BP reading: 172/94 on 06/29/2020  If Diabetic: Most recent A1C reading: 10.0 on 06/22/2020 Last eye exam / retinopathy screening: 12/01/2014 Last diabetic foot exam: 06/14/2020  No appointments scheduled within the next 30 days.  MDebbora Dus CPP notified  AMarijean Niemann RUtahClinical Pharmacy Assistant [phone]  Time Spent: 20 Minutes  Unsuccessful attempt to contact [facility] 11/04/2020 for general disease state and medication adherence call. Patient has a telephone appointment with MDebbora Duson 11/08/2020 at 8:15 am.

## 2020-11-08 ENCOUNTER — Telehealth: Payer: Self-pay

## 2020-11-08 ENCOUNTER — Ambulatory Visit (INDEPENDENT_AMBULATORY_CARE_PROVIDER_SITE_OTHER): Payer: HMO

## 2020-11-08 ENCOUNTER — Other Ambulatory Visit: Payer: Self-pay | Admitting: Family Medicine

## 2020-11-08 ENCOUNTER — Other Ambulatory Visit: Payer: Self-pay

## 2020-11-08 DIAGNOSIS — K582 Mixed irritable bowel syndrome: Secondary | ICD-10-CM

## 2020-11-08 DIAGNOSIS — E1149 Type 2 diabetes mellitus with other diabetic neurological complication: Secondary | ICD-10-CM

## 2020-11-08 DIAGNOSIS — E1159 Type 2 diabetes mellitus with other circulatory complications: Secondary | ICD-10-CM

## 2020-11-08 NOTE — Telephone Encounter (Signed)
Patricia Horne would like to know if [name] can give her a call about her GI referral.   [name], PharmD Clinical Pharmacist [facility] at [facility] [phone]

## 2020-11-08 NOTE — Telephone Encounter (Signed)
Patricia Horne would like to meet with someone one on one to discuss diet for diabetes. She would prefer to stay in [place] if possible.
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
## 2020-11-09 NOTE — Telephone Encounter (Signed)
Pt is requesting a referral to a [facility] physician. She has seen GI with [facility] about 3 years ago for a colonoscopy but she wants all of her physicians in the [facility] network now. She also only wants to go to [place]. She wants to see GI because she reports seh goes form constipation to diarrhea due to being a diabetic. Advised she needs an apt to discuss this and she said she could not drive to [place] to see PCP  because it is too far of a drive.  Advised PCP may want her to see one of her colleagues at [facility] if she could not make it to [place]. Advised a msg would be sent and this office would f/u. Pt verbalized understanding.

## 2020-11-09 NOTE — Telephone Encounter (Signed)
This is a chronic issue for her.. likely IBS. GI referral is appropriate.  No appt needed here.

## 2020-11-10 DIAGNOSIS — E113313 Type 2 diabetes mellitus with moderate nonproliferative diabetic retinopathy with macular edema, bilateral: Secondary | ICD-10-CM | POA: Diagnosis not present

## 2020-11-12 NOTE — Patient Instructions (Signed)
Dear Patricia Horne,  Below is a summary of the goals we discussed during our follow up appointment on November 08, 2020. Please contact me anytime with questions or concerns.   Visit Information  Patient Care Plan: CCM Pharmacy Care Plan     Problem Identified: CHL AMB "PATIENT-SPECIFIC PROBLEM"      Long-Range Goal: Disease Management   Start Date: 05/11/2020  This Visit's Progress: Not on track  Priority: High  Note:    Current Barriers:  Patient is not taking any of her medications except vitamin D  Pharmacist Clinical Goal(s):  Patient will adhere to prescribed medication regimen as evidenced by pt report and refill history through collaboration with PharmD and provider.   Interventions: 1:1 collaboration with Patricia Sanders, MD regarding development and update of comprehensive plan of care as evidenced by provider  and co-signature Inter-disciplinary care team collaboration (see longitudinal plan of care) Comprehensive medication review performed; medication list updated in electronic medical record  Hypertension (BP goal <140/90) -Uncontrolled, per clinic BP- Pt has not taken BP meds in several months  -Current treatment: Losartan 25 mg - 1 tablet twice daily Amlodipine 5 mg - 1 tablet twice daily  -Medications previously tried: BP meds are dosed twice daily to help with dizziness  -Current home readings: she has checked a couple times at work, 150/90 range -Denies hypotensive/hypertensive symptoms - She does report occasional headache.  -Educated on Importance of taking medications daily. She did not notice an improvement in adherence with pill packs.  -Recommended resume medications as prescribed. Pt declined taking medications. She is aware of risks, but would like to work on diet and lifestyle. She agrees to see a nutritionist.   Hyperlipidemia: (LDL goal < 100) -Controlled per LDL 97, but not on appropriate therapy -Not discussed 11/12/20 -Current  treatment: NONE -Medications previously tried: statin intolerant -Educated on Importance of limiting foods high in cholesterol; Exercise goal of 150 minutes per week; -Recommend increase exercise   Diabetes (A1c goal <7%) -Uncontrolled - A1c increased to 10% on last check (05/22) -Current medications: Trulicity 3.38 mg - Inject once weekly on Sundays  Freestyle Libre -Medications previously tried: Metformin, Jardiance  -Pt off Trulicity since beginning of 2022. She prefers injection over tablets but still some concern the Trulicity may be worsening her GI upset, diarrhea, gas. Minimal improvement noted off therapy, she has ongoing GI complaints. -Current home glucose readings - none (she has a Libre) fasting glucose: n/a post prandial glucose: n/a -Denies hypoglycemic/hyperglycemic symptoms -Educated on Eating smaller meals, low in fat and carbs while on Trulicity -Counseled to check feet daily and get yearly eye exams - up to date -Recommended resuming Trulicity weekly. Pt declines. States she her A1c has been as high as 12 before and she can get it down without medication. Agrees to see nutritionist.    Vit D Deficiency (Goal: Vit D WNL) -Unable to assess - vit D has not been updated since pt started high dose weekly  -Current treatment  Vitamin D3 50,000 IU - once weekly (pt resumed 2 weeks ago, due to back pain) -Medications previously tried: none -Pt  states she was off vitamin D for a while but restarted recently. -Recommended continue vitamin D3 weekly  Patient Goals/Self-Care Activities Patient will:  - focus on medication adherence by using adherence packaging through [facility]  Follow Up Plan:  - 30 days diabetes review (nutritionist follow up)      Patient verbalizes understanding of instructions provided today and agrees  to view in [place].   Patricia Horne, PharmD Clinical Pharmacist [facility] at [facility] [phone]

## 2020-11-12 NOTE — Telephone Encounter (Signed)
PCP placed referral.

## 2020-11-24 ENCOUNTER — Telehealth: Payer: Self-pay | Admitting: Family Medicine

## 2020-11-24 NOTE — Telephone Encounter (Signed)
Doreen Salvage case manager from [facility] call to leave her number for pt and PCP  [phone]

## 2020-11-29 ENCOUNTER — Telehealth: Payer: Self-pay | Admitting: *Deleted

## 2020-11-29 ENCOUNTER — Other Ambulatory Visit: Payer: Self-pay

## 2020-11-29 ENCOUNTER — Encounter: Payer: HMO | Attending: Family Medicine | Admitting: *Deleted

## 2020-11-29 ENCOUNTER — Encounter: Payer: Self-pay | Admitting: *Deleted

## 2020-11-29 VITALS — BP 130/96 | Ht 61.0 in | Wt 180.6 lb

## 2020-11-29 DIAGNOSIS — I152 Hypertension secondary to endocrine disorders: Secondary | ICD-10-CM | POA: Diagnosis not present

## 2020-11-29 DIAGNOSIS — E1149 Type 2 diabetes mellitus with other diabetic neurological complication: Secondary | ICD-10-CM | POA: Diagnosis not present

## 2020-11-29 DIAGNOSIS — E1159 Type 2 diabetes mellitus with other circulatory complications: Secondary | ICD-10-CM

## 2020-11-29 DIAGNOSIS — E1165 Type 2 diabetes mellitus with hyperglycemia: Secondary | ICD-10-CM

## 2020-11-29 NOTE — Telephone Encounter (Signed)
Please place future orders for lab appt.  

## 2020-11-29 NOTE — Patient Instructions (Signed)
Check blood sugars 2 x day before breakfast and 2 hrs after supper every day or as needed with FreeStyle Libre Bring blood sugar records to the next appointment  Exercise:  Begin walking for    10-15  minutes   3  days a week and gradually increase to 30 minutes 5 x week  Eat 3 meals day,   1-2  snacks a day Space meals 4-6 hours apart Don't skip meals Drink more water - at least 2-3 servings/day Avoid sugar sweetened drinks (juices)  Complete 3 Day Food Record and bring to next appt  Return for appointment on:  Friday January 14, 2021 at 11:00 am with [facility] (dietitian)

## 2020-11-30 ENCOUNTER — Telehealth: Payer: Self-pay | Admitting: Family Medicine

## 2020-11-30 ENCOUNTER — Other Ambulatory Visit: Payer: Self-pay | Admitting: Family Medicine

## 2020-11-30 ENCOUNTER — Other Ambulatory Visit (INDEPENDENT_AMBULATORY_CARE_PROVIDER_SITE_OTHER): Payer: HMO

## 2020-11-30 DIAGNOSIS — E1149 Type 2 diabetes mellitus with other diabetic neurological complication: Secondary | ICD-10-CM

## 2020-11-30 LAB — COMPREHENSIVE METABOLIC PANEL
ALT: 11 U/L (ref 0–35)
AST: 12 U/L (ref 0–37)
Albumin: 3.8 g/dL (ref 3.5–5.2)
Alkaline Phosphatase: 102 U/L (ref 39–117)
BUN: 23 mg/dL (ref 6–23)
CO2: 29 mEq/L (ref 19–32)
Calcium: 9.2 mg/dL (ref 8.4–10.5)
Chloride: 103 mEq/L (ref 96–112)
Creatinine, Ser: 0.88 mg/dL (ref 0.40–1.20)
GFR: 66.66 mL/min (ref >60.00)
Glucose, Bld: 234 mg/dL — ABNORMAL HIGH (ref 70–99)
Potassium: 4.2 mEq/L (ref 3.5–5.1)
Sodium: 139 mEq/L (ref 135–145)
Total Bilirubin: 0.5 mg/dL (ref 0.2–1.2)
Total Protein: 7 g/dL (ref 6.0–8.3)

## 2020-11-30 LAB — LIPID PANEL
Cholesterol: 176 mg/dL (ref 0–200)
HDL: 49.4 mg/dL (ref >39.00)
LDL Cholesterol: 93 mg/dL (ref 0–99)
NonHDL: 126.16
Total CHOL/HDL Ratio: 4
Triglycerides: 168 mg/dL — ABNORMAL HIGH (ref 0.0–149.0)
VLDL: 33.6 mg/dL (ref 0.0–40.0)

## 2020-11-30 LAB — HEMOGLOBIN A1C: Hgb A1c MFr Bld: 9.6 % — ABNORMAL HIGH (ref 4.6–6.5)

## 2020-11-30 NOTE — Progress Notes (Signed)
Diabetes Self-Management Education  Visit Type: First/Initial  Appt. Start Time: 1605 Appt. End Time: 2025  11/29/2020  [name], identified by name and date of birth, is a 70 y.o. female with a diagnosis of Diabetes: Type 2.   ASSESSMENT  Blood pressure (!) 130/96, height 5\' 1"  (1.549 m), weight 180 lb 9.6 oz (81.9 kg). Body mass index is 34.12 kg/m.   Diabetes Self-Management Education - 11/29/20 1717       Visit Information   Visit Type First/Initial      Initial Visit   Diabetes Type Type 2    Are you currently following a meal plan? No    Are you taking your medications as prescribed? No   Patient has not taken Patricia Horne medications in the last month.   Date Diagnosed 20 yrs or more      Health Coping   How would you rate your overall health? Fair      Psychosocial Assessment   Patient Belief/Attitude about Diabetes Other (comment)   "I know what Diabetes is - take each day as it is"   Self-care barriers None    Self-management support Doctor's office;Family    Patient Concerns Nutrition/Meal planning;Medication;Monitoring;Healthy Lifestyle;Problem Solving;Glycemic Control;Weight Control    Special Needs None    Preferred Learning Style Hands on    Learning Readiness Contemplating    How often do you need to have someone help you when you read instructions, pamphlets, or other written materials from your doctor or pharmacy? 1 - Never    What is the last grade level you completed in school? 12th      Pre-Education Assessment   Patient understands the diabetes disease and treatment process. Needs Review    Patient understands incorporating nutritional management into lifestyle. Needs Instruction    Patient undertands incorporating physical activity into lifestyle. Needs Instruction    Patient understands using medications safely. Needs Instruction    Patient understands monitoring blood glucose, interpreting and using results Needs Review    Patient understands  prevention, detection, and treatment of acute complications. Needs Instruction    Patient understands prevention, detection, and treatment of chronic complications. Needs Review    Patient understands how to develop strategies to address psychosocial issues. Needs Instruction    Patient understands how to develop strategies to promote health/change behavior. Needs Instruction      Complications   Last HgB A1C per patient/outside source 10 %   06/22/2020   How often do you check your blood sugar? 0 times/day (not testing)   Patricia Horne hasn't checked Patricia Horne blood sugar in 2 months. Patricia Horne reports having blood drawn tomorrow for Patricia Horne A1C.   Have you had a dilated eye exam in the past 12 months? Yes    Have you had a dental exam in the past 12 months? Yes    Are you checking your feet? Yes    How many days per week are you checking your feet? 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 sometimes eats before Patricia Horne goes to bed and sometimes Patricia Horne doesn't - biscuit with bacon, egg and cheese, sometimes a hashbrown    Lunch reports Brunch when Patricia Horne wakes up - same as breakfast meal    Snack (afternoon) popcorn, fruit if working (apple, orange, grapes, fruit with lite syrup, banana), [place] yogurt    Dinner 4-5 pm beef, pork, fish, potatoes, green beans, peas, corn, rice, pasta, bread, kale, greens, spinach, squash,  salad (lettuce, tomatoes, cuccumbers, onions, eggs)    Snack (evening) 11:30 pm-1:00 am 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, 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    Beverage(s) coffee, Crystal Light, milk      Exercise   Exercise Type ADL's      Patient Education   Previous Diabetes Education Yes (please comment)   here at [place] [facility]  Factors that contribute to the development of diabetes;Explored patient's options for treatment of their diabetes    Nutrition management  Role of diet in the treatment of diabetes and the relationship between the three main macronutrients and blood glucose level;Food  label reading, portion sizes and measuring food.;Reviewed blood glucose goals for pre and post meals and how to evaluate the patients' food intake on their blood glucose level.    Physical activity and exercise  Role of exercise on diabetes management, blood pressure control and cardiac health.    Medications Reviewed patients medication for diabetes, action, purpose, timing of dose and side effects.    Monitoring Purpose and frequency of SMBG.;Taught/discussed recording of test results and interpretation of SMBG.;Identified appropriate SMBG and/or A1C goals.;Other (comment)   Encouraged patient to reapply FreeStyle Libre CGM   Chronic complications Relationship between chronic complications and blood glucose control    Psychosocial adjustment Identified and addressed patients feelings and concerns about diabetes      Individualized Goals (developed by patient)   Reducing Risk Other (comment)   improve blood sugars, decrease medications, prevent diabetes complications, lose weight, lead a healthier lifestyle, become more fit     Outcomes   Expected Outcomes Demonstrated interest in learning. Expect positive outcomes    Future DMSE 4-6 wks        Individualized Plan for Diabetes Self-Management Training:   Learning Objective:  Patient will have a greater understanding of diabetes self-management. Patient education plan is to attend individual and/or group sessions per assessed needs and concerns.   Plan:   Patient Instructions  Check blood sugars 2 x day before breakfast and 2 hrs after supper every day or as needed with FreeStyle Libre Bring blood sugar records to the next appointment  Exercise:  Begin walking for    10-15  minutes   3  days a week and gradually increase to 30 minutes 5 x week  Eat 3 meals day,   1-2  snacks a day Space meals 4-6 hours apart Don't skip meals Drink more water - at least 2-3 servings/day Avoid sugar sweetened drinks (juices)  Complete 3 Day Food  Record and bring to next appt  Return for appointment on:  Friday January 14, 2021 at 11:00 am with [facility] (dietitian)  Expected Outcomes:  Demonstrated interest in learning. Expect positive outcomes  Education material provided:  General Meal Planning Guidelines Simple Meal Plan 3 Day Food Record Quick and Balanced Meals  If problems or questions, patient to contact team via:   Johny Drilling, RN, [place] [phone]  Future DSME appointment: 4-6 wks January 14, 2021 with the dietitian

## 2020-11-30 NOTE — Telephone Encounter (Signed)
Patient has a lab appt today 11/30/2020, there are no orders in.

## 2020-12-03 ENCOUNTER — Emergency Department
Admission: EM | Admit: 2020-12-03 | Discharge: 2020-12-03 | Disposition: A | Discharge status: Home / Self Care | Payer: HMO | Attending: Emergency Medicine | Admitting: Emergency Medicine

## 2020-12-03 ENCOUNTER — Other Ambulatory Visit: Payer: Self-pay

## 2020-12-03 ENCOUNTER — Encounter: Payer: Self-pay | Admitting: Emergency Medicine

## 2020-12-03 DIAGNOSIS — H6121 Impacted cerumen, right ear: Secondary | ICD-10-CM | POA: Diagnosis not present

## 2020-12-03 DIAGNOSIS — E1140 Type 2 diabetes mellitus with diabetic neuropathy, unspecified: Secondary | ICD-10-CM | POA: Insufficient documentation

## 2020-12-03 DIAGNOSIS — I10 Essential (primary) hypertension: Secondary | ICD-10-CM | POA: Diagnosis not present

## 2020-12-03 DIAGNOSIS — Z79899 Other long term (current) drug therapy: Secondary | ICD-10-CM | POA: Diagnosis not present

## 2020-12-03 DIAGNOSIS — H9201 Otalgia, right ear: Secondary | ICD-10-CM | POA: Diagnosis present

## 2020-12-03 MED ORDER — CARBAMIDE PEROXIDE 6.5 % OT SOLN: 5.0000 [drp] | Freq: Two times a day (BID) | OTIC | 0 refills | Status: AC

## 2020-12-03 MED ORDER — DOCUSATE SODIUM 50 MG/5ML PO LIQD
50.0000 mg | Freq: Once | ORAL | Status: AC
  Administered 2020-12-03: 50 mg via OTIC
  Filled 2020-12-03: qty 10

## 2020-12-03 NOTE — ED Notes (Signed)
LLOW UP ENT INFO PROVIDED RX SENT TO PHARM ALL INFO PROVIDED ALL QUESTIONS ANSWERED
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
## 2020-12-03 NOTE — ED Triage Notes (Signed)
Pt here with c/o being unable to hear in right ear since yesterday, "like i'm talking in a tunnel." Nad.

## 2020-12-06 ENCOUNTER — Telehealth: Payer: Self-pay

## 2020-12-06 ENCOUNTER — Ambulatory Visit (INDEPENDENT_AMBULATORY_CARE_PROVIDER_SITE_OTHER): Payer: HMO | Admitting: Family Medicine

## 2020-12-06 ENCOUNTER — Other Ambulatory Visit: Payer: Self-pay

## 2020-12-06 VITALS — BP 118/70 | HR 84 | Temp 97.0°F | Ht 61.0 in | Wt 179.5 lb

## 2020-12-06 DIAGNOSIS — Z20822 Contact with and (suspected) exposure to covid-19: Secondary | ICD-10-CM | POA: Diagnosis not present

## 2020-12-06 DIAGNOSIS — R051 Acute cough: Secondary | ICD-10-CM | POA: Diagnosis not present

## 2020-12-06 DIAGNOSIS — U071 COVID-19: Secondary | ICD-10-CM

## 2020-12-06 DIAGNOSIS — H6121 Impacted cerumen, right ear: Secondary | ICD-10-CM | POA: Diagnosis not present

## 2020-12-06 LAB — POC COVID19 BINAXNOW: SARS Coronavirus 2 Ag: POSITIVE — AB

## 2020-12-06 LAB — POCT INFLUENZA A/B
Influenza A, POC: NEGATIVE
Influenza B, POC: NEGATIVE

## 2020-12-06 MED ORDER — BENZONATATE 100 MG PO CAPS: 100.0000 mg | ORAL_CAPSULE | Freq: Three times a day (TID) | ORAL | 0 refills | Status: DC | PRN

## 2020-12-06 MED ORDER — NIRMATRELVIR/RITONAVIR (PAXLOVID)TABLET: 3.0000 | ORAL_TABLET | Freq: Two times a day (BID) | ORAL | 0 refills | Status: AC

## 2020-12-06 NOTE — Patient Instructions (Addendum)
Covid positive  Isolate - 5 days  - Can wear mask in public starting 68/25/7493  Full isolation 10 days - Can stop masking if symptoms improving 12/15/2020  Paxlovid for 5 days  ER if - worsening breathing, chest pain, fatigue  Ear wax - return in 7-10 days if your fullness is not better

## 2020-12-06 NOTE — Chronic Care Management (AMB) (Addendum)
Chronic Care Management Pharmacy Assistant   Name: Patricia Horne  MRN: [id number] DOB: Apr 10, 1950    Reason for Encounter: Diabetes Disease State   Recent office visits:  12/07/20-PCP-Patient presented for covid positive and follow up diabetes.HTN well controlled at [place] yesterday but per report today she is not taking any of her medications,including BP meds.Restart medication if BP > 140/90 after feeling better.restart Trulicity as worked well for her.Referral in place, please call GI to set up: [facility]  [phone]. 12/06/20-Family Medicine-Patient presented for Covid-positive-Paxlovid for 5 days,isolation guidelines.  Recent consult visits:  11/29/20-[facility]-Patient presented for initial evaluation for self management diabetes education.    Hospital visits:  12/03/20-[facility] ED-Patient presented for impacted cerumen right ear.Dissolved the cerumen with liquid Colace and then irrigated the ear.Discharged her with a prescription for Debrox and ENT referral.No admission  Medications: Outpatient Encounter Medications as of 12/06/2020  Medication Sig   acetaminophen (TYLENOL) 500 MG tablet Take 500 mg by mouth every 6 (six) hours as needed.   amLODipine (NORVASC) 5 MG tablet Take 1 tablet (5 mg total) by mouth in the morning and at bedtime. (Patient not taking: Reported on 11/08/2020)   benzonatate (TESSALON PERLES) 100 MG capsule Take 1 capsule (100 mg total) by mouth 3 (three) times daily as needed.   carbamide peroxide (DEBROX) 6.5 % OTIC solution Place 5 drops into the right ear 2 (two) times daily for 4 days. (Patient not taking: Reported on 12/06/2020)   Continuous Blood Gluc Receiver (FREESTYLE LIBRE 14 DAY READER) DEVI 1 EACH BY DOES NOT APPLY ROUTE 3 (THREE) TIMES DAILY AS NEEDED. (Patient not taking: No sig reported)   Continuous Blood Gluc Horne (FREESTYLE LIBRE 14 DAY Horne) MISC 1 UNITS BY DOES NOT APPLY ROUTE EVERY 3 (THREE) DAYS AS NEEDED.  (Patient not taking: No sig reported)   Dulaglutide (TRULICITY) 9.47 ML/4.6TK SOPN Inject 0.75 mg into the skin once a week. (Patient not taking: No sig reported)   gabapentin (NEURONTIN) 100 MG capsule Take 3 capsules (300 mg total) by mouth at bedtime. (Patient not taking: No sig reported)   glucose monitoring kit (FREESTYLE) monitoring kit 1 each by Does not apply route as needed for other. (Patient not taking: No sig reported)   Insulin Pen Needle (PEN NEEDLES) 31G X 8 MM MISC Pen needles for Trulicity, P54.656 (Patient not taking: No sig reported)   ketorolac (ACULAR) 0.5 % ophthalmic solution Place 1 drop into the left eye 4 (four) times daily as needed.   loperamide (IMODIUM) 2 MG capsule Take by mouth as needed for diarrhea or loose stools.   losartan (COZAAR) 25 MG tablet Take 1 tablet (25 mg total) by mouth in the morning and at bedtime. (Patient not taking: No sig reported)   nirmatrelvir/ritonavir EUA (PAXLOVID) 20 x 150 MG & 10 x 100MG TABS Take 3 tablets by mouth 2 (two) times daily for 5 days. (Take nirmatrelvir 150 mg two tablets twice daily for 5 days and ritonavir 100 mg one tablet twice daily for 5 days) Patient GFR is 66   Vitamin D, Ergocalciferol, (DRISDOL) 1.25 MG (50000 UNIT) CAPS capsule Take 1 capsule by mouth once a week.   No facility-administered encounter medications on file as of 12/06/2020.     Recent Relevant Labs: Lab Results  Component Value Date/Time   HGBA1C 9.6 (H) 11/30/2020 10:29 AM   HGBA1C 10.0 (H) 06/22/2020 08:14 AM   HGBA1C 13.9 06/05/2017 12:00 AM   MICROALBUR 1.9 04/13/2016  09:47 AM   MICROALBUR 1.0 12/07/2014 10:49 AM    Kidney Function Lab Results  Component Value Date/Time   CREATININE 0.88 11/30/2020 10:29 AM   CREATININE 0.93 06/22/2020 08:14 AM   CREATININE 0.61 09/30/2012 01:53 PM   CREATININE 0.68 09/21/2012 05:41 AM   GFR 66.66 11/30/2020 10:29 AM   GFRNONAA >60 09/30/2012 01:53 PM   GFRAA >60 09/30/2012 01:53 PM      Attempted contact with [place] 3 times on 12/08/20,12/13/20,12/15/20. Unsuccessful outreach. Will attempt contact next month.   Current antihyperglycemic regimen:  Trulicity 5.49 mg - Inject once weekly on Sundays  Freestyle Libre   Adherence Review: Is the patient currently on a STATIN medication? No Is the patient currently on ACE/ARB medication? Yes Does the patient have >5 day gap between last estimated fill dates? Yes  Care Gaps: Annual wellness visit in last year? No Most recent A1C reading:9.6  11/30/20 Most Recent BP reading:118/70  84-P 12/06/20  Last eye exam / retinopathy screening:03/24/20 Last diabetic foot exam:06/14/20  Star Rating Drugs:  Medication:  Last Fill: Day Supply Losartan 82ME 1/58/30 Trulicity   PCP appointment on 12/06/20-Family Medicine   01/14/21- [facility] (Nutritionist)  Patricia Horne, CPP notified  Patricia Horne, [place] Assistant [phone]  I have reviewed the care management and care coordination activities outlined in this encounter and I am certifying that I agree with the content of this note. No further action required.  Patricia Horne, PharmD Clinical Pharmacist [facility] [phone]

## 2020-12-06 NOTE — Progress Notes (Signed)
Subjective:     Patricia Horne is a 70 y.o. female presenting for Ear Fullness (R ear ), Cough, Nasal Congestion, and Headache     Ear Fullness  Associated symptoms include coughing, headaches and rhinorrhea. Pertinent negatives include no sore throat.  Cough Episode onset: 12/04/2020. The cough is Non-productive. Associated symptoms include chills, headaches and rhinorrhea. Pertinent negatives include no fever, nasal congestion, sore throat or shortness of breath. She has tried prescription cough suppressant for the symptoms.  Headache  Associated symptoms include coughing and rhinorrhea. Pertinent negatives include no fever or sore throat.   Went to the ER on 11/4 - wax build-up did colace and got some out Went to work that night  Works at the [employer] alone  No known sick contact  Got diarrhea - due to medication Stopped all her medication 8 months ago - went to diabetic counselor again - is working on diet for diabetes     Review of Systems  Constitutional:  Positive for chills. Negative for fever.  HENT:  Positive for rhinorrhea. Negative for sore throat.   Respiratory:  Positive for cough. Negative for shortness of breath.   Neurological:  Positive for headaches.    Social History   Tobacco Use  Smoking Status Never  Smokeless Tobacco Never        Objective:    BP Readings from Last 3 Encounters:  12/06/20 118/70  12/03/20 (!) 187/81  11/29/20 (!) 130/96   Wt Readings from Last 3 Encounters:  12/06/20 179 lb 8 oz (81.4 kg)  12/03/20 180 lb (81.6 kg)  11/29/20 180 lb 9.6 oz (81.9 kg)    BP 118/70   Pulse 84   Temp (!) 97 F (36.1 C) (Temporal)   Ht 5\' 1"  (1.549 m)   Wt 179 lb 8 oz (81.4 kg)   SpO2 96%   BMI 33.92 kg/m    Physical Exam Constitutional:      General: She is not in acute distress.    Appearance: She is well-developed. She is not diaphoretic.  HENT:     Head: Normocephalic and atraumatic.     Right Ear: Ear canal normal.  There is impacted cerumen.     Left Ear: Tympanic membrane and ear canal normal.     Nose: Mucosal edema and rhinorrhea present.     Right Sinus: No maxillary sinus tenderness or frontal sinus tenderness.     Left Sinus: No maxillary sinus tenderness or frontal sinus tenderness.     Mouth/Throat:     Pharynx: Uvula midline. Posterior oropharyngeal erythema present. No oropharyngeal exudate.     Tonsils: 0 on the right. 0 on the left.  Eyes:     General: No scleral icterus.    Conjunctiva/sclera: Conjunctivae normal.  Cardiovascular:     Rate and Rhythm: Normal rate and regular rhythm.     Heart sounds: Normal heart sounds. No murmur heard. Pulmonary:     Effort: Pulmonary effort is normal. No respiratory distress.     Breath sounds: Normal breath sounds.  Musculoskeletal:     Cervical back: Neck supple.  Lymphadenopathy:     Cervical: No cervical adenopathy.  Skin:    General: Skin is warm and dry.     Capillary Refill: Capillary refill takes less than 2 seconds.  Neurological:     Mental Status: She is alert.   Rapid Covid: positive Rapid Flu: negative       Assessment & Plan:   Problem List  Items Addressed This Visit   None Visit Diagnoses     COVID-19 virus infection    -  Primary   Relevant Medications   nirmatrelvir/ritonavir EUA (PAXLOVID) 20 x 150 MG & 10 x 100MG  TABS   benzonatate (TESSALON PERLES) 100 MG capsule   Impacted cerumen of right ear       Suspected COVID-19 virus infection       Relevant Orders   POC COVID-19   Acute cough       Relevant Orders   POCT Influenza A/B      Cerumen impaction - as rapid covid was positive discussed avoiding prolonged exposure of any staff member. Drops were applied and she will return after isolation if persisting fullness for removal.   Patient is at increased risk for developing severe covid due to HTN, DM, obesity, and age. They are eligible for anti-viral medication.   Discussed Paxlovid and they would like  to start. Normal liver and kidney function.   Lab Results  Component Value Date   ALT 11 11/30/2020   AST 12 11/30/2020   ALKPHOS 102 11/30/2020   BILITOT 0.5 11/30/2020    Lab Results  Component Value Date   CREATININE 0.88 11/30/2020     Medication sent to pharmacy  Reviewed ER and return precautions  Reviewed isolation guidelines.     Return if symptoms worsen or fail to improve.  Lesleigh Noe, MD  This visit occurred during the SARS-CoV-2 public health emergency.  Safety protocols were in place, including screening questions prior to the visit, additional usage of staff PPE, and extensive cleaning of exam room while observing appropriate contact time as indicated for disinfecting solutions.

## 2020-12-07 ENCOUNTER — Encounter: Payer: Self-pay | Admitting: Family Medicine

## 2020-12-07 ENCOUNTER — Telehealth (INDEPENDENT_AMBULATORY_CARE_PROVIDER_SITE_OTHER): Payer: HMO | Admitting: Family Medicine

## 2020-12-07 DIAGNOSIS — I152 Hypertension secondary to endocrine disorders: Secondary | ICD-10-CM | POA: Diagnosis not present

## 2020-12-07 DIAGNOSIS — E1149 Type 2 diabetes mellitus with other diabetic neurological complication: Secondary | ICD-10-CM | POA: Diagnosis not present

## 2020-12-07 DIAGNOSIS — E1159 Type 2 diabetes mellitus with other circulatory complications: Secondary | ICD-10-CM | POA: Diagnosis not present

## 2020-12-07 DIAGNOSIS — U071 COVID-19: Secondary | ICD-10-CM | POA: Diagnosis not present

## 2020-12-07 NOTE — Assessment & Plan Note (Signed)
Poor control, pt noncompliant with meds.  restart Trulicity as worked well for her.

## 2020-12-07 NOTE — Assessment & Plan Note (Signed)
No red flags.  Continue antiviral  Encouraged  Following CBGs while ill.  Keep up po intake.

## 2020-12-07 NOTE — Patient Instructions (Addendum)
Restart Trulicity. Referral in place, please call GI to set up: [facility]  [phone].  Call if they state they do not see referral in place.

## 2020-12-07 NOTE — Assessment & Plan Note (Addendum)
Restart medication if BP > 140/90 after feeling better.  Amlodipine 5 mg Losartan 25 mg daily

## 2020-12-07 NOTE — Progress Notes (Signed)
Patient ID: Patricia Horne, female    DOB: 13-Apr-1950, 70 y.o.   MRN: [id number]  This visit was conducted in person.  Ht 5' 1"  (1.549 m)   BMI 33.92 kg/m    CC: Chief Complaint  Patient presents with   Diabetes   Covid Positive    Seen by Einar Pheasant 12/06/20    Subjective:   HPI: Patricia Horne is a 70 y.o. female presenting on 12/07/2020 for Diabetes and Covid Positive (Seen by Einar Pheasant 12/06/20)     Diabetes:  She has stopped  Trulicity given bowel issues. Lab Results  Component Value Date   HGBA1C 9.6 (H) 11/30/2020  Using medications without difficulties: Hypoglycemic episodes: Hyperglycemic episodes: Feet problems: Blood Sugars averaging: She has not been checking FBS eye exam within last year: done   COVID infection: Date of Onset 12/04/20  Reviewed OV note Dr. Einar Pheasant 12/06/2020 now on antiviral.  She is feeling unchanged.. no SOB, no wheeze, no fever.  Trying to keep[ up with liquids.   Seen at ED on 12/03/2020 for  cerumen impaction.  HTN well controlled at [place] yesterday but per report today she is not taking any of her medications.. including BP meds BP Readings from Last 3 Encounters:  12/06/20 118/70  12/03/20 (!) 187/81  11/29/20 (!) 130/96   Elevated Cholesterol:  LDL at goal < 100  but statin indicated Lab Results  Component Value Date   CHOL 176 11/30/2020   HDL 49.40 11/30/2020   LDLCALC 93 11/30/2020   TRIG 168.0 (H) 11/30/2020   CHOLHDL 4 11/30/2020  Using medications without problems: Muscle aches:  Diet compliance: moderate Exercise:none Other complaints:   Relevant past medical, surgical, family and social history reviewed and updated as indicated. Interim medical history since our last visit reviewed. Allergies and medications reviewed and updated. Outpatient Medications Prior to Visit  Medication Sig Dispense Refill   acetaminophen (TYLENOL) 500 MG tablet Take 500 mg by mouth every 6 (six) hours as needed.     benzonatate (TESSALON  PERLES) 100 MG capsule Take 1 capsule (100 mg total) by mouth 3 (three) times daily as needed. 20 capsule 0   ketorolac (ACULAR) 0.5 % ophthalmic solution Place 1 drop into the left eye 4 (four) times daily as needed.     nirmatrelvir/ritonavir EUA (PAXLOVID) 20 x 150 MG & 10 x 100MG TABS Take 3 tablets by mouth 2 (two) times daily for 5 days. (Take nirmatrelvir 150 mg two tablets twice daily for 5 days and ritonavir 100 mg one tablet twice daily for 5 days) Patient GFR is 66 30 tablet 0   Vitamin D, Ergocalciferol, (DRISDOL) 1.25 MG (50000 UNIT) CAPS capsule Take 1 capsule by mouth once a week.     amLODipine (NORVASC) 5 MG tablet Take 1 tablet (5 mg total) by mouth in the morning and at bedtime. (Patient not taking: No sig reported) 60 tablet 5   carbamide peroxide (DEBROX) 6.5 % OTIC solution Place 5 drops into the right ear 2 (two) times daily for 4 days. (Patient not taking: No sig reported) 15 mL 0   Continuous Blood Gluc Receiver (FREESTYLE LIBRE 14 DAY READER) DEVI 1 EACH BY DOES NOT APPLY ROUTE 3 (THREE) TIMES DAILY AS NEEDED. (Patient not taking: No sig reported) 1 each 0   Continuous Blood Gluc Sensor (FREESTYLE LIBRE 14 DAY SENSOR) MISC 1 UNITS BY DOES NOT APPLY ROUTE EVERY 3 (THREE) DAYS AS NEEDED. (Patient not taking: No sig reported) 4 each  3   Dulaglutide (TRULICITY) 7.00 FV/4.9SW SOPN Inject 0.75 mg into the skin once a week. (Patient not taking: No sig reported) 9 mL 3   gabapentin (NEURONTIN) 100 MG capsule Take 3 capsules (300 mg total) by mouth at bedtime. (Patient not taking: No sig reported) 270 capsule 0   glucose monitoring kit (FREESTYLE) monitoring kit 1 each by Does not apply route as needed for other. (Patient not taking: No sig reported) 1 each 0   Insulin Pen Needle (PEN NEEDLES) 31G X 8 MM MISC Pen needles for Trulicity, H67.591 (Patient not taking: No sig reported) 50 each 11   loperamide (IMODIUM) 2 MG capsule Take by mouth as needed for diarrhea or loose stools.  (Patient not taking: Reported on 12/07/2020)     losartan (COZAAR) 25 MG tablet Take 1 tablet (25 mg total) by mouth in the morning and at bedtime. (Patient not taking: No sig reported) 60 tablet 5   No facility-administered medications prior to visit.     Per HPI unless specifically indicated in ROS section below Review of Systems  Constitutional:  Negative for fatigue and fever.  HENT:  Positive for congestion.   Eyes:  Negative for pain.  Respiratory:  Positive for cough. Negative for shortness of breath.   Cardiovascular:  Negative for chest pain, palpitations and leg swelling.  Gastrointestinal:  Positive for constipation and diarrhea. Negative for abdominal pain.  Genitourinary:  Negative for dysuria and vaginal bleeding.  Musculoskeletal:  Negative for back pain.  Neurological:  Negative for syncope, light-headedness and headaches.  Psychiatric/Behavioral:  Negative for dysphoric mood.   Objective:  Ht 5' 1"  (1.549 m)   BMI 33.92 kg/m   Wt Readings from Last 3 Encounters:  12/06/20 179 lb 8 oz (81.4 kg)  12/03/20 180 lb (81.6 kg)  11/29/20 180 lb 9.6 oz (81.9 kg)      Physical Exam    Results for orders placed or performed in visit on 12/06/20  POC COVID-19  Result Value Ref Range   SARS Coronavirus 2 Ag Positive (A) Negative  POCT Influenza A/B  Result Value Ref Range   Influenza A, POC Negative Negative   Influenza B, POC Negative Negative    This visit occurred during the SARS-CoV-2 public health emergency.  Safety protocols were in place, including screening questions prior to the visit, additional usage of staff PPE, and extensive cleaning of exam room while observing appropriate contact time as indicated for disinfecting solutions.   COVID 19 screen:  No recent travel or known exposure to COVID19 The patient denies respiratory symptoms of COVID 19 at this time. The importance of social distancing was discussed today.   Assessment and Plan    Problem List  Items Addressed This Visit     COVID-19 virus infection    No red flags.  Continue antiviral  Encouraged  Following CBGs while ill.  Keep up po intake.      Hypertension associated with diabetes ([place])    Restart medication if BP > 140/90 after feeling better.  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  restart Trulicity as worked well for her.        Eliezer Lofts, MD

## 2020-12-29 DIAGNOSIS — E113313 Type 2 diabetes mellitus with moderate nonproliferative diabetic retinopathy with macular edema, bilateral: Secondary | ICD-10-CM | POA: Diagnosis not present

## 2021-01-12 ENCOUNTER — Telehealth: Payer: Self-pay | Admitting: Family Medicine

## 2021-01-12 NOTE — Telephone Encounter (Signed)
Spoke with Patricia Horne.  She states she is having low back pain w/o sciatica.  She also states she still can't hear out of her right ear.  She states she can't drive all the way out to [place].  Appointment schedule with Eugenia Pancoast on 01/14/2021 at 12:20 pm.  I will print out handicap placard application for Dr. Diona Browner to complete and mail to patient per her request. FYI to [place].

## 2021-01-12 NOTE — Telephone Encounter (Signed)
Noted  

## 2021-01-12 NOTE — Telephone Encounter (Signed)
Pt called stating that she would like for Patricia Horne to give her a call back to discuss some issues she is having. Pt states that she would like to talk to you about her handicap stickers and lower back pain. Please advise.

## 2021-01-14 ENCOUNTER — Telehealth: Payer: Self-pay | Admitting: Family

## 2021-01-14 ENCOUNTER — Ambulatory Visit (INDEPENDENT_AMBULATORY_CARE_PROVIDER_SITE_OTHER): Payer: HMO | Admitting: Family

## 2021-01-14 ENCOUNTER — Other Ambulatory Visit: Payer: Self-pay

## 2021-01-14 ENCOUNTER — Encounter: Payer: Self-pay | Admitting: Family

## 2021-01-14 ENCOUNTER — Ambulatory Visit (INDEPENDENT_AMBULATORY_CARE_PROVIDER_SITE_OTHER): Payer: HMO

## 2021-01-14 ENCOUNTER — Ambulatory Visit: Payer: HMO | Admitting: Dietician

## 2021-01-14 VITALS — BP 158/85 | HR 90 | Temp 97.8°F | Ht 61.0 in | Wt 176.0 lb

## 2021-01-14 DIAGNOSIS — E1140 Type 2 diabetes mellitus with diabetic neuropathy, unspecified: Secondary | ICD-10-CM

## 2021-01-14 DIAGNOSIS — I152 Hypertension secondary to endocrine disorders: Secondary | ICD-10-CM | POA: Diagnosis not present

## 2021-01-14 DIAGNOSIS — E1159 Type 2 diabetes mellitus with other circulatory complications: Secondary | ICD-10-CM | POA: Diagnosis not present

## 2021-01-14 DIAGNOSIS — M545 Low back pain, unspecified: Secondary | ICD-10-CM | POA: Diagnosis not present

## 2021-01-14 DIAGNOSIS — H9191 Unspecified hearing loss, right ear: Secondary | ICD-10-CM | POA: Diagnosis not present

## 2021-01-14 DIAGNOSIS — R319 Hematuria, unspecified: Secondary | ICD-10-CM

## 2021-01-14 DIAGNOSIS — G8929 Other chronic pain: Secondary | ICD-10-CM | POA: Insufficient documentation

## 2021-01-14 DIAGNOSIS — R809 Proteinuria, unspecified: Secondary | ICD-10-CM

## 2021-01-14 DIAGNOSIS — Z9114 Patient's other noncompliance with medication regimen: Secondary | ICD-10-CM

## 2021-01-14 DIAGNOSIS — M5416 Radiculopathy, lumbar region: Secondary | ICD-10-CM

## 2021-01-14 DIAGNOSIS — E1149 Type 2 diabetes mellitus with other diabetic neurological complication: Secondary | ICD-10-CM | POA: Diagnosis not present

## 2021-01-14 DIAGNOSIS — N3001 Acute cystitis with hematuria: Secondary | ICD-10-CM

## 2021-01-14 LAB — URINALYSIS, ROUTINE W REFLEX MICROSCOPIC
Bilirubin Urine: NEGATIVE
Leukocytes,Ua: NEGATIVE
Nitrite: NEGATIVE
Specific Gravity, Urine: >=1.03 — AB (ref 1.000–1.030)
Total Protein, Urine: 100 — AB
Urine Glucose: NEGATIVE
Urobilinogen, UA: 1 (ref 0.0–1.0)
pH: 5 (ref 5.0–8.0)

## 2021-01-14 MED ORDER — GABAPENTIN 100 MG PO CAPS
100.0000 mg | ORAL_CAPSULE | Freq: Every day | ORAL | 0 refills | Status: DC
  Filled 2021-01-14: qty 90, 90d supply, fill #0

## 2021-01-14 MED ORDER — SULFAMETHOXAZOLE-TRIMETHOPRIM 800-160 MG PO TABS
1.0000 | ORAL_TABLET | Freq: Two times a day (BID) | ORAL | 0 refills | Status: AC
Start: 2021-01-14 — End: 2021-01-24
  Filled 2021-01-14: qty 14, 7d supply, fill #0

## 2021-01-14 MED ORDER — LOSARTAN POTASSIUM 25 MG PO TABS
25.0000 mg | ORAL_TABLET | Freq: Two times a day (BID) | ORAL | 0 refills | Status: DC
  Filled 2021-01-14: qty 180, 90d supply, fill #0

## 2021-01-14 MED ORDER — AMLODIPINE BESYLATE 5 MG PO TABS
5.0000 mg | ORAL_TABLET | Freq: Two times a day (BID) | ORAL | 0 refills | Status: DC
  Filled 2021-01-14: qty 180, 90d supply, fill #0
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
## 2021-01-14 NOTE — Patient Instructions (Addendum)
For ongoing hearing loss, please call your ENT to follow up on this as nothing was noted on your physical exam today to explain the hearing loss.  Stop by the lab prior to leaving today. I will notify you of your results once received.   Complete xray(s) prior to leaving today. I will notify you of your results once received.  Start monitoring your blood pressure daily, around the same time of day, for the next 2-3 weeks.  Ensure that you have rested for 30 minutes prior to checking your blood pressure. Record your readings and bring them to your next visit.  Please go home and take your medication as prescribed, you need to work on compliance. Please restart your diabetic medication and take as prescribed as your diabetes is currently uncontrolled.  For lower back pain management:  Plan of rest, intermittent application of cold packs (later, may switch to heat, but do not sleep on heating pad), analgesics .  Discussed longer term treatment plan of prn NSAID's and home back care exercise program with flexion exercise routine.  Proper lifting mechanics with avoidance of heavy lifting discussed.  Consider Physical Therapy, xray ordered today, pending results.  Call or return to clinic prn if these symptoms worsen or fail to improve as anticipated.  Return immediately if worsening.     It was a pleasure seeing you today! Please do not hesitate to reach out with any questions and or concerns.  Regards,   Eugenia Pancoast FNP-C

## 2021-01-14 NOTE — Telephone Encounter (Signed)
Attempted to call pt in regards to results. Will send mychart message.

## 2021-01-17 ENCOUNTER — Telehealth: Payer: Self-pay | Admitting: Family

## 2021-01-17 ENCOUNTER — Other Ambulatory Visit: Payer: Self-pay

## 2021-01-17 ENCOUNTER — Encounter: Payer: Self-pay | Admitting: *Deleted

## 2021-01-17 ENCOUNTER — Other Ambulatory Visit: Payer: Self-pay | Admitting: Family Medicine

## 2021-01-17 DIAGNOSIS — R319 Hematuria, unspecified: Secondary | ICD-10-CM | POA: Insufficient documentation

## 2021-01-17 DIAGNOSIS — N3001 Acute cystitis with hematuria: Secondary | ICD-10-CM

## 2021-01-17 DIAGNOSIS — H9191 Unspecified hearing loss, right ear: Secondary | ICD-10-CM | POA: Insufficient documentation

## 2021-01-17 DIAGNOSIS — R809 Proteinuria, unspecified: Secondary | ICD-10-CM | POA: Insufficient documentation

## 2021-01-17 MED ORDER — FREESTYLE LIBRE 14 DAY SENSOR MISC
1.0000 [IU] | 3 refills | Status: DC | PRN
  Filled 2021-01-17: qty 4, 56d supply, fill #0
  Filled 2021-05-30: qty 4, 56d supply, fill #1

## 2021-01-17 NOTE — Assessment & Plan Note (Signed)
Lumbar x-ray ordered today pending results.  Urine as well as culture ordered also pending results.  Handout given to patient for back exercises, advised to use heat and/or ice alternatively, and tylenol as necessary for pain.

## 2021-01-17 NOTE — Assessment & Plan Note (Signed)
Physical exam of the right ear negative for any acute findings patient to follow-up with her ear nose and throat doctor for further evaluation and treatment,

## 2021-01-17 NOTE — Assessment & Plan Note (Addendum)
Continue to treat for UTI as today dipstick shows protein, glucose, hematuria and bacteria in the urine.  Bactrim 800/160 mg p.o. twice daily for 7 days sent to the pharmacy patient to start taking as directed and to increase oral fluids.  I will have patient follow-up in 2 weeks to drop off a urine repeat urinalysis with culture.  Significant amount of time was spent with patient as well as with patient out of the office reviewing lab work, prior notes, and discussing compliance with patient which approximated about 60 minutes total.

## 2021-01-17 NOTE — Assessment & Plan Note (Addendum)
Again, patient is noncompliant with taking medications.  She is not currently taking her Trulicity.  She states that she thought it was causing upstate upset stomach but that was ruled out when she stopped it and still has stomach upset.  Patient to restart Trulicity and take as directed as she has at home.  Her hemoglobin A1c was 9.6 in November, advised patient this is not at goal and can be very dangerous if left untreated.  Patient states she will restart.  Patient to work on a diabetic diet and exercise as tolerated

## 2021-01-17 NOTE — Telephone Encounter (Signed)
Please call pt to schedule LAB ONLY appt in two weeks and have pt drop of urine sample so we can repeat and see if resolution of protein and urine after treatment with antibiotic.

## 2021-01-17 NOTE — Assessment & Plan Note (Signed)
Significant proteinuria at office as well as low back pain will order renal ultrasound pending results.,

## 2021-01-17 NOTE — Assessment & Plan Note (Signed)
Treating for UTI today, patient to return and drop off urine sample in 2 weeks to see if resolution.  If there is no resolution will refer to urology.

## 2021-01-17 NOTE — Assessment & Plan Note (Signed)
Pt noncompliant with medication regimen, not currently taking losartan and or amlodipine.  Stressed the patient the importance of taking this medication daily, refill sent to pharmacy and advised patient to start taking instructed immediately.  Work on a low-sodium diet and exercise as tolerated

## 2021-01-26 ENCOUNTER — Other Ambulatory Visit: Payer: Self-pay

## 2021-01-26 ENCOUNTER — Encounter: Payer: Self-pay | Admitting: Family

## 2021-01-26 ENCOUNTER — Other Ambulatory Visit: Payer: Self-pay | Admitting: Family

## 2021-01-26 DIAGNOSIS — B3731 Acute candidiasis of vulva and vagina: Secondary | ICD-10-CM

## 2021-01-26 MED ORDER — FLUCONAZOLE 150 MG PO TABS: 150.0000 mg | ORAL_TABLET | Freq: Once | ORAL | 0 refills | Status: AC

## 2021-01-26 NOTE — Telephone Encounter (Signed)
Sent message via my chart. Diflucan sent in to pharmacy.

## 2021-01-26 NOTE — Telephone Encounter (Signed)
Pt called she just finished her antibiotic and has developed a yeast infection and would like something called in

## 2021-01-28 ENCOUNTER — Ambulatory Visit
Admission: RE | Admit: 2021-01-28 | Discharge: 2021-01-28 | Disposition: A | Discharge status: Home / Self Care | Payer: HMO | Source: Ambulatory Visit | Attending: Family | Admitting: Family

## 2021-01-28 ENCOUNTER — Other Ambulatory Visit: Payer: Self-pay

## 2021-01-28 DIAGNOSIS — R319 Hematuria, unspecified: Secondary | ICD-10-CM

## 2021-01-28 DIAGNOSIS — E1159 Type 2 diabetes mellitus with other circulatory complications: Secondary | ICD-10-CM | POA: Diagnosis not present

## 2021-01-28 DIAGNOSIS — G8929 Other chronic pain: Secondary | ICD-10-CM | POA: Insufficient documentation

## 2021-01-28 DIAGNOSIS — M545 Low back pain, unspecified: Secondary | ICD-10-CM

## 2021-01-28 DIAGNOSIS — I152 Hypertension secondary to endocrine disorders: Secondary | ICD-10-CM | POA: Diagnosis not present

## 2021-01-28 DIAGNOSIS — R809 Proteinuria, unspecified: Secondary | ICD-10-CM | POA: Diagnosis not present

## 2021-02-09 DIAGNOSIS — E113312 Type 2 diabetes mellitus with moderate nonproliferative diabetic retinopathy with macular edema, left eye: Secondary | ICD-10-CM | POA: Diagnosis not present

## 2021-02-28 ENCOUNTER — Encounter: Payer: HMO | Attending: Family Medicine | Admitting: Dietician

## 2021-02-28 ENCOUNTER — Other Ambulatory Visit: Payer: Self-pay

## 2021-02-28 ENCOUNTER — Encounter: Payer: Self-pay | Admitting: Dietician

## 2021-02-28 VITALS — BP 128/90 | Ht 61.0 in | Wt 181.5 lb

## 2021-02-28 DIAGNOSIS — E1149 Type 2 diabetes mellitus with other diabetic neurological complication: Secondary | ICD-10-CM | POA: Diagnosis not present

## 2021-02-28 DIAGNOSIS — E1165 Type 2 diabetes mellitus with hyperglycemia: Secondary | ICD-10-CM

## 2021-02-28 NOTE — Patient Instructions (Signed)
Blend potatoes with low carb veggies to stretch portions. Try frozen vegetable blends, adding onions and peppers when baking/ roasting potatoes. Be careful to avoid any large portions of high fiber foods especially raw vegetables, fruits with hard seeds and skins, beans, seeds, nuts, popcorn, corn to prevent intestinal irritation. Make sure to include a protein food with every meal.

## 2021-02-28 NOTE — Progress Notes (Signed)
Diabetes Self-Management Education  Visit Type:  Follow-up  Appt. Start Time: 1100 Appt. End Time: 1200  02/28/2021  Patricia Horne, identified by name and date of birth, is a 71 y.o. female with a diagnosis of Diabetes:  .   ASSESSMENT  Blood pressure 128/90, height 5\' 1"  (1.549 m), weight 181 lb 8 oz (82.3 kg). Body mass index is 34.29 kg/m.    Diabetes Self-Management Education - 36/14/43 1540       Complications   How often do you check your blood sugar? 0 times/day (not testing)    Have you had a dilated eye exam in the past 12 months? Yes    Have you had a dental exam in the past 12 months? Yes   01/2021   Are you checking your feet? Yes    How many days per week are you checking your feet? 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      Post-Education Assessment   Patient understands the diabetes disease and treatment process. Demonstrates understanding / competency    Patient understands incorporating nutritional management into lifestyle. Demonstrates understanding / competency    Patient undertands incorporating physical activity into lifestyle. Demonstrates understanding / competency    Patient understands using medications safely. Demonstrates understanding / competency    Patient understands monitoring blood glucose, interpreting and using results Needs Review    Patient understands prevention,  detection, and treatment of acute complications. Demonstrates understanding / competency    Patient understands prevention, detection, and treatment of chronic complications. Demonstrates understanding / competency    Patient understands how to develop strategies to address psychosocial issues. Demonstrates understanding / competency    Patient understands how to develop strategies to promote health/change behavior. Needs Review      Outcomes   Program Status Completed             Learning Objective:  Patient will have a greater understanding of diabetes self-management. Patient education plan is to attend individual and/or group sessions per assessed needs and concerns.  Additional Notes: Patient is generally consuming balanced meals. Provided discussion and menus for more balanced meal ideas, easy cooking methods. Patient reports having diarrhea when taking Trulicity; she has stopped taking and symptoms have improved. Discussed importance of resuming some diabetes medication to improve BG control and reduce risk for complications.  She is not currently testing BGs with her [facility], as she has been having pain in both arms.   Plan:   Patient Instructions  Blend potatoes with low carb veggies to stretch portions. Try frozen vegetable blends, adding onions and peppers when baking/ roasting potatoes. Be careful to avoid any large portions of high fiber foods especially raw vegetables, fruits with hard seeds and skins, beans, seeds, nuts, popcorn, corn to prevent intestinal irritation. Make sure to include a protein food with every meal.  Expected Outcomes:  Demonstrated interest in learning. Expect positive outcomes  Education material provided: Quick and Simple Meal Ideas; Planned-overs  If problems or questions, patient to contact team via:  Phone and MyChart message

## 2021-03-01 ENCOUNTER — Telehealth: Payer: Self-pay

## 2021-03-01 NOTE — Telephone Encounter (Signed)
Please call pt and schedule a colonoscopy.   [phone]

## 2021-03-02 ENCOUNTER — Telehealth: Payer: Self-pay

## 2021-03-02 ENCOUNTER — Telehealth: Payer: Self-pay | Admitting: Family Medicine

## 2021-03-02 NOTE — Telephone Encounter (Signed)
Scheduled for 05/31/2021

## 2021-03-02 NOTE — Telephone Encounter (Signed)
Spoke with Patricia Horne.  She states the handicap placard application we mailed her was marked temporary for 6 months.  She states she normally only has to renew every 5 years.  Will fill out new application and have Dr. Diona Browner sign in on Thursday when she is back in the office.  Patient request we mail it when ready.

## 2021-03-02 NOTE — Telephone Encounter (Signed)
Pt called asking for a call back to discuss her handicap sticker. Please advise.

## 2021-03-03 ENCOUNTER — Telehealth: Payer: Self-pay

## 2021-03-03 NOTE — Telephone Encounter (Signed)
CALLED PATIENT NO ANSWER LEFT VOICEMAIL FOR A CALL BACK ? ?

## 2021-03-23 DIAGNOSIS — E113312 Type 2 diabetes mellitus with moderate nonproliferative diabetic retinopathy with macular edema, left eye: Secondary | ICD-10-CM | POA: Diagnosis not present

## 2021-03-28 ENCOUNTER — Ambulatory Visit
Admission: RE | Admit: 2021-03-28 | Discharge: 2021-03-28 | Disposition: A | Discharge status: Home / Self Care | Payer: HMO | Source: Ambulatory Visit | Attending: Internal Medicine | Admitting: Internal Medicine

## 2021-03-28 ENCOUNTER — Other Ambulatory Visit: Payer: Self-pay

## 2021-03-28 VITALS — BP 182/97 | HR 79 | Temp 99.2°F | Resp 20

## 2021-03-28 DIAGNOSIS — A09 Infectious gastroenteritis and colitis, unspecified: Secondary | ICD-10-CM | POA: Diagnosis not present

## 2021-03-28 DIAGNOSIS — R110 Nausea: Secondary | ICD-10-CM

## 2021-03-28 LAB — POCT FASTING CBG KUC MANUAL ENTRY: POCT Glucose (KUC): 186 mg/dL — AB (ref 70–99)

## 2021-03-28 MED ORDER — ONDANSETRON HCL 4 MG PO TABS
4.0000 mg | ORAL_TABLET | Freq: Three times a day (TID) | ORAL | 0 refills | Status: DC | PRN
  Filled 2021-03-28: qty 20, 7d supply, fill #0

## 2021-03-28 NOTE — ED Triage Notes (Signed)
Pt here with nausea, abdominal pain, and diarrhea x 3 days. Pt endorses not taking any of her medications for several weeks now, including her HTN meds and Trulicity. Pt admits she is non-compliant with meds and lifestyle changes. Pt took imodium yesterday and that has calmed her diarrhea down some, but states she is not really drinking enough water. Pt has not checked her CBG recently.

## 2021-03-28 NOTE — Discharge Instructions (Addendum)
Take pepto, instead of imodium I sent Zofran for your nausea
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
## 2021-03-29 NOTE — Progress Notes (Signed)
Subjective:   Patricia Horne is a 71 y.o. female who presents for Medicare Annual (Subsequent) preventive examination.  I connected with Junior D. Dansereau today by telephone and verified that I am speaking with the correct person using two identifiers. Location patient: home Location provider: work Persons participating in the virtual visit: patient, Marine scientist.    I discussed the limitations, risks, security and privacy concerns of performing an evaluation and management service by telephone and the availability of in person appointments. I also discussed with the patient that there may be a patient responsible charge related to this service. The patient expressed understanding and verbally consented to this telephonic visit.    Interactive audio and video telecommunications were attempted between this provider and patient, however failed, due to patient having technical difficulties OR patient did not have access to video capability.  We continued and completed visit with audio only.  Some vital signs may be absent or patient reported.   Time Spent with patient on telephone encounter: 20 minutes  Review of Systems     Cardiac Risk Factors include: advanced age (>78mn, >>80women);diabetes mellitus;hypertension;dyslipidemia     Objective:    Today's Vitals   03/30/21 1446  Weight: 181 lb (82.1 kg)  Height: 5' 1"  (1.549 m)   Body mass index is 34.2 kg/m.  Advanced Directives 03/30/2021 12/03/2020 11/29/2020 09/29/2019 05/20/2019 06/12/2018 06/06/2018  Does Patient Have a Medical Advance Directive? No No No No No No No  Type of Advance Directive - - - - - - -  Copy of Healthcare Power of Attorney in Chart? - - - - - - -  Would patient like information on creating a medical advance directive? Yes (MAU/Ambulatory/Procedural Areas - Information given) No - Patient declined No - Patient declined No - Patient declined - No - Patient declined No - Patient declined    Current Medications  (verified) Outpatient Encounter Medications as of 03/30/2021  Medication Sig   amLODipine (NORVASC) 5 MG tablet Take 1 tablet (5 mg total) by mouth in the morning and at bedtime.   acetaminophen (TYLENOL) 500 MG tablet Take 500 mg by mouth every 6 (six) hours as needed. (Patient not taking: Reported on 01/14/2021)   Continuous Blood Gluc Receiver (FREESTYLE LIBRE 14 DAY READER) DEVI 1 EACH BY DOES NOT APPLY ROUTE 3 (THREE) TIMES DAILY AS NEEDED. (Patient not taking: Reported on 11/08/2020)   Continuous Blood Gluc Sensor (FREESTYLE LIBRE 14 DAY SENSOR) MISC 1 UNITS BY DOES NOT APPLY ROUTE EVERY 3 (THREE) DAYS AS NEEDED. (Patient not taking: Reported on 03/30/2021)   Dulaglutide (TRULICITY) 03.97MQB/3.4LPSOPN Inject 0.75 mg into the skin once a week. (Patient not taking: Reported on 11/08/2020)   gabapentin (NEURONTIN) 100 MG capsule Take 1 capsule (100 mg total) by mouth at bedtime. (Patient not taking: Reported on 02/28/2021)   glucose monitoring kit (FREESTYLE) monitoring kit 1 each by Does not apply route as needed for other. (Patient not taking: Reported on 11/08/2020)   Insulin Pen Needle (PEN NEEDLES) 31G X 8 MM MISC Pen needles for Trulicity, EF79.024(Patient not taking: Reported on 11/08/2020)   ketorolac (ACULAR) 0.5 % ophthalmic solution Place 1 drop into the left eye 4 (four) times daily as needed. (Patient not taking: Reported on 01/14/2021)   loperamide (IMODIUM) 2 MG capsule Take by mouth as needed for diarrhea or loose stools. (Patient not taking: Reported on 12/07/2020)   losartan (COZAAR) 25 MG tablet Take 1 tablet (25 mg total) by mouth  in the morning and at bedtime. (Patient not taking: Reported on 03/30/2021)   ondansetron (ZOFRAN) 4 MG tablet Take 1 tablet (4 mg total) by mouth every 8 (eight) hours as needed for nausea or vomiting. (Patient not taking: Reported on 03/30/2021)   Vitamin D, Ergocalciferol, (DRISDOL) 1.25 MG (50000 UNIT) CAPS capsule Take 1 capsule by mouth once a week.  (Patient not taking: Reported on 01/14/2021)   No facility-administered encounter medications on file as of 03/30/2021.    Allergies (verified) Amoxicillin   History: Past Medical History:  Diagnosis Date   Abdominal pain, left lower quadrant    Acute upper respiratory infections of unspecified site    Allergic genetic state    Arthritis    in arms   Glaucoma    Lipoma of unspecified site    Neuropathy    Obesity    Other and unspecified noninfectious gastroenteritis and colitis(558.9)    Other screening mammogram    Routine general medical examination at a health care facility    Screening for lipoid disorders    Special screening for osteoporosis    Tibialis tendinitis    Tubular adenoma of colon 03/31/2014   Type II or unspecified type diabetes mellitus without mention of complication, not stated as uncontrolled    Undiagnosed cardiac murmurs    Unspecified essential hypertension    Past Surgical History:  Procedure Laterality Date   CATARACT EXTRACTION W/PHACO Left 09/29/2019   Procedure: CATARACT EXTRACTION PHACO AND INTRAOCULAR LENS PLACEMENT ([facility]) LEFT DIABETIC 3.42 00:32.9;  Surgeon: Eulogio Bear, MD;  Location: [facility];  Service: Ophthalmology;  Laterality: Left;   COLONOSCOPY  04/01/2004   COLONOSCOPY  03/31/2014   COLONOSCOPY WITH PROPOFOL N/A 01/01/2018   Procedure: COLONOSCOPY WITH PROPOFOL;  Surgeon: Lollie Sails, MD;  Location[facility] ENDOSCOPY;  Service: Endoscopy;  Laterality: N/A;   LIPOMA EXCISION Left 2010   arm   TOE SURGERY  1990's   bunion, hammer toe repair   TOTAL ABDOMINAL HYSTERECTOMY  1983   one ovary removed, later other ovary removed   Family History  Problem Relation Age of Onset   Diabetes Mother    Arthritis Father    Diabetes Brother        type 1   Ulcerative colitis Paternal Grandmother    Colon polyps Paternal Grandfather    Breast cancer Neg Hx    Social History   Socioeconomic History   Marital status:  Single    Spouse name: Not on file   Number of children: 0   Years of education: Not on file   Highest education level: Not on file  Occupational History   Occupation: Control and instrumentation engineer, Museum/gallery curator at [employer]  Tobacco Use   Smoking status: Never   Smokeless tobacco: Never  Vaping Use   Vaping Use: Never used  Substance and Sexual Activity   Alcohol use: No    Alcohol/week: 0.0 standard drinks   Drug use: No   Sexual activity: Not Currently  Other Topics Concern   Not on file  Social History Narrative   Caffeine use: soda/Pepsi   Fast food.  Regular exercise at work.   No sex.   Social Determinants of Health   Financial Resource Strain: Low Risk    Difficulty of Paying Living Expenses: Not hard at all  Food Insecurity: No Food Insecurity   Worried About Charity fundraiser in the Last Year: Never true   [place] in the Last Year:  Never true  Transportation Needs: No Transportation Needs   Lack of Transportation (Medical): No   Lack of Transportation (Non-Medical): No  Physical Activity: Inactive   Days of Exercise per Week: 0 days   Minutes of Exercise per Session: 0 min  Stress: No Stress Concern Present   Feeling of Stress : Not at all  Social Connections: Moderately Isolated   Frequency of Communication with Friends and Family: More than three times a week   Frequency of Social Gatherings with Friends and Family: Three times a week   Attends Religious Services: 1 to 4 times per year   Active Member of Clubs or Organizations: No   Attends Archivist Meetings: Never   Marital Status: Never married    Tobacco Counseling Counseling given: Not Answered   Clinical Intake:  Pre-visit preparation completed: Yes  Pain : No/denies pain     BMI - recorded: 34.2 Nutritional Status: BMI > 30  Obese Nutritional Risks: Unintentional weight loss (due to having a virus) Diabetes: Yes CBG done?: No Did pt. bring in CBG monitor from home?: No  How often  do you need to have someone help you when you read instructions, pamphlets, or other written materials from your doctor or pharmacy?: 1 - Never  Diabetes:  Is the patient diabetic?  Yes  If diabetic, was a CBG obtained today?  No  Did the patient bring in their glucometer from home?  No  How often do you monitor your CBG's? Patient states not monitoring.   Financial Strains and Diabetes Management:  Are you having any financial strains with the device, your supplies or your medication? No .  Does the patient want to be seen by Chronic Care Management for management of their diabetes?  No  Would the patient like to be referred to a Nutritionist or for Diabetic Management?  No   Diabetic Exams:  Diabetic Eye Exam: Completed 03/2021.   Diabetic Foot Exam: Completed 06/14/20.   Interpreter Needed?: No  Information entered by :: Orrin Brigham LPN   Activities of Daily Living In your present state of health, do you have any difficulty performing the following activities: 03/30/2021 06/29/2020  Hearing? Y Y  Comment decrease hearing in righ ear -  Vision? N Y  Difficulty concentrating or making decisions? N Y  Walking or climbing stairs? N Y  Dressing or bathing? N N  Doing errands, shopping? N N  Preparing Food and eating ? N -  Using the Toilet? N -  In the past six months, have you accidently leaked urine? Y -  Comment occasional -  Do you have problems with loss of bowel control? Y -  Comment had virus -  Managing your Medications? N -  Managing your Finances? N -  Housekeeping or managing your Housekeeping? N -  Some recent data might be hidden    Patient Care Team: Jinny Sanders, MD as PCP - General Kate Sable, MD as PCP - Cardiology (Cardiology) [name], [facility] as Pharmacist (Pharmacist)  Indicate any recent Medical Services you may have received from other than [facility] providers in the past year (date may be approximate).     Assessment:   This is a  routine wellness examination for Saran.  Hearing/Vision screen Hearing Screening - Comments:: Decrease hearing in right ear Vision Screening - Comments:: Last exam 03/2021, Dr. Edison Pace  Dietary issues and exercise activities discussed: Current Exercise Habits: The patient does not participate in regular exercise at present  Goals Addressed             This Visit's Progress    Patient Stated       Would like to start taking medications and go to the gym        Depression Screen PHQ 2/9 Scores 03/30/2021 11/29/2020 09/23/2019 06/17/2019 06/12/2018 05/09/2016  PHQ - 2 Score 0 0 0 0 0 2  PHQ- 9 Score - - - - 0 10    Fall Risk Fall Risk  03/30/2021 02/28/2021 11/29/2020 09/23/2019 06/17/2019  Falls in the past year? 1 0 0 1 1  Number falls in past yr: 0 - - 1 1  Injury with Fall? 0 - - 0 0  Risk for fall due to : Other (Comment) - - - -  Risk for fall due to: Comment tripped - - - -  Follow up Falls prevention discussed - - - -    FALL RISK PREVENTION PERTAINING TO THE HOME:  Any stairs in or around the home? Yes  If so, are there any without handrails? No  Home free of loose throw rugs in walkways, pet beds, electrical cords, etc? Yes  Adequate lighting in your home to reduce risk of falls? Yes   ASSISTIVE DEVICES UTILIZED TO PREVENT FALLS:  Life alert? No  Use of a cane, walker or w/c? No  Grab bars in the bathroom? No  Shower chair or bench in shower? Yes  Elevated toilet seat or a handicapped toilet? No   TIMED UP AND GO:  Was the test performed? No .    Cognitive Function: Normal cognitive status assessed by  this Nurse Health Advisor. No abnormalities found.   MMSE - Mini Mental State Exam 06/12/2018  Orientation to time 5  Orientation to Place 5  Registration 3  Attention/ Calculation 0  Recall 3  Language- name 2 objects 0  Language- repeat 1  Language- follow 3 step command 0  Language- read & follow direction 0  Write a sentence 0  Copy design 0  Total  score 17        Immunizations Immunization History  Administered Date(s) Administered   Fluad Quad(high Dose 65+) 10/01/2018   Influenza Split 09/14/2010   Influenza, High Dose Seasonal PF 10/30/2016, 09/13/2017   Influenza,inj,Quad PF,6+ Mos 10/09/2012, 10/30/2014, 10/29/2015   Influenza-Unspecified 10/30/2013, 10/20/2019   PFIZER(Purple Top)SARS-COV-2 Vaccination 01/21/2019, 02/11/2019, 11/14/2019   Pfizer Covid-19 Vaccine Bivalent Booster 35yr & up 11/15/2020   Pneumococcal Conjugate-13 05/09/2016   Pneumococcal Polysaccharide-23 10/30/2014, 03/26/2020   Td 01/31/2000, 03/02/2009    TDAP status: Due, Education has been provided regarding the importance of this vaccine. Advised may receive this vaccine at local pharmacy or Health Dept. Aware to provide a copy of the vaccination record if obtained from local pharmacy or Health Dept. Verbalized acceptance and understanding.  Flu Vaccine status: Up to date  Pneumococcal vaccine status: Up to date  Covid-19 vaccine status: Completed vaccines  Qualifies for Shingles Vaccine? Yes   Zostavax completed No   Shingrix Completed?: No.    Education has been provided regarding the importance of this vaccine. Patient has been advised to call insurance company to determine out of pocket expense if they have not yet received this vaccine. Advised may also receive vaccine at local pharmacy or Health Dept. Verbalized acceptance and understanding.  Screening Tests Health Maintenance  Topic Date Due   Zoster Vaccines- Shingrix (1 of 2) Never done   TETANUS/TDAP  03/03/2019   INFLUENZA VACCINE  08/30/2020   COLONOSCOPY (Pts 45-60yr Insurance coverage will need to be confirmed)  01/01/2021   OPHTHALMOLOGY EXAM  03/24/2021   HEMOGLOBIN A1C  05/30/2021   FOOT EXAM  06/14/2021   MAMMOGRAM  04/23/2022   Pneumonia Vaccine 71 Years old  Completed   DEXA SCAN  Completed   COVID-19 Vaccine  Completed   Hepatitis C Screening  Completed   HPV  VACCINES  Aged Out    Health Maintenance  Health Maintenance Due  Topic Date Due   Zoster Vaccines- Shingrix (1 of 2) Never done   TETANUS/TDAP  03/03/2019   INFLUENZA VACCINE  08/30/2020   COLONOSCOPY (Pts 45-463yrInsurance coverage will need to be confirmed)  01/01/2021   OPHTHALMOLOGY EXAM  03/24/2021    Colorectal Cancer screening: Patient states having an upcoming appointment with GI 05/2021  Mammogram status: Ordered 03/30/21. Pt provided with contact info and advised to call to schedule appt.   Bone Density status: Ordered 03/30/21. Pt provided with contact info and advised to call to schedule appt.  Lung Cancer Screening: (Low Dose CT Chest recommended if Age 71-80ears, 30 pack-year currently smoking OR have quit w/in 15years.) does not qualify.     Additional Screening:  Hepatitis C Screening: does qualify; Completed 03/30/15  Vision Screening: Recommended annual ophthalmology exams for early detection of glaucoma and other disorders of the eye. Is the patient up to date with their annual eye exam?  Yes  Who is the provider or what is the name of the office in which the patient attends annual eye exams? Dr. KiEdison Pace Dental Screening: Recommended annual dental exams for proper oral hygiene  Community Resource Referral / Chronic Care Management: CRR required this visit?  No   CCM required this visit?  No      Plan:     I have personally reviewed and noted the following in the patients chart:   Medical and social history Use of alcohol, tobacco or illicit drugs  Current medications and supplements including opioid prescriptions.  Functional ability and status Nutritional status Physical activity Advanced directives List of other physicians Hospitalizations, surgeries, and ER visits in previous 12 months Vitals Screenings to include cognitive, depression, and falls Referrals and appointments  In addition, I have reviewed and discussed with patient certain  preventive protocols, quality metrics, and best practice recommendations. A written personalized care plan for preventive services as well as general preventive health recommendations were provided to patient.   Due to this being a telephonic visit, the after visit summary with patients personalized plan was offered to patient via mail or my-chart. Patient would like to access on my-chart.    TaLoma MessingLPN   3/02/02/4313 Nurse Health Advisor  Nurse Notes: Patient plans on providing update flu vaccine information

## 2021-03-30 ENCOUNTER — Ambulatory Visit (INDEPENDENT_AMBULATORY_CARE_PROVIDER_SITE_OTHER): Payer: HMO

## 2021-03-30 VITALS — Ht 61.0 in | Wt 181.0 lb

## 2021-03-30 DIAGNOSIS — Z780 Asymptomatic menopausal state: Secondary | ICD-10-CM | POA: Diagnosis not present

## 2021-03-30 DIAGNOSIS — Z1231 Encounter for screening mammogram for malignant neoplasm of breast: Secondary | ICD-10-CM | POA: Diagnosis not present

## 2021-03-30 DIAGNOSIS — Z0000 Encounter for general adult medical examination without abnormal findings: Secondary | ICD-10-CM

## 2021-03-30 NOTE — Patient Instructions (Signed)
[name] , Thank you for taking time to complete your Medicare Wellness Visit. I appreciate your ongoing commitment to your health goals. Please review the following plan we discussed and let me know if I can assist you in the future.   Screening recommendations/referrals: Colonoscopy: due, last completed 01/01/18, per our conversation you have an appointment scheduled in May with GI Mammogram: last completed 04/22/20, ordered today, someone will call to schedule an appointment Bone Density: due, last completed 07/05/16, ordered today someone will call to schedule an appointment.  Recommended yearly ophthalmology/optometry visit for glaucoma screening and checkup Recommended yearly dental visit for hygiene and checkup  Vaccinations: Influenza vaccine: up to date, please provide vaccine information when available  Pneumococcal vaccine: up to date Tdap vaccine: due, last completed 03/02/09, Medicare may cover in the event that you are cut or injured  Shingles vaccine: Discuss with pharmacy   Covid-19:up to date  Advanced directives: Please bring a copy of Living Will and/or [place] for your chart, when available    Conditions/risks identified: see problem list   Next appointment: Follow up in one year for your annual wellness visit 04/03/22 @ 9:45am, this will be a telephone visit   Preventive Care 65 Years and Older, Female Preventive care refers to lifestyle choices and visits with your health care provider that can promote health and wellness. What does preventive care include? A yearly physical exam. This is also called an annual well check. Dental exams once or twice a year. Routine eye exams. Ask your health care provider how often you should have your eyes checked. Personal lifestyle choices, including: Daily care of your teeth and gums. Regular physical activity. Eating a healthy diet. Avoiding tobacco and drug use. Limiting alcohol use. Practicing safe  sex. Taking low-dose aspirin every day. Taking vitamin and mineral supplements as recommended by your health care provider. What happens during an annual well check? The services and screenings done by your health care provider during your annual well check will depend on your age, overall health, lifestyle risk factors, and family history of disease. Counseling  Your health care provider may ask you questions about your: Alcohol use. Tobacco use. Drug use. Emotional well-being. Home and relationship well-being. Sexual activity. Eating habits. History of falls. Memory and ability to understand (cognition). Work and work Statistician. Reproductive health. Screening  You may have the following tests or measurements: Height, weight, and BMI. Blood pressure. Lipid and cholesterol levels. These may be checked every 5 years, or more frequently if you are over 69 years old. Skin check. Lung cancer screening. You may have this screening every year starting at age 15 if you have a 30-pack-year history of smoking and currently smoke or have quit within the past 15 years. Fecal occult blood test (FOBT) of the stool. You may have this test every year starting at age 53. Flexible sigmoidoscopy or colonoscopy. You may have a sigmoidoscopy every 5 years or a colonoscopy every 10 years starting at age 41. Hepatitis C blood test. Hepatitis B blood test. Sexually transmitted disease (STD) testing. Diabetes screening. This is done by checking your blood sugar (glucose) after you have not eaten for a while (fasting). You may have this done every 1-3 years. Bone density scan. This is done to screen for osteoporosis. You may have this done starting at age 61. Mammogram. This may be done every 1-2 years. Talk to your health care provider about how often you should have regular mammograms. Talk with  your health care provider about your test results, treatment options, and if necessary, the need for more  tests. Vaccines  Your health care provider may recommend certain vaccines, such as: Influenza vaccine. This is recommended every year. Tetanus, diphtheria, and acellular pertussis (Tdap, Td) vaccine. You may need a Td booster every 10 years. Zoster vaccine. You may need this after age 76. Pneumococcal 13-valent conjugate (PCV13) vaccine. One dose is recommended after age 6. Pneumococcal polysaccharide (PPSV23) vaccine. One dose is recommended after age 68. Talk to your health care provider about which screenings and vaccines you need and how often you need them. This information is not intended to replace advice given to you by your health care provider. Make sure you discuss any questions you have with your health care provider. Document Released: 02/12/2015 Document Revised: 10/06/2015 Document Reviewed: 11/17/2014 Elsevier Interactive Patient Education  2017 [employer] Prevention in the Home Falls can cause injuries. They can happen to people of all ages. There are many things you can do to make your home safe and to help prevent falls. What can I do on the outside of my home? Regularly fix the edges of walkways and driveways and fix any cracks. Remove anything that might make you trip as you walk through a door, such as a raised step or threshold. Trim any bushes or trees on the path to your home. Use bright outdoor lighting. Clear any walking paths of anything that might make someone trip, such as rocks or tools. Regularly check to see if handrails are loose or broken. Make sure that both sides of any steps have handrails. Any raised decks and porches should have guardrails on the edges. Have any leaves, snow, or ice cleared regularly. Use sand or salt on walking paths during winter. Clean up any spills in your garage right away. This includes oil or grease spills. What can I do in the bathroom? Use night lights. Install grab bars by the toilet and in the tub and shower.  Do not use towel bars as grab bars. Use non-skid mats or decals in the tub or shower. If you need to sit down in the shower, use a plastic, non-slip stool. Keep the floor dry. Clean up any water that spills on the floor as soon as it happens. Remove soap buildup in the tub or shower regularly. Attach bath mats securely with double-sided non-slip rug tape. Do not have throw rugs and other things on the floor that can make you trip. What can I do in the bedroom? Use night lights. Make sure that you have a light by your bed that is easy to reach. Do not use any sheets or blankets that are too big for your bed. They should not hang down onto the floor. Have a firm chair that has side arms. You can use this for support while you get dressed. Do not have throw rugs and other things on the floor that can make you trip. What can I do in the kitchen? Clean up any spills right away. Avoid walking on wet floors. Keep items that you use a lot in easy-to-reach places. If you need to reach something above you, use a strong step stool that has a grab bar. Keep electrical cords out of the way. Do not use floor polish or wax that makes floors slippery. If you must use wax, use non-skid floor wax. Do not have throw rugs and other things on the floor that can make you trip.  What can I do with my stairs? Do not leave any items on the stairs. Make sure that there are handrails on both sides of the stairs and use them. Fix handrails that are broken or loose. Make sure that handrails are as long as the stairways. Check any carpeting to make sure that it is firmly attached to the stairs. Fix any carpet that is loose or worn. Avoid having throw rugs at the top or bottom of the stairs. If you do have throw rugs, attach them to the floor with carpet tape. Make sure that you have a light switch at the top of the stairs and the bottom of the stairs. If you do not have them, ask someone to add them for you. What else  can I do to help prevent falls? Wear shoes that: Do not have high heels. Have rubber bottoms. Are comfortable and fit you well. Are closed at the toe. Do not wear sandals. If you use a stepladder: Make sure that it is fully opened. Do not climb a closed stepladder. Make sure that both sides of the stepladder are locked into place. Ask someone to hold it for you, if possible. Clearly mark and make sure that you can see: Any grab bars or handrails. First and last steps. Where the edge of each step is. Use tools that help you move around (mobility aids) if they are needed. These include: Canes. Walkers. Scooters. Crutches. Turn on the lights when you go into a dark area. Replace any light bulbs as soon as they burn out. Set up your furniture so you have a clear path. Avoid moving your furniture around. If any of your floors are uneven, fix them. If there are any pets around you, be aware of where they are. Review your medicines with your doctor. Some medicines can make you feel dizzy. This can increase your chance of falling. Ask your doctor what other things that you can do to help prevent falls. This information is not intended to replace advice given to you by your health care provider. Make sure you discuss any questions you have with your health care provider. Document Released: 11/12/2008 Document Revised: 06/24/2015 Document Reviewed: 02/20/2014 Elsevier Interactive Patient Education  2017 [employer].

## 2021-04-05 ENCOUNTER — Other Ambulatory Visit: Payer: Self-pay

## 2021-04-05 ENCOUNTER — Telehealth: Payer: Self-pay

## 2021-04-05 ENCOUNTER — Encounter: Payer: Self-pay | Admitting: Gastroenterology

## 2021-04-05 ENCOUNTER — Encounter: Payer: Self-pay | Admitting: Family Medicine

## 2021-04-05 ENCOUNTER — Ambulatory Visit (INDEPENDENT_AMBULATORY_CARE_PROVIDER_SITE_OTHER): Payer: HMO | Admitting: Family Medicine

## 2021-04-05 VITALS — BP 172/102 | HR 84 | Temp 98.5°F | Ht 61.0 in | Wt 181.0 lb

## 2021-04-05 DIAGNOSIS — E1149 Type 2 diabetes mellitus with other diabetic neurological complication: Secondary | ICD-10-CM

## 2021-04-05 DIAGNOSIS — Z9114 Patient's other noncompliance with medication regimen: Secondary | ICD-10-CM

## 2021-04-05 DIAGNOSIS — M545 Low back pain, unspecified: Secondary | ICD-10-CM | POA: Diagnosis not present

## 2021-04-05 DIAGNOSIS — E1159 Type 2 diabetes mellitus with other circulatory complications: Secondary | ICD-10-CM

## 2021-04-05 DIAGNOSIS — G8929 Other chronic pain: Secondary | ICD-10-CM

## 2021-04-05 DIAGNOSIS — I152 Hypertension secondary to endocrine disorders: Secondary | ICD-10-CM | POA: Diagnosis not present

## 2021-04-05 DIAGNOSIS — K582 Mixed irritable bowel syndrome: Secondary | ICD-10-CM | POA: Diagnosis not present

## 2021-04-05 LAB — POCT GLYCOSYLATED HEMOGLOBIN (HGB A1C): Hemoglobin A1C: 9.2 % — AB (ref 4.0–5.6)

## 2021-04-05 MED ORDER — TIRZEPATIDE 2.5 MG/0.5ML ~~LOC~~ SOAJ
2.5000 mg | SUBCUTANEOUS | 3 refills | Status: DC
  Filled 2021-04-05: qty 2, 28d supply, fill #0
  Filled 2021-05-02: qty 2, 28d supply, fill #1
  Filled 2021-05-28: qty 2, 28d supply, fill #2

## 2021-04-05 MED ORDER — LOSARTAN POTASSIUM 25 MG PO TABS
25.0000 mg | ORAL_TABLET | Freq: Every day | ORAL | 11 refills | Status: DC
  Filled 2021-04-05: qty 30, 30d supply, fill #0
  Filled 2021-07-01: qty 30, 30d supply, fill #1

## 2021-04-05 MED ORDER — CHOLECALCIFEROL 1.25 MG (50000 UT) PO CAPS
ORAL_CAPSULE | ORAL | 0 refills | Status: DC
  Filled 2021-04-05: qty 12, 84d supply, fill #0

## 2021-04-05 NOTE — Assessment & Plan Note (Signed)
Chronic, inadequate control ? ?NSAIDs contraindicated given GI issues ?X-ray in 2023 showed degenerative disc disease and arthritis, no fractures ?She is noncompliant with home physical therapy. reviewed information on home physical therapy today in office and information given. ?Recommended referral to physical therapy versus Ortho..  To consider steroid injection but she is very hesitant. ?We will continue to use Tylenol to treat back pain.Marland Kitchen ?

## 2021-04-05 NOTE — Assessment & Plan Note (Signed)
Chronic, poorly controlled ? ?Noncompliant with medications ? ?She is willing to restart one of her medications but felt she was overtreated although no low blood pressure was ever noted. ? ?We will start back losartan 25 mg daily and follow blood pressures closely with a follow-up appointment in 2 weeks.  She is agreeable to this plan. ?

## 2021-04-05 NOTE — Assessment & Plan Note (Signed)
Poor control, noncompliant with medications ? ?She had excellent control of her diabetes with Trulicity but associates the medication with a worsening of her GI issues. ? ?She is open to trying a different GLP-1.  We will try low-dose Mounjaro and follow-up in 2 weeks.  She will continue working on [employer] and regular exercise ?

## 2021-04-05 NOTE — Telephone Encounter (Signed)
Patient seen today in office, requesting sooner appt with GI.  ?Dr Diona Browner reached out to me regarding getting this patient seen sooner even if it meant getting her an appt with [facility] in [place].  ?I reached out to [facility] and they were able to work the patient in this Thursday 04/07/21 with Dr Candis Schatz. They cancelled the appt with [facility] as they were scheduling out to May 2023 - unable to get the patient in sooner.  ? ?[facility] is booking out several weeks to months and cannot work patients in at this time.  ? ?Dr Diona Browner stated that she believed the patient would be okay with [facility] if they were able to see her sooner, which is what she was requesting, a sooner appt.  ? ?I have worked out a sooner appt due [facility] not being able to work the patient in. Now the patient is not wanting to go to [facility] and is wanting to be scheduled with [facility] again.  ? ?The patient can call to reschedule their appt with [facility]  [phone]. ? ? ?I have done all that I can do in getting this patient scheduled for a sooner appt at the request of both the patient and the PCP.  ? ?Sending to PCP/CMA to handle from here.  ?

## 2021-04-05 NOTE — Telephone Encounter (Signed)
Pt does not want to go to [facility] to [facility]. She was scheudled at [facility] with Dr Allen Norris on 52-23. Pt says Dr Diona Browner wants her seen sooner. Asking that we call to see if we can get her in at [facility] sooner. ?

## 2021-04-05 NOTE — Assessment & Plan Note (Signed)
Chronic ? ?She comes to the visit today with her cousin who recognizes the [facility] of being noncompliant with her medication.  Her cousin is very encouraging for the patient and is willing to try to help keep the patient on track with medication compliance. ?

## 2021-04-05 NOTE — Patient Instructions (Addendum)
Start losartan 25 mg daily. ? Start trial of Mounjaro. ? Can use Tylenol ES 2 tabs twice daily for back pain. ? Start home PT.. let me know if interested in referral to formal PT for low back pain. ?Call GI office and ask to have appt moved forward if able. ?

## 2021-04-05 NOTE — Progress Notes (Signed)
Patient ID: Patricia Horne, female    DOB: 1950-09-28, 71 y.o.   MRN: [id number]  This visit was conducted in person.  BP (!) 172/102    Pulse 84    Temp 98.5 F (36.9 C) (Temporal)    Ht 5' 1"  (1.549 m)    Wt 181 lb (82.1 kg)    SpO2 98%    BMI 34.20 kg/m    CC:  Chief Complaint  Patient presents with   Diabetes    Subjective:   HPI: Patricia Horne is a 71 y.o. female presenting on 04/05/2021 for Diabetes   She is not taking any of her medications.  She continues to have alternating diarrhea and constipation... even when not taking meds.  She has GI referral placed but cannot be seen per pt until 05/2021.  Diabetes:   inadequate control, she is noncompliant with her medication. Was previously at goal on Trulicity.   She wishes to try mounjaro... Lab Results  Component Value Date   HGBA1C 9.2 (A) 04/05/2021  Using medications without difficulties: Hypoglycemic episodes: Hyperglycemic episodes: Feet problems: Blood Sugars averaging: eye exam within last year:  Hypertension:   Was previously well controlled on Amlodipine 5 mg Losartan 25 mg daily   BP Readings from Last 3 Encounters:  04/05/21 (!) 172/102  03/28/21 (!) 182/97  02/28/21 128/90  Using medication without problems or lightheadedness: yes when on meds.. has not checked BP Chest pain with exertion:none Edema: occ Short of breath: none Average home BPs: not chekcing Other issues:       Relevant past medical, surgical, family and social history reviewed and updated as indicated. Interim medical history since our last visit reviewed. Allergies and medications reviewed and updated. Outpatient Medications Prior to Visit  Medication Sig Dispense Refill   ketorolac (ACULAR) 0.5 % ophthalmic solution Place 1 drop into the left eye 4 (four) times daily as needed.     acetaminophen (TYLENOL) 500 MG tablet Take 500 mg by mouth every 6 (six) hours as needed. (Patient not taking: Reported on 01/14/2021)      amLODipine (NORVASC) 5 MG tablet Take 1 tablet (5 mg total) by mouth in the morning and at bedtime. (Patient not taking: Reported on 04/05/2021) 180 tablet 0   Continuous Blood Gluc Receiver (FREESTYLE LIBRE 14 DAY READER) DEVI 1 EACH BY DOES NOT APPLY ROUTE 3 (THREE) TIMES DAILY AS NEEDED. (Patient not taking: Reported on 11/08/2020) 1 each 0   Continuous Blood Gluc Sensor (FREESTYLE LIBRE 14 DAY SENSOR) MISC 1 UNITS BY DOES NOT APPLY ROUTE EVERY 3 (THREE) DAYS AS NEEDED. (Patient not taking: Reported on 03/30/2021) 4 each 3   Dulaglutide (TRULICITY) 6.80 SU/1.1SR SOPN Inject 0.75 mg into the skin once a week. (Patient not taking: Reported on 11/08/2020) 9 mL 3   gabapentin (NEURONTIN) 100 MG capsule Take 1 capsule (100 mg total) by mouth at bedtime. (Patient not taking: Reported on 02/28/2021) 90 capsule 0   glucose monitoring kit (FREESTYLE) monitoring kit 1 each by Does not apply route as needed for other. (Patient not taking: Reported on 11/08/2020) 1 each 0   Insulin Pen Needle (PEN NEEDLES) 31G X 8 MM MISC Pen needles for Trulicity, P59.458 (Patient not taking: Reported on 11/08/2020) 50 each 11   loperamide (IMODIUM) 2 MG capsule Take by mouth as needed for diarrhea or loose stools. (Patient not taking: Reported on 12/07/2020)     losartan (COZAAR) 25 MG tablet Take 1 tablet (25 mg total)  by mouth in the morning and at bedtime. (Patient not taking: Reported on 03/30/2021) 180 tablet 0   ondansetron (ZOFRAN) 4 MG tablet Take 1 tablet (4 mg total) by mouth every 8 (eight) hours as needed for nausea or vomiting. (Patient not taking: Reported on 03/30/2021) 20 tablet 0   Vitamin D, Ergocalciferol, (DRISDOL) 1.25 MG (50000 UNIT) CAPS capsule Take 1 capsule by mouth once a week. (Patient not taking: Reported on 01/14/2021)     No facility-administered medications prior to visit.     Per HPI unless specifically indicated in ROS section below Review of Systems Objective:  BP (!) 172/102    Pulse 84     Temp 98.5 F (36.9 C) (Temporal)    Ht 5' 1"  (1.549 m)    Wt 181 lb (82.1 kg)    SpO2 98%    BMI 34.20 kg/m   Wt Readings from Last 3 Encounters:  04/05/21 181 lb (82.1 kg)  03/30/21 181 lb (82.1 kg)  02/28/21 181 lb 8 oz (82.3 kg)      Physical Exam Constitutional:      General: She is not in acute distress.    Appearance: Normal appearance. She is well-developed. She is obese. She is not ill-appearing or toxic-appearing.  HENT:     Head: Normocephalic.     Right Ear: Hearing, tympanic membrane, ear canal and external ear normal. Tympanic membrane is not erythematous, retracted or bulging.     Left Ear: Hearing, tympanic membrane, ear canal and external ear normal. Tympanic membrane is not erythematous, retracted or bulging.     Nose: No mucosal edema or rhinorrhea.     Right Sinus: No maxillary sinus tenderness or frontal sinus tenderness.     Left Sinus: No maxillary sinus tenderness or frontal sinus tenderness.     Mouth/Throat:     Pharynx: Uvula midline.  Eyes:     General: Lids are normal. Lids are everted, no foreign bodies appreciated.     Conjunctiva/sclera: Conjunctivae normal.     Pupils: Pupils are equal, round, and reactive to light.  Neck:     Thyroid: No thyroid mass or thyromegaly.     Vascular: No carotid bruit.     Trachea: Trachea normal.  Cardiovascular:     Rate and Rhythm: Normal rate and regular rhythm.     Pulses: Normal pulses.     Heart sounds: Normal heart sounds, S1 normal and S2 normal. No murmur heard.   No friction rub. No gallop.  Pulmonary:     Effort: Pulmonary effort is normal. No tachypnea or respiratory distress.     Breath sounds: Normal breath sounds. No decreased breath sounds, wheezing, rhonchi or rales.  Abdominal:     General: Bowel sounds are normal.     Palpations: Abdomen is soft.     Tenderness: There is no abdominal tenderness.  Musculoskeletal:     Cervical back: Normal range of motion and neck supple.  Skin:    General:  Skin is warm and dry.     Findings: No rash.  Neurological:     Mental Status: She is alert.  Psychiatric:        Mood and Affect: Mood is not anxious or depressed.        Speech: Speech normal.        Behavior: Behavior normal. Behavior is cooperative.        Thought Content: Thought content normal.        Judgment: Judgment normal.  Results for orders placed or performed in visit on 04/05/21  POCT glycosylated hemoglobin (Hb A1C)  Result Value Ref Range   Hemoglobin A1C 9.2 (A) 4.0 - 5.6 %   HbA1c POC (<> result, manual entry)     HbA1c, POC (prediabetic range)     HbA1c, POC (controlled diabetic range)      This visit occurred during the SARS-CoV-2 public health emergency.  Safety protocols were in place, including screening questions prior to the visit, additional usage of staff PPE, and extensive cleaning of exam room while observing appropriate contact time as indicated for disinfecting solutions.   COVID 19 screen:  No recent travel or known exposure to COVID19 The patient denies respiratory symptoms of COVID 19 at this time. The importance of social distancing was discussed today.   Assessment and Plan Problem List Items Addressed This Visit     Chronic low back pain    Chronic, inadequate control  NSAIDs contraindicated given GI issues X-ray in 2023 showed degenerative disc disease and arthritis, no fractures She is noncompliant with home physical therapy. reviewed information on home physical therapy today in office and information given. Recommended referral to physical therapy versus Ortho..  To consider steroid injection but she is very hesitant. We will continue to use Tylenol to treat back pain.Marland Kitchen      Hypertension associated with diabetes ([place])    Chronic, poorly controlled  Noncompliant with medications  She is willing to restart one of her medications but felt she was overtreated although no low blood pressure was ever noted.  We will start back  losartan 25 mg daily and follow blood pressures closely with a follow-up appointment in 2 weeks.  She is agreeable to this plan.      Relevant Medications   tirzepatide (MOUNJARO) 2.5 MG/0.5ML Pen   losartan (COZAAR) 25 MG tablet   IBS (irritable bowel syndrome)    She continues to have issues with IBS.  She continually blames all of her medicine on her IBS symptoms but at this point it is not clearly contributing since she is off all of her medications for quite some time.  A referral to GI has been has been placed for further recommendations.  Encouraged her to continue water and fiber.  Encouraged regular exercise and avoidance of greasy foods.      Noncompliance with medication regimen    Chronic  She comes to the visit today with her cousin who recognizes the [facility] of being noncompliant with her medication.  Her cousin is very encouraging for the patient and is willing to try to help keep the patient on track with medication compliance.      Type 2 diabetes mellitus with neurological complications (HCC) - Primary    Poor control, noncompliant with medications  She had excellent control of her diabetes with Trulicity but associates the medication with a worsening of her GI issues.  She is open to trying a different GLP-1.  We will try low-dose Mounjaro and follow-up in 2 weeks.  She will continue working on [employer] and regular exercise      Relevant Medications   tirzepatide (MOUNJARO) 2.5 MG/0.5ML Pen   losartan (COZAAR) 25 MG tablet   Other Relevant Orders   POCT glycosylated hemoglobin (Hb A1C) (Completed)   Meds ordered this encounter  Medications   tirzepatide (MOUNJARO) 2.5 MG/0.5ML Pen    Sig: Inject 2.5 mg into the skin once a week.    Dispense:  2 mL  Refill:  3   losartan (COZAAR) 25 MG tablet    Sig: Take 1 tablet (25 mg total) by mouth daily.    Dispense:  30 tablet    Refill:  11   Orders Placed This Encounter  Procedures   POCT glycosylated  hemoglobin (Hb A1C)        Eliezer Lofts, MD

## 2021-04-05 NOTE — Assessment & Plan Note (Signed)
She continues to have issues with IBS.  She continually blames all of her medicine on her IBS symptoms but at this point it is not clearly contributing since she is off all of her medications for quite some time.  A referral to GI has been has been placed for further recommendations. ? ?Encouraged her to continue water and fiber.  Encouraged regular exercise and avoidance of greasy foods. ?

## 2021-04-05 NOTE — Telephone Encounter (Signed)
Pt called back to say she did not get a Vitamin D rx at her OV today. She states she takes 50,000 units once a week. Please send to [facility]. ?

## 2021-04-06 ENCOUNTER — Other Ambulatory Visit: Payer: Self-pay

## 2021-04-06 NOTE — Telephone Encounter (Signed)
Patricia Horne notified as instructed by telephone.  She states she does not want to drive to [place].  She will call [facility] to reschedule her appointment.  She ask that I cancel the appointment with [facility].  FYI to Dr. Diona Browner.  ?

## 2021-04-06 NOTE — Telephone Encounter (Signed)
Please put the referral in for [facility] GI they can see her in May ? ?[facility] pushed her appointment back until June 15th   ? ? ?

## 2021-04-07 ENCOUNTER — Ambulatory Visit: Payer: HMO | Admitting: Gastroenterology

## 2021-04-07 ENCOUNTER — Other Ambulatory Visit: Payer: Self-pay | Admitting: Family Medicine

## 2021-04-07 DIAGNOSIS — E1149 Type 2 diabetes mellitus with other diabetic neurological complication: Secondary | ICD-10-CM

## 2021-04-07 DIAGNOSIS — K582 Mixed irritable bowel syndrome: Secondary | ICD-10-CM

## 2021-04-07 NOTE — Telephone Encounter (Signed)
Referral placed.

## 2021-04-11 ENCOUNTER — Encounter: Payer: Self-pay | Admitting: Family Medicine

## 2021-04-12 NOTE — Telephone Encounter (Signed)
New referral request from pt for [facility] ? ?Pt states that she does not want to go to [place] had her scheduled 04/07/21, pt declined.  ?[facility] in [place] could not see her until June 2023.  ? ?She is wanting to go back to [facility] - saw them 3-5 years ago. She states that they can see her in May 2023. ? ? ?Referral and OV notes faxed via [facility]. ?Pt is aware referral has been faxed and is going to call to schedule tomorrow.  ? ?Faxed to [phone]    ?[email or web address] ? ?Virl Cagey, CMA on 04/12/2021 1718 - delivered at 04/12/2021 1718 ? ? ? ?

## 2021-04-18 ENCOUNTER — Other Ambulatory Visit: Payer: Self-pay

## 2021-04-19 ENCOUNTER — Ambulatory Visit (INDEPENDENT_AMBULATORY_CARE_PROVIDER_SITE_OTHER): Payer: HMO | Admitting: Family Medicine

## 2021-04-19 ENCOUNTER — Other Ambulatory Visit: Payer: Self-pay

## 2021-04-19 VITALS — BP 138/86 | HR 81 | Temp 98.4°F | Resp 16 | Ht 61.0 in | Wt 178.4 lb

## 2021-04-19 DIAGNOSIS — E1149 Type 2 diabetes mellitus with other diabetic neurological complication: Secondary | ICD-10-CM

## 2021-04-19 DIAGNOSIS — G8929 Other chronic pain: Secondary | ICD-10-CM | POA: Diagnosis not present

## 2021-04-19 DIAGNOSIS — E1159 Type 2 diabetes mellitus with other circulatory complications: Secondary | ICD-10-CM

## 2021-04-19 DIAGNOSIS — M545 Low back pain, unspecified: Secondary | ICD-10-CM

## 2021-04-19 DIAGNOSIS — I10 Essential (primary) hypertension: Secondary | ICD-10-CM

## 2021-04-19 DIAGNOSIS — I152 Hypertension secondary to endocrine disorders: Secondary | ICD-10-CM

## 2021-04-19 MED ORDER — LOSARTAN POTASSIUM 50 MG PO TABS: 50.0000 mg | ORAL_TABLET | Freq: Every day | ORAL | 11 refills | Status: DC

## 2021-04-19 NOTE — Assessment & Plan Note (Signed)
Chronic, improved control but not at goal ? ?Her blood pressure is improving on losartan 25 mg daily.  Given home blood pressures are not at goal with every measurement, we will increase the losartan to 50 mg daily.  No prescription was sent in as she has a lot of 25 mg tablets and will increase the dose of this by taking 2 tabs daily.  She will remain off the amlodipine. ?

## 2021-04-19 NOTE — Assessment & Plan Note (Signed)
Chronic, improved control ? ?She has not yet started home physical therapy but will do this.  She states she is pleased with her level of pain using 2 extra strength Tylenol daily.  I have let her know that if she has a bad day with increased pain she can increase the frequency of this medication to every 8 hours. ?She is not interested in a referral to Ortho or PT at this time. ?

## 2021-04-19 NOTE — Progress Notes (Signed)
? ? Patient ID: Patricia Horne, female    DOB: May 31, 1950, 71 y.o.   MRN: [id number] ? ?This visit was conducted in person. ? ?BP 138/86 (BP Location: Left Arm, Patient Position: Sitting, Cuff Size: Normal)   Pulse 81   Temp 98.4 ?F (36.9 ?C) (Oral)   Resp 16   Ht _0  (1.549 m)   Wt 178 lb 6.4 oz (80.9 kg)   SpO2 98%   BMI 33.71 kg/m?   ? ?CC:  ?Chief Complaint  ?Patient presents with  ? Follow-up  ?  1 wk back pain, htn. Back pain still around the same has been taking 2 tylenol extra strength with some relief.  ?BP's have been running around 140's/90's  ? ? ?Subjective:  ? ?HPI: ?Patricia Horne is a 71 y.o. female presenting on 04/19/2021 for Follow-up (1 wk back pain, htn. Back pain still around the same has been taking 2 tylenol extra strength with some relief. /BP's have been running around 140's/90's) ? ? ?Hypertension:  Now back on  losartan 25 mg daily ( has not restarted amlodipine 5 mg daily yet to simplify regimen) ?BP Readings from Last 3 Encounters:  ?04/19/21 138/86  ?04/05/21 (!) 172/102  ?03/28/21 (!) 182/97  ?Using medication without problems or lightheadedness:  none ?Chest pain with exertion: none ?Edema:none ?Short of breath: none ?Average home BPs: > 150/90 ?Other issues: ? ?Acute flare of chronic low back pain:  recommended home PT at last OV ( reviewed  last OV in detail) ?   ?12/22 Lumbar film showed:  no fracture, multilevel degenerative disc disease. ? Using tylenol for pain. She feels this is working well for her. ? If she uses the tylenol she is please with her level pain of. ? ? She has now started on Mounjaro x 2 weeks. ? FBS running better.  On continuous glucose monitor:  80-170 ? ?Relevant past medical, surgical, family and social history reviewed and updated as indicated. Interim medical history since our last visit reviewed. ?Allergies and medications reviewed and updated. ?Outpatient Medications Prior to Visit  ?Medication Sig Dispense Refill  ? acetaminophen (TYLENOL)  500 MG tablet Take 500 mg by mouth every 6 (six) hours as needed.    ? Cholecalciferol 1.25 MG (50000 UT) capsule Take by mouth once a week. 12 capsule 0  ? Continuous Blood Gluc Receiver (FREESTYLE LIBRE 14 DAY READER) DEVI 1 EACH BY DOES NOT APPLY ROUTE 3 (THREE) TIMES DAILY AS NEEDED. 1 each 0  ? Continuous Blood Gluc Sensor (FREESTYLE LIBRE 14 DAY SENSOR) MISC 1 UNITS BY DOES NOT APPLY ROUTE EVERY 3 (THREE) DAYS AS NEEDED. 4 each 3  ? glucose monitoring kit (FREESTYLE) monitoring kit 1 each by Does not apply route as needed for other. 1 each 0  ? Insulin Pen Needle (PEN NEEDLES) 31G X 8 MM MISC Pen needles for Trulicity, L89.373 50 each 11  ? ketorolac (ACULAR) 0.5 % ophthalmic solution Place 1 drop into the left eye 4 (four) times daily as needed.    ? losartan (COZAAR) 25 MG tablet Take 1 tablet (25 mg total) by mouth daily. 30 tablet 11  ? tirzepatide (MOUNJARO) 2.5 MG/0.5ML Pen Inject 2.5 mg into the skin once a week. 2 mL 3  ? ?No facility-administered medications prior to visit.  ?  ? ?Per HPI unless specifically indicated in ROS section below ?Review of Systems  ?Constitutional:  Negative for fatigue and fever.  ?HENT:  Negative for congestion.   ?Eyes:  Negative for pain.  ?Respiratory:  Negative for cough and shortness of breath.   ?Cardiovascular:  Negative for chest pain, palpitations and leg swelling.  ?Gastrointestinal:  Negative for abdominal pain.  ?Genitourinary:  Negative for dysuria and vaginal bleeding.  ?Musculoskeletal:  Negative for back pain.  ?Neurological:  Negative for syncope, light-headedness and headaches.  ?     " Squeaky feeling" in head when BP up  ?Psychiatric/Behavioral:  Negative for dysphoric mood.   ?Objective:  ?BP 138/86 (BP Location: Left Arm, Patient Position: Sitting, Cuff Size: Normal)   Pulse 81   Temp 98.4 ?F (36.9 ?C) (Oral)   Resp 16   Ht _0  (1.549 m)   Wt 178 lb 6.4 oz (80.9 kg)   SpO2 98%   BMI 33.71 kg/m?   ?Wt Readings from Last 3 Encounters:   ?04/19/21 178 lb 6.4 oz (80.9 kg)  ?04/05/21 181 lb (82.1 kg)  ?03/30/21 181 lb (82.1 kg)  ?  ?  ?Physical Exam ?Constitutional:   ?   General: She is not in acute distress. ?   Appearance: Normal appearance. She is well-developed. She is not ill-appearing or toxic-appearing.  ?HENT:  ?   Head: Normocephalic.  ?   Right Ear: Hearing, tympanic membrane, ear canal and external ear normal. Tympanic membrane is not erythematous, retracted or bulging.  ?   Left Ear: Hearing, tympanic membrane, ear canal and external ear normal. Tympanic membrane is not erythematous, retracted or bulging.  ?   Nose: No mucosal edema or rhinorrhea.  ?   Right Sinus: No maxillary sinus tenderness or frontal sinus tenderness.  ?   Left Sinus: No maxillary sinus tenderness or frontal sinus tenderness.  ?   Mouth/Throat:  ?   Pharynx: Uvula midline.  ?Eyes:  ?   General: Lids are normal. Lids are everted, no foreign bodies appreciated.  ?   Conjunctiva/sclera: Conjunctivae normal.  ?   Pupils: Pupils are equal, round, and reactive to light.  ?Neck:  ?   Thyroid: No thyroid mass or thyromegaly.  ?   Vascular: No carotid bruit.  ?   Trachea: Trachea normal.  ?Cardiovascular:  ?   Rate and Rhythm: Normal rate and regular rhythm.  ?   Pulses: Normal pulses.  ?   Heart sounds: Normal heart sounds, S1 normal and S2 normal. No murmur heard. ?  No friction rub. No gallop.  ?Pulmonary:  ?   Effort: Pulmonary effort is normal. No tachypnea or respiratory distress.  ?   Breath sounds: Normal breath sounds. No decreased breath sounds, wheezing, rhonchi or rales.  ?Abdominal:  ?   General: Bowel sounds are normal.  ?   Palpations: Abdomen is soft.  ?   Tenderness: There is no abdominal tenderness.  ?Musculoskeletal:  ?   Cervical back: Normal range of motion and neck supple.  ?Skin: ?   General: Skin is warm and dry.  ?   Findings: No rash.  ?Neurological:  ?   Mental Status: She is alert.  ?Psychiatric:     ?   Mood and Affect: Mood is not anxious or  depressed.     ?   Speech: Speech normal.     ?   Behavior: Behavior normal. Behavior is cooperative.     ?   Thought Content: Thought content normal.     ?   Judgment: Judgment normal.  ? ?   ?Results for orders placed or performed in visit on 04/05/21  ?POCT glycosylated  hemoglobin (Hb A1C)  ?Result Value Ref Range  ? Hemoglobin A1C 9.2 (A) 4.0 - 5.6 %  ? HbA1c POC (<> result, manual entry)    ? HbA1c, POC (prediabetic range)    ? HbA1c, POC (controlled diabetic range)    ? ? ?This visit occurred during the SARS-CoV-2 public health emergency.  Safety protocols were in place, including screening questions prior to the visit, additional usage of staff PPE, and extensive cleaning of exam room while observing appropriate contact time as indicated for disinfecting solutions.  ? ?COVID 19 screen:  No recent travel or known exposure to [place] ?The patient denies respiratory symptoms of COVID 19 at this time. ?The importance of social distancing was discussed today.  ? ?Assessment and Plan ? ?  ?Problem List Items Addressed This Visit   ? ? Chronic low back pain  ?  Chronic, improved control ? ?She has not yet started home physical therapy but will do this.  She states she is pleased with her level of pain using 2 extra strength Tylenol daily.  I have let her know that if she has a bad day with increased pain she can increase the frequency of this medication to every 8 hours. ?She is not interested in a referral to Ortho or PT at this time. ?  ?  ? Hypertension associated with diabetes ([place]) - Primary  ?  Chronic, improved control but not at goal ? ?Her blood pressure is improving on losartan 25 mg daily.  Given home blood pressures are not at goal with every measurement, we will increase the losartan to 50 mg daily.  No prescription was sent in as she has a lot of 25 mg tablets and will increase the dose of this by taking 2 tabs daily.  She will remain off the amlodipine. ?  ?  ? Relevant Medications  ? losartan  (COZAAR) 50 MG tablet  ? Type 2 diabetes mellitus with neurological complications ([place])  ?  Chronic, improving control but not yet at goal ? ?She is doing well on the [facility].  She has been given a temporary supply and prior Chief Strategy Officer

## 2021-04-19 NOTE — Assessment & Plan Note (Signed)
Chronic, improving control but not yet at goal ? ?She is doing well on the [facility].  She has been given a temporary supply and prior authorization will likely need to be completed for further prescriptions.  Of note she has been intolerant of metformin and Trulicity in the past.  She is tolerating Mounjaro well. ? ?She wishes to follow-up every 2 weeks until her blood sugars are at goal to help with her difficulties with compliance. ?

## 2021-04-19 NOTE — Patient Instructions (Addendum)
Increase losartan to 50 mg daily. ? Follow BP at home... call if B > 140/90. ? Continue Mounjaro at current dose. ? Use tylenol for back pain. ? Increase water. Can try Benefiber .. gradually increase the amount. ? ? ?

## 2021-04-21 ENCOUNTER — Encounter: Payer: Self-pay | Admitting: Family Medicine

## 2021-04-21 NOTE — Telephone Encounter (Signed)
Unable to reach pt by phone and could not leave v/m  due to mailbox is full  and could not reach pt at work because she is not working today.tried multiple times to reach pt without success. Will also my chart pt. Sending note to [place] triage and [name] CMA. ?

## 2021-04-21 NOTE — Telephone Encounter (Signed)
Still unable to reach pt by phone. I spoke with [place] (DPR signed) and she said she had spoken with pt earlier today and pt is at [employer] now. Derl Barrow will have pt call [place] at [phone] ? ?

## 2021-04-21 NOTE — Telephone Encounter (Signed)
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 Pt said that she has not taken the Losartan today. Pt said that she is lightheaded now and the lightheadedness is basically constant; no room spinning. Even though pt has not take the Losartan today she still has blurred vision. No available appts today at [facility] and pt does not want to go to [facility] or ED. Pt said she was going to lay down for a while.  Pt said if she needs to be seen pt could come into office on 04/22/21 early. Dr Diona Browner has same day 8:20 am appt on 04/22/21. Sending note to  Dr Diona Browner who  is out of office, [name] CMA. Will teams [name] also.UC & ED precautions given and pt voiced understanding. Pt request cb today to see if needs to come in early in morning to be seen or can her med be adjusted.  ?

## 2021-04-21 NOTE — Telephone Encounter (Signed)
Pt called back. Soon after taking losartan, she starts to have visual issues/blurred vision. This makes her feel so bad. She has not checked her BP when that happens. Last night reading was 149/102. Having eye injections in left eye for retinopathy.  ?

## 2021-04-21 NOTE — Telephone Encounter (Signed)
Spoke with Patricia Horne and went ahead and scheduled her with Dr. Diona Browner tomorrow at 8:20 am.  ?

## 2021-04-21 NOTE — Telephone Encounter (Signed)
[name] CMA teams me back and said that she cannot OK 8:20 appt (same day appt)with Dr Diona Browner tomorrow until hears from Dr Diona Browner so I will call and let pt know that she will get a call but it could be tomorrow before gets cb and to continue with UC & ED precautions. Pt notified as instructed and voiced understanding. Pt will wait to hear back from Dr Rometta Emery CMA unless condition changes or worsens and pt will got to [facility] or ED. Sending note to DR Diona Browner and [name] CMA.  ?

## 2021-04-22 ENCOUNTER — Encounter: Payer: Self-pay | Admitting: Family Medicine

## 2021-04-22 ENCOUNTER — Ambulatory Visit (INDEPENDENT_AMBULATORY_CARE_PROVIDER_SITE_OTHER): Payer: HMO | Admitting: Family Medicine

## 2021-04-22 ENCOUNTER — Other Ambulatory Visit: Payer: Self-pay

## 2021-04-22 VITALS — BP 158/76 | HR 86 | Ht 61.0 in | Wt 176.0 lb

## 2021-04-22 DIAGNOSIS — H6121 Impacted cerumen, right ear: Secondary | ICD-10-CM

## 2021-04-22 DIAGNOSIS — K582 Mixed irritable bowel syndrome: Secondary | ICD-10-CM

## 2021-04-22 DIAGNOSIS — E1159 Type 2 diabetes mellitus with other circulatory complications: Secondary | ICD-10-CM | POA: Diagnosis not present

## 2021-04-22 DIAGNOSIS — I152 Hypertension secondary to endocrine disorders: Secondary | ICD-10-CM | POA: Diagnosis not present

## 2021-04-22 DIAGNOSIS — E13319 Other specified diabetes mellitus with unspecified diabetic retinopathy without macular edema: Secondary | ICD-10-CM | POA: Diagnosis not present

## 2021-04-22 DIAGNOSIS — H539 Unspecified visual disturbance: Secondary | ICD-10-CM | POA: Diagnosis not present

## 2021-04-22 NOTE — Assessment & Plan Note (Signed)
Chronic inadequate control ? ?Doubt side effect of losartan.  No sign of hypotension.  Will increase losartan to 75 mg daily given she has 25 mg tablets.  If blood pressure still not well controlled at next office visit we will increase to 100 mg daily ?

## 2021-04-22 NOTE — Assessment & Plan Note (Signed)
Chronic, poor control ? ?Currently at the constipation side of the spectrum.  I have asked her to start MiraLAX daily to twice daily.  She has started [employer].  I asked her to limit Cascara use given it is a stimulant. ?

## 2021-04-22 NOTE — Patient Instructions (Addendum)
Keep eye  doctor appointment... if you are able send me a message regarding their evaluation. ? Increase losartan to 75 mg daily ( 3 tablets of 25 mg)... then at follow up we will plan on increasing to 100 mg if BP is not at goal. ? Keep follow up as planned. ?

## 2021-04-22 NOTE — Assessment & Plan Note (Signed)
Acute, ? ?Not clearly a side effect of the medication.  The vision change could be due to persistently elevated blood pressure.  She has upcoming eye MD visit for full exam.  She will discuss whether retinopathy could be progressing and causing current symptoms. ?No signs and symptoms of allergies as etiology. ?

## 2021-04-22 NOTE — Assessment & Plan Note (Signed)
Simple bilateral ear irrigation with warm water performed by  Vicente Males, CMA, Consent obtained verbally.  ?No complications.  Pt tolerated procedure well.  ?Resolved impaction and resolved otitis externa on exam. ?Reviewed prevention of cerumen impaction, information provided. ?

## 2021-04-22 NOTE — Progress Notes (Signed)
? ? Patient ID: Patricia Horne, female    DOB: 1950-09-30, 71 y.o.   MRN: [id number] ? ?This visit was conducted in person. ? ?BP (!) 158/76 (BP Location: Left Arm, Patient Position: Sitting, Cuff Size: Large)   Pulse 86   Ht 5' 1" (1.549 m)   Wt 176 lb (79.8 kg)   SpO2 95%   BMI 33.25 kg/m?   ? ?CC:  ?Chief Complaint  ?Patient presents with  ? Eye Problem  ?  Pt stated--having eye fog-1 month. Denied pain, dizziness.  ? ? ?Subjective:  ? ?HPI: ?Patricia Horne is a 71 y.o. female presenting on 04/22/2021 for Eye Problem (Pt stated--having eye fog-1 month. Denied pain, dizziness.) ? ?  In the last month since she started back the losartan, she has noted a sensation of foggy vision, no dizziness or lightheadedness.  No eye pain, no fever, no itchy eyes no watery eyes. ?She wonders if it could be a side effect from the losartan, alternatively she was worried that her blood pressure was dropping too low but it has actually been staying too high. ? ? She has issues with  retinopathy right eye. Last eye MD visit  1 month ago... have a appt 3/29 with eye MD. Dr. Edison Pace at [facility] eye.  Per patient they plan on doing a injection in the eye at that visit. ? No eye drops. ? ? FBS have been improving  on Mounjaro, no < 60, occ > 200 ?   ? At home BP 196/104 ?BP Readings from Last 3 Encounters:  ?04/22/21 (!) 158/76  ?04/19/21 138/86  ?04/05/21 (!) 172/102  ? ?Right ear has been feeling full, muffled hearing ? ?Relevant past medical, surgical, family and social history reviewed and updated as indicated. Interim medical history since our last visit reviewed. ?Allergies and medications reviewed and updated. ?Outpatient Medications Prior to Visit  ?Medication Sig Dispense Refill  ? acetaminophen (TYLENOL) 500 MG tablet Take 500 mg by mouth every 6 (six) hours as needed.    ? Cholecalciferol 1.25 MG (50000 UT) capsule Take by mouth once a week. 12 capsule 0  ? Continuous Blood Gluc Receiver (FREESTYLE LIBRE 14 DAY READER)  DEVI 1 EACH BY DOES NOT APPLY ROUTE 3 (THREE) TIMES DAILY AS NEEDED. 1 each 0  ? Continuous Blood Gluc Sensor (FREESTYLE LIBRE 14 DAY SENSOR) MISC 1 UNITS BY DOES NOT APPLY ROUTE EVERY 3 (THREE) DAYS AS NEEDED. 4 each 3  ? glucose monitoring kit (FREESTYLE) monitoring kit 1 each by Does not apply route as needed for other. 1 each 0  ? Insulin Pen Needle (PEN NEEDLES) 31G X 8 MM MISC Pen needles for Trulicity, E09.233 50 each 11  ? ketorolac (ACULAR) 0.5 % ophthalmic solution Place 1 drop into the left eye 4 (four) times daily as needed.    ? losartan (COZAAR) 50 MG tablet Take 1 tablet (50 mg total) by mouth daily. 30 tablet 11  ? tirzepatide (MOUNJARO) 2.5 MG/0.5ML Pen Inject 2.5 mg into the skin once a week. 2 mL 3  ? ?No facility-administered medications prior to visit.  ?  ? ?Per HPI unless specifically indicated in ROS section below ?Review of Systems  ?Constitutional:  Negative for fatigue and fever.  ?HENT:  Negative for ear pain.   ?Eyes:  Negative for pain.  ?Respiratory:  Negative for chest tightness and shortness of breath.   ?Cardiovascular:  Negative for chest pain, palpitations and leg swelling.  ?Gastrointestinal:  Negative for abdominal  pain.  ?Genitourinary:  Negative for dysuria.  ?Objective:  ?BP (!) 158/76 (BP Location: Left Arm, Patient Position: Sitting, Cuff Size: Large)   Pulse 86   Ht 5' 1" (1.549 m)   Wt 176 lb (79.8 kg)   SpO2 95%   BMI 33.25 kg/m?   ?Wt Readings from Last 3 Encounters:  ?04/22/21 176 lb (79.8 kg)  ?04/19/21 178 lb 6.4 oz (80.9 kg)  ?04/05/21 181 lb (82.1 kg)  ?  ?  ?Physical Exam ?Constitutional:   ?   General: She is not in acute distress. ?   Appearance: Normal appearance. She is well-developed. She is not ill-appearing or toxic-appearing.  ?HENT:  ?   Head: Normocephalic.  ?   Right Ear: Hearing, tympanic membrane, ear canal and external ear normal. There is impacted cerumen. Tympanic membrane is not erythematous, retracted or bulging.  ?   Left Ear: Hearing,  tympanic membrane, ear canal and external ear normal.  No middle ear effusion. Tympanic membrane is not erythematous, retracted or bulging.  ?   Nose: No mucosal edema or rhinorrhea.  ?   Right Sinus: No maxillary sinus tenderness or frontal sinus tenderness.  ?   Left Sinus: No maxillary sinus tenderness or frontal sinus tenderness.  ?   Mouth/Throat:  ?   Pharynx: Uvula midline.  ?Eyes:  ?   General: Lids are normal. Lids are everted, no foreign bodies appreciated.  ?   Conjunctiva/sclera: Conjunctivae normal.  ?   Pupils: Pupils are equal, round, and reactive to light.  ?Neck:  ?   Thyroid: No thyroid mass or thyromegaly.  ?   Vascular: No carotid bruit.  ?   Trachea: Trachea normal.  ?Cardiovascular:  ?   Rate and Rhythm: Normal rate and regular rhythm.  ?   Pulses: Normal pulses.  ?   Heart sounds: Normal heart sounds, S1 normal and S2 normal. No murmur heard. ?  No friction rub. No gallop.  ?Pulmonary:  ?   Effort: Pulmonary effort is normal. No tachypnea or respiratory distress.  ?   Breath sounds: Normal breath sounds. No decreased breath sounds, wheezing, rhonchi or rales.  ?Abdominal:  ?   General: Bowel sounds are normal.  ?   Palpations: Abdomen is soft.  ?   Tenderness: There is no abdominal tenderness.  ?Musculoskeletal:  ?   Cervical back: Normal range of motion and neck supple.  ?Skin: ?   General: Skin is warm and dry.  ?   Findings: No rash.  ?Neurological:  ?   Mental Status: She is alert.  ?Psychiatric:     ?   Mood and Affect: Mood is not anxious or depressed.     ?   Speech: Speech normal.     ?   Behavior: Behavior normal. Behavior is cooperative.     ?   Thought Content: Thought content normal.     ?   Judgment: Judgment normal.  ? ?   ?Results for orders placed or performed in visit on 04/05/21  ?POCT glycosylated hemoglobin (Hb A1C)  ?Result Value Ref Range  ? Hemoglobin A1C 9.2 (A) 4.0 - 5.6 %  ? HbA1c POC (<> result, manual entry)    ? HbA1c, POC (prediabetic range)    ? HbA1c, POC  (controlled diabetic range)    ? ? ?This visit occurred during the SARS-CoV-2 public health emergency.  Safety protocols were in place, including screening questions prior to the visit, additional usage of staff PPE, and  extensive cleaning of exam room while observing appropriate contact time as indicated for disinfecting solutions.  ? ?COVID 19 screen:  No recent travel or known exposure to [place] ?The patient denies respiratory symptoms of COVID 19 at this time. ?The importance of social distancing was discussed today.  ? ?Assessment and Plan ?Problem List Items Addressed This Visit   ? ? Change in vision  ?  Acute, ? ?Not clearly a side effect of the medication.  The vision change could be due to persistently elevated blood pressure.  She has upcoming eye MD visit for full exam.  She will discuss whether retinopathy could be progressing and causing current symptoms. ?No signs and symptoms of allergies as etiology. ?  ?  ? Diabetic retinopathy ([place])  ?  Unclear if this is contributing to her new vision changes.  I have asked her to keep the ophthalmology appointment as planned in 5 days. ?  ?  ? Hearing loss of right ear due to cerumen impaction - Primary  ?  Simple bilateral ear irrigation with warm water performed by  Vicente Males, CMA, Consent obtained verbally.  ?No complications.  Pt tolerated procedure well.  ?Resolved impaction and resolved otitis externa on exam. ?Reviewed prevention of cerumen impaction, information provided. ?  ?  ? Hypertension associated with diabetes ([place])  ?  Chronic inadequate control ? ?Doubt side effect of losartan.  No sign of hypotension.  Will increase losartan to 75 mg daily given she has 25 mg tablets.  If blood pressure still not well controlled at next office visit we will increase to 100 mg daily ?  ?  ? IBS (irritable bowel syndrome)  ?  Chronic, poor control ? ?Currently at the constipation side of the spectrum.  I have asked her to start MiraLAX daily to twice daily.  She has  started [employer].  I asked her to limit Cascara use given it is a stimulant. ?  ?  ? ? ?  ? ?Eliezer Lofts, MD  ? ?

## 2021-04-22 NOTE — Assessment & Plan Note (Signed)
Unclear if this is contributing to her new vision changes.  I have asked her to keep the ophthalmology appointment as planned in 5 days. ?

## 2021-04-27 DIAGNOSIS — E113312 Type 2 diabetes mellitus with moderate nonproliferative diabetic retinopathy with macular edema, left eye: Secondary | ICD-10-CM | POA: Diagnosis not present

## 2021-04-29 ENCOUNTER — Encounter: Payer: Self-pay | Admitting: Family Medicine

## 2021-05-02 ENCOUNTER — Other Ambulatory Visit: Payer: Self-pay

## 2021-05-03 ENCOUNTER — Ambulatory Visit (INDEPENDENT_AMBULATORY_CARE_PROVIDER_SITE_OTHER): Payer: HMO | Admitting: Family Medicine

## 2021-05-03 ENCOUNTER — Encounter: Payer: Self-pay | Admitting: Family Medicine

## 2021-05-03 ENCOUNTER — Other Ambulatory Visit: Payer: Self-pay

## 2021-05-03 VITALS — BP 195/81 | HR 78 | Temp 98.3°F | Ht 61.0 in | Wt 177.4 lb

## 2021-05-03 DIAGNOSIS — I152 Hypertension secondary to endocrine disorders: Secondary | ICD-10-CM

## 2021-05-03 DIAGNOSIS — E13319 Other specified diabetes mellitus with unspecified diabetic retinopathy without macular edema: Secondary | ICD-10-CM

## 2021-05-03 DIAGNOSIS — E1159 Type 2 diabetes mellitus with other circulatory complications: Secondary | ICD-10-CM | POA: Diagnosis not present

## 2021-05-03 DIAGNOSIS — E1149 Type 2 diabetes mellitus with other diabetic neurological complication: Secondary | ICD-10-CM

## 2021-05-03 MED ORDER — AMLODIPINE BESYLATE 10 MG PO TABS
10.0000 mg | ORAL_TABLET | Freq: Every day | ORAL | 3 refills | Status: DC
  Filled 2021-05-03: qty 90, 90d supply, fill #0
  Filled 2021-09-02: qty 90, 90d supply, fill #1

## 2021-05-03 NOTE — Progress Notes (Signed)
? ? Patient ID: Patricia Horne, female    DOB: [birth year], 71 y.o.   MRN: [id number] ? ?This visit was conducted in person. ? ?BP (!) 195/81 Comment: Patient's Home BP Monitor  Pulse 78   Temp 98.3 ?F (36.8 ?C) (Oral)   Ht 5' 1"  (1.549 m)   Wt 177 lb 7 oz (80.5 kg)   PF 98 L/min   BMI 33.53 kg/m?   ? ?CC:  ?Chief Complaint  ?Patient presents with  ? Diabetes  ? ? ?Subjective:  ? ?HPI: ?Patricia Horne is a 71 y.o. female presenting on 05/03/2021 for Diabetes ? ? ?Hypertension:   BP remains poorly controlled on losartan 75 mg daily. ?We evaluated her home meter compared to ours with the same result ?BP Readings from Last 3 Encounters:  ?05/03/21 (!) 195/81  ?04/22/21 (!) 158/76  ?04/19/21 138/86  ?Using medication without problems or lightheadedness:  none ?Chest pain with exertion: none ?Edema: none ?Short of breath: none ?Average home BPs: ?Other issues: ? ? ?Diabetes:   recent FBS after starting Mounjaro 2.5 mg weekly ? She has been eating smaller meals. ?Using medications without difficulties: ?Hypoglycemic episodes;  once when skipped a meal 52.. came up with treating. ?Hyperglycemic episodes: none ?Feet problems: ?Blood Sugars averaging: FBS 106-120,  postprandial 156 ?eye exam within last year: recent injections in eyes for diabetic retinopathy ? ?   ?Wt Readings from Last 3 Encounters:  ?05/03/21 177 lb 7 oz (80.5 kg)  ?04/22/21 176 lb (79.8 kg)  ?04/19/21 178 lb 6.4 oz (80.9 kg)  ? ? ? ?Relevant past medical, surgical, family and social history reviewed and updated as indicated. Interim medical history since our last visit reviewed. ?Allergies and medications reviewed and updated. ?Outpatient Medications Prior to Visit  ?Medication Sig Dispense Refill  ? acetaminophen (TYLENOL) 500 MG tablet Take 500 mg by mouth every 6 (six) hours as needed.    ? Cholecalciferol 1.25 MG (50000 UT) capsule Take by mouth once a week. 12 capsule 0  ? Continuous Blood Gluc Receiver (FREESTYLE LIBRE 14 DAY READER) DEVI 1  EACH BY DOES NOT APPLY ROUTE 3 (THREE) TIMES DAILY AS NEEDED. 1 each 0  ? Continuous Blood Gluc Sensor (FREESTYLE LIBRE 14 DAY SENSOR) MISC 1 UNITS BY DOES NOT APPLY ROUTE EVERY 3 (THREE) DAYS AS NEEDED. 4 each 3  ? glucose monitoring kit (FREESTYLE) monitoring kit 1 each by Does not apply route as needed for other. 1 each 0  ? Insulin Pen Needle (PEN NEEDLES) 31G X 8 MM MISC Pen needles for Trulicity, O87.867 50 each 11  ? ketorolac (ACULAR) 0.5 % ophthalmic solution Place 1 drop into the left eye 4 (four) times daily as needed.    ? losartan (COZAAR) 25 MG tablet     ? losartan (COZAAR) 50 MG tablet Take 1 tablet (50 mg total) by mouth daily. 30 tablet 11  ? tirzepatide (MOUNJARO) 2.5 MG/0.5ML Pen Inject 2.5 mg into the skin once a week. 2 mL 3  ? ?No facility-administered medications prior to visit.  ?  ? ?Per HPI unless specifically indicated in ROS section below ?Review of Systems  ?Constitutional:  Negative for fatigue and fever.  ?HENT:  Negative for congestion and ear pain.   ?Eyes:  Negative for pain.  ?Respiratory:  Negative for cough, chest tightness and shortness of breath.   ?Cardiovascular:  Negative for chest pain, palpitations and leg swelling.  ?Gastrointestinal:  Negative for abdominal pain.  ?Genitourinary:  Negative for  dysuria and vaginal bleeding.  ?Musculoskeletal:  Negative for back pain.  ?Neurological:  Negative for syncope, light-headedness and headaches.  ?Psychiatric/Behavioral:  Negative for dysphoric mood.   ?Objective:  ?BP (!) 195/81 Comment: Patient's Home BP Monitor  Pulse 78   Temp 98.3 ?F (36.8 ?C) (Oral)   Ht 5' 1"  (1.549 m)   Wt 177 lb 7 oz (80.5 kg)   PF 98 L/min   BMI 33.53 kg/m?   ?Wt Readings from Last 3 Encounters:  ?05/03/21 177 lb 7 oz (80.5 kg)  ?04/22/21 176 lb (79.8 kg)  ?04/19/21 178 lb 6.4 oz (80.9 kg)  ?  ?  ?Physical Exam ?Constitutional:   ?   General: She is not in acute distress. ?   Appearance: Normal appearance. She is well-developed. She is not  ill-appearing or toxic-appearing.  ?HENT:  ?   Head: Normocephalic.  ?   Right Ear: Hearing, tympanic membrane, ear canal and external ear normal. Tympanic membrane is not erythematous, retracted or bulging.  ?   Left Ear: Hearing, tympanic membrane, ear canal and external ear normal. Tympanic membrane is not erythematous, retracted or bulging.  ?   Nose: No mucosal edema or rhinorrhea.  ?   Right Sinus: No maxillary sinus tenderness or frontal sinus tenderness.  ?   Left Sinus: No maxillary sinus tenderness or frontal sinus tenderness.  ?   Mouth/Throat:  ?   Pharynx: Uvula midline.  ?Eyes:  ?   General: Lids are normal. Lids are everted, no foreign bodies appreciated.  ?   Conjunctiva/sclera: Conjunctivae normal.  ?   Pupils: Pupils are equal, round, and reactive to light.  ?Neck:  ?   Thyroid: No thyroid mass or thyromegaly.  ?   Vascular: No carotid bruit.  ?   Trachea: Trachea normal.  ?Cardiovascular:  ?   Rate and Rhythm: Normal rate and regular rhythm.  ?   Pulses: Normal pulses.  ?   Heart sounds: Normal heart sounds, S1 normal and S2 normal. No murmur heard. ?  No friction rub. No gallop.  ?Pulmonary:  ?   Effort: Pulmonary effort is normal. No tachypnea or respiratory distress.  ?   Breath sounds: Normal breath sounds. No decreased breath sounds, wheezing, rhonchi or rales.  ?Abdominal:  ?   General: Bowel sounds are normal.  ?   Palpations: Abdomen is soft.  ?   Tenderness: There is no abdominal tenderness.  ?Musculoskeletal:  ?   Cervical back: Normal range of motion and neck supple.  ?Skin: ?   General: Skin is warm and dry.  ?   Findings: No rash.  ?Neurological:  ?   Mental Status: She is alert.  ?Psychiatric:     ?   Mood and Affect: Mood is not anxious or depressed.     ?   Speech: Speech normal.     ?   Behavior: Behavior normal. Behavior is cooperative.     ?   Thought Content: Thought content normal.     ?   Judgment: Judgment normal.  ? ?   ?Results for orders placed or performed in visit on  04/05/21  ?POCT glycosylated hemoglobin (Hb A1C)  ?Result Value Ref Range  ? Hemoglobin A1C 9.2 (A) 4.0 - 5.6 %  ? HbA1c POC (<> result, manual entry)    ? HbA1c, POC (prediabetic range)    ? HbA1c, POC (controlled diabetic range)    ? ? ?This visit occurred during the SARS-CoV-2 public health emergency.  Safety protocols were in place, including screening questions prior to the visit, additional usage of staff PPE, and extensive cleaning of exam room while observing appropriate contact time as indicated for disinfecting solutions.  ? ?COVID 19 screen:  No recent travel or known exposure to [place] ?The patient denies respiratory symptoms of COVID 19 at this time. ?The importance of social distancing was discussed today.  ? ?Assessment and Plan ? ?  ?Problem List Items Addressed This Visit   ? ? Diabetic retinopathy ([place])  ? Hypertension associated with diabetes ([place])  ?  Chronic, poor control ? ?Unfortunately trying to increase losartan in an effort to use 1 medication to simplify her regimen has not been as successful as using both losartan 50 mg and amlodipine 10 mg.  We will change back to her previous regimen of losartan 50 mg daily and amlodipine 10 mg daily.  Patient is agreeable she will ? ?  ?  ? Relevant Medications  ? amLODipine (NORVASC) 10 MG tablet  ? 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  Chronic, improving now on Mounjaro 2.5 mg weekly.  We will continue this dose ? ?  ?  ? ?Meds ordered this encounter  ?Medications  ? amLODipine (NORVASC) 10 MG tablet  ?  Sig: Take 1 tablet (10 mg total) by mouth daily.  ?  Dispense:  90 tablet  ?  Refill:  3  ? ? ? ?Eliezer Lofts, MD  ? ?

## 2021-05-03 NOTE — Assessment & Plan Note (Signed)
Chronic, poor control ? ?Unfortunately trying to increase losartan in an effort to use 1 medication to simplify her regimen has not been as successful as using both losartan 50 mg and amlodipine 10 mg.  We will change back to her previous regimen of losartan 50 mg daily and amlodipine 10 mg daily.  Patient is agreeable she will ?

## 2021-05-03 NOTE — Patient Instructions (Addendum)
Change BP regimen to losartan 50 mg daily AND amlodipine 10 mg daily as this worked well in the past. ? Follow BPs at home.Marland Kitchen goal < 140/90 ? ? Keep up the great work on healthy low carb diet. ?

## 2021-05-09 ENCOUNTER — Other Ambulatory Visit: Payer: Self-pay

## 2021-05-25 DIAGNOSIS — E113312 Type 2 diabetes mellitus with moderate nonproliferative diabetic retinopathy with macular edema, left eye: Secondary | ICD-10-CM | POA: Diagnosis not present

## 2021-05-30 ENCOUNTER — Other Ambulatory Visit: Payer: Self-pay

## 2021-05-31 ENCOUNTER — Ambulatory Visit (INDEPENDENT_AMBULATORY_CARE_PROVIDER_SITE_OTHER): Payer: HMO | Admitting: Family Medicine

## 2021-05-31 ENCOUNTER — Ambulatory Visit: Payer: HMO | Admitting: Gastroenterology

## 2021-05-31 ENCOUNTER — Other Ambulatory Visit: Payer: Self-pay

## 2021-05-31 VITALS — BP 148/86 | HR 94 | Temp 98.3°F | Ht 61.0 in | Wt 177.1 lb

## 2021-05-31 DIAGNOSIS — I152 Hypertension secondary to endocrine disorders: Secondary | ICD-10-CM

## 2021-05-31 DIAGNOSIS — E1159 Type 2 diabetes mellitus with other circulatory complications: Secondary | ICD-10-CM | POA: Diagnosis not present

## 2021-05-31 DIAGNOSIS — E1149 Type 2 diabetes mellitus with other diabetic neurological complication: Secondary | ICD-10-CM

## 2021-05-31 DIAGNOSIS — K582 Mixed irritable bowel syndrome: Secondary | ICD-10-CM

## 2021-05-31 DIAGNOSIS — K219 Gastro-esophageal reflux disease without esophagitis: Secondary | ICD-10-CM | POA: Diagnosis not present

## 2021-05-31 NOTE — Patient Instructions (Addendum)
Keep up with water intake. ?Continue current medication. ?Keep appt with Gi for chronic constipation. ?Avoid acidic foods and reflux triggers.. Use pepcid AC as needed. If it s happening frequently then start a 4-6 week course of nexium 20 mg daily. ?

## 2021-05-31 NOTE — Assessment & Plan Note (Signed)
Keep appt with Gi for chronic constipation. ?

## 2021-05-31 NOTE — Assessment & Plan Note (Signed)
Chronic, improving control  ? ? Continue Mounjaro at current dose as it is controlling sugars and is possible worsening constiaption/reflux. ?

## 2021-05-31 NOTE — Progress Notes (Signed)
? ? Patient ID: Patricia Horne, female    DOB: March 04, 1950, 71 y.o.   MRN: [id number] ? ?This visit was conducted in person. ? ?BP (!) 148/86   Pulse 94   Temp 98.3 ?F (36.8 ?C) (Oral)   Ht _0  (1.549 m)   Wt 177 lb 1 oz (80.3 kg)   SpO2 97%   BMI 33.46 kg/m?   ? ?CC:  ?Chief Complaint  ?Patient presents with  ? Diabetes  ? Hypertension  ? ? ?Subjective:  ? ?HPI: ?Patricia Horne is a 71 y.o. female presenting on 05/31/2021 for Diabetes and Hypertension ? ?Hypertension:   Improved with addition back of amlodipine  10 mg to losartan 50 mg daily. ? She has been compliant with these meds.   ?BP Readings from Last 3 Encounters:  ?05/31/21 (!) 148/86  ?05/03/21 (!) 195/81  ?04/22/21 (!) 158/76  ?Using medication without problems or lightheadedness:  none ?Chest pain with exertion: none ?Edema: none ?Short of breath: none ?Average home BPs: 138/86 ?Other issues: ? ?  Diabetes:  On Mounjaro ? Intolerant of metformin and Trulicity in the past ?Using medications without difficulties: ?Hypoglycemic episodes: none ?Hyperglycemic episodes: ?Feet problems: none ?Blood Sugars averaging: FBS 85-108, occ 188 after eating ?eye exam within last year: ?Lab Results  ?Component Value Date  ? HGBA1C 9.2 (A) 04/05/2021  ? ? Having some heartburn depending on what she eating.Marland Kitchen 2-3 days a week. ?Wt Readings from Last 3 Encounters:  ?05/31/21 177 lb 1 oz (80.3 kg)  ?05/03/21 177 lb 7 oz (80.5 kg)  ?04/22/21 176 lb (79.8 kg)  ? ? Still having issues with constipation.Marland Kitchen Has been using water and fiber, Miralax daily ? Plans to try Mg citrate today. ?Has appt May 23 with GI. ? ?Relevant past medical, surgical, family and social history reviewed and updated as indicated. Interim medical history since our last visit reviewed. ?Allergies and medications reviewed and updated. ?Outpatient Medications Prior to Visit  ?Medication Sig Dispense Refill  ? acetaminophen (TYLENOL) 500 MG tablet Take 500 mg by mouth every 6 (six) hours as needed.     ? amLODipine (NORVASC) 10 MG tablet Take 1 tablet (10 mg total) by mouth daily. 90 tablet 3  ? Cholecalciferol 1.25 MG (50000 UT) capsule Take by mouth once a week. 12 capsule 0  ? Continuous Blood Gluc Receiver (FREESTYLE LIBRE 14 DAY READER) DEVI 1 EACH BY DOES NOT APPLY ROUTE 3 (THREE) TIMES DAILY AS NEEDED. 1 each 0  ? Continuous Blood Gluc Sensor (FREESTYLE LIBRE 14 DAY SENSOR) MISC 1 UNITS BY DOES NOT APPLY ROUTE EVERY 3 (THREE) DAYS AS NEEDED. 4 each 3  ? glucose monitoring kit (FREESTYLE) monitoring kit 1 each by Does not apply route as needed for other. 1 each 0  ? ketorolac (ACULAR) 0.5 % ophthalmic solution Place 1 drop into the left eye 4 (four) times daily as needed.    ? losartan (COZAAR) 50 MG tablet Take 1 tablet (50 mg total) by mouth daily. 30 tablet 11  ? tirzepatide (MOUNJARO) 2.5 MG/0.5ML Pen Inject 2.5 mg into the skin once a week. 2 mL 3  ? Insulin Pen Needle (PEN NEEDLES) 31G X 8 MM MISC Pen needles for Trulicity, B34.193 50 each 11  ? ?No facility-administered medications prior to visit.  ?  ? ?Per HPI unless specifically indicated in ROS section below ?Review of Systems  ?Constitutional:  Negative for fatigue and fever.  ?HENT:  Negative for ear pain.   ?Eyes:  Negative for pain.  ?Respiratory:  Negative for chest tightness and shortness of breath.   ?Cardiovascular:  Negative for chest pain, palpitations and leg swelling.  ?Gastrointestinal:  Negative for abdominal pain.  ?Genitourinary:  Negative for dysuria.  ?Objective:  ?BP (!) 148/86   Pulse 94   Temp 98.3 ?F (36.8 ?C) (Oral)   Ht _0  (1.549 m)   Wt 177 lb 1 oz (80.3 kg)   SpO2 97%   BMI 33.46 kg/m?   ?Wt Readings from Last 3 Encounters:  ?05/31/21 177 lb 1 oz (80.3 kg)  ?05/03/21 177 lb 7 oz (80.5 kg)  ?04/22/21 176 lb (79.8 kg)  ?  ?  ?Physical Exam ?Constitutional:   ?   General: She is not in acute distress. ?   Appearance: Normal appearance. She is well-developed. She is not ill-appearing or toxic-appearing.  ?HENT:  ?    Head: Normocephalic.  ?   Right Ear: Hearing, tympanic membrane, ear canal and external ear normal. Tympanic membrane is not erythematous, retracted or bulging.  ?   Left Ear: Hearing, tympanic membrane, ear canal and external ear normal. Tympanic membrane is not erythematous, retracted or bulging.  ?   Nose: No mucosal edema or rhinorrhea.  ?   Right Sinus: No maxillary sinus tenderness or frontal sinus tenderness.  ?   Left Sinus: No maxillary sinus tenderness or frontal sinus tenderness.  ?   Mouth/Throat:  ?   Pharynx: Uvula midline.  ?Eyes:  ?   General: Lids are normal. Lids are everted, no foreign bodies appreciated.  ?   Conjunctiva/sclera: Conjunctivae normal.  ?   Pupils: Pupils are equal, round, and reactive to light.  ?Neck:  ?   Thyroid: No thyroid mass or thyromegaly.  ?   Vascular: No carotid bruit.  ?   Trachea: Trachea normal.  ?Cardiovascular:  ?   Rate and Rhythm: Normal rate and regular rhythm.  ?   Pulses: Normal pulses.  ?   Heart sounds: Normal heart sounds, S1 normal and S2 normal. No murmur heard. ?  No friction rub. No gallop.  ?Pulmonary:  ?   Effort: Pulmonary effort is normal. No tachypnea or respiratory distress.  ?   Breath sounds: Normal breath sounds. No decreased breath sounds, wheezing, rhonchi or rales.  ?Abdominal:  ?   General: Bowel sounds are normal.  ?   Palpations: Abdomen is soft.  ?   Tenderness: There is no abdominal tenderness.  ?Musculoskeletal:  ?   Cervical back: Normal range of motion and neck supple.  ?Skin: ?   General: Skin is warm and dry.  ?   Findings: No rash.  ?Neurological:  ?   Mental Status: She is alert.  ?Psychiatric:     ?   Mood and Affect: Mood is not anxious or depressed.     ?   Speech: Speech normal.     ?   Behavior: Behavior normal. Behavior is cooperative.     ?   Thought Content: Thought content normal.     ?   Judgment: Judgment normal.  ? ?   ?Results for orders placed or performed in visit on 04/05/21  ?POCT glycosylated hemoglobin (Hb A1C)   ?Result Value Ref Range  ? Hemoglobin A1C 9.2 (A) 4.0 - 5.6 %  ? HbA1c POC (<> result, manual entry)    ? HbA1c, POC (prediabetic range)    ? HbA1c, POC (controlled diabetic range)    ? ? ?This visit occurred  during the SARS-CoV-2 public health emergency.  Safety protocols were in place, including screening questions prior to the visit, additional usage of staff PPE, and extensive cleaning of exam room while observing appropriate contact time as indicated for disinfecting solutions.  ? ?COVID 19 screen:  No recent travel or known exposure to [place] ?The patient denies respiratory symptoms of COVID 19 at this time. ?The importance of social distancing was discussed today.  ? ?Assessment and Plan ? ?  ?Problem List Items Addressed This Visit   ? ? Gastroesophageal reflux disease  ?    Acute on chronic ? ? ?Avoid acidic foods and reflux triggers.. Use pepcid AC as needed. If it s happening frequently then start a 4-6 week course of nexium 20 mg daily. ?  ?  ? Hypertension associated with diabetes ([place]) - Primary  ?  Stable, chronic.  Continue current medication... improved control on amlodipine 10 mg daily and losartan 50 mg daily. ? ? ? ?  ?  ? IBS (irritable bowel syndrome)  ?  Keep appt with Gi for chronic constipation. ?  ?  ? Type 2 diabetes mellitus with neurological complications ([place])  ?  Chronic, improving control  ? ? Continue Mounjaro at current dose as it is controlling sugars and is possible worsening constiaption/reflux. ? ?  ?  ? ? ? ?Eliezer Lofts, MD  ? ? ?

## 2021-05-31 NOTE — Assessment & Plan Note (Signed)
Acute on chronic ? ? ?Avoid acidic foods and reflux triggers.. Use pepcid AC as needed. If it s happening frequently then start a 4-6 week course of nexium 20 mg daily. ?

## 2021-05-31 NOTE — Assessment & Plan Note (Signed)
Stable, chronic.  Continue current medication... improved control on amlodipine 10 mg daily and losartan 50 mg daily. ? ? ? ?

## 2021-06-02 NOTE — Assessment & Plan Note (Signed)
Chronic, improving now on Mounjaro 2.5 mg weekly.  We will continue this dose ?

## 2021-06-15 ENCOUNTER — Telehealth: Payer: Self-pay | Admitting: Family Medicine

## 2021-06-15 DIAGNOSIS — E1149 Type 2 diabetes mellitus with other diabetic neurological complication: Secondary | ICD-10-CM

## 2021-06-15 NOTE — Telephone Encounter (Signed)
-----   Message from Velna Hatchet, RT sent at 06/06/2021  4:38 PM EDT ----- ?Regarding: Lab Fri 06/24/21 ?Lab order needed for appt, please.  Patient has follow up office visit on 06/28/21.  Thanks,  Anda Kraft ? ?

## 2021-06-21 DIAGNOSIS — K5900 Constipation, unspecified: Secondary | ICD-10-CM | POA: Diagnosis not present

## 2021-06-21 DIAGNOSIS — R12 Heartburn: Secondary | ICD-10-CM | POA: Diagnosis not present

## 2021-06-24 ENCOUNTER — Other Ambulatory Visit (INDEPENDENT_AMBULATORY_CARE_PROVIDER_SITE_OTHER): Payer: HMO

## 2021-06-24 DIAGNOSIS — E1149 Type 2 diabetes mellitus with other diabetic neurological complication: Secondary | ICD-10-CM

## 2021-06-24 LAB — COMPREHENSIVE METABOLIC PANEL
ALT: 9 U/L (ref 0–35)
AST: 13 U/L (ref 0–37)
Albumin: 3.7 g/dL (ref 3.5–5.2)
Alkaline Phosphatase: 70 U/L (ref 39–117)
BUN: 34 mg/dL — ABNORMAL HIGH (ref 6–23)
CO2: 27 mEq/L (ref 19–32)
Calcium: 9.5 mg/dL (ref 8.4–10.5)
Chloride: 107 mEq/L (ref 96–112)
Creatinine, Ser: 1 mg/dL (ref 0.40–1.20)
GFR: 56.95 mL/min — ABNORMAL LOW (ref >60.00)
Glucose, Bld: 115 mg/dL — ABNORMAL HIGH (ref 70–99)
Potassium: 4.6 mEq/L (ref 3.5–5.1)
Sodium: 140 mEq/L (ref 135–145)
Total Bilirubin: 0.6 mg/dL (ref 0.2–1.2)
Total Protein: 7.5 g/dL (ref 6.0–8.3)

## 2021-06-24 LAB — LIPID PANEL
Cholesterol: 163 mg/dL (ref 0–200)
HDL: 46.6 mg/dL (ref >39.00)
LDL Cholesterol: 96 mg/dL (ref 0–99)
NonHDL: 116.09
Total CHOL/HDL Ratio: 3
Triglycerides: 99 mg/dL (ref 0.0–149.0)
VLDL: 19.8 mg/dL (ref 0.0–40.0)

## 2021-06-24 LAB — HEMOGLOBIN A1C: Hgb A1c MFr Bld: 7 % — ABNORMAL HIGH (ref 4.6–6.5)

## 2021-06-24 NOTE — Progress Notes (Signed)
No critical labs need to be addressed urgently. We will discuss labs in detail at upcoming office visit.   

## 2021-06-28 ENCOUNTER — Ambulatory Visit (INDEPENDENT_AMBULATORY_CARE_PROVIDER_SITE_OTHER): Payer: HMO | Admitting: Family Medicine

## 2021-06-28 ENCOUNTER — Other Ambulatory Visit: Payer: Self-pay

## 2021-06-28 ENCOUNTER — Encounter: Payer: Self-pay | Admitting: Family Medicine

## 2021-06-28 VITALS — BP 124/82 | HR 88 | Temp 97.2°F | Ht 61.0 in | Wt 177.0 lb

## 2021-06-28 DIAGNOSIS — E1140 Type 2 diabetes mellitus with diabetic neuropathy, unspecified: Secondary | ICD-10-CM

## 2021-06-28 DIAGNOSIS — E1149 Type 2 diabetes mellitus with other diabetic neurological complication: Secondary | ICD-10-CM

## 2021-06-28 DIAGNOSIS — E1159 Type 2 diabetes mellitus with other circulatory complications: Secondary | ICD-10-CM

## 2021-06-28 DIAGNOSIS — Z6832 Body mass index (BMI) 32.0-32.9, adult: Secondary | ICD-10-CM | POA: Diagnosis not present

## 2021-06-28 DIAGNOSIS — K219 Gastro-esophageal reflux disease without esophagitis: Secondary | ICD-10-CM | POA: Diagnosis not present

## 2021-06-28 DIAGNOSIS — G8929 Other chronic pain: Secondary | ICD-10-CM | POA: Diagnosis not present

## 2021-06-28 DIAGNOSIS — M545 Low back pain, unspecified: Secondary | ICD-10-CM | POA: Diagnosis not present

## 2021-06-28 DIAGNOSIS — Z91148 Patient's other noncompliance with medication regimen for other reason: Secondary | ICD-10-CM | POA: Diagnosis not present

## 2021-06-28 DIAGNOSIS — I10 Essential (primary) hypertension: Secondary | ICD-10-CM | POA: Diagnosis not present

## 2021-06-28 MED ORDER — OMEPRAZOLE 20 MG PO CPDR
20.0000 mg | DELAYED_RELEASE_CAPSULE | Freq: Every day | ORAL | 3 refills | Status: DC
  Filled 2021-06-28: qty 30, 30d supply, fill #0
  Filled 2022-01-19: qty 30, 30d supply, fill #1

## 2021-06-28 MED ORDER — TIRZEPATIDE 5 MG/0.5ML ~~LOC~~ SOAJ
5.0000 mg | SUBCUTANEOUS | 11 refills | Status: DC
  Filled 2021-06-28: qty 2, 28d supply, fill #0
  Filled 2021-09-02: qty 2, 28d supply, fill #1

## 2021-06-28 NOTE — Assessment & Plan Note (Signed)
Chronic, she is doing much better with taking her medications regularly.  She seems to be doing well with monthly office visits to keep her on this path.

## 2021-06-28 NOTE — Assessment & Plan Note (Signed)
Chronic, poor control  PPI was also recommended by GI.  She has not tried this yet but would like to see if her insurance will cover a prescription.  I have sent in omeprazole 20 mg tablets to use daily.  We discussed trigger avoidance.

## 2021-06-28 NOTE — Assessment & Plan Note (Signed)
Chronic, poor control  Has been hesitant in the past to use gabapentin but does have a prescription at home of 100 mg.  I have encouraged her to try this just at bedtime and we can increase if needed

## 2021-06-28 NOTE — Assessment & Plan Note (Signed)
Minimal weight loss despite GLP-1.  Will increase the dose today.

## 2021-06-28 NOTE — Assessment & Plan Note (Signed)
Chronic, now well controlled back on amlodipine 10 mg p.o. daily and losartan 50 mg p.o. daily

## 2021-06-28 NOTE — Assessment & Plan Note (Signed)
Chronic, improved control  We will continue Mounjaro but given she has had minimal weight loss she is open to increasing the dose.  She will increase Mounjaro to 5 mg weekly.Encouraged exercise, weight loss, healthy eating habits.

## 2021-06-28 NOTE — Progress Notes (Signed)
Patient ID: Patricia Horne, female    DOB: 11/15/1950, 71 y.o.   MRN: [id number]  This visit was conducted in person.  BP 124/82 (BP Location: Left Arm, Patient Position: Sitting, Cuff Size: Normal)   Pulse 88   Temp (!) 97.2 F (36.2 C) (Temporal)   Ht 5' 1"  (1.549 m)   Wt 177 lb (80.3 kg)   SpO2 97%   BMI 33.44 kg/m    CC:  Chief Complaint  Patient presents with   Follow-up    Subjective:   HPI: Patricia Horne is a 71 y.o. female presenting on 06/28/2021 for Follow-up    Reviewed OV from Gi on 06/21/2021 for chronic constipation.  Recommended Miralax, Benefiber and Omperazole for GERD. Recommended follow up in 3 months   Diabetes:  Improved control on Mounjaro.. she has been compliant with taking her meds.  No weight loss unfortunately. Lab Results  Component Value Date   HGBA1C 7.0 (H) 06/24/2021   Using medications without difficulties: Minimal SE. Hypoglycemic episodes: Hyperglycemic episodes: Feet problems: has some neuropathy symptoms.. burning pain. Blood Sugars averaging: eye exam within last year:yes  Wt Readings from Last 3 Encounters:  06/28/21 177 lb (80.3 kg)  05/31/21 177 lb 1 oz (80.3 kg)  05/03/21 177 lb 7 oz (80.5 kg)    Hypertension:   Well controlled on current regimen.Marland Kitchen amlodipine 10 mg daily and losartan 50 mg daily. BP Readings from Last 3 Encounters:  06/28/21 124/82  05/31/21 (!) 148/86  05/03/21 (!) 195/81  Using medication without problems or lightheadedness:  Chest pain with exertion: Edema: Short of breath: Average home BPs: Other issues:     She is interested in referral to PT for her chronic back pain. Central pain, no radiation.. with standing, walking. See previous evaluation.  Relevant past medical, surgical, family and social history reviewed and updated as indicated. Interim medical history since our last visit reviewed. Allergies and medications reviewed and updated. Outpatient Medications Prior to Visit   Medication Sig Dispense Refill   acetaminophen (TYLENOL) 500 MG tablet Take 500 mg by mouth every 6 (six) hours as needed.     amLODipine (NORVASC) 10 MG tablet Take 1 tablet (10 mg total) by mouth daily. 90 tablet 3   Cholecalciferol 1.25 MG (50000 UT) capsule Take by mouth once a week. 12 capsule 0   Continuous Blood Gluc Receiver (FREESTYLE LIBRE 14 DAY READER) DEVI 1 EACH BY DOES NOT APPLY ROUTE 3 (THREE) TIMES DAILY AS NEEDED. 1 each 0   Continuous Blood Gluc Sensor (FREESTYLE LIBRE 14 DAY SENSOR) MISC 1 UNITS BY DOES NOT APPLY ROUTE EVERY 3 (THREE) DAYS AS NEEDED. 4 each 3   glucose monitoring kit (FREESTYLE) monitoring kit 1 each by Does not apply route as needed for other. 1 each 0   ketorolac (ACULAR) 0.5 % ophthalmic solution Place 1 drop into the left eye 4 (four) times daily as needed.     losartan (COZAAR) 50 MG tablet Take 1 tablet (50 mg total) by mouth daily. 30 tablet 11   tirzepatide (MOUNJARO) 2.5 MG/0.5ML Pen Inject 2.5 mg into the skin once a week. 2 mL 3   No facility-administered medications prior to visit.     Per HPI unless specifically indicated in ROS section below Review of Systems  Constitutional:  Negative for fatigue and fever.  HENT:  Negative for congestion.   Eyes:  Negative for pain.  Respiratory:  Negative for cough and shortness of breath.  Cardiovascular:  Negative for chest pain, palpitations and leg swelling.  Gastrointestinal:  Negative for abdominal pain.  Genitourinary:  Negative for dysuria and vaginal bleeding.  Musculoskeletal:  Negative for back pain.  Neurological:  Negative for syncope, light-headedness and headaches.  Psychiatric/Behavioral:  Negative for dysphoric mood.   Objective:  BP 124/82 (BP Location: Left Arm, Patient Position: Sitting, Cuff Size: Normal)   Pulse 88   Temp (!) 97.2 F (36.2 C) (Temporal)   Ht 5' 1"  (1.549 m)   Wt 177 lb (80.3 kg)   SpO2 97%   BMI 33.44 kg/m   Wt Readings from Last 3 Encounters:   06/28/21 177 lb (80.3 kg)  05/31/21 177 lb 1 oz (80.3 kg)  05/03/21 177 lb 7 oz (80.5 kg)      Physical Exam Constitutional:      General: She is not in acute distress.    Appearance: Normal appearance. She is well-developed. She is not ill-appearing or toxic-appearing.  HENT:     Head: Normocephalic.     Right Ear: Hearing, tympanic membrane, ear canal and external ear normal. Tympanic membrane is not erythematous, retracted or bulging.     Left Ear: Hearing, tympanic membrane, ear canal and external ear normal. Tympanic membrane is not erythematous, retracted or bulging.     Nose: No mucosal edema or rhinorrhea.     Right Sinus: No maxillary sinus tenderness or frontal sinus tenderness.     Left Sinus: No maxillary sinus tenderness or frontal sinus tenderness.     Mouth/Throat:     Pharynx: Uvula midline.  Eyes:     General: Lids are normal. Lids are everted, no foreign bodies appreciated.     Conjunctiva/sclera: Conjunctivae normal.     Pupils: Pupils are equal, round, and reactive to light.  Neck:     Thyroid: No thyroid mass or thyromegaly.     Vascular: No carotid bruit.     Trachea: Trachea normal.  Cardiovascular:     Rate and Rhythm: Normal rate and regular rhythm.     Pulses: Normal pulses.     Heart sounds: Normal heart sounds, S1 normal and S2 normal. No murmur heard.   No friction rub. No gallop.  Pulmonary:     Effort: Pulmonary effort is normal. No tachypnea or respiratory distress.     Breath sounds: Normal breath sounds. No decreased breath sounds, wheezing, rhonchi or rales.  Abdominal:     General: Bowel sounds are normal.     Palpations: Abdomen is soft.     Tenderness: There is no abdominal tenderness.  Musculoskeletal:     Cervical back: Normal range of motion and neck supple.  Skin:    General: Skin is warm and dry.     Findings: No rash.  Neurological:     Mental Status: She is alert.  Psychiatric:        Mood and Affect: Mood is not anxious or  depressed.        Speech: Speech normal.        Behavior: Behavior normal. Behavior is cooperative.        Thought Content: Thought content normal.        Judgment: Judgment normal.    Diabetic foot exam: Normal inspection No skin breakdown No calluses  Normal DP pulses Normal sensation to light touch and monofilament Nails normal     Results for orders placed or performed in visit on 06/24/21  Comprehensive metabolic panel  Result Value Ref Range  Sodium 140 135 - 145 mEq/L   Potassium 4.6 3.5 - 5.1 mEq/L   Chloride 107 96 - 112 mEq/L   CO2 27 19 - 32 mEq/L   Glucose, Bld 115 (H) 70 - 99 mg/dL   BUN 34 (H) 6 - 23 mg/dL   Creatinine, Ser 1.00 0.40 - 1.20 mg/dL   Total Bilirubin 0.6 0.2 - 1.2 mg/dL   Alkaline Phosphatase 70 39 - 117 U/L   AST 13 0 - 37 U/L   ALT 9 0 - 35 U/L   Total Protein 7.5 6.0 - 8.3 g/dL   Albumin 3.7 3.5 - 5.2 g/dL   GFR 56.95 (L) >60.00 mL/min   Calcium 9.5 8.4 - 10.5 mg/dL  Hemoglobin A1c  Result Value Ref Range   Hgb A1c MFr Bld 7.0 (H) 4.6 - 6.5 %  Lipid panel  Result Value Ref Range   Cholesterol 163 0 - 200 mg/dL   Triglycerides 99.0 0.0 - 149.0 mg/dL   HDL 46.60 >39.00 mg/dL   VLDL 19.8 0.0 - 40.0 mg/dL   LDL Cholesterol 96 0 - 99 mg/dL   Total CHOL/HDL Ratio 3    NonHDL 116.09      COVID 19 screen:  No recent travel or known exposure to COVID19 The patient denies respiratory symptoms of COVID 19 at this time. The importance of social distancing was discussed today.   Assessment and Plan Problem List Items Addressed This Visit     Body mass index (BMI) of 32.0-32.9 in adult    Minimal weight loss despite GLP-1.  Will increase the dose today.       Chronic midline low back pain without sciatica    Chronic, stable but not improving control  Will refer to physical therapy for core strengthening and back stretching.       Relevant Orders   Ambulatory referral to Physical Therapy   Gastroesophageal reflux disease     Chronic, poor control  PPI was also recommended by GI.  She has not tried this yet but would like to see if her insurance will cover a prescription.  I have sent in omeprazole 20 mg tablets to use daily.  We discussed trigger avoidance.       Relevant Medications   omeprazole (PRILOSEC) 20 MG capsule   Hypertension associated with diabetes ([place])    Chronic, now well controlled back on amlodipine 10 mg p.o. daily and losartan 50 mg p.o. daily       Relevant Medications   tirzepatide [facility]) 5 MG/0.5ML Pen   Neuropathy due to type 2 diabetes mellitus (HCC)    Chronic, poor control  Has been hesitant in the past to use gabapentin but does have a prescription at home of 100 mg.  I have encouraged her to try this just at bedtime and we can increase if needed       Relevant Medications   tirzepatide [facility]) 5 MG/0.5ML Pen   Noncompliance with medication regimen    Chronic, she is doing much better with taking her medications regularly.  She seems to be doing well with monthly office visits to keep her on this path.       Type 2 diabetes mellitus with neurological complications (HCC) - Primary    Chronic, improved control  We will continue Mounjaro but given she has had minimal weight loss she is open to increasing the dose.  She will increase Mounjaro to 5 mg weekly.Encouraged exercise, weight loss, healthy eating habits.  Relevant Medications   tirzepatide (MOUNJARO) 5 MG/0.5ML Pen    Meds ordered this encounter  Medications   omeprazole (PRILOSEC) 20 MG capsule    Sig: Take 1 capsule (20 mg total) by mouth daily.    Dispense:  30 capsule    Refill:  3   tirzepatide (MOUNJARO) 5 MG/0.5ML Pen    Sig: Inject 5 mg into the skin once a week.    Dispense:  2 mL    Refill:  11   Orders Placed This Encounter  Procedures   Ambulatory referral to Physical Therapy    Referral Priority:   Routine    Referral Type:   Physical Medicine    Referral Reason:    Specialty Services Required    Requested Specialty:   Physical Therapy    Number of Visits Requested:   1       Eliezer Lofts, MD

## 2021-06-28 NOTE — Assessment & Plan Note (Signed)
Chronic, stable but not improving control  Will refer to physical therapy for core strengthening and back stretching.

## 2021-06-28 NOTE — Patient Instructions (Addendum)
We will set up PT.  Increase the Mounjaro to 5 mg weekly.  Can try gabapentin 100 mg at bedtime for neuropathy.

## 2021-07-01 ENCOUNTER — Other Ambulatory Visit: Payer: Self-pay | Admitting: Family Medicine

## 2021-07-01 ENCOUNTER — Other Ambulatory Visit: Payer: Self-pay

## 2021-07-01 MED FILL — Cholecalciferol Cap 1.25 MG (50000 Unit): ORAL | 84 days supply | Qty: 12 | Fill #0 | Status: AC

## 2021-07-01 NOTE — Telephone Encounter (Signed)
Last office visit 06/28/2021 for DM.  Last refilled 04/05/2021 for #12 with no refills.  Last Vit D level 09/23/2019 which was low at 9.87 ng/mL.  Next Appt: 08/04/20 for DM & Weight Management.

## 2021-07-04 ENCOUNTER — Other Ambulatory Visit: Payer: Self-pay | Admitting: Family Medicine

## 2021-07-04 ENCOUNTER — Other Ambulatory Visit: Payer: Self-pay

## 2021-07-04 DIAGNOSIS — E1159 Type 2 diabetes mellitus with other circulatory complications: Secondary | ICD-10-CM

## 2021-07-04 MED ORDER — LOSARTAN POTASSIUM 50 MG PO TABS
50.0000 mg | ORAL_TABLET | Freq: Every day | ORAL | 11 refills | Status: DC
  Filled 2021-07-04: qty 30, 30d supply, fill #0
  Filled 2021-09-02: qty 30, 30d supply, fill #1
  Filled 2022-01-19: qty 30, 30d supply, fill #2

## 2021-07-06 DIAGNOSIS — E113312 Type 2 diabetes mellitus with moderate nonproliferative diabetic retinopathy with macular edema, left eye: Secondary | ICD-10-CM | POA: Diagnosis not present

## 2021-07-13 ENCOUNTER — Ambulatory Visit: Payer: HMO | Attending: Family Medicine

## 2021-07-13 ENCOUNTER — Telehealth: Payer: Self-pay | Admitting: Family Medicine

## 2021-07-13 ENCOUNTER — Other Ambulatory Visit: Payer: Self-pay

## 2021-07-13 DIAGNOSIS — M545 Low back pain, unspecified: Secondary | ICD-10-CM | POA: Insufficient documentation

## 2021-07-13 DIAGNOSIS — M6281 Muscle weakness (generalized): Secondary | ICD-10-CM | POA: Diagnosis not present

## 2021-07-13 DIAGNOSIS — M5459 Other low back pain: Secondary | ICD-10-CM | POA: Insufficient documentation

## 2021-07-13 DIAGNOSIS — G8929 Other chronic pain: Secondary | ICD-10-CM | POA: Insufficient documentation

## 2021-07-13 MED ORDER — FREESTYLE LIBRE 3 SENSOR MISC
11 refills | Status: DC
  Filled 2021-07-13: qty 2, 28d supply, fill #0
  Filled 2021-08-17: qty 2, 28d supply, fill #1
  Filled 2021-09-02 (×2): qty 2, 28d supply, fill #2
  Filled 2021-11-02: qty 2, 28d supply, fill #3
  Filled 2021-12-07: qty 2, 28d supply, fill #4
  Filled 2022-01-19: qty 2, 28d supply, fill #5
  Filled 2022-03-09: qty 2, 28d supply, fill #6
  Filled 2022-05-05: qty 2, 28d supply, fill #7
  Filled 2022-05-22: qty 2, 28d supply, fill #8

## 2021-07-13 NOTE — Telephone Encounter (Signed)
Rx for [employer] 3 sensors sent to [facility] as requested.
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
## 2021-07-13 NOTE — Telephone Encounter (Signed)
Pt called and said she needs her prescription switched to the freestyle libre 3, she said it was the disk that goes on her arm. She said she checked with her insurance and they said they will cover it. Callback if needed [phone]

## 2021-07-14 ENCOUNTER — Ambulatory Visit: Payer: HMO | Admitting: Gastroenterology

## 2021-07-18 ENCOUNTER — Ambulatory Visit: Payer: HMO

## 2021-07-18 ENCOUNTER — Telehealth: Payer: Self-pay

## 2021-07-19 ENCOUNTER — Ambulatory Visit: Payer: HMO

## 2021-07-20 ENCOUNTER — Ambulatory Visit: Payer: HMO

## 2021-07-20 DIAGNOSIS — M5459 Other low back pain: Secondary | ICD-10-CM

## 2021-07-20 DIAGNOSIS — M6281 Muscle weakness (generalized): Secondary | ICD-10-CM
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
## 2021-07-21 ENCOUNTER — Ambulatory Visit
Admission: RE | Admit: 2021-07-21 | Discharge: 2021-07-21 | Disposition: A | Discharge status: Home / Self Care | Payer: HMO | Source: Ambulatory Visit | Attending: Family Medicine | Admitting: Family Medicine

## 2021-07-21 DIAGNOSIS — Z780 Asymptomatic menopausal state: Secondary | ICD-10-CM | POA: Insufficient documentation

## 2021-07-21 DIAGNOSIS — Z1231 Encounter for screening mammogram for malignant neoplasm of breast: Secondary | ICD-10-CM | POA: Insufficient documentation

## 2021-07-25 ENCOUNTER — Ambulatory Visit: Payer: HMO

## 2021-07-25 DIAGNOSIS — M6281 Muscle weakness (generalized): Secondary | ICD-10-CM

## 2021-07-25 DIAGNOSIS — M5459 Other low back pain: Secondary | ICD-10-CM | POA: Diagnosis not present

## 2021-07-27 ENCOUNTER — Ambulatory Visit: Payer: HMO

## 2021-07-27 DIAGNOSIS — M6281 Muscle weakness (generalized): Secondary | ICD-10-CM

## 2021-07-27 DIAGNOSIS — M5459 Other low back pain: Secondary | ICD-10-CM
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
## 2021-08-03 ENCOUNTER — Ambulatory Visit: Payer: HMO | Attending: Family Medicine

## 2021-08-03 DIAGNOSIS — M6281 Muscle weakness (generalized): Secondary | ICD-10-CM | POA: Diagnosis not present

## 2021-08-03 DIAGNOSIS — M5459 Other low back pain: Secondary | ICD-10-CM | POA: Diagnosis not present
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
## 2021-08-04 ENCOUNTER — Ambulatory Visit: Payer: HMO | Admitting: Family Medicine

## 2021-08-08 ENCOUNTER — Ambulatory Visit: Payer: HMO

## 2021-08-08 DIAGNOSIS — M5459 Other low back pain: Secondary | ICD-10-CM

## 2021-08-08 DIAGNOSIS — M6281 Muscle weakness (generalized): Secondary | ICD-10-CM
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
## 2021-08-10 ENCOUNTER — Ambulatory Visit: Payer: HMO | Admitting: Physical Therapy

## 2021-08-10 DIAGNOSIS — M5459 Other low back pain: Secondary | ICD-10-CM

## 2021-08-10 DIAGNOSIS — M6281 Muscle weakness (generalized): Secondary | ICD-10-CM
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
## 2021-08-12 ENCOUNTER — Ambulatory Visit (INDEPENDENT_AMBULATORY_CARE_PROVIDER_SITE_OTHER): Payer: HMO | Admitting: Family Medicine

## 2021-08-12 ENCOUNTER — Encounter: Payer: Self-pay | Admitting: Family Medicine

## 2021-08-12 VITALS — BP 146/90 | HR 80 | Temp 98.1°F | Ht 61.0 in | Wt 173.1 lb

## 2021-08-12 DIAGNOSIS — I152 Hypertension secondary to endocrine disorders: Secondary | ICD-10-CM | POA: Diagnosis not present

## 2021-08-12 DIAGNOSIS — K5909 Other constipation: Secondary | ICD-10-CM | POA: Diagnosis not present

## 2021-08-12 DIAGNOSIS — Z6832 Body mass index (BMI) 32.0-32.9, adult: Secondary | ICD-10-CM

## 2021-08-12 DIAGNOSIS — R5383 Other fatigue: Secondary | ICD-10-CM

## 2021-08-12 DIAGNOSIS — E1149 Type 2 diabetes mellitus with other diabetic neurological complication: Secondary | ICD-10-CM | POA: Diagnosis not present

## 2021-08-12 DIAGNOSIS — E13319 Other specified diabetes mellitus with unspecified diabetic retinopathy without macular edema: Secondary | ICD-10-CM

## 2021-08-12 DIAGNOSIS — E1159 Type 2 diabetes mellitus with other circulatory complications: Secondary | ICD-10-CM | POA: Diagnosis not present

## 2021-08-12 LAB — COMPREHENSIVE METABOLIC PANEL
ALT: 10 U/L (ref 0–35)
AST: 15 U/L (ref 0–37)
Albumin: 3.9 g/dL (ref 3.5–5.2)
Alkaline Phosphatase: 64 U/L (ref 39–117)
BUN: 26 mg/dL — ABNORMAL HIGH (ref 6–23)
CO2: 28 mEq/L (ref 19–32)
Calcium: 9.8 mg/dL (ref 8.4–10.5)
Chloride: 105 mEq/L (ref 96–112)
Creatinine, Ser: 0.97 mg/dL (ref 0.40–1.20)
GFR: 59.02 mL/min — ABNORMAL LOW (ref >60.00)
Glucose, Bld: 93 mg/dL (ref 70–99)
Potassium: 4.4 mEq/L (ref 3.5–5.1)
Sodium: 139 mEq/L (ref 135–145)
Total Bilirubin: 0.6 mg/dL (ref 0.2–1.2)
Total Protein: 7.7 g/dL (ref 6.0–8.3)

## 2021-08-12 LAB — CBC WITH DIFFERENTIAL/PLATELET
Basophils Absolute: 0.1 10*3/uL (ref 0.0–0.1)
Basophils Relative: 1 % (ref 0.0–3.0)
Eosinophils Absolute: 0.2 10*3/uL (ref 0.0–0.7)
Eosinophils Relative: 3 % (ref 0.0–5.0)
HCT: 38.3 % (ref 36.0–46.0)
Hemoglobin: 12.9 g/dL (ref 12.0–15.0)
Lymphocytes Relative: 30.5 % (ref 12.0–46.0)
Lymphs Abs: 2.1 10*3/uL (ref 0.7–4.0)
MCHC: 33.8 g/dL (ref 30.0–36.0)
MCV: 93.8 fl (ref 78.0–100.0)
Monocytes Absolute: 0.5 10*3/uL (ref 0.1–1.0)
Monocytes Relative: 7.6 % (ref 3.0–12.0)
Neutro Abs: 4 10*3/uL (ref 1.4–7.7)
Neutrophils Relative %: 57.9 % (ref 43.0–77.0)
Platelets: 285 10*3/uL (ref 150.0–400.0)
RBC: 4.08 Mil/uL (ref 3.87–5.11)
RDW: 13.2 % (ref 11.5–15.5)
WBC: 6.8 10*3/uL (ref 4.0–10.5)

## 2021-08-12 LAB — HM DIABETES FOOT EXAM

## 2021-08-12 LAB — TSH: TSH: 1.59 u[IU]/mL (ref 0.35–5.50)

## 2021-08-12 LAB — POCT GLYCOSYLATED HEMOGLOBIN (HGB A1C): Hemoglobin A1C: 6.4 % — AB (ref 4.0–5.6)

## 2021-08-12 LAB — VITAMIN D 25 HYDROXY (VIT D DEFICIENCY, FRACTURES): VITD: 54.36 ng/mL (ref 30.00–100.00)

## 2021-08-12 LAB — VITAMIN B12: Vitamin B-12: 279 pg/mL (ref 211–911)

## 2021-08-12 NOTE — Assessment & Plan Note (Signed)
Chronic, improving  She has had some weight loss with Mounjaro increase to the 5 mg weekly.  She has lost 4 pounds in the last month.

## 2021-08-12 NOTE — Patient Instructions (Signed)
Continue physical therapy and work on increase function.  Take BP medication daily, continue current dose of Mounjaro.  Please stop at the lab to have labs drawn.

## 2021-08-12 NOTE — Assessment & Plan Note (Signed)
Chronic, improved control  She states using her friend's Linzess as needed for constipation works the best for her.  She is not interested in a prescription at this time but will call if she has.  We are unclear of dosing at this time so will not add to her medication list.

## 2021-08-12 NOTE — Assessment & Plan Note (Signed)
Chronic, she continues to see ophthalmology monthly for injections.  She states she feels she is having some dimming of her vision and I encouraged her to keep her follow-up.

## 2021-08-12 NOTE — Progress Notes (Signed)
Patient ID: Patricia Horne, female    DOB: 1950/06/16, 71 y.o.   MRN: [id number]  This visit was conducted in person.  Blood pressure (!) 146/90, pulse 80, temperature 98.1 F (36.7 C), temperature source Oral, height _0  (1.549 m), weight 173 lb 2 oz (78.5 kg), SpO2 97 %.    CC:  Chief Complaint  Patient presents with   Follow-up    Diabetes and weight management    Subjective:   HPI: Patricia Horne is a 71 y.o. female presenting on 08/12/2021 for Follow-up (Diabetes and weight management)   Diabetes:  Improving control. A1C has decreased in 1 month ( checked early by mistake)  On mounjaro 5 mg weekly. Lab Results  Component Value Date   HGBA1C 6.4 (A) 08/12/2021  Using medications without difficulties: Hypoglycemic episodes: once not sure why Hyperglycemic episodes:  Feet problems: no ulcers Blood Sugars averaging: Continuous glucose monitor FBS eye exam within last year:yes  Body mass index is 32.71 kg/m.  Wt Readings from Last 3 Encounters:  08/12/21 173 lb 2 oz (78.5 kg)  06/28/21 177 lb (80.3 kg)  05/31/21 177 lb 1 oz (80.3 kg)    She continues to be inconsistent with taking her medication and has not taken he blood pressure medication this AM.  Not checking at home.   Linzess helping with chronic constipation... she got it from her friend.  She has been going to PT for low back pain.     Relevant past medical, surgical, family and social history reviewed and updated as indicated. Interim medical history since our last visit reviewed. Allergies and medications reviewed and updated. Outpatient Medications Prior to Visit  Medication Sig Dispense Refill   acetaminophen (TYLENOL) 500 MG tablet Take 500 mg by mouth every 6 (six) hours as needed.     amLODipine (NORVASC) 10 MG tablet Take 1 tablet (10 mg total) by mouth daily. 90 tablet 3   Cholecalciferol (D3-50) 1.25 MG (50000 UT) capsule Take by mouth once a week. 12 capsule 0   Continuous Blood  Gluc Receiver (FREESTYLE LIBRE 14 DAY READER) DEVI 1 EACH BY DOES NOT APPLY ROUTE 3 (THREE) TIMES DAILY AS NEEDED. 1 each 0   Continuous Blood Gluc Sensor (FREESTYLE LIBRE 3 SENSOR) MISC Place 1 sensor on the skin every 14 days. Use to check glucose continuously 2 each 11   glucose monitoring kit (FREESTYLE) monitoring kit 1 each by Does not apply route as needed for other. 1 each 0   ketorolac (ACULAR) 0.5 % ophthalmic solution Place 1 drop into the left eye 4 (four) times daily as needed.     losartan (COZAAR) 50 MG tablet Take 1 tablet (50 mg total) by mouth daily. 30 tablet 11   omeprazole (PRILOSEC) 20 MG capsule Take 1 capsule (20 mg total) by mouth daily. 30 capsule 3   tirzepatide (MOUNJARO) 5 MG/0.5ML Pen Inject 5 mg into the skin once a week. 2 mL 11   tirzepatide (MOUNJARO) 2.5 MG/0.5ML Pen Inject 2.5 mg into the skin once a week. 2 mL 3   No facility-administered medications prior to visit.     Per HPI unless specifically indicated in ROS section below Review of Systems  Constitutional:  Negative for fatigue and fever.  HENT:  Negative for congestion.   Eyes:  Negative for pain.  Respiratory:  Negative for cough and shortness of breath.   Cardiovascular:  Negative for chest pain, palpitations and leg swelling.  Gastrointestinal:  Negative  for abdominal pain.  Genitourinary:  Negative for dysuria and vaginal bleeding.  Musculoskeletal:  Negative for back pain.  Neurological:  Negative for syncope, light-headedness and headaches.  Psychiatric/Behavioral:  Negative for dysphoric mood.    Objective:  There were no vitals taken for this visit.  Wt Readings from Last 3 Encounters:  06/28/21 177 lb (80.3 kg)  05/31/21 177 lb 1 oz (80.3 kg)  05/03/21 177 lb 7 oz (80.5 kg)      Physical Exam Constitutional:      General: She is not in acute distress.    Appearance: Normal appearance. She is well-developed. She is not ill-appearing or toxic-appearing.  HENT:     Head:  Normocephalic.     Right Ear: Hearing, tympanic membrane, ear canal and external ear normal. Tympanic membrane is not erythematous, retracted or bulging.     Left Ear: Hearing, tympanic membrane, ear canal and external ear normal. Tympanic membrane is not erythematous, retracted or bulging.     Nose: No mucosal edema or rhinorrhea.     Right Sinus: No maxillary sinus tenderness or frontal sinus tenderness.     Left Sinus: No maxillary sinus tenderness or frontal sinus tenderness.     Mouth/Throat:     Pharynx: Uvula midline.  Eyes:     General: Lids are normal. Lids are everted, no foreign bodies appreciated.     Conjunctiva/sclera: Conjunctivae normal.     Pupils: Pupils are equal, round, and reactive to light.  Neck:     Thyroid: No thyroid mass or thyromegaly.     Vascular: No carotid bruit.     Trachea: Trachea normal.  Cardiovascular:     Rate and Rhythm: Normal rate and regular rhythm.     Pulses: Normal pulses.     Heart sounds: Normal heart sounds, S1 normal and S2 normal. No murmur heard.    No friction rub. No gallop.  Pulmonary:     Effort: Pulmonary effort is normal. No tachypnea or respiratory distress.     Breath sounds: Normal breath sounds. No decreased breath sounds, wheezing, rhonchi or rales.  Abdominal:     General: Bowel sounds are normal.     Palpations: Abdomen is soft.     Tenderness: There is no abdominal tenderness.  Musculoskeletal:     Cervical back: Normal range of motion and neck supple.  Skin:    General: Skin is warm and dry.     Findings: No rash.  Neurological:     Mental Status: She is alert.  Psychiatric:        Mood and Affect: Mood is not anxious or depressed.        Speech: Speech normal.        Behavior: Behavior normal. Behavior is cooperative.        Thought Content: Thought content normal.        Judgment: Judgment normal.    Diabetic foot exam: Normal inspection No skin breakdown No calluses  Normal DP pulses Normal sensation  to light touch and monofilament Nails normal     Results for orders placed or performed in visit on 08/12/21  POCT glycosylated hemoglobin (Hb A1C)  Result Value Ref Range   Hemoglobin A1C 6.4 (A) 4.0 - 5.6 %   HbA1c POC (<> result, manual entry)     HbA1c, POC (prediabetic range)     HbA1c, POC (controlled diabetic range)       COVID 19 screen:  No recent travel or known exposure to  COVID19 The patient denies respiratory symptoms of COVID 19 at this time. The importance of social distancing was discussed today.   Assessment and Plan    Problem List Items Addressed This Visit     Body mass index (BMI) of 32.0-32.9 in adult    Chronic, improving  She has had some weight loss with Mounjaro increase to the 5 mg weekly.  She has lost 4 pounds in the last month.      Chronic constipation    Chronic, improved control  She states using her friend's Linzess as needed for constipation works the best for her.  She is not interested in a prescription at this time but will call if she has.  We are unclear of dosing at this time so will not add to her medication list.      Diabetic retinopathy ([place])    Chronic, she continues to see ophthalmology monthly for injections.  She states she feels she is having some dimming of her vision and I encouraged her to keep her follow-up.      Fatigue    Acute worsening of chronic issue  I encouraged patient to continue taking her medications as I do not think these are causing this issue given she has had it on and off the medications.  We will look into further lab evaluation for secondary causes.      Relevant Orders   CBC with Differential/Platelet   TSH   VITAMIN D 25 Hydroxy (Vit-D Deficiency, Fractures)   Comprehensive metabolic panel   Vitamin N81   Hypertension associated with diabetes (HCC)    Chronic, previously well controlled without side effects on amlodipine 10 mg p.o. daily and losartan 50 mg p.o. daily.  She has been  noncompliant with this medication today but plans to restart.  She will follow blood pressure at home.      Type 2 diabetes mellitus with neurological complications (HCC) - Primary    Chronic, improved control  Continue Mounjaro 5 mg weekly, no side effects.      Relevant Orders   POCT glycosylated hemoglobin (Hb A1C) (Completed)     Eliezer Lofts, MD

## 2021-08-12 NOTE — Assessment & Plan Note (Signed)
Acute worsening of chronic issue  I encouraged patient to continue taking her medications as I do not think these are causing this issue given she has had it on and off the medications.  We will look into further lab evaluation for secondary causes.

## 2021-08-12 NOTE — Assessment & Plan Note (Signed)
Chronic, previously well controlled without side effects on amlodipine 10 mg p.o. daily and losartan 50 mg p.o. daily.  She has been noncompliant with this medication today but plans to restart.  She will follow blood pressure at home.

## 2021-08-12 NOTE — Assessment & Plan Note (Signed)
Chronic, improved control  Continue Mounjaro 5 mg weekly, no side effects.

## 2021-08-15 ENCOUNTER — Ambulatory Visit: Payer: HMO

## 2021-08-15 DIAGNOSIS — M5459 Other low back pain: Secondary | ICD-10-CM | POA: Diagnosis not present

## 2021-08-15 DIAGNOSIS — M6281 Muscle weakness (generalized): Secondary | ICD-10-CM
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
## 2021-08-17 ENCOUNTER — Ambulatory Visit: Payer: HMO

## 2021-08-17 DIAGNOSIS — M6281 Muscle weakness (generalized): Secondary | ICD-10-CM

## 2021-08-17 DIAGNOSIS — M5459 Other low back pain: Secondary | ICD-10-CM | POA: Diagnosis not present

## 2021-08-17 DIAGNOSIS — E113312 Type 2 diabetes mellitus with moderate nonproliferative diabetic retinopathy with macular edema, left eye: Secondary | ICD-10-CM | POA: Diagnosis not present
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
## 2021-08-18 ENCOUNTER — Other Ambulatory Visit: Payer: Self-pay

## 2021-08-19 ENCOUNTER — Other Ambulatory Visit: Payer: Self-pay

## 2021-08-22 ENCOUNTER — Ambulatory Visit: Payer: HMO

## 2021-08-22 DIAGNOSIS — M6281 Muscle weakness (generalized): Secondary | ICD-10-CM

## 2021-08-22 DIAGNOSIS — M5459 Other low back pain: Secondary | ICD-10-CM
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
## 2021-08-24 ENCOUNTER — Ambulatory Visit: Payer: HMO

## 2021-08-24 DIAGNOSIS — M6281 Muscle weakness (generalized): Secondary | ICD-10-CM

## 2021-08-24 DIAGNOSIS — M5459 Other low back pain: Secondary | ICD-10-CM | POA: Diagnosis not present
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
## 2021-08-31 ENCOUNTER — Ambulatory Visit: Payer: HMO

## 2021-09-02 ENCOUNTER — Other Ambulatory Visit: Payer: Self-pay

## 2021-09-02 ENCOUNTER — Encounter: Payer: Self-pay | Admitting: Family Medicine

## 2021-09-06 ENCOUNTER — Ambulatory Visit: Payer: HMO

## 2021-09-07 ENCOUNTER — Ambulatory Visit: Payer: HMO | Attending: Family Medicine

## 2021-09-07 ENCOUNTER — Other Ambulatory Visit: Payer: Self-pay

## 2021-09-07 DIAGNOSIS — M5459 Other low back pain: Secondary | ICD-10-CM | POA: Diagnosis not present

## 2021-09-07 DIAGNOSIS — M6281 Muscle weakness (generalized): Secondary | ICD-10-CM | POA: Diagnosis not present
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
## 2021-09-09 ENCOUNTER — Other Ambulatory Visit: Payer: Self-pay

## 2021-09-14 ENCOUNTER — Ambulatory Visit: Payer: HMO

## 2021-09-14 DIAGNOSIS — M5459 Other low back pain: Secondary | ICD-10-CM

## 2021-09-14 DIAGNOSIS — M6281 Muscle weakness (generalized): Secondary | ICD-10-CM
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
## 2021-09-16 ENCOUNTER — Ambulatory Visit (INDEPENDENT_AMBULATORY_CARE_PROVIDER_SITE_OTHER): Payer: HMO | Admitting: Family Medicine

## 2021-09-16 ENCOUNTER — Encounter: Payer: Self-pay | Admitting: Family Medicine

## 2021-09-16 VITALS — BP 138/76 | HR 88 | Temp 98.2°F | Ht 61.0 in | Wt 169.5 lb

## 2021-09-16 DIAGNOSIS — Z6832 Body mass index (BMI) 32.0-32.9, adult: Secondary | ICD-10-CM | POA: Diagnosis not present

## 2021-09-16 DIAGNOSIS — E1149 Type 2 diabetes mellitus with other diabetic neurological complication: Secondary | ICD-10-CM | POA: Diagnosis not present

## 2021-09-16 DIAGNOSIS — I152 Hypertension secondary to endocrine disorders: Secondary | ICD-10-CM | POA: Diagnosis not present

## 2021-09-16 DIAGNOSIS — E1159 Type 2 diabetes mellitus with other circulatory complications: Secondary | ICD-10-CM | POA: Diagnosis not present

## 2021-09-16 DIAGNOSIS — R5383 Other fatigue: Secondary | ICD-10-CM | POA: Diagnosis not present

## 2021-09-16 DIAGNOSIS — H538 Other visual disturbances: Secondary | ICD-10-CM | POA: Diagnosis not present

## 2021-09-16 DIAGNOSIS — E13319 Other specified diabetes mellitus with unspecified diabetic retinopathy without macular edema: Secondary | ICD-10-CM | POA: Diagnosis not present

## 2021-09-16 NOTE — Assessment & Plan Note (Signed)
She has chronic intermittent blurred vision, lab work-up has been unremarkable.  She has upcoming appointment with diabetic specialist for reevaluation and possible further injections.  I have asked her to look into whether her intermittent blurred vision and dizziness could be explained by her vision issues.  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  If ophthalmologist does not believe that chronic intermittent blurred vision is from eye issues we will consider referring to neurology.

## 2021-09-16 NOTE — Assessment & Plan Note (Signed)
Lab work-up in July was unremarkable with normal vitamin levels and thyroid, no electrolyte abnormalities or new liver and kidney function issues.

## 2021-09-16 NOTE — Assessment & Plan Note (Signed)
Chronic, poor control intermittently.  No evidence of blood pressures dropping low as cause of lightheadedness or chronic blurry vision.  She will restart losartan 50 mg daily but will continue to hold amlodipine 10 mg daily.

## 2021-09-16 NOTE — Patient Instructions (Addendum)
Restart losartan 50 mg daily.. follow BP.Marland Kitchen goal < 140/90.  Continue Mounjaro 5 mg weekly.  Continue working on healthy eating and regular exercise.  If eye MD does not think the blurred vision and dizzy spells are due to vision isse.. call for a referral to neurologist.

## 2021-09-16 NOTE — Assessment & Plan Note (Signed)
Chronic, well controlled  She has been compliant with Mounjaro 5 mg weekly.  She has continued to lose weight about 8 pounds in the last 3 months.  She continues to work on low carbohydrate diet and is trying to increase exercise.  We will continue this dose of medication

## 2021-09-16 NOTE — Assessment & Plan Note (Signed)
No association with low blood pressures or low blood sugars.  Lab evaluation negative.  Normal neurologic exam.  No red flags or indication for imaging.  She will follow-up with ophthalmologist.  If no clear eye source we will refer to neurology

## 2021-09-16 NOTE — Assessment & Plan Note (Signed)
Chronic, improving with Mounjaro and lifestyle changes

## 2021-09-16 NOTE — Progress Notes (Signed)
Patient ID: Patricia Horne, female    DOB: 11/11/1950, 71 y.o.   MRN: [id number]  This visit was conducted in person.  BP (!) 142/80 (BP Location: Left Arm, Patient Position: Sitting)   Pulse 88   Temp 98.2 F (36.8 C) (Oral)   Ht 5' 1"  (1.549 m)   Wt 169 lb 8 oz (76.9 kg)   SpO2 98%   BMI 32.03 kg/m    CC:  Chief Complaint  Patient presents with   Follow-up    On medication    Subjective:   HPI: Patricia Horne is a 71 y.o. female presenting on 09/16/2021 for Follow-up (On medication)  Diabetes: On mounjaro 5 mg weekly. Lab Results  Component Value Date   HGBA1C 6.4 (A) 08/12/2021  Using medications without difficulties: Hypoglycemic episodes: none Hyperglycemic episodes: none Feet problems: no ulcers Blood Sugars averaging: FBS 96-117, rare > 146  eye exam within last year: Wt Readings from Last 3 Encounters:  09/16/21 169 lb 8 oz (76.9 kg)  08/12/21 173 lb 2 oz (78.5 kg)  06/28/21 177 lb (80.3 kg)   Body mass index is 32.03 kg/m.  She has lost 8 lbs in last 3 months.  Hypertension:   Associated with DM  Borderline control in office ( 138/76) .. she has NOT taken her losartan 50 mg daily and amlodipine 10 mg daily today  Using medication without problems or lightheadedness:  she states that her dizziness and chronic blurry vision are worse when taking  the BP med.. not resolved though off it. Chest pain with exertion: none Edema: none Short of breath: none Average home BPs: 148/78 Other issues:       Relevant past medical, surgical, family and social history reviewed and updated as indicated. Interim medical history since our last visit reviewed. Allergies and medications reviewed and updated. Outpatient Medications Prior to Visit  Medication Sig Dispense Refill   acetaminophen (TYLENOL) 500 MG tablet Take 500 mg by mouth every 6 (six) hours as needed.     Cholecalciferol (D3-50) 1.25 MG (50000 UT) capsule Take by mouth once a week. 12 capsule 0    Continuous Blood Gluc Receiver (FREESTYLE LIBRE 14 DAY READER) DEVI 1 EACH BY DOES NOT APPLY ROUTE 3 (THREE) TIMES DAILY AS NEEDED. 1 each 0   Continuous Blood Gluc Sensor (FREESTYLE LIBRE 3 SENSOR) MISC Place 1 sensor on the skin every 14 days. Use to check glucose continuously 2 each 11   glucose monitoring kit (FREESTYLE) monitoring kit 1 each by Does not apply route as needed for other. 1 each 0   losartan (COZAAR) 50 MG tablet Take 1 tablet (50 mg total) by mouth daily. 30 tablet 11   omeprazole (PRILOSEC) 20 MG capsule Take 1 capsule (20 mg total) by mouth daily. 30 capsule 3   tirzepatide (MOUNJARO) 5 MG/0.5ML Pen Inject 5 mg into the skin once a week. 2 mL 11   amLODipine (NORVASC) 10 MG tablet Take 1 tablet (10 mg total) by mouth daily. (Patient not taking: Reported on 09/16/2021) 90 tablet 3   ketorolac (ACULAR) 0.5 % ophthalmic solution Place 1 drop into the left eye 4 (four) times daily as needed. (Patient not taking: Reported on 09/16/2021)     No facility-administered medications prior to visit.     Per HPI unless specifically indicated in ROS section below Review of Systems Objective:  BP (!) 142/80 (BP Location: Left Arm, Patient Position: Sitting)   Pulse 88   Temp  98.2 F (36.8 C) (Oral)   Ht 5' 1"  (1.549 m)   Wt 169 lb 8 oz (76.9 kg)   SpO2 98%   BMI 32.03 kg/m   Wt Readings from Last 3 Encounters:  09/16/21 169 lb 8 oz (76.9 kg)  08/12/21 173 lb 2 oz (78.5 kg)  06/28/21 177 lb (80.3 kg)      Physical Exam    Results for orders placed or performed in visit on 08/12/21  CBC with Differential/Platelet  Result Value Ref Range   WBC 6.8 4.0 - 10.5 K/uL   RBC 4.08 3.87 - 5.11 Mil/uL   Hemoglobin 12.9 12.0 - 15.0 g/dL   HCT 38.3 36.0 - 46.0 %   MCV 93.8 78.0 - 100.0 fl   MCHC 33.8 30.0 - 36.0 g/dL   RDW 13.2 11.5 - 15.5 %   Platelets 285.0 150.0 - 400.0 K/uL   Neutrophils Relative % 57.9 43.0 - 77.0 %   Lymphocytes Relative 30.5 12.0 - 46.0 %   Monocytes  Relative 7.6 3.0 - 12.0 %   Eosinophils Relative 3.0 0.0 - 5.0 %   Basophils Relative 1.0 0.0 - 3.0 %   Neutro Abs 4.0 1.4 - 7.7 K/uL   Lymphs Abs 2.1 0.7 - 4.0 K/uL   Monocytes Absolute 0.5 0.1 - 1.0 K/uL   Eosinophils Absolute 0.2 0.0 - 0.7 K/uL   Basophils Absolute 0.1 0.0 - 0.1 K/uL  TSH  Result Value Ref Range   TSH 1.59 0.35 - 5.50 uIU/mL  VITAMIN D 25 Hydroxy (Vit-D Deficiency, Fractures)  Result Value Ref Range   VITD 54.36 30.00 - 100.00 ng/mL  Comprehensive metabolic panel  Result Value Ref Range   Sodium 139 135 - 145 mEq/L   Potassium 4.4 3.5 - 5.1 mEq/L   Chloride 105 96 - 112 mEq/L   CO2 28 19 - 32 mEq/L   Glucose, Bld 93 70 - 99 mg/dL   BUN 26 (H) 6 - 23 mg/dL   Creatinine, Ser 0.97 0.40 - 1.20 mg/dL   Total Bilirubin 0.6 0.2 - 1.2 mg/dL   Alkaline Phosphatase 64 39 - 117 U/L   AST 15 0 - 37 U/L   ALT 10 0 - 35 U/L   Total Protein 7.7 6.0 - 8.3 g/dL   Albumin 3.9 3.5 - 5.2 g/dL   GFR 59.02 (L) >60.00 mL/min   Calcium 9.8 8.4 - 10.5 mg/dL  Vitamin B12  Result Value Ref Range   Vitamin B-12 279 211 - 911 pg/mL  POCT glycosylated hemoglobin (Hb A1C)  Result Value Ref Range   Hemoglobin A1C 6.4 (A) 4.0 - 5.6 %   HbA1c POC (<> result, manual entry)     HbA1c, POC (prediabetic range)     HbA1c, POC (controlled diabetic range)    HM DIABETES FOOT EXAM  Result Value Ref Range   HM Diabetic Foot Exam done      COVID 19 screen:  No recent travel or known exposure to COVID19 The patient denies respiratory symptoms of COVID 19 at this time. The importance of social distancing was discussed today.   Assessment and Plan    Problem List Items Addressed This Visit     Blurred vision, bilateral    No association with low blood pressures or low blood sugars.  Lab evaluation negative.  Normal neurologic exam.  No red flags or indication for imaging.  She will follow-up with ophthalmologist.  If no clear eye source we will refer to neurology  Body mass index  (BMI) of 32.0-32.9 in adult    Chronic, improving with Mounjaro and lifestyle changes      Diabetic retinopathy ([place])    She has chronic intermittent blurred vision, lab work-up has been unremarkable.  She has upcoming appointment with diabetic specialist for reevaluation and possible further injections.  I have asked her to look into whether her intermittent blurred vision and dizziness could be explained by her vision issues.  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  If ophthalmologist does not believe that chronic intermittent blurred vision is from eye issues we will consider referring to neurology.      Fatigue    Lab work-up in July was unremarkable with normal vitamin levels and thyroid, no electrolyte abnormalities or new liver and kidney function issues.      Hypertension associated with diabetes ([place])    Chronic, poor control intermittently.  No evidence of blood pressures dropping low as cause of lightheadedness or chronic blurry vision.  She will restart losartan 50 mg daily but will continue to hold amlodipine 10 mg daily.      Type 2 diabetes mellitus with neurological complications (HCC) - Primary    Chronic, well controlled  She has been compliant with Mounjaro 5 mg weekly.  She has continued to lose weight about 8 pounds in the last 3 months.  She continues to work on low carbohydrate diet and is trying to increase exercise.  We will continue this dose of medication        Eliezer Lofts, MD

## 2021-09-21 ENCOUNTER — Ambulatory Visit: Payer: HMO

## 2021-09-22 ENCOUNTER — Ambulatory Visit: Payer: HMO

## 2021-09-22 DIAGNOSIS — M6281 Muscle weakness (generalized): Secondary | ICD-10-CM

## 2021-09-22 DIAGNOSIS — M5459 Other low back pain: Secondary | ICD-10-CM | POA: Diagnosis not present
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
## 2021-09-28 ENCOUNTER — Ambulatory Visit: Payer: HMO

## 2021-09-28 DIAGNOSIS — M5459 Other low back pain: Secondary | ICD-10-CM

## 2021-09-28 DIAGNOSIS — M6281 Muscle weakness (generalized): Secondary | ICD-10-CM
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
## 2021-09-29 ENCOUNTER — Ambulatory Visit: Payer: HMO

## 2021-09-29 DIAGNOSIS — R051 Acute cough: Secondary | ICD-10-CM | POA: Diagnosis not present

## 2021-10-05 ENCOUNTER — Ambulatory Visit: Payer: HMO | Attending: Family Medicine

## 2021-10-05 DIAGNOSIS — M5459 Other low back pain: Secondary | ICD-10-CM

## 2021-10-05 DIAGNOSIS — M6281 Muscle weakness (generalized): Secondary | ICD-10-CM | POA: Diagnosis not present
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
## 2021-10-06 ENCOUNTER — Ambulatory Visit: Payer: HMO

## 2021-10-10 ENCOUNTER — Ambulatory Visit: Payer: HMO

## 2021-10-10 DIAGNOSIS — M5459 Other low back pain: Secondary | ICD-10-CM

## 2021-10-10 DIAGNOSIS — M6281 Muscle weakness (generalized): Secondary | ICD-10-CM
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
## 2021-10-12 DIAGNOSIS — E113312 Type 2 diabetes mellitus with moderate nonproliferative diabetic retinopathy with macular edema, left eye: Secondary | ICD-10-CM | POA: Diagnosis not present

## 2021-10-13 ENCOUNTER — Ambulatory Visit: Payer: HMO

## 2021-10-14 ENCOUNTER — Encounter: Payer: Self-pay | Admitting: Family Medicine

## 2021-10-14 ENCOUNTER — Ambulatory Visit (INDEPENDENT_AMBULATORY_CARE_PROVIDER_SITE_OTHER): Payer: HMO | Admitting: Family Medicine

## 2021-10-14 ENCOUNTER — Other Ambulatory Visit: Payer: Self-pay

## 2021-10-14 VITALS — BP 170/90 | HR 86 | Temp 98.6°F | Ht 61.0 in | Wt 169.5 lb

## 2021-10-14 DIAGNOSIS — Z6832 Body mass index (BMI) 32.0-32.9, adult: Secondary | ICD-10-CM | POA: Diagnosis not present

## 2021-10-14 DIAGNOSIS — I152 Hypertension secondary to endocrine disorders: Secondary | ICD-10-CM | POA: Diagnosis not present

## 2021-10-14 DIAGNOSIS — Z23 Encounter for immunization: Secondary | ICD-10-CM | POA: Diagnosis not present

## 2021-10-14 DIAGNOSIS — E1149 Type 2 diabetes mellitus with other diabetic neurological complication: Secondary | ICD-10-CM

## 2021-10-14 DIAGNOSIS — K5909 Other constipation: Secondary | ICD-10-CM

## 2021-10-14 DIAGNOSIS — E1159 Type 2 diabetes mellitus with other circulatory complications: Secondary | ICD-10-CM | POA: Diagnosis not present

## 2021-10-14 DIAGNOSIS — E1140 Type 2 diabetes mellitus with diabetic neuropathy, unspecified: Secondary | ICD-10-CM | POA: Diagnosis not present

## 2021-10-14 MED ORDER — TIRZEPATIDE 7.5 MG/0.5ML ~~LOC~~ SOAJ
7.5000 mg | SUBCUTANEOUS | 11 refills | Status: DC
  Filled 2021-10-14 – 2021-10-17 (×2): qty 2, 28d supply, fill #0
  Filled 2021-11-02 – 2021-11-03 (×3): qty 2, 28d supply, fill #1

## 2021-10-14 MED ORDER — LINACLOTIDE 145 MCG PO CAPS
145.0000 ug | ORAL_CAPSULE | Freq: Every day | ORAL | 11 refills | Status: DC
  Filled 2021-10-14: qty 30, 30d supply, fill #0

## 2021-10-14 NOTE — Assessment & Plan Note (Signed)
Encouraged exercise, weight loss, healthy eating habits. Increase Mounjaro to 7.5 mg weekly.

## 2021-10-14 NOTE — Assessment & Plan Note (Signed)
Chronic,  well controlled when medication in her system.  Continue amlodipine 10 mg daily and losartan 50 mg daily.

## 2021-10-14 NOTE — Progress Notes (Signed)
Patient ID: Patricia Horne, female    DOB: 10-24-1950, 71 y.o.   MRN: [id number]  This visit was conducted in person.  BP (!) 170/90   Pulse 86   Temp 98.6 F (37 C) (Oral)   Ht _0  (1.549 m)   Wt 169 lb 8 oz (76.9 kg)   SpO2 98%   BMI 32.03 kg/m    CC:  Chief Complaint  Patient presents with   Diabetes   Weight Check    Subjective:   HPI: Patricia Horne is a 71 y.o. female presenting on 10/14/2021 for Diabetes and Weight Check  Diabetes:   Mounjaro  5 mg weekly causes constipation..linzess helps.  Lab Results  Component Value Date   HGBA1C 6.4 (A) 08/12/2021  Using medications without difficulties: Hypoglycemic episodes: Hyperglycemic episodes: Feet problems: no ulcer Blood Sugars averaging: FBS 129... Per CGM when has been in range 99% of days.  Body mass index is 32.03 kg/m.  Wt Readings from Last 3 Encounters:  10/14/21 169 lb 8 oz (76.9 kg)  09/16/21 169 lb 8 oz (76.9 kg)  08/12/21 173 lb 2 oz (78.5 kg)   Hypertension:   Poor control in office today.. she took her med a few minutes ago... has not started working yet. losartan 50 mg daily and amlodipine 10 mg daily  Using medication without problems or lightheadedness: none Chest pain with exertion:none Edema:none Short of breath:none Average home BPs: Other issues:       Relevant past medical, surgical, family and social history reviewed and updated as indicated. Interim medical history since our last visit reviewed. Allergies and medications reviewed and updated. Outpatient Medications Prior to Visit  Medication Sig Dispense Refill   acetaminophen (TYLENOL) 500 MG tablet Take 500 mg by mouth every 6 (six) hours as needed.     Cholecalciferol (D3-50) 1.25 MG (50000 UT) capsule Take by mouth once a week. 12 capsule 0   Continuous Blood Gluc Receiver (FREESTYLE LIBRE 14 DAY READER) DEVI 1 EACH BY DOES NOT APPLY ROUTE 3 (THREE) TIMES DAILY AS NEEDED. 1 each 0   Continuous Blood Gluc Sensor  (FREESTYLE LIBRE 3 SENSOR) MISC Place 1 sensor on the skin every 14 days. Use to check glucose continuously 2 each 11   glucose monitoring kit (FREESTYLE) monitoring kit 1 each by Does not apply route as needed for other. 1 each 0   losartan (COZAAR) 50 MG tablet Take 1 tablet (50 mg total) by mouth daily. 30 tablet 11   omeprazole (PRILOSEC) 20 MG capsule Take 1 capsule (20 mg total) by mouth daily. 30 capsule 3   tirzepatide (MOUNJARO) 5 MG/0.5ML Pen Inject 5 mg into the skin once a week. 2 mL 11   amLODipine (NORVASC) 10 MG tablet Take 1 tablet (10 mg total) by mouth daily. (Patient not taking: Reported on 09/16/2021) 90 tablet 3   ketorolac (ACULAR) 0.5 % ophthalmic solution Place 1 drop into the left eye 4 (four) times daily as needed. (Patient not taking: Reported on 09/16/2021)     No facility-administered medications prior to visit.     Per HPI unless specifically indicated in ROS section below Review of Systems  Constitutional:  Negative for fatigue and fever.  HENT:  Negative for congestion.   Eyes:  Negative for pain.  Respiratory:  Negative for cough and shortness of breath.   Cardiovascular:  Negative for chest pain, palpitations and leg swelling.  Gastrointestinal:  Negative for abdominal pain.  Genitourinary:  Negative for dysuria and vaginal bleeding.  Musculoskeletal:  Negative for back pain.  Neurological:  Negative for syncope, light-headedness and headaches.  Psychiatric/Behavioral:  Negative for dysphoric mood.    Objective:  BP (!) 170/90   Pulse 86   Temp 98.6 F (37 C) (Oral)   Ht 5\' 1"  (1.549 m)   Wt 169 lb 8 oz (76.9 kg)   SpO2 98%   BMI 32.03 kg/m   Wt Readings from Last 3 Encounters:  10/14/21 169 lb 8 oz (76.9 kg)  09/16/21 169 lb 8 oz (76.9 kg)  08/12/21 173 lb 2 oz (78.5 kg)      Physical Exam Constitutional:      General: She is not in acute distress.    Appearance: Normal appearance. She is well-developed. She is not ill-appearing or  toxic-appearing.  HENT:     Head: Normocephalic.     Right Ear: Hearing, tympanic membrane, ear canal and external ear normal. Tympanic membrane is not erythematous, retracted or bulging.     Left Ear: Hearing, tympanic membrane, ear canal and external ear normal. Tympanic membrane is not erythematous, retracted or bulging.     Nose: No mucosal edema or rhinorrhea.     Right Sinus: No maxillary sinus tenderness or frontal sinus tenderness.     Left Sinus: No maxillary sinus tenderness or frontal sinus tenderness.     Mouth/Throat:     Pharynx: Uvula midline.  Eyes:     General: Lids are normal. Lids are everted, no foreign bodies appreciated.     Conjunctiva/sclera: Conjunctivae normal.     Pupils: Pupils are equal, round, and reactive to light.  Neck:     Thyroid: No thyroid mass or thyromegaly.     Vascular: No carotid bruit.     Trachea: Trachea normal.  Cardiovascular:     Rate and Rhythm: Normal rate and regular rhythm.     Pulses: Normal pulses.     Heart sounds: Normal heart sounds, S1 normal and S2 normal. No murmur heard.    No friction rub. No gallop.  Pulmonary:     Effort: Pulmonary effort is normal. No tachypnea or respiratory distress.     Breath sounds: Normal breath sounds. No decreased breath sounds, wheezing, rhonchi or rales.  Abdominal:     General: Bowel sounds are normal.     Palpations: Abdomen is soft.     Tenderness: There is no abdominal tenderness.  Musculoskeletal:     Cervical back: Normal range of motion and neck supple.  Skin:    General: Skin is warm and dry.     Findings: No rash.  Neurological:     Mental Status: She is alert.  Psychiatric:        Mood and Affect: Mood is not anxious or depressed.        Speech: Speech normal.        Behavior: Behavior normal. Behavior is cooperative.        Thought Content: Thought content normal.        Judgment: Judgment normal.       Results for orders placed or performed in visit on 08/12/21  CBC  with Differential/Platelet  Result Value Ref Range   WBC 6.8 4.0 - 10.5 K/uL   RBC 4.08 3.87 - 5.11 Mil/uL   Hemoglobin 12.9 12.0 - 15.0 g/dL   HCT 08/14/21 10.5 - 58.2 %   MCV 93.8 78.0 - 100.0 fl   MCHC 33.8 30.0 - 36.0 g/dL  RDW 13.2 11.5 - 15.5 %   Platelets 285.0 150.0 - 400.0 K/uL   Neutrophils Relative % 57.9 43.0 - 77.0 %   Lymphocytes Relative 30.5 12.0 - 46.0 %   Monocytes Relative 7.6 3.0 - 12.0 %   Eosinophils Relative 3.0 0.0 - 5.0 %   Basophils Relative 1.0 0.0 - 3.0 %   Neutro Abs 4.0 1.4 - 7.7 K/uL   Lymphs Abs 2.1 0.7 - 4.0 K/uL   Monocytes Absolute 0.5 0.1 - 1.0 K/uL   Eosinophils Absolute 0.2 0.0 - 0.7 K/uL   Basophils Absolute 0.1 0.0 - 0.1 K/uL  TSH  Result Value Ref Range   TSH 1.59 0.35 - 5.50 uIU/mL  VITAMIN D 25 Hydroxy (Vit-D Deficiency, Fractures)  Result Value Ref Range   VITD 54.36 30.00 - 100.00 ng/mL  Comprehensive metabolic panel  Result Value Ref Range   Sodium 139 135 - 145 mEq/L   Potassium 4.4 3.5 - 5.1 mEq/L   Chloride 105 96 - 112 mEq/L   CO2 28 19 - 32 mEq/L   Glucose, Bld 93 70 - 99 mg/dL   BUN 26 (H) 6 - 23 mg/dL   Creatinine, Ser 0.97 0.40 - 1.20 mg/dL   Total Bilirubin 0.6 0.2 - 1.2 mg/dL   Alkaline Phosphatase 64 39 - 117 U/L   AST 15 0 - 37 U/L   ALT 10 0 - 35 U/L   Total Protein 7.7 6.0 - 8.3 g/dL   Albumin 3.9 3.5 - 5.2 g/dL   GFR 59.02 (L) >60.00 mL/min   Calcium 9.8 8.4 - 10.5 mg/dL  Vitamin B12  Result Value Ref Range   Vitamin B-12 279 211 - 911 pg/mL  POCT glycosylated hemoglobin (Hb A1C)  Result Value Ref Range   Hemoglobin A1C 6.4 (A) 4.0 - 5.6 %   HbA1c POC (<> result, manual entry)     HbA1c, POC (prediabetic range)     HbA1c, POC (controlled diabetic range)    HM DIABETES FOOT EXAM  Result Value Ref Range   HM Diabetic Foot Exam done      COVID 19 screen:  No recent travel or known exposure to COVID19 The patient denies respiratory symptoms of COVID 19 at this time. The importance of social distancing  was discussed today.   Assessment and Plan Problem List Items Addressed This Visit     Body mass index (BMI) of 32.0-32.9 in adult    Encouraged exercise, weight loss, healthy eating habits. Increase Mounjaro to 7.5 mg weekly.      Chronic constipation    Chronic, improved with Linzess she has gotten from her friend.  She is unclear of the dose but I will send in a prescription for Linzess 145 mg p.o. daily as needed constipation.  Continue working on increased water and fiber.      Hypertension associated with diabetes ([place])    Chronic,  well controlled when medication in her system.  Continue amlodipine 10 mg daily and losartan 50 mg daily.      Relevant Medications   tirzepatide (MOUNJARO) 7.5 MG/0.5ML Pen   Neuropathy due to type 2 diabetes mellitus (HCC)      Trial of ALA 600 mg daily      Relevant Medications   tirzepatide (MOUNJARO) 7.5 MG/0.5ML Pen   Type 2 diabetes mellitus with neurological complications (HCC) - Primary    Chronic, well controlled   Increase mounjaro to 7.5 mg weekly for more weight loss effect.   Continue  using CGM.      Relevant Medications   tirzepatide (MOUNJARO) 7.5 MG/0.5ML Pen   Other Visit Diagnoses     Need for influenza vaccination       Relevant Orders   Flu Vaccine QUAD High Dose(Fluad) (Completed)      Meds ordered this encounter  Medications   linaclotide (LINZESS) 145 MCG CAPS capsule    Sig: Take 1 capsule (145 mcg total) by mouth daily before breakfast.    Dispense:  30 capsule    Refill:  11   tirzepatide (MOUNJARO) 7.5 MG/0.5ML Pen    Sig: Inject 7.5 mg into the skin once a week.    Dispense:  6 mL    Refill:  11       Eliezer Lofts, MD

## 2021-10-14 NOTE — Assessment & Plan Note (Addendum)
Chronic, well controlled   Increase mounjaro to 7.5 mg weekly for more weight loss effect.   Continue using CGM.

## 2021-10-14 NOTE — Assessment & Plan Note (Signed)
Trial of ALA 600 mg daily

## 2021-10-14 NOTE — Patient Instructions (Addendum)
Increase Mounjaro to 7.5 mg weekly to stimulate more weight loss.  Make sure to take blood pressure medication daily.  Can try ALA 600 mg daily for diabetic neuropathy.

## 2021-10-14 NOTE — Assessment & Plan Note (Signed)
Chronic, improved with Linzess she has gotten from her friend.  She is unclear of the dose but I will send in a prescription for Linzess 145 mg p.o. daily as needed constipation.  Continue working on increased water and fiber.

## 2021-10-17 ENCOUNTER — Encounter: Payer: Self-pay | Admitting: Family Medicine

## 2021-10-17 ENCOUNTER — Other Ambulatory Visit: Payer: Self-pay

## 2021-10-19 ENCOUNTER — Ambulatory Visit: Payer: HMO

## 2021-10-19 ENCOUNTER — Other Ambulatory Visit: Payer: Self-pay

## 2021-10-21 ENCOUNTER — Telehealth: Payer: Self-pay | Admitting: *Deleted

## 2021-10-21 ENCOUNTER — Other Ambulatory Visit: Payer: Self-pay

## 2021-10-21 ENCOUNTER — Telehealth: Payer: Self-pay | Admitting: Family Medicine

## 2021-10-21 MED ORDER — OZEMPIC (0.25 OR 0.5 MG/DOSE) 2 MG/3ML ~~LOC~~ SOPN
0.2500 mg | PEN_INJECTOR | SUBCUTANEOUS | 11 refills | Status: DC
  Filled 2021-10-21 – 2021-10-28 (×3): qty 3, 28d supply, fill #0

## 2021-10-21 NOTE — Telephone Encounter (Signed)
-----   Message from [place] sent at 10/20/2021  4:32 PM EDT ----- Littie Deeds the Pharmacist explained to her that we cannot obtain [facility] and patient was ok with switching to Ozempic.  Thank you. ----- Message ----- From: Jinny Sanders, MD Sent: 10/20/2021   3:15 PM EDT To: Jacquelynn Cree, [employer]  Have you spoken with her about this? She requested Mounjarao specifically I believe.  If she is agreeable , let me know and I can send in [place] prescription. ----- Message ----- From: Chauncey Reading Sent: 10/19/2021   4:04 PM EDT To: Jinny Sanders, MD  Ms. Cleland is in the Medicare Part D coverage gap.  She has reached out to our program inquiring about receiving medication assistance for [place].  There is not a PAP program for Mounjaro.  We could obtain Ozempic from [employer] to replace the [facility].  Do you agree with switching the patient till her Part D starts over again in January?  If so, would you send a prescription for the Ozempic to [place]?  Thank you,  Alm Bustard

## 2021-10-21 NOTE — Telephone Encounter (Signed)
PA approved from 10/21/21 through 01/29/2022.

## 2021-10-21 NOTE — Telephone Encounter (Signed)
Received fax from [place] requesting PA for Ozempic 0.25 mg.  PA completed on CoverMyMeds and sent to [facility] for review.  Can take up to 72 hours for a decision.

## 2021-10-24 ENCOUNTER — Other Ambulatory Visit: Payer: Self-pay | Admitting: Pharmacy Technician

## 2021-10-24 ENCOUNTER — Other Ambulatory Visit: Payer: Self-pay

## 2021-10-24 NOTE — Patient Outreach (Signed)
Spoke with patient about providing medication assistance for Ozempic and Linzess.  Explained that there is not a PAP program for [facility].  However, we could obtain Ozempic.  Patient willing to try Ozempic.  Stated that Trulicity causes a lot of stomach distress.  Made patient aware that we need a copy of her 2023 proof of income from [employer], [place] Tax Return, copy of her Medicare Part D insurance card and printout from pharmacy of medications purchased since January 30, 2021, signed and dated by pharmacist.  Patient stated that she felt like it was too much information to provide.  Decided that she did not want to pursue the route of obtaining free medications from the pharmaceutical companies PAP programs.  Will call me if she changes her mind.  Jacquelynn Cree Patient Advocate Specialist [place] at [facility]

## 2021-10-25 ENCOUNTER — Other Ambulatory Visit: Payer: Self-pay

## 2021-10-25 ENCOUNTER — Ambulatory Visit
Admission: RE | Admit: 2021-10-25 | Discharge: 2021-10-25 | Disposition: A | Discharge status: Home / Self Care | Payer: HMO | Source: Ambulatory Visit | Attending: Urgent Care | Admitting: Urgent Care

## 2021-10-25 VITALS — BP 213/110 | HR 99 | Temp 98.2°F | Resp 16

## 2021-10-25 DIAGNOSIS — J069 Acute upper respiratory infection, unspecified: Secondary | ICD-10-CM

## 2021-10-25 DIAGNOSIS — Z79899 Other long term (current) drug therapy: Secondary | ICD-10-CM | POA: Diagnosis not present

## 2021-10-25 DIAGNOSIS — I10 Essential (primary) hypertension: Secondary | ICD-10-CM | POA: Insufficient documentation

## 2021-10-25 DIAGNOSIS — R519 Headache, unspecified: Secondary | ICD-10-CM | POA: Insufficient documentation

## 2021-10-25 DIAGNOSIS — U071 COVID-19: Secondary | ICD-10-CM | POA: Insufficient documentation

## 2021-10-25 LAB — RESP PANEL BY RT-PCR (RSV, FLU A&B, COVID)  RVPGX2
Influenza A by PCR: NEGATIVE
Influenza B by PCR: NEGATIVE
Resp Syncytial Virus by PCR: NEGATIVE
SARS Coronavirus 2 by RT PCR: POSITIVE — AB

## 2021-10-25 MED ORDER — BENZONATATE 100 MG PO CAPS
ORAL_CAPSULE | ORAL | 0 refills | Status: DC
  Filled 2021-10-25: qty 30, 5d supply, fill #0
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
## 2021-10-25 NOTE — ED Triage Notes (Signed)
Pt. States that yesterday she woke up with a cough, sinus pressure and nasal drainage. Pt. Has been treating herself w/ Tessalon pearls.

## 2021-10-25 NOTE — Discharge Instructions (Addendum)
Follow-up with your primary care provider if your symptoms worsen or do not resolve within 1 week.  Your respiratory swab is pending and will be posted to your MyChart account.

## 2021-10-25 NOTE — ED Notes (Signed)
Pt.'s blood pressure was  first assessed as 190/82 and reassessed @ 213/110. Pt. States she has skipped her B/P medications today. Pt. Was informed by nurse and Provider that her B/P is high and it would be in her best interest to take her B/P medication as soon as possible and to not skip future doses if not instructed by her provider. Pt. Showed understanding.

## 2021-10-26 ENCOUNTER — Ambulatory Visit: Payer: HMO

## 2021-10-27 ENCOUNTER — Ambulatory Visit: Payer: HMO

## 2021-10-28 ENCOUNTER — Other Ambulatory Visit: Payer: Self-pay

## 2021-10-31 ENCOUNTER — Encounter: Payer: Self-pay | Admitting: Family Medicine

## 2021-11-02 ENCOUNTER — Other Ambulatory Visit: Payer: Self-pay

## 2021-11-02 ENCOUNTER — Ambulatory Visit: Payer: HMO

## 2021-11-03 ENCOUNTER — Other Ambulatory Visit: Payer: Self-pay

## 2021-11-03 ENCOUNTER — Ambulatory Visit: Payer: HMO

## 2021-11-04 ENCOUNTER — Ambulatory Visit (INDEPENDENT_AMBULATORY_CARE_PROVIDER_SITE_OTHER): Payer: HMO | Admitting: Family Medicine

## 2021-11-04 ENCOUNTER — Encounter: Payer: Self-pay | Admitting: Family Medicine

## 2021-11-04 VITALS — BP 120/70 | HR 84 | Temp 98.1°F | Ht 61.0 in | Wt 164.5 lb

## 2021-11-04 DIAGNOSIS — R197 Diarrhea, unspecified: Secondary | ICD-10-CM | POA: Diagnosis not present

## 2021-11-04 DIAGNOSIS — U071 COVID-19: Secondary | ICD-10-CM

## 2021-11-04 LAB — CBC WITH DIFFERENTIAL/PLATELET
Basophils Absolute: 0 10*3/uL (ref 0.0–0.1)
Basophils Relative: 0.7 % (ref 0.0–3.0)
Eosinophils Absolute: 0.2 10*3/uL (ref 0.0–0.7)
Eosinophils Relative: 3.1 % (ref 0.0–5.0)
HCT: 39.5 % (ref 36.0–46.0)
Hemoglobin: 13.3 g/dL (ref 12.0–15.0)
Lymphocytes Relative: 39 % (ref 12.0–46.0)
Lymphs Abs: 2.1 10*3/uL (ref 0.7–4.0)
MCHC: 33.5 g/dL (ref 30.0–36.0)
MCV: 93.3 fl (ref 78.0–100.0)
Monocytes Absolute: 0.5 10*3/uL (ref 0.1–1.0)
Monocytes Relative: 8.5 % (ref 3.0–12.0)
Neutro Abs: 2.7 10*3/uL (ref 1.4–7.7)
Neutrophils Relative %: 48.7 % (ref 43.0–77.0)
Platelets: 269 10*3/uL (ref 150.0–400.0)
RBC: 4.23 Mil/uL (ref 3.87–5.11)
RDW: 13.2 % (ref 11.5–15.5)
WBC: 5.5 10*3/uL (ref 4.0–10.5)

## 2021-11-04 LAB — COMPREHENSIVE METABOLIC PANEL
ALT: 12 U/L (ref 0–35)
AST: 16 U/L (ref 0–37)
Albumin: 3.6 g/dL (ref 3.5–5.2)
Alkaline Phosphatase: 64 U/L (ref 39–117)
BUN: 27 mg/dL — ABNORMAL HIGH (ref 6–23)
CO2: 30 mEq/L (ref 19–32)
Calcium: 9.2 mg/dL (ref 8.4–10.5)
Chloride: 108 mEq/L (ref 96–112)
Creatinine, Ser: 1.28 mg/dL — ABNORMAL HIGH (ref 0.40–1.20)
GFR: 42.24 mL/min — ABNORMAL LOW (ref >60.00)
Glucose, Bld: 80 mg/dL (ref 70–99)
Potassium: 4 mEq/L (ref 3.5–5.1)
Sodium: 144 mEq/L (ref 135–145)
Total Bilirubin: 0.6 mg/dL (ref 0.2–1.2)
Total Protein: 6.9 g/dL (ref 6.0–8.3)

## 2021-11-04 NOTE — Assessment & Plan Note (Signed)
Acute flare for patient is likely secondary to COVID virus but she is known to have chronic intermittent diarrhea from likely IBS in the past.  I encouraged her to push fluids and return to bland diet.  She can use Imodium as needed.  We will reevaluate labs for cbc,  electrolyte issues and renal problems.

## 2021-11-04 NOTE — Assessment & Plan Note (Signed)
Acute, on day 10 of illness She seems to be improving for the most part.  Encouraged continued symptomatic and supportive care.  Keep up with fluids.

## 2021-11-04 NOTE — Patient Instructions (Signed)
Push fluids.  Can use immodium for diarrhea.  Stop at lab on way out.

## 2021-11-04 NOTE — Progress Notes (Signed)
Patient ID: Patricia Horne, female    DOB: 1950-12-20, 71 y.o.   MRN: [id number]  This visit was conducted in person.  BP 120/70   Pulse 84   Temp 98.1 F (36.7 C) (Oral)   Ht _0  (1.549 m)   Wt 164 lb 8 oz (74.6 kg)   SpO2 99%   BMI 31.08 kg/m    CC:  Chief Complaint  Patient presents with   Post Covid    Dx 10/25/21   Diarrhea        Nasal Congestion   Cough    Mild    Subjective:   HPI: Patricia Horne is a 71 y.o. female with DM presenting on 11/04/2021 for Post Covid (Dx 10/25/21), Diarrhea (/), Nasal Congestion, and Cough (Mild)   Date of onset  of COVID 10/25/2021  Had headache, runny nose and cough. No SOB, no fever.  Treated with  symptomatic care, benzonatate.    Started feeling better ut then diarrhea started 4 days ago. Pushed water.  No BM this AM.  She has been feeling very tired, weak feeling.   She held [facility] this week.   FBS  80-100 for the most part.      Relevant past medical, surgical, family and social history reviewed and updated as indicated. Interim medical history since our last visit reviewed. Allergies and medications reviewed and updated. Outpatient Medications Prior to Visit  Medication Sig Dispense Refill   acetaminophen (TYLENOL) 500 MG tablet Take 500 mg by mouth every 6 (six) hours as needed.     Cholecalciferol (D3-50) 1.25 MG (50000 UT) capsule Take by mouth once a week. 12 capsule 0   Continuous Blood Gluc Receiver (FREESTYLE LIBRE 14 DAY READER) DEVI 1 EACH BY DOES NOT APPLY ROUTE 3 (THREE) TIMES DAILY AS NEEDED. 1 each 0   Continuous Blood Gluc Sensor (FREESTYLE LIBRE 3 SENSOR) MISC Place 1 sensor on the skin every 14 days. Use to check glucose continuously 2 each 11   glucose monitoring kit (FREESTYLE) monitoring kit 1 each by Does not apply route as needed for other. 1 each 0   linaclotide (LINZESS) 145 MCG CAPS capsule Take 1 capsule (145 mcg total) by mouth daily before breakfast. 30 capsule 11   losartan  (COZAAR) 50 MG tablet Take 1 tablet (50 mg total) by mouth daily. 30 tablet 11   omeprazole (PRILOSEC) 20 MG capsule Take 1 capsule (20 mg total) by mouth daily. 30 capsule 3   tirzepatide (MOUNJARO) 7.5 MG/0.5ML Pen Inject 7.5 mg into the skin once a week. 6 mL 11   benzonatate (TESSALON) 100 MG capsule Take 1-2 tablets 3 times a day as needed for cough (Patient not taking: Reported on 11/04/2021) 30 capsule 0   Semaglutide,0.25 or 0.5MG/DOS, (OZEMPIC, 0.25 OR 0.5 MG/DOSE,) 2 MG/3ML SOPN Inject 0.25 mg into the skin once a week. Increase to 0.5 mg weekly if tolerating after 2 weeks. (Patient not taking: Reported on 11/04/2021) 3 mL 11   No facility-administered medications prior to visit.     Per HPI unless specifically indicated in ROS section below Review of Systems  Constitutional:  Negative for fatigue and fever.  HENT:  Negative for congestion.   Eyes:  Negative for pain.  Respiratory:  Negative for cough and shortness of breath.   Cardiovascular:  Negative for chest pain, palpitations and leg swelling.  Gastrointestinal:  Positive for diarrhea. Negative for abdominal pain.  Genitourinary:  Negative for dysuria and vaginal  bleeding.  Musculoskeletal:  Negative for back pain.  Neurological:  Negative for syncope, light-headedness and headaches.  Psychiatric/Behavioral:  Negative for dysphoric mood.    Objective:  BP 120/70   Pulse 84   Temp 98.1 F (36.7 C) (Oral)   Ht _0  (1.549 m)   Wt 164 lb 8 oz (74.6 kg)   SpO2 99%   BMI 31.08 kg/m   Wt Readings from Last 3 Encounters:  11/04/21 164 lb 8 oz (74.6 kg)  10/14/21 169 lb 8 oz (76.9 kg)  09/16/21 169 lb 8 oz (76.9 kg)      Physical Exam Constitutional:      General: She is not in acute distress.    Appearance: Normal appearance. She is well-developed. She is not ill-appearing or toxic-appearing.  HENT:     Head: Normocephalic.     Right Ear: Hearing, tympanic membrane, ear canal and external ear normal. Tympanic  membrane is not erythematous, retracted or bulging.     Left Ear: Hearing, tympanic membrane, ear canal and external ear normal. Tympanic membrane is not erythematous, retracted or bulging.     Nose: No mucosal edema or rhinorrhea.     Right Sinus: No maxillary sinus tenderness or frontal sinus tenderness.     Left Sinus: No maxillary sinus tenderness or frontal sinus tenderness.     Mouth/Throat:     Pharynx: Uvula midline.  Eyes:     General: Lids are normal. Lids are everted, no foreign bodies appreciated.     Conjunctiva/sclera: Conjunctivae normal.     Pupils: Pupils are equal, round, and reactive to light.  Neck:     Thyroid: No thyroid mass or thyromegaly.     Vascular: No carotid bruit.     Trachea: Trachea normal.  Cardiovascular:     Rate and Rhythm: Normal rate and regular rhythm.     Pulses: Normal pulses.     Heart sounds: Normal heart sounds, S1 normal and S2 normal. No murmur heard.    No friction rub. No gallop.  Pulmonary:     Effort: Pulmonary effort is normal. No tachypnea or respiratory distress.     Breath sounds: Normal breath sounds. No decreased breath sounds, wheezing, rhonchi or rales.  Abdominal:     General: Bowel sounds are normal.     Palpations: Abdomen is soft.     Tenderness: There is no abdominal tenderness.  Musculoskeletal:     Cervical back: Normal range of motion and neck supple.  Skin:    General: Skin is warm and dry.     Findings: No rash.  Neurological:     Mental Status: She is alert.  Psychiatric:        Mood and Affect: Mood is not anxious or depressed.        Speech: Speech normal.        Behavior: Behavior normal. Behavior is cooperative.        Thought Content: Thought content normal.        Judgment: Judgment normal.       Results for orders placed or performed during the hospital encounter of 10/25/21  Resp panel by RT-PCR (RSV, Flu A&B, Covid) Anterior Nasal Swab   Specimen: Anterior Nasal Swab  Result Value Ref Range    SARS Coronavirus 2 by RT PCR POSITIVE (A) NEGATIVE   Influenza A by PCR NEGATIVE NEGATIVE   Influenza B by PCR NEGATIVE NEGATIVE   Resp Syncytial Virus by PCR NEGATIVE NEGATIVE  COVID 19 screen:  No recent travel or known exposure to COVID19 The patient denies respiratory symptoms of COVID 19 at this time. The importance of social distancing was discussed today.   Assessment and Plan    Problem List Items Addressed This Visit     COVID-19 - Primary    Acute, on day 10 of illness She seems to be improving for the most part.  Encouraged continued symptomatic and supportive care.  Keep up with fluids.      Diarrhea    Acute flare for patient is likely secondary to COVID virus but she is known to have chronic intermittent diarrhea from likely IBS in the past.  I encouraged her to push fluids and return to bland diet.  She can use Imodium as needed.  We will reevaluate labs for cbc,  electrolyte issues and renal problems.      Relevant Orders   Comprehensive metabolic panel (Completed)   CBC with Differential/Platelet (Completed)     Eliezer Lofts, MD

## 2021-11-07 ENCOUNTER — Other Ambulatory Visit: Payer: Self-pay

## 2021-11-09 ENCOUNTER — Ambulatory Visit: Payer: HMO | Attending: Family Medicine

## 2021-11-09 DIAGNOSIS — M5459 Other low back pain: Secondary | ICD-10-CM | POA: Insufficient documentation

## 2021-11-09 DIAGNOSIS — M6281 Muscle weakness (generalized): Secondary | ICD-10-CM | POA: Diagnosis present
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
## 2021-11-10 ENCOUNTER — Ambulatory Visit: Payer: HMO

## 2021-11-10 DIAGNOSIS — M5459 Other low back pain: Secondary | ICD-10-CM | POA: Diagnosis not present

## 2021-11-10 DIAGNOSIS — M6281 Muscle weakness (generalized): Secondary | ICD-10-CM
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
## 2021-11-10 NOTE — Addendum Note (Signed)
Addended by: Joneen Boers R on: 11/10/2021 04:00 PM   Modules accepted: Orders

## 2021-11-11 ENCOUNTER — Ambulatory Visit: Payer: HMO | Admitting: Family Medicine

## 2021-11-16 ENCOUNTER — Ambulatory Visit: Payer: HMO

## 2021-11-16 ENCOUNTER — Other Ambulatory Visit: Payer: Self-pay

## 2021-11-16 DIAGNOSIS — M6281 Muscle weakness (generalized): Secondary | ICD-10-CM

## 2021-11-16 DIAGNOSIS — M5459 Other low back pain: Secondary | ICD-10-CM
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
## 2021-11-17 ENCOUNTER — Other Ambulatory Visit: Payer: Self-pay

## 2021-11-17 ENCOUNTER — Ambulatory Visit: Payer: HMO

## 2021-11-17 DIAGNOSIS — M5459 Other low back pain: Secondary | ICD-10-CM | POA: Diagnosis not present

## 2021-11-17 DIAGNOSIS — M6281 Muscle weakness (generalized): Secondary | ICD-10-CM
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
## 2021-11-18 ENCOUNTER — Other Ambulatory Visit: Payer: Self-pay

## 2021-11-21 ENCOUNTER — Other Ambulatory Visit: Payer: Self-pay

## 2021-11-22 ENCOUNTER — Other Ambulatory Visit: Payer: Self-pay

## 2021-11-23 ENCOUNTER — Ambulatory Visit: Payer: HMO

## 2021-11-23 DIAGNOSIS — M5459 Other low back pain: Secondary | ICD-10-CM

## 2021-11-23 DIAGNOSIS — M6281 Muscle weakness (generalized): Secondary | ICD-10-CM
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
## 2021-11-24 ENCOUNTER — Ambulatory Visit: Payer: HMO

## 2021-11-24 DIAGNOSIS — M6281 Muscle weakness (generalized): Secondary | ICD-10-CM

## 2021-11-24 DIAGNOSIS — M5459 Other low back pain: Secondary | ICD-10-CM
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
## 2021-11-25 ENCOUNTER — Other Ambulatory Visit: Payer: Self-pay

## 2021-12-07 ENCOUNTER — Other Ambulatory Visit: Payer: Self-pay

## 2021-12-09 ENCOUNTER — Encounter: Payer: Self-pay | Admitting: Family Medicine

## 2021-12-09 ENCOUNTER — Other Ambulatory Visit: Payer: Self-pay

## 2021-12-09 ENCOUNTER — Ambulatory Visit (INDEPENDENT_AMBULATORY_CARE_PROVIDER_SITE_OTHER): Payer: HMO | Admitting: Family Medicine

## 2021-12-09 VITALS — BP 174/87 | HR 74 | Temp 98.3°F | Ht 61.0 in | Wt 170.0 lb

## 2021-12-09 DIAGNOSIS — N3001 Acute cystitis with hematuria: Secondary | ICD-10-CM | POA: Diagnosis not present

## 2021-12-09 DIAGNOSIS — E1159 Type 2 diabetes mellitus with other circulatory complications: Secondary | ICD-10-CM | POA: Diagnosis not present

## 2021-12-09 DIAGNOSIS — I160 Hypertensive urgency: Secondary | ICD-10-CM

## 2021-12-09 DIAGNOSIS — B372 Candidiasis of skin and nail: Secondary | ICD-10-CM

## 2021-12-09 DIAGNOSIS — E1149 Type 2 diabetes mellitus with other diabetic neurological complication: Secondary | ICD-10-CM

## 2021-12-09 DIAGNOSIS — I152 Hypertension secondary to endocrine disorders: Secondary | ICD-10-CM

## 2021-12-09 LAB — POCT GLYCOSYLATED HEMOGLOBIN (HGB A1C): Hemoglobin A1C: 5.7 % — AB (ref 4.0–5.6)

## 2021-12-09 LAB — URINALYSIS, ROUTINE W REFLEX MICROSCOPIC
Bilirubin Urine: NEGATIVE
Ketones, ur: NEGATIVE
Leukocytes,Ua: NEGATIVE
Nitrite: NEGATIVE
Specific Gravity, Urine: >=1.03 — AB (ref 1.000–1.030)
Total Protein, Urine: 100 — AB
Urine Glucose: NEGATIVE
Urobilinogen, UA: 0.2 (ref 0.0–1.0)
pH: 5.5 (ref 5.0–8.0)

## 2021-12-09 LAB — MICROALBUMIN / CREATININE URINE RATIO
Creatinine,U: 0 mg/dL
Microalb Creat Ratio: 0 mg/g (ref 0.0–30.0)
Microalb, Ur: <0.7 mg/dL (ref 0.0–1.9)

## 2021-12-09 MED ORDER — NYSTATIN 100000 UNIT/GM EX CREA
1.0000 | TOPICAL_CREAM | Freq: Two times a day (BID) | CUTANEOUS | 0 refills | Status: DC
  Filled 2021-12-09: qty 30, 30d supply, fill #0

## 2021-12-09 NOTE — Assessment & Plan Note (Signed)
Initially in office blood pressure was very elevated.  She did take her blood pressure medication losartan 50 mg daily today.  Considered treatment with clonidine but as her blood pressure decreased back down to 174/84 by the end of the appointment we decided to just have her follow her blood pressure for the rest of the day at home.  She will call if it remains above 140/90 for possible increase in her losartan dose.

## 2021-12-09 NOTE — Assessment & Plan Note (Signed)
Recurrence.  Treat with topical nystatin.

## 2021-12-09 NOTE — Progress Notes (Signed)
Patient ID: Patricia Horne, female    DOB: [birth year], 71 y.o.   MRN: [id number]  This visit was conducted in person.  BP (!) 200/84 (BP Location: Left Arm, Cuff Size: Normal) Comment: Patient's Home BP Monitor  Pulse 80   Temp 98.3 F (36.8 C) (Oral)   Ht _0  (1.549 m)   Wt 170 lb (77.1 kg)   SpO2 96%   BMI 32.12 kg/m    CC:  Chief Complaint  Patient presents with   Diabetes   Hypertension    Subjective:   HPI: Patricia Horne is a 71 y.o. female presenting on 12/09/2021 for Diabetes and Hypertension  Hypertension:  Poor control, not consistent with taking medication regularly.  Increase stress. On losartan 50 mg daily. Did take this AM..  Given clonidine 0.1 mg today. BP Readings from Last 3 Encounters:  12/09/21 (!) 200/84  11/04/21 120/70  10/25/21 (!) 213/110  Using medication without problems or lightheadedness:  none Chest pain with exertion: none Edema: none Short of breath: occ coughing spells. Average home BPs: Other issues:  Wt Readings from Last 3 Encounters:  12/09/21 170 lb (77.1 kg)  11/04/21 164 lb 8 oz (74.6 kg)  10/14/21 169 lb 8 oz (76.9 kg)   Body mass index is 32.12 kg/m.   Diabetes:  A1C 5.7 excellent control on 7.5 mg weekly but no weight loss.  She has been unable to afford lately.. has not take in last 2 weeks.  Now in donut hole. Using medications without difficulties: Hypoglycemic episodes: Hyperglycemic episodes: Feet problems: Blood Sugars averaging: FBS 127 eye exam within last year: yes       Relevant past medical, surgical, family and social history reviewed and updated as indicated. Interim medical history since our last visit reviewed. Allergies and medications reviewed and updated. Outpatient Medications Prior to Visit  Medication Sig Dispense Refill   acetaminophen (TYLENOL) 500 MG tablet Take 500 mg by mouth every 6 (six) hours as needed.     Cholecalciferol (D3-50) 1.25 MG (50000 UT) capsule Take by  mouth once a week. 12 capsule 0   Continuous Blood Gluc Receiver (FREESTYLE LIBRE 14 DAY READER) DEVI 1 EACH BY DOES NOT APPLY ROUTE 3 (THREE) TIMES DAILY AS NEEDED. 1 each 0   Continuous Blood Gluc Sensor (FREESTYLE LIBRE 3 SENSOR) MISC Place 1 sensor on the skin every 14 days. Use to check glucose continuously 2 each 11   glucose monitoring kit (FREESTYLE) monitoring kit 1 each by Does not apply route as needed for other. 1 each 0   linaclotide (LINZESS) 145 MCG CAPS capsule Take 1 capsule (145 mcg total) by mouth daily before breakfast. 30 capsule 11   losartan (COZAAR) 50 MG tablet Take 1 tablet (50 mg total) by mouth daily. 30 tablet 11   omeprazole (PRILOSEC) 20 MG capsule Take 1 capsule (20 mg total) by mouth daily. 30 capsule 3   tirzepatide (MOUNJARO) 7.5 MG/0.5ML Pen Inject 7.5 mg into the skin once a week. 6 mL 11   benzonatate (TESSALON) 100 MG capsule Take 1-2 tablets 3 times a day as needed for cough (Patient not taking: Reported on 11/04/2021) 30 capsule 0   Semaglutide,0.25 or 0.5MG/DOS, (OZEMPIC, 0.25 OR 0.5 MG/DOSE,) 2 MG/3ML SOPN Inject 0.25 mg into the skin once a week. Increase to 0.5 mg weekly if tolerating after 2 weeks. (Patient not taking: Reported on 11/04/2021) 3 mL 11   No facility-administered medications prior to visit.  Per HPI unless specifically indicated in ROS section below Review of Systems  Constitutional:  Negative for fatigue and fever.  HENT:  Negative for congestion and ear pain.   Eyes:  Negative for pain.  Respiratory:  Negative for cough, chest tightness and shortness of breath.   Cardiovascular:  Negative for chest pain, palpitations and leg swelling.  Gastrointestinal:  Negative for abdominal pain.  Genitourinary:  Negative for dysuria and vaginal bleeding.  Musculoskeletal:  Negative for back pain.  Neurological:  Positive for headaches. Negative for syncope and light-headedness.  Psychiatric/Behavioral:  Negative for dysphoric mood.     Objective:  BP (!) 200/84 (BP Location: Left Arm, Cuff Size: Normal) Comment: Patient's Home BP Monitor  Pulse 80   Temp 98.3 F (36.8 C) (Oral)   Ht _0  (1.549 m)   Wt 170 lb (77.1 kg)   SpO2 96%   BMI 32.12 kg/m   Wt Readings from Last 3 Encounters:  12/09/21 170 lb (77.1 kg)  11/04/21 164 lb 8 oz (74.6 kg)  10/14/21 169 lb 8 oz (76.9 kg)      Physical Exam Constitutional:      General: She is not in acute distress.    Appearance: Normal appearance. She is well-developed. She is not ill-appearing or toxic-appearing.  HENT:     Head: Normocephalic.     Right Ear: Hearing, tympanic membrane, ear canal and external ear normal. Tympanic membrane is not erythematous, retracted or bulging.     Left Ear: Hearing, tympanic membrane, ear canal and external ear normal. Tympanic membrane is not erythematous, retracted or bulging.     Nose: No mucosal edema or rhinorrhea.     Right Sinus: No maxillary sinus tenderness or frontal sinus tenderness.     Left Sinus: No maxillary sinus tenderness or frontal sinus tenderness.     Mouth/Throat:     Pharynx: Uvula midline.  Eyes:     General: Lids are normal. Lids are everted, no foreign bodies appreciated.     Conjunctiva/sclera: Conjunctivae normal.     Pupils: Pupils are equal, round, and reactive to light.  Neck:     Thyroid: No thyroid mass or thyromegaly.     Vascular: No carotid bruit.     Trachea: Trachea normal.  Cardiovascular:     Rate and Rhythm: Normal rate and regular rhythm.     Pulses: Normal pulses.     Heart sounds: Normal heart sounds, S1 normal and S2 normal. No murmur heard.    No friction rub. No gallop.  Pulmonary:     Effort: Pulmonary effort is normal. No tachypnea or respiratory distress.     Breath sounds: Normal breath sounds. No decreased breath sounds, wheezing, rhonchi or rales.  Abdominal:     General: Bowel sounds are normal.     Palpations: Abdomen is soft.     Tenderness: There is no abdominal  tenderness.  Musculoskeletal:     Cervical back: Normal range of motion and neck supple.  Skin:    General: Skin is warm and dry.     Findings: No rash.  Neurological:     Mental Status: She is alert.  Psychiatric:        Mood and Affect: Mood is not anxious or depressed.        Speech: Speech normal.        Behavior: Behavior normal. Behavior is cooperative.        Thought Content: Thought content normal.  Judgment: Judgment normal.       Results for orders placed or performed in visit on 11/04/21  Comprehensive metabolic panel  Result Value Ref Range   Sodium 144 135 - 145 mEq/L   Potassium 4.0 3.5 - 5.1 mEq/L   Chloride 108 96 - 112 mEq/L   CO2 30 19 - 32 mEq/L   Glucose, Bld 80 70 - 99 mg/dL   BUN 27 (H) 6 - 23 mg/dL   Creatinine, Ser 1.28 (H) 0.40 - 1.20 mg/dL   Total Bilirubin 0.6 0.2 - 1.2 mg/dL   Alkaline Phosphatase 64 39 - 117 U/L   AST 16 0 - 37 U/L   ALT 12 0 - 35 U/L   Total Protein 6.9 6.0 - 8.3 g/dL   Albumin 3.6 3.5 - 5.2 g/dL   GFR 42.24 (L) >60.00 mL/min   Calcium 9.2 8.4 - 10.5 mg/dL  CBC with Differential/Platelet  Result Value Ref Range   WBC 5.5 4.0 - 10.5 K/uL   RBC 4.23 3.87 - 5.11 Mil/uL   Hemoglobin 13.3 12.0 - 15.0 g/dL   HCT 39.5 36.0 - 46.0 %   MCV 93.3 78.0 - 100.0 fl   MCHC 33.5 30.0 - 36.0 g/dL   RDW 13.2 11.5 - 15.5 %   Platelets 269.0 150.0 - 400.0 K/uL   Neutrophils Relative % 48.7 43.0 - 77.0 %   Lymphocytes Relative 39.0 12.0 - 46.0 %   Monocytes Relative 8.5 3.0 - 12.0 %   Eosinophils Relative 3.1 0.0 - 5.0 %   Basophils Relative 0.7 0.0 - 3.0 %   Neutro Abs 2.7 1.4 - 7.7 K/uL   Lymphs Abs 2.1 0.7 - 4.0 K/uL   Monocytes Absolute 0.5 0.1 - 1.0 K/uL   Eosinophils Absolute 0.2 0.0 - 0.7 K/uL   Basophils Absolute 0.0 0.0 - 0.1 K/uL     COVID 19 screen:  No recent travel or known exposure to COVID19 The patient denies respiratory symptoms of COVID 19 at this time. The importance of social distancing was discussed  today.   Assessment and Plan Problem List Items Addressed This Visit     Candidal intertrigo    Recurrence.  Treat with topical nystatin.       Relevant Medications   nystatin cream (MYCOSTATIN)   Hypertension associated with diabetes ([place])   Relevant Orders   AMB Referral to [place]   Hypertensive urgency    Initially in office blood pressure was very elevated.  She did take her blood pressure medication losartan 50 mg daily today.  Considered treatment with clonidine but as her blood pressure decreased back down to 174/84 by the end of the appointment we decided to just have her follow her blood pressure for the rest of the day at home.  She will call if it remains above 140/90 for possible increase in her losartan dose.       Type 2 diabetes mellitus with neurological complications (HCC) - Primary    Chronic, excellent control with Mounjaro with both blood sugar control and initial weight loss. Blood sugars seem to be trending up some as well as she has had 6 pound weight gain in the last 2 weeks since she has not been able to afford Mounjaro.  I have made a referral to [facility] pharmacist for assistance with making medication less cost prohibitive. Patient has tried multiple other options in the past and this is the best medication for her without side effects and allows her  to be compliant with her medication.      Relevant Orders   POCT glycosylated hemoglobin (Hb A1C) (Completed)   AMB Referral to [place] / creatinine urine ratio   Meds ordered this encounter  Medications   nystatin cream (MYCOSTATIN)    Sig: Apply 1 Application topically 2 (two) times daily.    Dispense:  30 g    Refill:  0   Orders Placed This Encounter  Procedures   Microalbumin / creatinine urine ratio    Standing Status:   Future    Number of Occurrences:   1    Standing Expiration Date:   12/09/2022   AMB Referral to [facility] Coordinaton     Referral Priority:   Routine    Referral Type:   Consultation    Referral Reason:   Pharmacy Medication Management    Number of Visits Requested:   1   POCT glycosylated hemoglobin (Hb A1C)       Eliezer Lofts, MD

## 2021-12-09 NOTE — Patient Instructions (Addendum)
Put BP med in bathroom with tooth brush to help with compliance. Work on low [employer] and regular exercise.

## 2021-12-09 NOTE — Assessment & Plan Note (Signed)
Chronic, usually well controlled around 120/70 when she is consistent with her medication.  Elevated in office today see note regarding hypertensive urgency. We discussed ways for her to be more compliant with her medications such as putting her medicine with her toothbrush etc.  Losartan 50 mg p.o. daily

## 2021-12-09 NOTE — Assessment & Plan Note (Signed)
Chronic, excellent control with Mounjaro with both blood sugar control and initial weight loss. Blood sugars seem to be trending up some as well as she has had 6 pound weight gain in the last 2 weeks since she has not been able to afford Mounjaro.  I have made a referral to [facility] pharmacist for assistance with making medication less cost prohibitive. Patient has tried multiple other options in the past and this is the best medication for her without side effects and allows her to be compliant with her medication.

## 2021-12-13 ENCOUNTER — Telehealth: Payer: Self-pay | Admitting: Family Medicine

## 2021-12-13 ENCOUNTER — Other Ambulatory Visit: Payer: Self-pay

## 2021-12-13 ENCOUNTER — Other Ambulatory Visit (HOSPITAL_COMMUNITY): Payer: Self-pay

## 2021-12-13 NOTE — Telephone Encounter (Signed)
Pharmacy Patient Advocate Encounter  Received notification that a PA was needed for [facility]   Per Test Claim: Darcel Bayley is covered, no PA needed. Copay $234.02 for 28 day supply

## 2021-12-13 NOTE — Telephone Encounter (Signed)
Patient called in and stated that [place] stated that she will need an PA for the [facility]. Thank you!

## 2021-12-13 NOTE — Telephone Encounter (Addendum)
Unfortunately Mounjaro and Trulicity do not have any assistance available. Only Ozempic has patient assistance (through [employer]) to for [employer] patients. It will likely take at least a month to get submit the application and get approval/shipment of Ozempic to the patient.   It is noted previously that patient did not want to pursue PAP due to not wanting to provide proof of income. Notably the new PAP form for Ozempic does not require this - it is only required to write in annual income and sign the document now.

## 2021-12-13 NOTE — Telephone Encounter (Signed)
Lilie notified as instructed by telephone.  She states she can't afford to pay $234.02 a month.  FYI to Dr. Diona Browner.

## 2021-12-13 NOTE — Telephone Encounter (Signed)
Patient called in and stated that she is having issues with getting medication for her diabetes. Informed patient she has a telephone call scheduled for 01/02/2022 but she stated she needs her medication now. Please advise. Thank you!

## 2021-12-13 NOTE — Telephone Encounter (Signed)
See other phone note from today -team is addressing cost issue with [facility]

## 2021-12-13 NOTE — Telephone Encounter (Signed)
Hello, Patient states she is unable to afford this amount for [facility].  Would Ozempic or Trulicity be covered better?

## 2021-12-28 ENCOUNTER — Telehealth: Payer: Self-pay

## 2021-12-28 NOTE — Chronic Care Management (AMB) (Signed)
    Chronic Care Management Pharmacy Assistant   Name: Patricia Horne  MRN: [id number] DOB: 1950-10-10   Reason for Encounter: Reminder Call   Unsuccessful outreach with patient since October 2022.   Conditions to be addressed/monitored: HTN and DMII  Medications: Outpatient Encounter Medications as of 12/28/2021  Medication Sig   acetaminophen (TYLENOL) 500 MG tablet Take 500 mg by mouth every 6 (six) hours as needed.   Cholecalciferol (D3-50) 1.25 MG (50000 UT) capsule Take by mouth once a week.   Continuous Blood Gluc Receiver (FREESTYLE LIBRE 14 DAY READER) DEVI 1 EACH BY DOES NOT APPLY ROUTE 3 (THREE) TIMES DAILY AS NEEDED.   Continuous Blood Gluc Sensor (FREESTYLE LIBRE 3 SENSOR) MISC Place 1 sensor on the skin every 14 days. Use to check glucose continuously   glucose monitoring kit (FREESTYLE) monitoring kit 1 each by Does not apply route as needed for other.   linaclotide (LINZESS) 145 MCG CAPS capsule Take 1 capsule (145 mcg total) by mouth daily before breakfast.   losartan (COZAAR) 50 MG tablet Take 1 tablet (50 mg total) by mouth daily.   nystatin cream (MYCOSTATIN) Apply 1 Application topically 2 (two) times daily.   omeprazole (PRILOSEC) 20 MG capsule Take 1 capsule (20 mg total) by mouth daily.   tirzepatide (MOUNJARO) 7.5 MG/0.5ML Pen Inject 7.5 mg into the skin once a week.   No facility-administered encounter medications on file as of 12/28/2021.   Rilya Yaslin Kirtley was contacted to remind of upcoming telephone visit with Charlene Brooke on 01/02/22 at 9:45. Patient was reminded to have any blood glucose and blood pressure readings available for review at appointment.   Message was left reminding patient of appointment. Vmbox full   CCM referral has been placed prior to visit?  Yes   Star Rating Drugs: Medication:  Last Fill: Day Supply Losartan 82m 09/02/21  30  Unable to reach to verify   LCharlene Brooke CPP notified  VAvel Sensor [place] [phone]

## 2022-01-02 ENCOUNTER — Telehealth: Payer: HMO

## 2022-01-02 ENCOUNTER — Telehealth: Payer: Self-pay | Admitting: Pharmacist

## 2022-01-02 NOTE — Telephone Encounter (Signed)
Spoke with patient regarding Patricia Horne cost. She has been without Mounjaro for a couple months due to high cost in donut hole. Unfortunately there are not any assistance programs for [facility] yet. She would qualify for Ozempic assistance and she agreed to pursue Novo Cares PAP.  Completed Novo Cares application online, expect approval and shipment of medication to office within 4-6 weeks.  Recommend to start Ozempic 0.25 mg weekly x 4 weeks, then increase to 0.5 mg weekly as tolerated.

## 2022-01-10 ENCOUNTER — Telehealth: Payer: Self-pay

## 2022-01-10 NOTE — Chronic Care Management (AMB) (Addendum)
Forms complete from [employer] for [employer] 2024. Forms sent for review.  Charlene Brooke, CPP notified  Avel Sensor, [place]  [phone]

## 2022-01-17 NOTE — Telephone Encounter (Signed)
Multiple attempts to reach [employer] to inquire patients status of application. Automated system did not recognize my input and stated unable to process request. Kept on hold indefinitely . Unsuccessful outreach  Patricia Horne, CPP notified  Avel Sensor, [place]  [phone]

## 2022-01-19 ENCOUNTER — Other Ambulatory Visit: Payer: Self-pay | Admitting: Family Medicine

## 2022-01-19 ENCOUNTER — Other Ambulatory Visit: Payer: Self-pay

## 2022-01-19 NOTE — Telephone Encounter (Signed)
Last office visit 12/09/21 for DM/HTN.  Last refilled 07/01/21 for #12 with no refills.  Last Vit D level 08/12/21 which was normal at 54.36 ng/mL.  Next Appt: 03/17/22 for 3 month follow up.

## 2022-01-29 ENCOUNTER — Encounter: Payer: Self-pay | Admitting: Family Medicine

## 2022-01-31 NOTE — Telephone Encounter (Signed)
Please advise, thank you!  [email or web address]

## 2022-02-08 ENCOUNTER — Telehealth: Payer: Self-pay | Admitting: Pharmacist

## 2022-02-08 NOTE — Telephone Encounter (Signed)
Received message from patient, she is checking on Ozempic PAP application.  We have not received any fax communication from [facility] since application was submitted online 01/02/22. [employer], via their automated system there is missing information on the application. The automated system does not provide specifics and wait times are >1 hr to speak with a representative.  Pharmacy team will contact [place] to follow up on application to see what missing info they need.

## 2022-02-10 NOTE — Telephone Encounter (Cosign Needed)
Contacted the patient and explained multiple attempts for the last several days to reach out to manufacturer regarding Ozempic. Patient states she has had a few hyperglycemic episodes as she has no medication. She has not tried ozempic yet, tried trulicity, and mounjaro, constipation was some side effects. Patient stated she could tolerate some constipation. Will await response from manufacturer.  Charlene Brooke, pharmD notified  Patricia Horne, [place]  [phone]

## 2022-02-15 DIAGNOSIS — E113312 Type 2 diabetes mellitus with moderate nonproliferative diabetic retinopathy with macular edema, left eye: Secondary | ICD-10-CM | POA: Diagnosis not present

## 2022-02-27 NOTE — Telephone Encounter (Cosign Needed)
Contacted the patient with Ozempic approval and told her medication will be shipped to [facility]. She will get notified to pick up.   Avel Sensor, [place] Assistant [phone]

## 2022-02-27 NOTE — Telephone Encounter (Signed)
Received update from [place] PAP was approved and will be shipped to office within 10-14 days. We will alert patient when it arrives. If pt needs help using pen (different pen than Mounjaro or Trulicity), please alert me.  [place] - please inform patient.

## 2022-03-01 ENCOUNTER — Telehealth: Payer: Self-pay | Admitting: *Deleted

## 2022-03-01 ENCOUNTER — Encounter: Payer: Self-pay | Admitting: *Deleted

## 2022-03-01 NOTE — Telephone Encounter (Signed)
Received from [facility] PAP:  Ozempic 0.25/0.5 mg 1-3 ml refilled pen.  Lot # [id number] Exp: 10/30/2023.  Patient voicemail is full so I was unable to leave her a message.  MyChart message sent to let her know the Ozempic is here at the office ready to be picked up.

## 2022-03-06 ENCOUNTER — Other Ambulatory Visit: Payer: Self-pay

## 2022-03-06 ENCOUNTER — Ambulatory Visit: Payer: HMO

## 2022-03-06 DIAGNOSIS — E1149 Type 2 diabetes mellitus with other diabetic neurological complication: Secondary | ICD-10-CM

## 2022-03-06 MED ORDER — NYSTATIN 100000 UNIT/GM EX POWD
1.0000 | Freq: Three times a day (TID) | CUTANEOUS | 0 refills | Status: DC
  Filled 2022-03-06: qty 60, 20d supply, fill #0

## 2022-03-06 NOTE — Telephone Encounter (Signed)
Pt here for nurse visit also reported that yeast infection to abdominal fold has not improved.  Writer assessed area, appears red, inflamed and moist.  Pt currently using nystatin cream BID and reports that she is cleaning area with soap and water and drying well before application.  Due to the area being moist, may be beneficial to change treatment to nystatin powder to keep the area dry.  Have pended a prescription if you agree.

## 2022-03-06 NOTE — Progress Notes (Signed)
Pt requested teaching on new medication Ozempic.  Pt shown video from [place] website that went over how to use pen and side effects to report.  Pt has previously taken other injectable medications and was not ready to take medication at the time of visit.  She plans to take 1st dose tomorrow 03/07/22.  Pt is aware that she is to take 0.'25mg'$  per week for 4 weeks then increase to 0.'5mg'$  weekly.

## 2022-03-09 ENCOUNTER — Other Ambulatory Visit: Payer: Self-pay

## 2022-03-13 ENCOUNTER — Encounter: Payer: Self-pay | Admitting: Pharmacist

## 2022-03-17 ENCOUNTER — Ambulatory Visit: Payer: HMO | Admitting: Family Medicine

## 2022-03-22 ENCOUNTER — Ambulatory Visit: Payer: HMO | Admitting: Family Medicine

## 2022-04-04 ENCOUNTER — Encounter: Payer: Self-pay | Admitting: Pharmacist

## 2022-04-06 ENCOUNTER — Telehealth: Payer: Self-pay

## 2022-04-06 NOTE — Progress Notes (Signed)
Care Management & Coordination Services Pharmacy Team  Reason for Encounter: General Adherence update   Contacted patient for general health update and medication adherence call.  Spoke with patient on 04/18/2022    What concerns do you have about your medications?Patient reports no concerns at this time   The patient denies side effects with their medications.   How often do you forget or accidentally miss a dose? Rarely  Do you use a pillbox? No  Are you having any problems getting your medications from your pharmacy?  Patient reports no problems with hospital pharmacy   Has the cost of your medications been a concern? Yes Ozempic assistance from manufacturer was approved for the patient for year 2024  Since last visit with PharmD, the following interventions have been made.  Patient approved for Ozempic 02/23/22  The patient has not had an ED visit since last contact.   The patient denies problems with their health.   Patient denies concerns or questions for Charlene Brooke , PharmD at this time.   Counseled patient on: Doristine Devoid job taking medications, Importance of taking medication daily without missed doses, and Access to carecoordination team for any cost, medication or pharmacy concerns.   Chart Updates:  Recent office visits:  03/06/22-Ashley Thompson,RN([facility])-  DM medication teaching,video.  Recent consult visits:  01/04/22-Teodoro Toledo,MD([facility])-constipation,titrate linzess,adequate intake fiber and water,due for colonoscopy  Hospital visits:  10/25/21-[facility] [place]-cough,HA, start Benzonatate 100mg  3 times daily, no admission   Medications: Outpatient Encounter Medications as of 04/06/2022  Medication Sig   acetaminophen (TYLENOL) 500 MG tablet Take 500 mg by mouth every 6 (six) hours as needed.   Cholecalciferol (D3-50) 1.25 MG (50000 UT) capsule Take by mouth once a week.   Continuous Blood Gluc Receiver (FREESTYLE LIBRE 14 DAY READER)  DEVI 1 EACH BY DOES NOT APPLY ROUTE 3 (THREE) TIMES DAILY AS NEEDED.   Continuous Blood Gluc Sensor (FREESTYLE LIBRE 3 SENSOR) MISC Place 1 sensor on the skin every 14 days. Use to check glucose continuously   glucose monitoring kit (FREESTYLE) monitoring kit 1 each by Does not apply route as needed for other.   linaclotide (LINZESS) 145 MCG CAPS capsule Take 1 capsule (145 mcg total) by mouth daily before breakfast.   losartan (COZAAR) 50 MG tablet Take 1 tablet (50 mg total) by mouth daily.   nystatin (MYCOSTATIN/NYSTOP) powder Apply 1 Application topically 3 (three) times daily.   nystatin cream (MYCOSTATIN) Apply 1 Application topically 2 (two) times daily.   omeprazole (PRILOSEC) 20 MG capsule Take 1 capsule (20 mg total) by mouth daily.   Semaglutide,0.25 or 0.5MG /DOS, (OZEMPIC, 0.25 OR 0.5 MG/DOSE,) 2 MG/3ML SOPN Inject 0.25 mg into the skin once a week. For 4 weeks then increase to 0.5 mg weekly. VIA NOVO CARES PAP   No facility-administered encounter medications on file as of 04/06/2022.    Recent vitals BP Readings from Last 3 Encounters:  12/09/21 (!) 174/87  11/04/21 120/70  10/25/21 (!) 213/110   Pulse Readings from Last 3 Encounters:  12/09/21 74  11/04/21 84  10/25/21 99   Wt Readings from Last 3 Encounters:  12/09/21 170 lb (77.1 kg)  11/04/21 164 lb 8 oz (74.6 kg)  10/14/21 169 lb 8 oz (76.9 kg)   BMI Readings from Last 3 Encounters:  12/09/21 32.12 kg/m  11/04/21 31.08 kg/m  10/14/21 32.03 kg/m    Recent lab results    Component Value Date/Time   NA 144 11/04/2021 1201  NA 135 (L) 09/30/2012 1353   K 4.0 11/04/2021 1201   K 3.7 09/30/2012 1353   CL 108 11/04/2021 1201   CL 101 09/30/2012 1353   CO2 30 11/04/2021 1201   CO2 29 09/30/2012 1353   GLUCOSE 80 11/04/2021 1201   GLUCOSE 230 (H) 09/30/2012 1353   BUN 27 (H) 11/04/2021 1201   BUN 10 09/30/2012 1353   CREATININE 1.28 (H) 11/04/2021 1201   CREATININE 0.61 09/30/2012 1353   CALCIUM 9.2  11/04/2021 1201   CALCIUM 9.2 09/30/2012 1353    Lab Results  Component Value Date   CREATININE 1.28 (H) 11/04/2021   GFR 42.24 (L) 11/04/2021   GFRNONAA >60 09/30/2012   GFRAA >60 09/30/2012   Lab Results  Component Value Date/Time   HGBA1C 5.7 (A) 12/09/2021 11:30 AM   HGBA1C 6.4 (A) 08/12/2021 09:10 AM   HGBA1C 7.0 (H) 06/24/2021 09:00 AM   HGBA1C 9.6 (H) 11/30/2020 10:29 AM   HGBA1C 13.9 06/05/2017 12:00 AM   MICROALBUR <0.7 12/09/2021 12:56 PM   MICROALBUR 1.9 04/13/2016 09:47 AM    Lab Results  Component Value Date   CHOL 163 06/24/2021   HDL 46.60 06/24/2021   LDLCALC 96 06/24/2021   TRIG 99.0 06/24/2021   CHOLHDL 3 06/24/2021    Care Gaps: Annual wellness visit in last year? Yes  If Diabetic: Last eye exam / retinopathy screening:2022 Last diabetic foot exam:UTD Last UACR: 0.0  Star Rating Drugs:    Patient uses hospital pharmacy Medication:  Last Fill: Day Supply    Losartan 50mg  01/20/22 30 Ozempic   manufacturer  Charlene Brooke, PharmD notified  Avel Sensor, [place] Assistant [phone]

## 2022-04-12 DIAGNOSIS — E113312 Type 2 diabetes mellitus with moderate nonproliferative diabetic retinopathy with macular edema, left eye: Secondary | ICD-10-CM | POA: Diagnosis not present

## 2022-04-13 ENCOUNTER — Other Ambulatory Visit: Payer: Self-pay

## 2022-04-21 ENCOUNTER — Encounter: Payer: Self-pay | Admitting: Family Medicine

## 2022-04-21 ENCOUNTER — Ambulatory Visit (INDEPENDENT_AMBULATORY_CARE_PROVIDER_SITE_OTHER): Payer: PPO | Admitting: Family Medicine

## 2022-04-21 VITALS — BP 182/88 | HR 70 | Temp 97.7°F | Ht 61.0 in | Wt 174.0 lb

## 2022-04-21 DIAGNOSIS — F4321 Adjustment disorder with depressed mood: Secondary | ICD-10-CM

## 2022-04-21 DIAGNOSIS — I160 Hypertensive urgency: Secondary | ICD-10-CM

## 2022-04-21 DIAGNOSIS — E1149 Type 2 diabetes mellitus with other diabetic neurological complication: Secondary | ICD-10-CM

## 2022-04-21 DIAGNOSIS — E1159 Type 2 diabetes mellitus with other circulatory complications: Secondary | ICD-10-CM | POA: Diagnosis not present

## 2022-04-21 DIAGNOSIS — I152 Hypertension secondary to endocrine disorders: Secondary | ICD-10-CM

## 2022-04-21 LAB — POCT GLYCOSYLATED HEMOGLOBIN (HGB A1C): Hemoglobin A1C: 6.7 % — AB (ref 4.0–5.6)

## 2022-04-21 MED ORDER — CLONIDINE HCL 0.1 MG PO TABS
0.1000 mg | ORAL_TABLET | Freq: Once | ORAL | Status: AC
  Administered 2022-04-21: 0.1 mg via ORAL

## 2022-04-21 NOTE — Progress Notes (Addendum)
Patient ID: Patricia Horne, female    DOB: 09-28-1950, 72 y.o.   MRN: [id number]  This visit was conducted in person.  BP (!) 174/100   Pulse 73   Temp 97.7 F (36.5 C) (Temporal)   Ht 5\' 1"  (1.549 m)   Wt 174 lb (78.9 kg)   SpO2 96%   BMI 32.88 kg/m    CC:  Chief Complaint  Patient presents with   Hypertension   Diabetes    Subjective:   HPI: Patricia Horne is a 72 y.o. female presenting on 04/21/2022 for Hypertension and Diabetes She reports today she is having trouble dealing with grief with the loss of her brother in 2022-03-06.  She states she cries all the time.    Hypertension:  Poor control, not consistent with taking medication regularly.  Increase stress, brother passed away in 03/06/22.  On losartan 50 mg daily. Did take this AM..   BP Readings from Last 3 Encounters:  04/21/22 (!) 174/100  12/09/21 (!) 174/87  11/04/21 120/70  Using medication without problems or lightheadedness:  none Chest pain with exertion: none Edema: none Short of breath: occ coughing spells. Average home BPs: Other issues:  Wt Readings from Last 3 Encounters:  04/21/22 174 lb (78.9 kg)  12/09/21 170 lb (77.1 kg)  11/04/21 164 lb 8 oz (74.6 kg)   Body mass index is 32.88 kg/m.   Diabetes: Good control despite noncompliance with medications. She has not taken Ozempic in the last month. Lab Results  Component Value Date   HGBA1C 6.7 (A) 04/21/2022  Using medications without difficulties: Hypoglycemic episodes: Hyperglycemic episodes: Feet problems: Blood Sugars averaging: FBS 127 eye exam within last year: yes       Relevant past medical, surgical, family and social history reviewed and updated as indicated. Interim medical history since our last visit reviewed. Allergies and medications reviewed and updated. Outpatient Medications Prior to Visit  Medication Sig Dispense Refill   acetaminophen (TYLENOL) 500 MG tablet Take 500 mg by mouth every 6 (six) hours as  needed.     Continuous Blood Gluc Receiver (FREESTYLE LIBRE 14 DAY READER) DEVI 1 EACH BY DOES NOT APPLY ROUTE 3 (THREE) TIMES DAILY AS NEEDED. 1 each 0   Continuous Blood Gluc Sensor (FREESTYLE LIBRE 3 SENSOR) MISC Place 1 sensor on the skin every 14 days. Use to check glucose continuously 2 each 11   glucose monitoring kit (FREESTYLE) monitoring kit 1 each by Does not apply route as needed for other. 1 each 0   losartan (COZAAR) 50 MG tablet Take 1 tablet (50 mg total) by mouth daily. 30 tablet 11   omeprazole (PRILOSEC) 20 MG capsule Take 1 capsule (20 mg total) by mouth daily. 30 capsule 3   linaclotide (LINZESS) 145 MCG CAPS capsule Take 1 capsule (145 mcg total) by mouth daily before breakfast. (Patient not taking: Reported on 04/21/2022) 30 capsule 11   Semaglutide,0.25 or 0.5MG /DOS, (OZEMPIC, 0.25 OR 0.5 MG/DOSE,) 2 MG/3ML SOPN Inject 0.25 mg into the skin once a week. For 4 weeks then increase to 0.5 mg weekly. VIA NOVO CARES PAP (Patient not taking: Reported on 04/21/2022)     Cholecalciferol (D3-50) 1.25 MG (50000 UT) capsule Take by mouth once a week. 12 capsule 0   nystatin (MYCOSTATIN/NYSTOP) powder Apply 1 Application topically 3 (three) times daily. 60 g 0   nystatin cream (MYCOSTATIN) Apply 1 Application topically 2 (two) times daily. 30 g 0   No facility-administered medications  prior to visit.     Per HPI unless specifically indicated in ROS section below Review of Systems  Constitutional:  Negative for fatigue and fever.  HENT:  Negative for congestion and ear pain.   Eyes:  Negative for pain.  Respiratory:  Negative for cough, chest tightness and shortness of breath.   Cardiovascular:  Negative for chest pain, palpitations and leg swelling.  Gastrointestinal:  Negative for abdominal pain.  Genitourinary:  Negative for dysuria and vaginal bleeding.  Musculoskeletal:  Negative for back pain.  Neurological:  Positive for headaches. Negative for syncope and light-headedness.   Psychiatric/Behavioral:  Negative for dysphoric mood.    Objective:  BP (!) 174/100   Pulse 73   Temp 97.7 F (36.5 C) (Temporal)   Ht 5\' 1"  (1.549 m)   Wt 174 lb (78.9 kg)   SpO2 96%   BMI 32.88 kg/m   Wt Readings from Last 3 Encounters:  04/21/22 174 lb (78.9 kg)  12/09/21 170 lb (77.1 kg)  11/04/21 164 lb 8 oz (74.6 kg)      Physical Exam Constitutional:      General: She is not in acute distress.    Appearance: Normal appearance. She is well-developed. She is not ill-appearing or toxic-appearing.  HENT:     Head: Normocephalic.     Right Ear: Hearing, tympanic membrane, ear canal and external ear normal. Tympanic membrane is not erythematous, retracted or bulging.     Left Ear: Hearing, tympanic membrane, ear canal and external ear normal. Tympanic membrane is not erythematous, retracted or bulging.     Nose: No mucosal edema or rhinorrhea.     Right Sinus: No maxillary sinus tenderness or frontal sinus tenderness.     Left Sinus: No maxillary sinus tenderness or frontal sinus tenderness.     Mouth/Throat:     Pharynx: Uvula midline.  Eyes:     General: Lids are normal. Lids are everted, no foreign bodies appreciated.     Conjunctiva/sclera: Conjunctivae normal.     Pupils: Pupils are equal, round, and reactive to light.  Neck:     Thyroid: No thyroid mass or thyromegaly.     Vascular: No carotid bruit.     Trachea: Trachea normal.  Cardiovascular:     Rate and Rhythm: Normal rate and regular rhythm.     Pulses: Normal pulses.     Heart sounds: Normal heart sounds, S1 normal and S2 normal. No murmur heard.    No friction rub. No gallop.  Pulmonary:     Effort: Pulmonary effort is normal. No tachypnea or respiratory distress.     Breath sounds: Normal breath sounds. No decreased breath sounds, wheezing, rhonchi or rales.  Abdominal:     General: Bowel sounds are normal.     Palpations: Abdomen is soft.     Tenderness: There is no abdominal tenderness.   Musculoskeletal:     Cervical back: Normal range of motion and neck supple.  Skin:    General: Skin is warm and dry.     Findings: No rash.  Neurological:     Mental Status: She is alert.  Psychiatric:        Mood and Affect: Mood is not anxious or depressed.        Speech: Speech normal.        Behavior: Behavior normal. Behavior is cooperative.        Thought Content: Thought content normal.        Judgment: Judgment normal.  Results for orders placed or performed in visit on 04/21/22  POCT glycosylated hemoglobin (Hb A1C)  Result Value Ref Range   Hemoglobin A1C 6.7 (A) 4.0 - 5.6 %   HbA1c POC (<> result, manual entry)     HbA1c, POC (prediabetic range)     HbA1c, POC (controlled diabetic range)        04/21/2022   10:31 AM 03/30/2021    2:57 PM 11/29/2020    4:17 PM  Depression screen PHQ 2/9  Decreased Interest 1 0 0  Down, Depressed, Hopeless 1 0 0  PHQ - 2 Score 2 0 0  Altered sleeping 3    Tired, decreased energy 3    Change in appetite 0    Feeling bad or failure about yourself  1    Trouble concentrating 1    Moving slowly or fidgety/restless 0    Suicidal thoughts 0    PHQ-9 Score 10    Difficult doing work/chores Somewhat difficult        04/21/2022   10:31 AM  GAD 7 : Generalized Anxiety Score  Nervous, Anxious, on Edge 1  Control/stop worrying 2  Worry too much - different things 2  Trouble relaxing 1  Restless 0  Easily annoyed or irritable 2  Afraid - awful might happen 0  Total GAD 7 Score 8  Anxiety Difficulty Somewhat difficult      COVID 19 screen:  No recent travel or known exposure to COVID19 The patient denies respiratory symptoms of COVID 19 at this time. The importance of social distancing was discussed today.   Assessment and Plan Problem List Items Addressed This Visit     Grief    Acute, She is upset today in office which is likely contributing to increase in blood pressure as well as she has not been taking her blood  pressure medication recently.  We discussed counseling with her pastor versus counseling at work as options for stress reduction and relaxation. We did talk through some of her concerns regarding her brother's passing. She is not currently interested in medication at this time.      Hypertension associated with diabetes ([place])    Chronic, usually well controlled around 120/70 when she is consistent with her medication.  Elevated in office today see note regarding hypertensive urgency. We discussed ways for her to be more compliant with her medications such as putting her medicine with her toothbrush etc.  Losartan 50 mg p.o. daily      Hypertensive urgency    Acute.. treated I office today with clonidine 0.1 mg  x 2 .. With improvement in blood pressure to 182/88 HR 70  Discussed noncompliance with medication with the patient in detail, she  is at risk for for stroke if she does not take her blood pressure medication.      Type 2 diabetes mellitus with neurological complications (HCC) - Primary    Chronic, excellent control in past Mounjaro when she is compliant with medication..  Now changed to [place] for insurance coverage.  A1C trending up off Ozempic as she has not taken it last month   Encouraged patient to restart  ozempic and titrate up.      Relevant Orders   POCT glycosylated hemoglobin (Hb A1C) (Completed)   No orders of the defined types were placed in this encounter.  Orders Placed This Encounter  Procedures   POCT glycosylated hemoglobin (Hb A1C)       Eliezer Lofts, MD

## 2022-04-21 NOTE — Assessment & Plan Note (Signed)
Chronic, excellent control in past Mounjaro when she is compliant with medication..  Now changed to [place] for insurance coverage.  A1C trending up off Ozempic as she has not taken it last month   Encouraged patient to restart  ozempic and titrate up.

## 2022-04-21 NOTE — Assessment & Plan Note (Addendum)
Acute.. treated I office today with clonidine 0.1 mg  x 2 .. With improvement in blood pressure to 182/88 HR 70  Discussed noncompliance with medication with the patient in detail, she  is at risk for for stroke if she does not take her blood pressure medication.

## 2022-04-21 NOTE — Patient Instructions (Addendum)
Restart diabetes and blood pressure medication consistently as discussed.  If tolerating increase ozempic after 2 week s to 0.5 mg weekly. Follow blood pressure at home.  If blood pressure not at goal less than 140/90,

## 2022-04-21 NOTE — Assessment & Plan Note (Signed)
Acute, She is upset today in office which is likely contributing to increase in blood pressure as well as she has not been taking her blood pressure medication recently.  We discussed counseling with her pastor versus counseling at work as options for stress reduction and relaxation. We did talk through some of her concerns regarding her brother's passing. She is not currently interested in medication at this time.

## 2022-04-21 NOTE — Assessment & Plan Note (Signed)
Chronic, usually well controlled around 120/70 when she is consistent with her medication.  Elevated in office today see note regarding hypertensive urgency. We discussed ways for her to be more compliant with her medications such as putting her medicine with her toothbrush etc.  Losartan 50 mg p.o. daily 

## 2022-04-21 NOTE — Addendum Note (Signed)
Addended by: Carter Kitten on: 04/21/2022 12:31 PM   Modules accepted: Orders

## 2022-04-25 ENCOUNTER — Telehealth: Payer: Self-pay | Admitting: Family Medicine

## 2022-04-25 DIAGNOSIS — E113511 Type 2 diabetes mellitus with proliferative diabetic retinopathy with macular edema, right eye: Secondary | ICD-10-CM | POA: Diagnosis not present

## 2022-04-25 NOTE — Telephone Encounter (Signed)
Contacted [place] to schedule their annual wellness visit. Appointment made for 05/17/2022.  [place] Direct Dial: [phone]

## 2022-04-26 DIAGNOSIS — E113511 Type 2 diabetes mellitus with proliferative diabetic retinopathy with macular edema, right eye: Secondary | ICD-10-CM | POA: Diagnosis not present

## 2022-05-03 ENCOUNTER — Telehealth: Payer: Self-pay | Admitting: Pharmacist

## 2022-05-03 NOTE — Progress Notes (Signed)
Contacted patient due to failure of controlling blood pressure, adherence to medications for diabetes, adherence to medicatinos for hypertension, and statin use in diabetes measure for 2023. Left voicemail. Will also send patient MyChart message.   Catie Hedwig Morton, PharmD, [place], [facility] [phone]

## 2022-05-05 ENCOUNTER — Other Ambulatory Visit: Payer: PPO | Admitting: Pharmacist

## 2022-05-05 ENCOUNTER — Other Ambulatory Visit: Payer: Self-pay

## 2022-05-05 MED ORDER — VALSARTAN 160 MG PO TABS
160.0000 mg | ORAL_TABLET | Freq: Every day | ORAL | 1 refills | Status: DC
  Filled 2022-05-05: qty 90, 90d supply, fill #0

## 2022-05-05 NOTE — Progress Notes (Signed)
05/05/2022 Name: Patricia RavelingSabre Denise Seiple MRN: [id number] DOB: Feb 21, 1950  Chief Complaint  Patient presents with   Medication Management   Diabetes   Hypertension   Hyperlipidemia    Patricia Donnal MoatDenise Conran is a 72 y.o. year old female who presented for a telephone visit.   They were referred to the pharmacist by a quality report for assistance in managing  chronic disease .Patient failed Controlling Blood Pressure, Medication Adherence in Diabetes, Medication Adherence in Hypertension, and Statin Use in Diabetes.    Subjective:  Care Team: Primary Care Provider: Excell SeltzerBedsole, Amy E, MD ; Next Scheduled Visit: 05/19/22  Medication Access/Adherence  Current Pharmacy:  [facility] [address] [place] [address] Phone: [phone] Fax: [phone]  Patient reports affordability concerns with their medications: Yes  Patient reports access/transportation concerns to their pharmacy: No  Patient reports adherence concerns with their medications:  No    Her brother passed way after Christmas from COVID, has been a difficult time for her. Notes she knows he would want her to take her medications and stay healthy, so she is trying to work on this.   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   Diabetes:  Current medications: prescribed Ozempic 0.5 mg weekly, from PAP - reports she hasn't been taking this consistently, maybe just a few doses since her last visit;  Medications tried in the past: Trulicity - had stomach upset; Mounjaro - did very well on this, but hit donut hole   Reports she has been inconsistently taking Ozempic 0.5 mg weekly, does report stomach upset, nausea.   Hypertension:  Current medications: losartan 50 mg daily  Patient has a validated, automated, upper arm home BP cuff Current blood pressure readings readings: reports readings still  ~180s/100s  Patient denies hypotensive s/sx including dizziness, lightheadedness.  Patient reports hypertensive symptoms including headache  Notes she has kept her losartan in the kitchen to see it, generally staying consistent with taking it.   Hyperlipidemia/ASCVD Risk Reduction  Current lipid lowering medications: none  Documentation of statin intolerance, but unclear which statin tried. Patient declined statin use in prior PCP conversations.   PREVENT Risk Score: 10 year risk of CVD: 19.6% - 10 year risk of ASCVD: 10.8% - 10 year risk of HF: 10.4%    Objective:  Lab Results  Component Value Date   HGBA1C 6.7 (A) 04/21/2022    Lab Results  Component Value Date   CREATININE 1.28 (H) 11/04/2021   BUN 27 (H) 11/04/2021   NA 144 11/04/2021   K 4.0 11/04/2021   CL 108 11/04/2021   CO2 30 11/04/2021    Lab Results  Component Value Date   CHOL 163 06/24/2021   HDL 46.60 06/24/2021   LDLCALC 96 06/24/2021   TRIG 99.0 06/24/2021   CHOLHDL 3 06/24/2021    Medications Reviewed Today     Reviewed by Alden HippHarper, Juwann Sherk T, RPH-CPP (Pharmacist) on 05/05/22 at [phone]  Med List Status: <None>   Medication Order Taking? Sig Documenting Provider Last Dose Status Informant  acetaminophen (TYLENOL) 500 MG tablet [id number]  Take 500 mg by mouth every 6 (six) hours as needed. [provider]  Active   Continuous Blood Gluc Receiver (FREESTYLE LIBRE 14 DAY READER) DEVI [id number]  1 EACH BY DOES NOT APPLY ROUTE 3 (THREE) TIMES DAILY AS NEEDED. Excell SeltzerBedsole, Amy E, MD  Active   Continuous Blood Gluc Sensor (FREESTYLE LIBRE 3 SENSOR) MISC [id number]  Place 1 sensor on the skin every 14 days. Use to check glucose continuously Bedsole, Amy E, MD  Active   glucose monitoring kit (FREESTYLE) monitoring kit [id number]  1 each by Does not apply route as needed for other. Excell Seltzer, MD  Active   linaclotide Karlene Einstein) 145 MCG CAPS capsule [id number] No Take 1 capsule (145 mcg total) by mouth  daily before breakfast.  Patient not taking: Reported on 04/21/2022   Excell Seltzer, MD Not Taking Active   losartan (COZAAR) 50 MG tablet [id number] Yes Take 1 tablet (50 mg total) by mouth daily. Excell Seltzer, MD Taking Active   omeprazole (PRILOSEC) 20 MG capsule [id number] No Take 1 capsule (20 mg total) by mouth daily.  Patient not taking: Reported on 05/05/2022   Excell Seltzer, MD Not Taking Active   Semaglutide,0.25 or 0.5MG /DOS, (OZEMPIC, 0.25 OR 0.5 MG/DOSE,) 2 MG/3ML SOPN [id number] Yes Inject 0.25 mg into the skin once a week. For 4 weeks then increase to 0.5 mg weekly. VIA NOVO CARES PAP [provider] Taking Active               Assessment/Plan:   Diabetes: - Currently controlled - Discussed that given inconsistency in dosing, may have improvement in GI side effects if she were to reduce back to Ozempic 0.25 mg weekly dose. Patient verbalizes agreement in decreasing to this.    Hypertension: - Currently uncontrolled - Reviewed long term cardiovascular and renal outcomes of uncontrolled blood pressure - Reviewed appropriate blood pressure monitoring technique and reviewed goal blood pressure. Recommended to check home blood pressure and heart rate daily - Given inconsistent daily schedule, patient may have greater benefit from an ARB with a longer half life, such as valsartan. Discussed with PCP, she is in agreement. Stop losartan, start valsartan 160 mg daily. Discussed with patient, she verbalizes understanding.    Hyperlipidemia/ASCVD Risk Reduction: - Currently untreated.  - Will discuss benefit vs risk moving forward. Focus today on hypertension.    Follow Up Plan: PCP visit in 2 weeks  Catie Eppie Gibson, PharmD, BCACP, CPP [facility] [phone]

## 2022-05-05 NOTE — Patient Instructions (Signed)
Ellisyn,   It was great talking to you today!  Stop losartan. Start valsartan 160 mg daily.   Try reducing the Ozempic to 0.25 mg weekly every Sunday and see if you feel better with that.   Check your blood pressure once daily, and any time you have concerning symptoms like headache, chest pain, dizziness, shortness of breath, or vision changes.   Our goal is less than 140/90.  To appropriately check your blood pressure, make sure you do the following:  1) Avoid caffeine, exercise, or tobacco products for 30 minutes before checking. Empty your bladder. 2) Sit with your back supported in a flat-backed chair. Rest your arm on something flat (arm of the chair, table, etc). 3) Sit still with your feet flat on the floor, resting, for at least 5 minutes.  4) Check your blood pressure. Take 1-2 readings.  5) Write down these readings and bring with you to any provider appointments.  Bring your home blood pressure machine with you to a provider's office for accuracy comparison at least once a year.   Make sure you take your blood pressure medications before you come to any office visit, even if you were asked to fast for labs.   Take care!  Catie Eppie Gibson, PharmD, BCACP, CPP [facility] [phone]

## 2022-05-09 ENCOUNTER — Telehealth: Payer: Self-pay

## 2022-05-09 NOTE — Progress Notes (Signed)
Care Management & Coordination Services Pharmacy Team  Reason for Encounter: Medication reaction    Spoke with patient on 05/09/2022   Received a call from the patient stating that she has not felt well for the past few days. Her BP medication was changed recently  to valsartan and she recently started back on ozempic. We discussed side effects and that nausea should subside in a few days for tolerance. Will follow up with patient end of week to check on condition.  Al Corpus, PharmD notified  Burt Knack, [facility] Clinical Pharmacy Assistant [phone]

## 2022-05-17 ENCOUNTER — Ambulatory Visit (INDEPENDENT_AMBULATORY_CARE_PROVIDER_SITE_OTHER): Payer: PPO

## 2022-05-17 VITALS — BP 184/96 | Ht 61.0 in | Wt 173.0 lb

## 2022-05-17 DIAGNOSIS — Z0000 Encounter for general adult medical examination without abnormal findings: Secondary | ICD-10-CM

## 2022-05-17 NOTE — Patient Instructions (Signed)
Patricia Horne , Thank you for taking time to come for your Medicare Wellness Visit. I appreciate your ongoing commitment to your health goals. Please review the following plan we discussed and let me know if I can assist you in the future.   These are the goals we discussed:  Goals      Patient Stated     Starting 06/12/2018, I will continue to take medications as prescribed.      Patient Stated     Would like to start taking medications and go to the gym      Patient Stated     Decrease blood pressure, lose weigh     Pharmacy Care Plan     CARE PLAN ENTRY (see longitudinal plan of care for additional care plan information)  Current Barriers:  Chronic Disease Management support, education, and care coordination needs related to Hypertension and Diabetes   Hypertension BP Readings from Last 3 Encounters:  01/27/20 (!) 144/104  12/19/19 (!) 170/90  11/21/19 (!) 166/102  Pharmacist Clinical Goal(s): Over the next 30 days, patient will work with PharmD and providers to achieve BP goal <140/90 Current regimen:  Losartan 25 mg - 1 tablet twice daily Interventions: Encouraged blood pressure control with exercise and low sodium diet Encouraged taking medication as prescribed. Call if any dizziness with losartan twice daily Encouraged checking home BP 3 days weekly for the next 2 weeks  Patient self care activities - Over the next 30 days, patient will: Check BP 3 days per week, document, and provide at future appointments Take losartan twice daily and avoid missed doses Set alarm on phone to assist with losartan adherence Try Ms. Dash no salt seasonings to replace salt on food  Diabetes Lab Results  Component Value Date/Time   HGBA1C 6.5 09/19/2019 09:10 AM   HGBA1C 6.7 (H) 06/09/2019 09:01 AM   HGBA1C 13.9 06/05/2017 12:00 AM  Pharmacist Clinical Goal(s): Over the next 30 days, patient will work with PharmD and providers to maintain A1c goal <7% Current regimen:  Trulicity 0.75  mg - Inject once weekly on Sundays  Freestyle Libre Interventions: Reviewed use of Imodium and discussed safety of use as needed up to 8 mg daily. Patient taking about 2 times weekly. Discussed missed doses of Trulicity. Take Trulicity today and resume Sunday dosing.  Reviewed home BG monitoring. Patient using Freestyle. It fell off early this month but usually works well. Average fasting 90-115. Average after meals 175-180. Patient self care activities - Over the next 30 days, patient will: Continue to use Freestyle Libre to ensure BG remains within goal (fasting < 130 and 2 hours after meals < 180)  Call if diarrhea worsens or becomes intolerable  Contact provider with any episodes of hypoglycemia  Initial goal documentation        This is a list of the screening recommended for you and due dates:  Health Maintenance  Topic Date Due   Zoster (Shingles) Vaccine (1 of 2) Never done   DTaP/Tdap/Td vaccine (3 - Tdap) 03/03/2019   Colon Cancer Screening  01/01/2021   Eye exam for diabetics  03/24/2021   COVID-19 Vaccine (5 - 2023-24 season) 09/30/2021   Complete foot exam   08/13/2022   Flu Shot  08/31/2022   Hemoglobin A1C  10/22/2022   Yearly kidney function blood test for diabetes  11/05/2022   Yearly kidney health urinalysis for diabetes  12/10/2022   Medicare Annual Wellness Visit  05/17/2023   Mammogram  07/22/2023  Pneumonia Vaccine  Completed   DEXA scan (bone density measurement)  Completed   Hepatitis C Screening: USPSTF Recommendation to screen - Ages 74-79 yo.  Completed   HPV Vaccine  Aged Out    Advanced directives: Discussed, patient is an employee within [facility] [employer] system, able to obtain form.  Conditions/risks identified: Keep up the good work  Next appointment: Follow up in one year for your annual wellness visit 05/21/2023   Preventive Care 65 Years and Older, Female Preventive care refers to lifestyle choices and visits with your health care provider  that can promote health and wellness. What does preventive care include? A yearly physical exam. This is also called an annual well check. Dental exams once or twice a year. Routine eye exams. Ask your health care provider how often you should have your eyes checked. Personal lifestyle choices, including: Daily care of your teeth and gums. Regular physical activity. Eating a healthy diet. Avoiding tobacco and drug use. Limiting alcohol use. Practicing safe sex. Taking low-dose aspirin every day. Taking vitamin and mineral supplements as recommended by your health care provider. What happens during an annual well check? The services and screenings done by your health care provider during your annual well check will depend on your age, overall health, lifestyle risk factors, and family history of disease. Counseling  Your health care provider may ask you questions about your: Alcohol use. Tobacco use. Drug use. Emotional well-being. Home and relationship well-being. Sexual activity. Eating habits. History of falls. Memory and ability to understand (cognition). Work and work Astronomer. Reproductive health. Screening  You may have the following tests or measurements: Height, weight, and BMI. Blood pressure. Lipid and cholesterol levels. These may be checked every 5 years, or more frequently if you are over 90 years old. Skin check. Lung cancer screening. You may have this screening every year starting at age 53 if you have a 30-pack-year history of smoking and currently smoke or have quit within the past 15 years. Fecal occult blood test (FOBT) of the stool. You may have this test every year starting at age 78. Flexible sigmoidoscopy or colonoscopy. You may have a sigmoidoscopy every 5 years or a colonoscopy every 10 years starting at age 38. Hepatitis C blood test. Hepatitis B blood test. Sexually transmitted disease (STD) testing. Diabetes screening. This is done by checking  your blood sugar (glucose) after you have not eaten for a while (fasting). You may have this done every 1-3 years. Bone density scan. This is done to screen for osteoporosis. You may have this done starting at age 43. Mammogram. This may be done every 1-2 years. Talk to your health care provider about how often you should have regular mammograms. Talk with your health care provider about your test results, treatment options, and if necessary, the need for more tests. Vaccines  Your health care provider may recommend certain vaccines, such as: Influenza vaccine. This is recommended every year. Tetanus, diphtheria, and acellular pertussis (Tdap, Td) vaccine. You may need a Td booster every 10 years. Zoster vaccine. You may need this after age 60. Pneumococcal 13-valent conjugate (PCV13) vaccine. One dose is recommended after age 69. Pneumococcal polysaccharide (PPSV23) vaccine. One dose is recommended after age 7. Talk to your health care provider about which screenings and vaccines you need and how often you need them. This information is not intended to replace advice given to you by your health care provider. Make sure you discuss any questions you have with your health  care provider. Document Released: 02/12/2015 Document Revised: 10/06/2015 Document Reviewed: 11/17/2014 Elsevier Interactive Patient Education  2017 [employer].  Fall Prevention in the Home Falls can cause injuries. They can happen to people of all ages. There are many things you can do to make your home safe and to help prevent falls. What can I do on the outside of my home? Regularly fix the edges of walkways and driveways and fix any cracks. Remove anything that might make you trip as you walk through a door, such as a raised step or threshold. Trim any bushes or trees on the path to your home. Use bright outdoor lighting. Clear any walking paths of anything that might make someone trip, such as rocks or  tools. Regularly check to see if handrails are loose or broken. Make sure that both sides of any steps have handrails. Any raised decks and porches should have guardrails on the edges. Have any leaves, snow, or ice cleared regularly. Use sand or salt on walking paths during winter. Clean up any spills in your garage right away. This includes oil or grease spills. What can I do in the bathroom? Use night lights. Install grab bars by the toilet and in the tub and shower. Do not use towel bars as grab bars. Use non-skid mats or decals in the tub or shower. If you need to sit down in the shower, use a plastic, non-slip stool. Keep the floor dry. Clean up any water that spills on the floor as soon as it happens. Remove soap buildup in the tub or shower regularly. Attach bath mats securely with double-sided non-slip rug tape. Do not have throw rugs and other things on the floor that can make you trip. What can I do in the bedroom? Use night lights. Make sure that you have a light by your bed that is easy to reach. Do not use any sheets or blankets that are too big for your bed. They should not hang down onto the floor. Have a firm chair that has side arms. You can use this for support while you get dressed. Do not have throw rugs and other things on the floor that can make you trip. What can I do in the kitchen? Clean up any spills right away. Avoid walking on wet floors. Keep items that you use a lot in easy-to-reach places. If you need to reach something above you, use a strong step stool that has a grab bar. Keep electrical cords out of the way. Do not use floor polish or wax that makes floors slippery. If you must use wax, use non-skid floor wax. Do not have throw rugs and other things on the floor that can make you trip. What can I do with my stairs? Do not leave any items on the stairs. Make sure that there are handrails on both sides of the stairs and use them. Fix handrails that are  broken or loose. Make sure that handrails are as long as the stairways. Check any carpeting to make sure that it is firmly attached to the stairs. Fix any carpet that is loose or worn. Avoid having throw rugs at the top or bottom of the stairs. If you do have throw rugs, attach them to the floor with carpet tape. Make sure that you have a light switch at the top of the stairs and the bottom of the stairs. If you do not have them, ask someone to add them for you. What else can I  do to help prevent falls? Wear shoes that: Do not have high heels. Have rubber bottoms. Are comfortable and fit you well. Are closed at the toe. Do not wear sandals. If you use a stepladder: Make sure that it is fully opened. Do not climb a closed stepladder. Make sure that both sides of the stepladder are locked into place. Ask someone to hold it for you, if possible. Clearly mark and make sure that you can see: Any grab bars or handrails. First and last steps. Where the edge of each step is. Use tools that help you move around (mobility aids) if they are needed. These include: Canes. Walkers. Scooters. Crutches. Turn on the lights when you go into a dark area. Replace any light bulbs as soon as they burn out. Set up your furniture so you have a clear path. Avoid moving your furniture around. If any of your floors are uneven, fix them. If there are any pets around you, be aware of where they are. Review your medicines with your doctor. Some medicines can make you feel dizzy. This can increase your chance of falling. Ask your doctor what other things that you can do to help prevent falls. This information is not intended to replace advice given to you by your health care provider. Make sure you discuss any questions you have with your health care provider. Document Released: 11/12/2008 Document Revised: 06/24/2015 Document Reviewed: 02/20/2014 Elsevier Interactive Patient Education  2017 [employer].

## 2022-05-17 NOTE — Progress Notes (Signed)
Subjective:   Patricia Horne is a 72 y.o. female who presents for Medicare Annual (Subsequent) preventive examination.  I connected with  Ines Rebel on 05/17/22 by a audio enabled telemedicine application and verified that I am speaking with the correct person using two identifiers.  Patient Location: Home  Provider Location: Home Office  I discussed the limitations of evaluation and management by telemedicine. The patient expressed understanding and agreed to proceed.       Review of Systems     Cardiac Risk Factors include: advanced age (>14men, >35 women);diabetes mellitus;hypertension     Objective:    Today's Vitals   05/17/22 0843  BP: (!) 184/96  Weight: 173 lb (78.5 kg)  Height: 5\' 1"  (1.549 m)   Body mass index is 32.69 kg/m.     05/17/2022    8:59 AM 07/13/2021    3:14 PM 03/30/2021    2:52 PM 12/03/2020    4:40 PM 11/29/2020    4:17 PM 09/29/2019    6:55 AM 05/20/2019    1:22 PM  Advanced Directives  Does Patient Have a Medical Advance Directive? No No No No No No No  Would patient like information on creating a medical advance directive? No - Patient declined No - Patient declined Yes (MAU/Ambulatory/Procedural Areas - Information given) No - Patient declined No - Patient declined No - Patient declined     Current Medications (verified) Outpatient Encounter Medications as of 05/17/2022  Medication Sig   acetaminophen (TYLENOL) 500 MG tablet Take 500 mg by mouth every 6 (six) hours as needed.   Cholecalciferol (VITAMIN D) 50 MCG (2000 UT) CAPS Take by mouth.   Continuous Blood Gluc Receiver (FREESTYLE LIBRE 14 DAY READER) DEVI 1 EACH BY DOES NOT APPLY ROUTE 3 (THREE) TIMES DAILY AS NEEDED.   Continuous Blood Gluc Sensor (FREESTYLE LIBRE 3 SENSOR) MISC Place 1 sensor on the skin every 14 days. Use to check glucose continuously   glucose monitoring kit (FREESTYLE) monitoring kit 1 each by Does not apply route as needed for other.   omeprazole  (PRILOSEC) 20 MG capsule Take 1 capsule (20 mg total) by mouth daily.   Semaglutide,0.25 or 0.5MG /DOS, (OZEMPIC, 0.25 OR 0.5 MG/DOSE,) 2 MG/3ML SOPN Inject 0.25 mg into the skin once a week. For 4 weeks then increase to 0.5 mg weekly. VIA NOVO CARES PAP   valsartan (DIOVAN) 160 MG tablet Take 1 tablet (160 mg total) by mouth daily.   linaclotide (LINZESS) 145 MCG CAPS capsule Take 1 capsule (145 mcg total) by mouth daily before breakfast. (Patient not taking: Reported on 05/17/2022)   No facility-administered encounter medications on file as of 05/17/2022.    Allergies (verified) Amoxicillin   History: Past Medical History:  Diagnosis Date   Abdominal pain, left lower quadrant    Acute upper respiratory infections of unspecified site    Allergic genetic state    Arthritis    in arms   Glaucoma    Lipoma of unspecified site    Neuropathy    Obesity    Other and unspecified noninfectious gastroenteritis and colitis(558.9)    Other screening mammogram    Routine general medical examination at a health care facility    Screening for lipoid disorders    Special screening for osteoporosis    Tibialis tendinitis    Tubular adenoma of colon 03/31/2014   Type II or unspecified type diabetes mellitus without mention of complication, not stated as uncontrolled    Undiagnosed  cardiac murmurs    Unspecified essential hypertension    Past Surgical History:  Procedure Laterality Date   CATARACT EXTRACTION W/PHACO Left 09/29/2019   Procedure: CATARACT EXTRACTION PHACO AND INTRAOCULAR LENS PLACEMENT (IOC) LEFT DIABETIC 3.42 00:32.9;  Surgeon: Nevada Crane, MD;  Location: [facility];  Service: Ophthalmology;  Laterality: Left;   COLONOSCOPY  04/01/2004   COLONOSCOPY  03/31/2014   COLONOSCOPY WITH PROPOFOL N/A 01/01/2018   Procedure: COLONOSCOPY WITH PROPOFOL;  Surgeon: Christena Deem, MD;  Location: [facility];  Service: Endoscopy;  Laterality: N/A;   LIPOMA EXCISION Left  2010   arm   TOE SURGERY  1990's   bunion, hammer toe repair   TOTAL ABDOMINAL HYSTERECTOMY  1983   one ovary removed, later other ovary removed   Family History  Problem Relation Age of Onset   Diabetes Mother    Arthritis Father    Diabetes Brother        type 1   Ulcerative colitis Paternal Grandmother    Colon polyps Paternal Grandfather    Breast cancer Neg Hx    Social History   Socioeconomic History   Marital status: Single    Spouse name: Not on file   Number of children: 0   Years of education: Not on file   Highest education level: Not on file  Occupational History   Occupation: Geologist, engineering, Passenger transport manager at [employer]  Tobacco Use   Smoking status: Never   Smokeless tobacco: Never  Vaping Use   Vaping Use: Never used  Substance and Sexual Activity   Alcohol use: No    Alcohol/week: 0.0 standard drinks of alcohol   Drug use: No   Sexual activity: Not Currently  Other Topics Concern   Not on file  Social History Narrative   Caffeine use: soda/Pepsi   Fast food.  Regular exercise at work.   No sex.   Social Determinants of Health   Financial Resource Strain: Low Risk  (03/30/2021)   Overall Financial Resource Strain (CARDIA)    Difficulty of Paying Living Expenses: Not hard at all  Food Insecurity: No Food Insecurity (05/17/2022)   Hunger Vital Sign    Worried About Running Out of Food in the Last Year: Never true    Ran Out of Food in the Last Year: Never true  Transportation Needs: No Transportation Needs (03/30/2021)   PRAPARE - Administrator, Civil Service (Medical): No    Lack of Transportation (Non-Medical): No  Physical Activity: Inactive (03/30/2021)   Exercise Vital Sign    Days of Exercise per Week: 0 days    Minutes of Exercise per Session: 0 min  Stress: No Stress Concern Present (03/30/2021)   [employer] of Occupational Health - Occupational Stress Questionnaire    Feeling of Stress : Not at all  Social Connections: Moderately  Isolated (03/30/2021)   Social Connection and Isolation Panel [NHANES]    Frequency of Communication with Friends and Family: More than three times a week    Frequency of Social Gatherings with Friends and Family: Three times a week    Attends Religious Services: 1 to 4 times per year    Active Member of Clubs or Organizations: No    Attends Banker Meetings: Never    Marital Status: Never married    Tobacco Counseling Counseling given: Not Answered   Clinical Intake:  Pre-visit preparation completed: No  Pain : No/denies pain     BMI - recorded: 32.69  Nutritional Risks: None Diabetes: No  How often do you need to have someone help you when you read instructions, pamphlets, or other written materials from your doctor or pharmacy?: 1 - Never  Diabetic? Yes  Interpreter Needed?: No  Information entered by :: Kandis Cocking, CMA   Activities of Daily Living    05/17/2022    9:00 AM  In your present state of health, do you have any difficulty performing the following activities:  Hearing? 0  Vision? 0  Difficulty concentrating or making decisions? 0  Walking or climbing stairs? 0  Dressing or bathing? 0  Doing errands, shopping? 0  Preparing Food and eating ? N  Using the Toilet? N  In the past six months, have you accidently leaked urine? N  Do you have problems with loss of bowel control? N  Managing your Medications? N  Managing your Finances? N  Housekeeping or managing your Housekeeping? N    Patient Care Team: Excell Seltzer, MD as PCP - General Debbe Odea, MD as PCP - Cardiology (Cardiology) Alden Hipp, RPH-CPP (Pharmacist)  Indicate any recent Medical Services you may have received from other than [facility] providers in the past year (date may be approximate).     Assessment:   This is a routine wellness examination for Sophiana.  Hearing/Vision screen Hearing Screening - Comments:: Denies hearing difficulties   Vision  Screening - Comments:: Wears  reading glasses - up to date with routine eye exams with  03/31/2022 injection in eyes for diabetic at [facility]  Dietary issues and exercise activities discussed: Current Exercise Habits: The patient does not participate in regular exercise at present   Goals Addressed             This Visit's Progress    Patient Stated       Decrease blood pressure, lose weigh       Depression Screen    05/17/2022    9:01 AM 04/21/2022   10:31 AM 03/30/2021    2:57 PM 11/29/2020    4:17 PM 09/23/2019   11:29 AM 06/17/2019   10:08 AM 06/12/2018   12:10 PM  PHQ 2/9 Scores  PHQ - 2 Score 1 2 0 0 0 0 0  PHQ- 9 Score 1 10     0    Fall Risk    05/17/2022    8:55 AM 04/21/2022   10:31 AM 03/30/2021    2:55 PM 02/28/2021   11:06 AM 11/29/2020    4:16 PM  Fall Risk   Falls in the past year? 0 1 1 0 0  Number falls in past yr: 0 0 0    Injury with Fall? 0 0 0    Risk for fall due to : No Fall Risks History of fall(s) Other (Comment)    Risk for fall due to: Comment   tripped    Follow up Falls prevention discussed Falls evaluation completed Falls prevention discussed      FALL RISK PREVENTION PERTAINING TO THE HOME:  Any stairs in or around the home? Yes  If so, are there any without handrails? No  Home free of loose throw rugs in walkways, pet beds, electrical cords, etc? No  Adequate lighting in your home to reduce risk of falls? Yes   ASSISTIVE DEVICES UTILIZED TO PREVENT FALLS:  Life alert? No  Use of a cane, walker or w/c? No  Grab bars in the bathroom? Yes Shower chair or bench in shower? Yes  Elevated toilet seat or a handicapped toilet? Yes   TIMED UP AND GO:  Was the test performed? No . televisit   Cognitive Function:    06/12/2018    5:49 PM  MMSE - Mini Mental State Exam  Orientation to time 5  Orientation to Place 5  Registration 3  Attention/ Calculation 0  Recall 3  Language- name 2 objects 0  Language- repeat 1   Language- follow 3 step command 0  Language- read & follow direction 0  Write a sentence 0  Copy design 0  Total score 17        05/17/2022    9:01 AM  6CIT Screen  What Year? 0 points  What month? 0 points  What time? 0 points  Count back from 20 0 points  Months in reverse 0 points  Repeat phrase 0 points  Total Score 0 points    Immunizations Immunization History  Administered Date(s) Administered   Fluad Quad(high Dose 65+) 10/01/2018, 10/14/2021   Influenza Split 09/14/2010   Influenza, High Dose Seasonal PF 10/30/2016, 09/13/2017   Influenza,inj,Quad PF,6+ Mos 10/09/2012, 10/30/2014, 10/29/2015   Influenza-Unspecified 10/30/2013, 10/20/2019   PFIZER(Purple Top)SARS-COV-2 Vaccination 01/21/2019, 02/11/2019, 11/14/2019   Pfizer Covid-19 Vaccine Bivalent Booster 79yrs & up 11/15/2020   Pneumococcal Conjugate-13 05/09/2016   Pneumococcal Polysaccharide-23 10/30/2014, 03/26/2020   Td 01/31/2000, 03/02/2009    TDAP status: Due, Education has been provided regarding the importance of this vaccine. Advised may receive this vaccine at local pharmacy or Health Dept. Aware to provide a copy of the vaccination record if obtained from local pharmacy or Health Dept. Verbalized acceptance and understanding.  Flu Vaccine status: Up to date  Pneumococcal vaccine status: Up to date  Covid-19 vaccine status: Information provided on how to obtain vaccines.   Qualifies for Shingles Vaccine? Yes   Zostavax completed No   Shingrix Completed?: No.    Education has been provided regarding the importance of this vaccine. Patient has been advised to call insurance company to determine out of pocket expense if they have not yet received this vaccine. Advised may also receive vaccine at local pharmacy or Health Dept. Verbalized acceptance and understanding.  Screening Tests Health Maintenance  Topic Date Due   Zoster Vaccines- Shingrix (1 of 2) Never done   DTaP/Tdap/Td (3 - Tdap)  03/03/2019   COLONOSCOPY (Pts 45-38yrs Insurance coverage will need to be confirmed)  01/01/2021   OPHTHALMOLOGY EXAM  03/24/2021   COVID-19 Vaccine (5 - 2023-24 season) 09/30/2021   FOOT EXAM  08/13/2022   INFLUENZA VACCINE  08/31/2022   HEMOGLOBIN A1C  10/22/2022   Diabetic kidney evaluation - eGFR measurement  11/05/2022   Diabetic kidney evaluation - Urine ACR  12/10/2022   Medicare Annual Wellness (AWV)  05/17/2023   MAMMOGRAM  07/22/2023   Pneumonia Vaccine 25+ Years old  Completed   DEXA SCAN  Completed   Hepatitis C Screening  Completed   HPV VACCINES  Aged Out    Health Maintenance  Health Maintenance Due  Topic Date Due   Zoster Vaccines- Shingrix (1 of 2) Never done   DTaP/Tdap/Td (3 - Tdap) 03/03/2019   COLONOSCOPY (Pts 45-22yrs Insurance coverage will need to be confirmed)  01/01/2021   OPHTHALMOLOGY EXAM  03/24/2021   COVID-19 Vaccine (5 - 2023-24 season) 09/30/2021    Colorectal cancer screening: Type of screening: Colonoscopy. Completed 01/01/2018. Repeat every 5 years  Mammogram status: Completed 07/21/2021. Repeat every year  Bone Density status: Completed 07/21/2021. Results  reflect: Bone density results: NORMAL. Repeat every 2 years.  Lung Cancer Screening: (Low Dose CT Chest recommended if Age 8-80 years, 30 pack-year currently smoking OR have quit w/in 15years.) does not qualify.   Lung Cancer Screening Referral: N/A  Additional Screening:  Hepatitis C Screening: does qualify; Completed 03/30/2015  Vision Screening: Recommended annual ophthalmology exams for early detection of glaucoma and other disorders of the eye. Is the patient up to date with their annual eye exam?  Yes   Who is the provider or what is the name of the office in which the patient attends annual eye exams? [facility] If pt is not established with a provider, would they like to be referred to a provider to establish care? No .   Dental Screening: Recommended annual  dental exams for proper oral hygiene  Community Resource Referral / Chronic Care Management: CRR required this visit?  No   CCM required this visit?  No      Plan:     I have personally reviewed and noted the following in the patient's chart:   Medical and social history Use of alcohol, tobacco or illicit drugs  Current medications and supplements including opioid prescriptions. Patient is not currently taking opioid prescriptions. Functional ability and status Nutritional status Physical activity Advanced directives List of other physicians Hospitalizations, surgeries, and ER visits in previous 12 months Vitals Screenings to include cognitive, depression, and falls Referrals and appointments  In addition, I have reviewed and discussed with patient certain preventive protocols, quality metrics, and best practice recommendations. A written personalized care plan for preventive services as well as general preventive health recommendations were provided to patient.     Milus Mallick, CMA   05/17/2022   Nurse Notes: Discussed updated vaccines, and testing.

## 2022-05-19 ENCOUNTER — Other Ambulatory Visit: Payer: Self-pay

## 2022-05-19 ENCOUNTER — Ambulatory Visit (INDEPENDENT_AMBULATORY_CARE_PROVIDER_SITE_OTHER): Payer: PPO | Admitting: Family Medicine

## 2022-05-19 ENCOUNTER — Encounter: Payer: Self-pay | Admitting: Family Medicine

## 2022-05-19 VITALS — BP 180/86 | HR 86 | Temp 97.9°F | Ht 61.0 in | Wt 171.2 lb

## 2022-05-19 DIAGNOSIS — E1149 Type 2 diabetes mellitus with other diabetic neurological complication: Secondary | ICD-10-CM

## 2022-05-19 DIAGNOSIS — E1159 Type 2 diabetes mellitus with other circulatory complications: Secondary | ICD-10-CM

## 2022-05-19 DIAGNOSIS — I152 Hypertension secondary to endocrine disorders: Secondary | ICD-10-CM

## 2022-05-19 LAB — BASIC METABOLIC PANEL
BUN: 26 mg/dL — ABNORMAL HIGH (ref 6–23)
CO2: 27 mEq/L (ref 19–32)
Calcium: 9.2 mg/dL (ref 8.4–10.5)
Chloride: 104 mEq/L (ref 96–112)
Creatinine, Ser: 1.04 mg/dL (ref 0.40–1.20)
GFR: 53.99 mL/min — ABNORMAL LOW (ref >60.00)
Glucose, Bld: 97 mg/dL (ref 70–99)
Potassium: 4.7 mEq/L (ref 3.5–5.1)
Sodium: 138 mEq/L (ref 135–145)

## 2022-05-19 MED ORDER — VALSARTAN 320 MG PO TABS
320.0000 mg | ORAL_TABLET | Freq: Every day | ORAL | 11 refills | Status: DC
  Filled 2022-05-19: qty 30, 30d supply, fill #0

## 2022-05-19 NOTE — Patient Instructions (Signed)
Set up yearly eye exam for diabetes and have the opthalmologist send [place] a copy of the evaluation for the chart.  FYI.. call if interested in stoney  eye eval... next on 4/24.

## 2022-05-19 NOTE — Progress Notes (Signed)
Patient ID: Patricia Horne, female    DOB: 14-Nov-1950, 72 y.o.   MRN: [id number]  This visit was conducted in person.  BP (!) 180/90   Pulse 86   Temp 97.9 F (36.6 C) (Temporal)   Ht  (1.549 m)   Wt 171 lb 4 oz (77.7 kg)   SpO2 99%   BMI 32.36 kg/m    CC:  Chief Complaint  Patient presents with   Diabetes   Hypertension    Subjective:   HPI: Patricia Horne is a 72 y.o. female presenting on 05/19/2022 for Diabetes and Hypertension Reviewed last pharmacy medication management visit May 04, 2022  Changed to valsartan given longer half-life compared to losartan.  Hypertension:  Poor control in office today patient prescribed valsartan 160 mg p.o. daily ( has bene taking in last week)... In the past she had been on amlodipine 10 mg p.o. daily but is not currently taking. BP Readings from Last 3 Encounters:  05/19/22 (!) 180/90  05/17/22 (!) 184/96  04/21/22 (!) 182/88  Using medication without problems or lightheadedness: none Chest pain with exertion:none Edema:none Short of breath:none Average home BPs: not checking. Other issues:  Diabetes: Recent change from [facility] to Ozempic, on 0.25 mg weekly.. NO SE. She reports Lab Results  Component Value Date   HGBA1C 6.7 (A) 04/21/2022  Using medications without difficulties: Hypoglycemic episodes: none Hyperglycemic episodes: none Feet problems: no ulcers Blood Sugars averaging: not checking... getting new sensors for CGM. eye exam within last year:       Relevant past medical, surgical, family and social history reviewed and updated as indicated. Interim medical history since our last visit reviewed. Allergies and medications reviewed and updated. Outpatient Medications Prior to Visit  Medication Sig Dispense Refill   acetaminophen (TYLENOL) 500 MG tablet Take 500 mg by mouth every 6 (six) hours as needed.     Cholecalciferol (VITAMIN D) 50 MCG (2000 UT) CAPS Take by mouth.     Continuous Blood Gluc  Receiver (FREESTYLE LIBRE 14 DAY READER) DEVI 1 EACH BY DOES NOT APPLY ROUTE 3 (THREE) TIMES DAILY AS NEEDED. 1 each 0   Continuous Blood Gluc Sensor (FREESTYLE LIBRE 3 SENSOR) MISC Place 1 sensor on the skin every 14 days. Use to check glucose continuously 2 each 11   glucose monitoring kit (FREESTYLE) monitoring kit 1 each by Does not apply route as needed for other. 1 each 0   omeprazole (PRILOSEC) 20 MG capsule Take 1 capsule (20 mg total) by mouth daily. 30 capsule 3   Semaglutide,0.25 or 0.5MG /DOS, (OZEMPIC, 0.25 OR 0.5 MG/DOSE,) 2 MG/3ML SOPN Inject 0.25 mg into the skin once a week. For 4 weeks then increase to 0.5 mg weekly. VIA NOVO CARES PAP     valsartan (DIOVAN) 160 MG tablet Take 1 tablet (160 mg total) by mouth daily. 90 tablet 1   linaclotide (LINZESS) 145 MCG CAPS capsule Take 1 capsule (145 mcg total) by mouth daily before breakfast. (Patient not taking: Reported on 05/17/2022) 30 capsule 11   No facility-administered medications prior to visit.     Per HPI unless specifically indicated in ROS section below Review of Systems  Constitutional:  Negative for fatigue and fever.  HENT:  Negative for congestion.   Eyes:  Negative for pain.  Respiratory:  Negative for cough and shortness of breath.   Cardiovascular:  Negative for chest pain, palpitations and leg swelling.  Gastrointestinal:  Negative for abdominal pain.  Genitourinary:  Negative for dysuria and vaginal bleeding.  Musculoskeletal:  Negative for back pain.  Neurological:  Negative for syncope, light-headedness and headaches.  Psychiatric/Behavioral:  Negative for dysphoric mood.    Objective:  BP (!) 180/90   Pulse 86   Temp 97.9 F (36.6 C) (Temporal)   Ht  (1.549 m)   Wt 171 lb 4 oz (77.7 kg)   SpO2 99%   BMI 32.36 kg/m   Wt Readings from Last 3 Encounters:  05/19/22 171 lb 4 oz (77.7 kg)  05/17/22 173 lb (78.5 kg)  04/21/22 174 lb (78.9 kg)      Physical Exam Constitutional:      General: She  is not in acute distress.    Appearance: Normal appearance. She is well-developed. She is not ill-appearing or toxic-appearing.  HENT:     Head: Normocephalic.     Right Ear: Hearing, tympanic membrane, ear canal and external ear normal. Tympanic membrane is not erythematous, retracted or bulging.     Left Ear: Hearing, tympanic membrane, ear canal and external ear normal. Tympanic membrane is not erythematous, retracted or bulging.     Nose: No mucosal edema or rhinorrhea.     Right Sinus: No maxillary sinus tenderness or frontal sinus tenderness.     Left Sinus: No maxillary sinus tenderness or frontal sinus tenderness.     Mouth/Throat:     Pharynx: Uvula midline.  Eyes:     General: Lids are normal. Lids are everted, no foreign bodies appreciated.     Conjunctiva/sclera: Conjunctivae normal.     Pupils: Pupils are equal, round, and reactive to light.  Neck:     Thyroid: No thyroid mass or thyromegaly.     Vascular: No carotid bruit.     Trachea: Trachea normal.  Cardiovascular:     Rate and Rhythm: Normal rate and regular rhythm.     Pulses: Normal pulses.     Heart sounds: Normal heart sounds, S1 normal and S2 normal. No murmur heard.    No friction rub. No gallop.  Pulmonary:     Effort: Pulmonary effort is normal. No tachypnea or respiratory distress.     Breath sounds: Normal breath sounds. No decreased breath sounds, wheezing, rhonchi or rales.  Abdominal:     General: Bowel sounds are normal.     Palpations: Abdomen is soft.     Tenderness: There is no abdominal tenderness.  Musculoskeletal:     Cervical back: Normal range of motion and neck supple.  Skin:    General: Skin is warm and dry.     Findings: No rash.  Neurological:     Mental Status: She is alert.  Psychiatric:        Mood and Affect: Mood is not anxious or depressed.        Speech: Speech normal.        Behavior: Behavior normal. Behavior is cooperative.        Thought Content: Thought content normal.         Judgment: Judgment normal.       Results for orders placed or performed in visit on 04/21/22  POCT glycosylated hemoglobin (Hb A1C)  Result Value Ref Range   Hemoglobin A1C 6.7 (A) 4.0 - 5.6 %   HbA1c POC (<> result, manual entry)     HbA1c, POC (prediabetic range)     HbA1c, POC (controlled diabetic range)      Assessment and Plan  Type 2 diabetes mellitus with neurological complications  Assessment & Plan: Chronic, excellent control in past Mounjaro when she is compliant with medication..  Now changed to Ozempic for insurance coverage currently on 0.25 mg dosing. She is having issue with sensors with continuous glucose monitor and we will try to get her in touch with care coordination/pharmacy for assistance.  Will check fasting sugar with bmp today but likely plan increasing Ozempic to 0.5 mg weekly.       Hypertension associated with diabetes Assessment & Plan: Chronic, continued poor control despite change from losartan to valsartan 160 mg p.o. daily. Will check BMP today to evaluate electrolytes and kidney function, as long as things look stable we will plan to increase valsartan to 320 mg p.o. daily.  Orders: -     Basic metabolic panel  Other orders -     Valsartan; Take 1 tablet (320 mg total) by mouth daily.  Dispense: 30 tablet; Refill: 11    No follow-ups on file.   Kerby Nora, MD

## 2022-05-19 NOTE — Assessment & Plan Note (Addendum)
Chronic, excellent control in past Mounjaro when she is compliant with medication..  Now changed to Ozempic for insurance coverage currently on 0.25 mg dosing. She is having issue with sensors with continuous glucose monitor and we will try to get her in touch with care coordination/pharmacy for assistance.  Will check fasting sugar with bmp today but likely plan increasing Ozempic to 0.5 mg weekly.

## 2022-05-19 NOTE — Assessment & Plan Note (Signed)
Chronic, continued poor control despite change from losartan to valsartan 160 mg p.o. daily. Will check BMP today to evaluate electrolytes and kidney function, as long as things look stable we will plan to increase valsartan to 320 mg p.o. daily.

## 2022-05-22 ENCOUNTER — Other Ambulatory Visit: Payer: Self-pay

## 2022-05-30 ENCOUNTER — Telehealth: Payer: Self-pay | Admitting: Pharmacist

## 2022-05-30 ENCOUNTER — Other Ambulatory Visit (HOSPITAL_COMMUNITY): Payer: Self-pay

## 2022-05-30 ENCOUNTER — Ambulatory Visit: Payer: PPO | Admitting: Pharmacist

## 2022-05-30 ENCOUNTER — Other Ambulatory Visit: Payer: Self-pay

## 2022-05-30 DIAGNOSIS — E1149 Type 2 diabetes mellitus with other diabetic neurological complication: Secondary | ICD-10-CM

## 2022-05-30 MED ORDER — FREESTYLE LIBRE 3 SENSOR MISC
11 refills | Status: DC
  Filled 2022-05-30: qty 2, 28d supply, fill #0
  Filled 2022-07-04: qty 2, 28d supply, fill #1
  Filled 2022-08-10: qty 2, 28d supply, fill #2
  Filled 2022-09-19: qty 2, 28d supply, fill #3
  Filled 2022-11-07: qty 2, 28d supply, fill #4
  Filled 2022-12-11: qty 2, 28d supply, fill #5
  Filled 2023-01-16: qty 2, 28d supply, fill #6
  Filled 2023-02-12: qty 2, 28d supply, fill #7
  Filled 2023-04-16: qty 2, 28d supply, fill #8
  Filled 2023-05-25: qty 2, 28d supply, fill #9

## 2022-05-30 NOTE — Progress Notes (Signed)
Care Management & Coordination Services Pharmacy Note  05/30/2022 Name:  Patricia Horne MRN:  [id number] DOB:  [birth year]  Summary: -HTN: BP was 180/86 2 weeks ago, pt reports home BP 159/79 is lowest she has seen since increasing valsartan dose, she continues on 320 mg daily -DM: A1c 6.7% (03/2022) at goal on Ozempic 0.25 mg weekly; she uses Libre 3 CGM without issue at this time  Recommendations/Changes made from today's visit: -No med changes - PCP appt later this week to address HTN; consider re-trial of amlodipine 5 mg -Refilled Libre 3 sensors per pt request  Follow up plan: -Pharmacist follow up televisit scheduled for 1 month -PCP appt 06/02/22    Subjective: Patricia Horne is an 72 y.o. year old female who is a primary patient of Bedsole, Amy E, MD.  The care coordination team was consulted for assistance with disease management and care coordination needs.    Engaged with patient by telephone for follow up visit.  Recent office visits: 05/19/22 Dr Ermalene Searing OV: f/u - BP 180/86; increase valsartan to 320 mg. Issues with cgm  04/21/22 Dr Ermalene Searing OV: A1c 6.7%  Recent consult visits: N/a  Hospital visits: None in previous 6 months   Objective:  Lab Results  Component Value Date   CREATININE 1.04 05/19/2022   BUN 26 (H) 05/19/2022   GFR 53.99 (L) 05/19/2022   GFRNONAA >60 09/30/2012   GFRAA >60 09/30/2012   NA 138 05/19/2022   K 4.7 05/19/2022   CALCIUM 9.2 05/19/2022   CO2 27 05/19/2022   GLUCOSE 97 05/19/2022    Lab Results  Component Value Date/Time   HGBA1C 6.7 (A) 04/21/2022 10:37 AM   HGBA1C 5.7 (A) 12/09/2021 11:30 AM   HGBA1C 7.0 (H) 06/24/2021 09:00 AM   HGBA1C 9.6 (H) 11/30/2020 10:29 AM   HGBA1C 13.9 06/05/2017 12:00 AM   GFR 53.99 (L) 05/19/2022 11:09 AM   GFR 42.24 (L) 11/04/2021 12:01 PM   MICROALBUR <0.7 12/09/2021 12:56 PM   MICROALBUR 1.9 04/13/2016 09:47 AM    Last diabetic Eye exam:  Lab Results  Component Value Date/Time    HMDIABEYEEXA Retinopathy (A) 03/24/2020 12:00 AM    Last diabetic Foot exam:  Lab Results  Component Value Date/Time   HMDIABFOOTEX done 08/12/2021 12:00 AM     Lab Results  Component Value Date   CHOL 163 06/24/2021   HDL 46.60 06/24/2021   LDLCALC 96 06/24/2021   TRIG 99.0 06/24/2021   CHOLHDL 3 06/24/2021       Latest Ref Rng & Units 11/04/2021   12:01 PM 08/12/2021    9:51 AM 06/24/2021    9:00 AM  Hepatic Function  Total Protein 6.0 - 8.3 g/dL 6.9  7.7  7.5   Albumin 3.5 - 5.2 g/dL 3.6  3.9  3.7   AST 0 - 37 U/L 16  15  13    ALT 0 - 35 U/L 12  10  9    Alk Phosphatase 39 - 117 U/L 64  64  70   Total Bilirubin 0.2 - 1.2 mg/dL 0.6  0.6  0.6     Lab Results  Component Value Date/Time   TSH 1.59 08/12/2021 09:51 AM   TSH 1.07 09/23/2019 01:06 PM       Latest Ref Rng & Units 11/04/2021   12:01 PM 08/12/2021    9:51 AM 09/23/2019    1:06 PM  CBC  WBC 4.0 - 10.5 K/uL 5.5  6.8  8.2   Hemoglobin  12.0 - 15.0 g/dL 16.1  09.6  04.5   Hematocrit 36.0 - 46.0 % 39.5  38.3  38.3   Platelets 150.0 - 400.0 K/uL 269.0  285.0  267.0     Lab Results  Component Value Date/Time   VD25OH 54.36 08/12/2021 09:51 AM   VD25OH 9.87 (L) 09/23/2019 01:06 PM   VITAMINB12 279 08/12/2021 09:51 AM   VITAMINB12 258 09/23/2019 01:06 PM    Clinical ASCVD: No  The 10-year ASCVD risk score (Arnett DK, et al., 2019) is: 38.8%   Values used to calculate the score:     Age: 57 years     Sex: Female     Is Non-Hispanic African American: Yes     Diabetic: Yes     Tobacco smoker: No     Systolic Blood Pressure: 180 mmHg     Is BP treated: Yes     HDL Cholesterol: 46.6 mg/dL     Total Cholesterol: 163 mg/dL        05/08/8117    1:47 AM 04/21/2022   10:31 AM 03/30/2021    2:57 PM  Depression screen PHQ 2/9  Decreased Interest 0 1 0  Down, Depressed, Hopeless 1 1 0  PHQ - 2 Score 1 2 0  Altered sleeping 0 3   Tired, decreased energy 0 3   Change in appetite 0 0   Feeling bad or failure  about yourself  0 1   Trouble concentrating 0 1   Moving slowly or fidgety/restless 0 0   Suicidal thoughts 0 0   PHQ-9 Score 1 10   Difficult doing work/chores Not difficult at all Somewhat difficult      Social History   Tobacco Use  Smoking Status Never  Smokeless Tobacco Never   BP Readings from Last 3 Encounters:  05/19/22 (!) 180/86  05/17/22 (!) 184/96  04/21/22 (!) 182/88   Pulse Readings from Last 3 Encounters:  05/19/22 86  04/21/22 70  12/09/21 74   Wt Readings from Last 3 Encounters:  05/19/22 171 lb 4 oz (77.7 kg)  05/17/22 173 lb (78.5 kg)  04/21/22 174 lb (78.9 kg)   BMI Readings from Last 3 Encounters:  05/19/22 32.36 kg/m  05/17/22 32.69 kg/m  04/21/22 32.88 kg/m    Allergies  Allergen Reactions   Amoxicillin Diarrhea    Medications Reviewed Today     Reviewed by Kathyrn Sheriff, [facility] (Pharmacist) on 05/30/22 at 1516  Med List Status: <None>   Medication Order Taking? Sig Documenting Provider Last Dose Status Informant  acetaminophen (TYLENOL) 500 MG tablet [id number] Yes Take 500 mg by mouth every 6 (six) hours as needed. [provider] Taking Active   Cholecalciferol (VITAMIN D) 50 MCG (2000 UT) CAPS [id number] Yes Take by mouth. [provider] Taking Active   Continuous Glucose Sensor (FREESTYLE LIBRE 3 SENSOR) [place] [id number] Yes Place 1 sensor on the skin every 14 days. Use to check glucose continuously Ermalene Searing, Amy E, MD Taking Active   glucose monitoring kit (FREESTYLE) monitoring kit [id number] Yes 1 each by Does not apply route as needed for other. Excell Seltzer, MD Taking Active   linaclotide Karlene Einstein) 145 MCG CAPS capsule [id number] Yes Take 1 capsule (145 mcg total) by mouth daily before breakfast. Ermalene Searing, Amy E, MD Taking Active   omeprazole (PRILOSEC) 20 MG capsule [id number] Yes Take 1 capsule (20 mg total) by mouth daily. Excell Seltzer, MD Taking Active   Semaglutide,0.25 or 0.5MG /DOS, (OZEMPIC, 0.25 OR  0.5  MG/DOSE,) 2 MG/3ML SOPN [id number] Yes Inject 0.25 mg into the skin once a week. For 4 weeks then increase to 0.5 mg weekly. VIA [employer] PAP [provider] Taking Active   valsartan (DIOVAN) 320 MG tablet [id number] Yes Take 1 tablet (320 mg total) by mouth daily. Excell Seltzer, MD Taking Active             SDOH:  (Social Determinants of Health) assessments and interventions performed: No SDOH Interventions    Flowsheet Row Clinical Support from 05/17/2022 in [facility] at [facility] Visit from 04/21/2022 in [facility] at [facility] Chronic Care Management from 05/11/2020 in [facility] at [facility] Chronic Care Management from 02/10/2020 in [facility] at [facility] Clinical Support from 06/12/2018 in [facility] at [place]  SDOH Interventions       Food Insecurity Interventions Intervention Not Indicated -- -- -- --  Housing Interventions Intervention Not Indicated -- -- -- --  Transportation Interventions Intervention Not Indicated -- -- -- --  Utilities Interventions Intervention Not Indicated -- -- -- --  Alcohol Usage Interventions Intervention Not Indicated (Score <7) -- -- -- --  Depression Interventions/Treatment  -- Counseling -- -- PHQ2-9 Score <4 Follow-up Not Indicated  Financial Strain Interventions Intervention Not Indicated -- Intervention Not Indicated  [Meds affordable] Intervention Not Indicated --  Physical Activity Interventions Intervention Not Indicated -- -- -- --  Stress Interventions Intervention Not Indicated -- -- -- --  Social Connections Interventions Intervention Not Indicated -- -- -- --       Medication Assistance:  [employer] Cares PAP 2024  Medication Access: Within the past 30 days, how often has patient missed a dose of medication? 0 Is a pillbox or other method used to improve adherence? No  Factors that may affect  medication adherence? no barriers identified Are meds synced by current pharmacy? No  Are meds delivered by current pharmacy? Yes  Does patient experience delays in picking up medications due to transportation concerns? No   [facility] Services Reviewed: Is patient disadvantaged to use [facility]?: Yes  Current Rx insurance plan: HTA Name and location of Current pharmacy:  [facility] [address] [place] [address] Phone: [phone] Fax: [phone]  [facility] services reviewed with patient today?: No  Patient requests to transfer care to [facility]?: No  Reason patient declined to change pharmacies: Disadvantaged due to insurance/mail order  Compliance/Adherence/Medication fill history: Care Gaps: Colonoscopy (due 12/2020) Eye exam (due 03/2021)  Star-Rating Drugs: Valsartan - PDC 100% Ozempic - PAP   Assessment/Plan  Hypertension (BP goal <130/80) -Uncontrolled - BP is very elevated, somewhat improved with higher dose of valsartan but not at goal; pt has history of chronic intermittent dizziness and blurred vision which pt reports are worse when takes BP meds -Current home readings: 159/79 -Current treatment: Valsartan 320 mg daily PM - Appropriate, Effective, Safe, Accessible -Medications previously tried: amlodipine, losartan, HCTZ -Educated on BP goals and benefits of medications for prevention of heart attack, stroke and kidney damage; Importance of home blood pressure monitoring; Proper BP monitoring technique; -Reviewed next options for BP lowering: re-trial of amlodipine or HCTZ; would lean toward amlodipine as pt endorses 2-3 episodes nocturia per night -Counseled to monitor BP at home daily, document, and provide log at future appointments -Recommended to continue current medication; keep PCP appt 5/3  Diabetes (  A1c goal <7%) -Controlled - A1c 6.7% (05/2022) -Pt is wearing CGM - Freestyle  Libre. She is not connected to Libreview. -Current medications: Ozempic 0.25 mg weekly - Appropriate, Effective, Safe, Accessible Freestyle Libre 3 (phone) -Medications previously tried: Merchandiser, retail (cost)  -Educated on A1c and blood sugar goals; -Counseled to check feet daily and get yearly eye exams -Recommended to continue current medication; connect with [employer] at next visit  Hyperlipidemia: (LDL goal < 70) -Uncontrolled - LDL 96 (05/2021) above ideal goal; pt has documented history of statin intolerance but unclear which statin she has tried -Current treatment: none -Medications previously tried: unknown  -Educated on Cholesterol goals;  -Plan to discuss statin history at next visit   Al Corpus, PharmD, [place]y [place], CPP Clinical Sports administrator Healthcare at [facility] [phone]

## 2022-05-30 NOTE — Telephone Encounter (Signed)
Care Management & Coordination Services Outreach Note  05/30/2022 Name: Patricia Horne MRN: [id number] DOB: 1950-05-19  Referred by: Excell Seltzer, MD  Patient had a phone appointment scheduled with clinical pharmacist today.  An unsuccessful telephone outreach was attempted today. The patient was referred to the pharmacist for assistance with medications, care management and care coordination.   Patient will NOT be penalized in any way for missing a Care Management & Coordination Services appointment. The no-show fee does not apply.  If possible, a message was left to return call to: [phone] or to [facility].  Al Corpus, PharmD, BCACP Clinical Pharmacist [facility] at [facility] [phone]

## 2022-05-31 DIAGNOSIS — E113511 Type 2 diabetes mellitus with proliferative diabetic retinopathy with macular edema, right eye: Secondary | ICD-10-CM | POA: Diagnosis not present

## 2022-06-02 ENCOUNTER — Other Ambulatory Visit: Payer: Self-pay

## 2022-06-02 ENCOUNTER — Ambulatory Visit (INDEPENDENT_AMBULATORY_CARE_PROVIDER_SITE_OTHER): Payer: PPO | Admitting: Family Medicine

## 2022-06-02 ENCOUNTER — Telehealth: Payer: Self-pay | Admitting: Pharmacist

## 2022-06-02 ENCOUNTER — Encounter: Payer: Self-pay | Admitting: Family Medicine

## 2022-06-02 VITALS — BP 166/92 | HR 85 | Temp 97.7°F | Ht 61.0 in | Wt 170.1 lb

## 2022-06-02 DIAGNOSIS — I152 Hypertension secondary to endocrine disorders: Secondary | ICD-10-CM | POA: Diagnosis not present

## 2022-06-02 DIAGNOSIS — E1149 Type 2 diabetes mellitus with other diabetic neurological complication: Secondary | ICD-10-CM | POA: Diagnosis not present

## 2022-06-02 DIAGNOSIS — E1159 Type 2 diabetes mellitus with other circulatory complications: Secondary | ICD-10-CM

## 2022-06-02 MED ORDER — VALSARTAN-HYDROCHLOROTHIAZIDE 320-25 MG PO TABS
1.0000 | ORAL_TABLET | Freq: Every day | ORAL | 3 refills | Status: DC
  Filled 2022-06-02: qty 90, 90d supply, fill #0

## 2022-06-02 MED ORDER — OZEMPIC (0.25 OR 0.5 MG/DOSE) 2 MG/3ML ~~LOC~~ SOPN: 0.5000 mg | PEN_INJECTOR | SUBCUTANEOUS | 0 refills | Status: DC

## 2022-06-02 MED ORDER — VALSARTAN-HYDROCHLOROTHIAZIDE 320-25 MG PO TABS
1.0000 | ORAL_TABLET | Freq: Every day | ORAL | 11 refills | Status: DC
  Filled 2022-06-02: qty 30, 30d supply, fill #0

## 2022-06-02 NOTE — Assessment & Plan Note (Signed)
Chronic, inadequate control She has been compliant with taking her medication.  She is using valsartan 320 mg daily for the last 2 weeks.  Blood pressure has improved some but is not yet at goal.  She does report  possible side effects to this medication but is willing to continue it.  She does not want to switch to a different medicine like amlodipine as she had possible side effects about as well. We discussed that her symptoms may not be secondary to the medication but instead to persistently elevated blood pressure.  We will change to valsartan 320 mg daily but including HCTZ 25 mg p.o. daily. She will follow-up in 2 weeks for recheck blood pressure.

## 2022-06-02 NOTE — Telephone Encounter (Signed)
Faxed refill form to [employer]. Please inform patient, we will let her know when Ozempic arrives at office.

## 2022-06-02 NOTE — Telephone Encounter (Signed)
Patient will need refill of Ozempic, the office has not received any shipments since 03/01/22 (1 box). Will contact [facility] [employer] for update on status of refill.

## 2022-06-02 NOTE — Assessment & Plan Note (Addendum)
Chronic, good control with 94% of blood sugars in range on semaglutide 0.25 mg weekly.  She would like to increase this medication to reduce the time her blood sugars above 180 after meals and to assist with continued weight loss.  Semaglutide 0.5 mg weekly Getting through [employer] PAP Contacted Mardella Layman for [employer], pharmacist.  She will contact [employer] to expedite new medication samples.

## 2022-06-02 NOTE — Telephone Encounter (Cosign Needed)
Spoke with [facility] in regards to patient's Ozempic refills. [facility] stated they need a refill form filled out. They will fax the form to Al Corpus, PharmD. Please make sure the "opted out of refill" box is NOT checked so patient can be enrolled in auto-refills.   Al Corpus, PharmD notified  Claudina Lick, [place] Clinical Pharmacy Assistant [phone]

## 2022-06-02 NOTE — Progress Notes (Signed)
Patient ID: Patricia Horne, female    DOB: 11-17-1950, 72 y.o.   MRN: [id number]  This visit was conducted in person.  BP (!) 166/92 (BP Location: Left Arm, Patient Position: Sitting, Cuff Size: Normal)   Pulse 85   Temp 97.7 F (36.5 C) (Temporal)   Ht 5\' 1"  (1.549 m)   Wt 170 lb 2 oz (77.2 kg)   SpO2 98%   BMI 32.14 kg/m    CC:  Chief Complaint  Patient presents with   Medical Management of Chronic Issues    2 week f/u: HTN and DM    Subjective:   HPI: Patricia Horne is a 72 y.o. female presenting on 06/02/2022 for Medical Management of Chronic Issues (2 week f/u: HTN and DM) Reviewed last pharmacy medication management visit May 04, 2022  Changed to valsartan given longer half-life compared to losartan.  Hypertension:  Poor control in office today but improved from last office visit. On 4/19, I recommended she increase the valsartan to 320 mg daily... she feels this med makes her feel nauseated, tired....  she does state that the   lower dose and losartan made her feel tired too.  She has been compliant with taking the medication daily.  In the past she had been on amlodipine 10 mg p.o. daily but is not currently taking. BP Readings from Last 3 Encounters:  06/02/22 (!) 166/92  05/19/22 (!) 180/86  05/17/22 (!) 184/96  Using medication without problems or lightheadedness: none Chest pain with exertion:none Edema: occ, some in hands. Short of breath:none Average home BPs: 193/100, 173/98. 159/80 Other issues:  Diabetes: Recent change from Mounjaro to Ozempic, on 0.25 mg weekly.. NO SE.  She would like yo increase to 0.5 for weihgt loss and to reduce time  glucose > 180 after meals Lab Results  Component Value Date   HGBA1C 6.7 (A) 04/21/2022  Using medications without difficulties: Hypoglycemic episodes: none Hyperglycemic episodes: none Feet problems: no ulcers Blood Sugars averaging:  Now has CGM:  94% time in range... 6% 180-250, no lows < 70. eye exam  within last year: yes weekly, but last DR exam 2022, not appropriate now per ophthalmology.       Relevant past medical, surgical, family and social history reviewed and updated as indicated. Interim medical history since our last visit reviewed. Allergies and medications reviewed and updated. Outpatient Medications Prior to Visit  Medication Sig Dispense Refill   acetaminophen (TYLENOL) 500 MG tablet Take 500 mg by mouth every 6 (six) hours as needed.     Cholecalciferol (VITAMIN D) 50 MCG (2000 UT) CAPS Take by mouth.     Continuous Glucose Sensor (FREESTYLE LIBRE 3 SENSOR) MISC Place 1 sensor on the skin every 14 days. Use to check glucose continuously 2 each 11   glucose monitoring kit (FREESTYLE) monitoring kit 1 each by Does not apply route as needed for other. 1 each 0   linaclotide (LINZESS) 145 MCG CAPS capsule Take 1 capsule (145 mcg total) by mouth daily before breakfast. 30 capsule 11   omeprazole (PRILOSEC) 20 MG capsule Take 1 capsule (20 mg total) by mouth daily. 30 capsule 3   Semaglutide,0.25 or 0.5MG /DOS, (OZEMPIC, 0.25 OR 0.5 MG/DOSE,) 2 MG/3ML SOPN Inject 0.25 mg into the skin once a week. For 4 weeks then increase to 0.5 mg weekly. VIA [employer] CARES PAP     valsartan (DIOVAN) 320 MG tablet Take 1 tablet (320 mg total) by mouth daily. 30 tablet  11   No facility-administered medications prior to visit.     Per HPI unless specifically indicated in ROS section below Review of Systems  Constitutional:  Negative for fatigue and fever.  HENT:  Negative for congestion.   Eyes:  Negative for pain.  Respiratory:  Negative for cough and shortness of breath.   Cardiovascular:  Negative for chest pain, palpitations and leg swelling.  Gastrointestinal:  Negative for abdominal pain.  Genitourinary:  Negative for dysuria and vaginal bleeding.  Musculoskeletal:  Negative for back pain.  Neurological:  Negative for syncope, light-headedness and headaches.  Psychiatric/Behavioral:   Negative for dysphoric mood.    Objective:  BP (!) 166/92 (BP Location: Left Arm, Patient Position: Sitting, Cuff Size: Normal)   Pulse 85   Temp 97.7 F (36.5 C) (Temporal)   Ht 5\' 1"  (1.549 m)   Wt 170 lb 2 oz (77.2 kg)   SpO2 98%   BMI 32.14 kg/m   Wt Readings from Last 3 Encounters:  06/02/22 170 lb 2 oz (77.2 kg)  05/19/22 171 lb 4 oz (77.7 kg)  05/17/22 173 lb (78.5 kg)      Physical Exam Constitutional:      General: She is not in acute distress.    Appearance: Normal appearance. She is well-developed. She is not ill-appearing or toxic-appearing.  HENT:     Head: Normocephalic.     Right Ear: Hearing, tympanic membrane, ear canal and external ear normal. Tympanic membrane is not erythematous, retracted or bulging.     Left Ear: Hearing, tympanic membrane, ear canal and external ear normal. Tympanic membrane is not erythematous, retracted or bulging.     Nose: No mucosal edema or rhinorrhea.     Right Sinus: No maxillary sinus tenderness or frontal sinus tenderness.     Left Sinus: No maxillary sinus tenderness or frontal sinus tenderness.     Mouth/Throat:     Pharynx: Uvula midline.  Eyes:     General: Lids are normal. Lids are everted, no foreign bodies appreciated.     Conjunctiva/sclera: Conjunctivae normal.     Pupils: Pupils are equal, round, and reactive to light.  Neck:     Thyroid: No thyroid mass or thyromegaly.     Vascular: No carotid bruit.     Trachea: Trachea normal.  Cardiovascular:     Rate and Rhythm: Normal rate and regular rhythm.     Pulses: Normal pulses.     Heart sounds: Normal heart sounds, S1 normal and S2 normal. No murmur heard.    No friction rub. No gallop.  Pulmonary:     Effort: Pulmonary effort is normal. No tachypnea or respiratory distress.     Breath sounds: Normal breath sounds. No decreased breath sounds, wheezing, rhonchi or rales.  Abdominal:     General: Bowel sounds are normal.     Palpations: Abdomen is soft.      Tenderness: There is no abdominal tenderness.  Musculoskeletal:     Cervical back: Normal range of motion and neck supple.  Skin:    General: Skin is warm and dry.     Findings: No rash.  Neurological:     Mental Status: She is alert.  Psychiatric:        Mood and Affect: Mood is not anxious or depressed.        Speech: Speech normal.        Behavior: Behavior normal. Behavior is cooperative.        Thought Content: Thought  content normal.        Judgment: Judgment normal.       Results for orders placed or performed in visit on 05/19/22  Basic Metabolic Panel  Result Value Ref Range   Sodium 138 135 - 145 mEq/L   Potassium 4.7 3.5 - 5.1 mEq/L   Chloride 104 96 - 112 mEq/L   CO2 27 19 - 32 mEq/L   Glucose, Bld 97 70 - 99 mg/dL   BUN 26 (H) 6 - 23 mg/dL   Creatinine, Ser 4.09 0.40 - 1.20 mg/dL   GFR 81.19 (L) >14.78 mL/min   Calcium 9.2 8.4 - 10.5 mg/dL    Assessment and Plan  Type 2 diabetes mellitus with neurological complications (HCC) Assessment & Plan: Chronic, good control with 94% of blood sugars in range on semaglutide 0.25 mg weekly.  She would like to increase this medication to reduce the time her blood sugars above 180 after meals and to assist with continued weight loss.  Semaglutide 0.5 mg weekly Getting through [employer] PAP Contacted Mardella Layman for [employer], pharmacist.  She will contact [employer] to expedite new medication samples.   Hypertension associated with diabetes [facility]) Assessment & Plan: Chronic, inadequate control She has been compliant with taking her medication.  She is using valsartan 320 mg daily for the last 2 weeks.  Blood pressure has improved some but is not yet at goal.  She does report  possible side effects to this medication but is willing to continue it.  She does not want to switch to a different medicine like amlodipine as she had possible side effects about as well. We discussed that her symptoms may not be secondary to the medication  but instead to persistently elevated blood pressure.  We will change to valsartan 320 mg daily but including HCTZ 25 mg p.o. daily. She will follow-up in 2 weeks for recheck blood pressure.   Other orders -     Valsartan-hydroCHLOROthiazide; Take 1 tablet by mouth daily.  Dispense: 30 tablet; Refill: 11 -     Ozempic (0.25 or 0.5 MG/DOSE); Inject 0.5 mg into the skin once a week. VIA [employer] CARES PAP  Dispense: 3 mL; Refill: 0     Return in about 2 weeks (around 06/16/2022) for diabetes and  HTN follow up.   Kerby Nora, MD

## 2022-06-07 ENCOUNTER — Emergency Department: Payer: PPO

## 2022-06-07 ENCOUNTER — Encounter: Payer: Self-pay | Admitting: Emergency Medicine

## 2022-06-07 ENCOUNTER — Other Ambulatory Visit: Payer: Self-pay

## 2022-06-07 ENCOUNTER — Emergency Department
Admission: EM | Admit: 2022-06-07 | Discharge: 2022-06-07 | Disposition: A | Discharge status: Home / Self Care | Payer: PPO | Attending: Emergency Medicine | Admitting: Emergency Medicine

## 2022-06-07 DIAGNOSIS — G500 Trigeminal neuralgia: Secondary | ICD-10-CM | POA: Diagnosis not present

## 2022-06-07 DIAGNOSIS — Z8673 Personal history of transient ischemic attack (TIA), and cerebral infarction without residual deficits: Secondary | ICD-10-CM | POA: Insufficient documentation

## 2022-06-07 DIAGNOSIS — R519 Headache, unspecified: Secondary | ICD-10-CM | POA: Insufficient documentation

## 2022-06-07 MED ORDER — KETOROLAC TROMETHAMINE 0.5 % OP SOLN
1.0000 [drp] | Freq: Four times a day (QID) | OPHTHALMIC | 0 refills | Status: DC
  Filled 2022-06-07: qty 5, 25d supply, fill #0

## 2022-06-07 NOTE — ED Triage Notes (Signed)
Patient to ED for left sided facial pain- started yesterday afternoon. No facial droop noted. Denies numbness or tingling.

## 2022-06-07 NOTE — Telephone Encounter (Cosign Needed)
Attempted contacting patient again. No answer; left message.  Al Corpus, PharmD notified  Claudina Lick, [place] Clinical Pharmacy Assistant [phone]

## 2022-06-07 NOTE — ED Provider Notes (Signed)
[facility] Provider Note    Event Date/Time   First MD Initiated Contact with Patient 06/07/22 1105     (approximate)   History   Facial Pain   HPI  Patricia Horne is a 72 y.o. female presents emergency department with left-sided headache for 1 day.  Patient states symptoms started last night were severe coming from the eye down the left cheek.  States this time a tingling type pain.  Does get injections in her eyes but the last one was on the right, is due for the one on the left today.  No fever or chills.  No rash.  Patient states she never had chickenpox as she does not think it is shingles.  No vomiting, no change in vision, no tingling, no other deficits reported by the patient.      Physical Exam   Triage Vital Signs: ED Triage Vitals [06/07/22 0956]  Enc Vitals Group     BP (!) 148/97     Pulse Rate 85     Resp 18     Temp 98.4 F (36.9 C)     Temp Source Oral     SpO2 98 %     Weight      Height      Head Circumference      Peak Flow      Pain Score 5     Pain Loc      Pain Edu?      Excl. in GC?     Most recent vital signs: Vitals:   06/07/22 0956  BP: (!) 148/97  Pulse: 85  Resp: 18  Temp: 98.4 F (36.9 C)  SpO2: 98%     General: Awake, no distress.   CV:  Good peripheral perfusion. regular rate and  rhythm Resp:  Normal effort.  Abd:  No distention.   Other:  PERRL, EOMI, left temporal area is nontender, left forehead is nontender, I do not see a rash, area from the orbit down through to the mandible slightly tender I do not see a rash, neurovascular is intact, cranial nerves II through XII grossly intact   ED Results / Procedures / Treatments   Labs (all labs ordered are listed, but only abnormal results are displayed) Labs Reviewed - No data to display   EKG     RADIOLOGY CT of the head maxillofacial    PROCEDURES:   Procedures   MEDICATIONS ORDERED IN ED: Medications - No data to  display   IMPRESSION / MDM / ASSESSMENT AND PLAN / ED COURSE  I reviewed the triage vital signs and the nursing notes.                              Differential diagnosis includes, but is not limited to, neuralgia, CVA, sinusitis, temporal arteritis  Patient's presentation is most consistent with acute presentation with potential threat to life or bodily function.   Patient appears to be very well, pain has decreased that she took Tylenol this morning.  I do not feel this is a CVA however we will still do a scan.  Will that temporal arteritis is less likely as she does not have any tenderness along the temporal and parietal area   I did review the radiologist reading of CT of the head and maxillofacial, I agree with his interpretation this appears to be negative for any acute abnormality.  I did  also review the images  I did explain the findings to the patient.  Patient states she was just worried she had had a stroke.  With no deficits at this time and pain has localized strictly to the face I do not feel that she has had a CVA.  Feel this is more of a neuralgia.  We did discuss trigeminal neuralgia versus herpes zoster.  She is to follow-up with her regular doctor.  She develops a rash she should return emergency department soon as possible.   FINAL CLINICAL IMPRESSION(S) / ED DIAGNOSES   Final diagnoses:  Facial pain  Bad headache     Rx / DC Orders   ED Discharge Orders     None        Note:  This document was prepared using Dragon voice recognition software and may include unintentional dictation errors.    Faythe Ghee, PA-C 06/07/22 1222    Chesley Noon, MD 06/07/22 [phone]

## 2022-06-07 NOTE — Discharge Instructions (Signed)
If symptoms worsen or you get a rash she will need treatment for herpes zoster.  Do not feel that this is trigeminal neuralgia at this time.  However look at the symptoms as noted on your discharge instructions and if you begin to get these you will need to see your doctor or return emergency department

## 2022-06-07 NOTE — ED Notes (Signed)
Pt reports that she started having pain on the left side of her face yesterday, states that it started over her left eyebrow and begins to radiate to the left side of her face and jaw, pt went to her dentist prior to arrival and had xrays done without signs of infection or dental issue, pt reports that she has been having issues with getting her bp under control also, pt denies chest pain. Pt also denies having any hx of nerve pain in her face, no rash or swelling noted

## 2022-06-08 ENCOUNTER — Ambulatory Visit (INDEPENDENT_AMBULATORY_CARE_PROVIDER_SITE_OTHER): Payer: PPO | Admitting: Family Medicine

## 2022-06-08 ENCOUNTER — Encounter: Payer: Self-pay | Admitting: Family Medicine

## 2022-06-08 ENCOUNTER — Other Ambulatory Visit: Payer: Self-pay

## 2022-06-08 VITALS — BP 146/93 | HR 93 | Temp 98.2°F | Ht 61.0 in | Wt 165.2 lb

## 2022-06-08 DIAGNOSIS — R6884 Jaw pain: Secondary | ICD-10-CM | POA: Diagnosis not present

## 2022-06-08 DIAGNOSIS — R519 Headache, unspecified: Secondary | ICD-10-CM | POA: Diagnosis not present

## 2022-06-08 DIAGNOSIS — R112 Nausea with vomiting, unspecified: Secondary | ICD-10-CM | POA: Diagnosis not present

## 2022-06-08 DIAGNOSIS — R0789 Other chest pain: Secondary | ICD-10-CM | POA: Diagnosis not present

## 2022-06-08 LAB — COMPREHENSIVE METABOLIC PANEL
ALT: 10 U/L (ref 0–35)
AST: 16 U/L (ref 0–37)
Albumin: 3.8 g/dL (ref 3.5–5.2)
Alkaline Phosphatase: 57 U/L (ref 39–117)
BUN: 21 mg/dL (ref 6–23)
CO2: 29 mEq/L (ref 19–32)
Calcium: 9.3 mg/dL (ref 8.4–10.5)
Chloride: 101 mEq/L (ref 96–112)
Creatinine, Ser: 1.25 mg/dL — ABNORMAL HIGH (ref 0.40–1.20)
GFR: 43.28 mL/min — ABNORMAL LOW (ref >60.00)
Glucose, Bld: 95 mg/dL (ref 70–99)
Potassium: 4.3 mEq/L (ref 3.5–5.1)
Sodium: 140 mEq/L (ref 135–145)
Total Bilirubin: 1.1 mg/dL (ref 0.2–1.2)
Total Protein: 7.3 g/dL (ref 6.0–8.3)

## 2022-06-08 LAB — CBC WITH DIFFERENTIAL/PLATELET
Basophils Absolute: 0.1 10*3/uL (ref 0.0–0.1)
Basophils Relative: 1 % (ref 0.0–3.0)
Eosinophils Absolute: 0 10*3/uL (ref 0.0–0.7)
Eosinophils Relative: 0.5 % (ref 0.0–5.0)
HCT: 40.8 % (ref 36.0–46.0)
Hemoglobin: 13.8 g/dL (ref 12.0–15.0)
Lymphocytes Relative: 23.4 % (ref 12.0–46.0)
Lymphs Abs: 1.8 10*3/uL (ref 0.7–4.0)
MCHC: 33.7 g/dL (ref 30.0–36.0)
MCV: 93.5 fl (ref 78.0–100.0)
Monocytes Absolute: 0.5 10*3/uL (ref 0.1–1.0)
Monocytes Relative: 6.7 % (ref 3.0–12.0)
Neutro Abs: 5.3 10*3/uL (ref 1.4–7.7)
Neutrophils Relative %: 68.4 % (ref 43.0–77.0)
Platelets: 282 10*3/uL (ref 150.0–400.0)
RBC: 4.36 Mil/uL (ref 3.87–5.11)
RDW: 12.9 % (ref 11.5–15.5)
WBC: 7.8 10*3/uL (ref 4.0–10.5)

## 2022-06-08 LAB — SEDIMENTATION RATE: Sed Rate: 23 mm/hr (ref 0–30)

## 2022-06-08 LAB — LIPASE: Lipase: 15 U/L (ref 11.0–59.0)

## 2022-06-08 MED ORDER — ONDANSETRON HCL 4 MG PO TABS
4.0000 mg | ORAL_TABLET | Freq: Three times a day (TID) | ORAL | 0 refills | Status: AC | PRN
  Filled 2022-06-08: qty 20, 7d supply, fill #0

## 2022-06-08 MED ORDER — TRAMADOL HCL 50 MG PO TABS
50.0000 mg | ORAL_TABLET | Freq: Three times a day (TID) | ORAL | 0 refills | Status: AC | PRN
  Filled 2022-06-08: qty 15, 5d supply, fill #0

## 2022-06-08 NOTE — Telephone Encounter (Signed)
Please contact [facility] to make sure they are shipping the refill soon.

## 2022-06-08 NOTE — Progress Notes (Signed)
Patient ID: Patricia Horne, female    DOB: 1950/02/15, 72 y.o.   MRN: [id number]  This visit was conducted in person.  BP (!) 146/93   Pulse 93   Temp 98.2 F (36.8 C) (Temporal)   Ht 5\' 1"  (1.549 m)   Wt 165 lb 4 oz (75 kg)   SpO2 99%   BMI 31.22 kg/m    CC:  Chief Complaint  Patient presents with   Eye Pain    Radiates to jaw-Seen in ED 06/07/22-Also saw dentist and eye doctor yesterday    Subjective:   HPI: Patricia Horne is a 72 y.o. female presenting on 06/08/2022 for Eye Pain (Radiates to jaw-Seen in ED 06/07/22-Also saw dentist and eye doctor yesterday)  ER visit that Jun 07, 2022 for facial pain, left-sided headache, severe..  No temporal pain Associated with tingling type pain No fevers, no chills, no rash No focal neurodeficits. Pain improved with Tylenol CT head negative for acute abnormality Maxillofacial CT: No fracture of facial bones, SINUSES CLEAR.    Today she reports continued left eye pain radiating to left  upper jaw She saw her dentist and eye doctor yesterday. Given ketorolac.. helped eye pain some. EyE did not feel it was related to injection.  No tooth issues. Nagging cough,  minimal congestion.  No rash. Did have episode of emesis yesterday... no abd pain. Feels like low grade temp..  One episode of sharp chest pain.Marland Kitchen lasted 5 min.. thought it was something she ate, heartburn.   Blood pressure remains elevated.  At last office visit continued valsartan 320 mg daily but added 25 mg component.   Has not been taking it yesterday or today. Also increase semaglutide to 0.5 mg weekly.... she has note done it yet but may have given herself 2 doses 0.25 mg ( cap was on, so gave herself a second dose)   Tylenol ES.. did not help.  BP Readings from Last 3 Encounters:  06/08/22 (!) 146/93  06/07/22 (!) 148/97  06/02/22 (!) 166/92    Relevant past medical, surgical, family and social history reviewed and updated as indicated. Interim medical  history since our last visit reviewed. Allergies and medications reviewed and updated. Outpatient Medications Prior to Visit  Medication Sig Dispense Refill   acetaminophen (TYLENOL) 500 MG tablet Take 500 mg by mouth every 6 (six) hours as needed.     Cholecalciferol (VITAMIN D) 50 MCG (2000 UT) CAPS Take by mouth.     Continuous Glucose Sensor (FREESTYLE LIBRE 3 SENSOR) MISC Place 1 sensor on the skin every 14 days. Use to check glucose continuously 2 each 11   glucose monitoring kit (FREESTYLE) monitoring kit 1 each by Does not apply route as needed for other. 1 each 0   ketorolac (ACULAR) 0.5 % ophthalmic solution Place 1 drop into the left eye 4 (four) times daily. 5 mL 0   linaclotide (LINZESS) 145 MCG CAPS capsule Take 1 capsule (145 mcg total) by mouth daily before breakfast. 30 capsule 11   omeprazole (PRILOSEC) 20 MG capsule Take 1 capsule (20 mg total) by mouth daily. 30 capsule 3   Semaglutide,0.25 or 0.5MG /DOS, (OZEMPIC, 0.25 OR 0.5 MG/DOSE,) 2 MG/3ML SOPN Inject 0.5 mg into the skin once a week. [facility]IA [facility] PAP 3 mL 0   valsartan-hydrochlorothiazide (DIOVAN-HCT) 320-25 MG tablet Take 1 tablet by mouth daily. 30 tablet 11   No facility-administered medications prior to visit.     Per HPI unless specifically  indicated in ROS section below Review of Systems  Constitutional:  Negative for fatigue and fever.  HENT:  Negative for congestion.   Eyes:  Negative for pain.  Respiratory:  Negative for cough and shortness of breath.   Cardiovascular:  Negative for chest pain, palpitations and leg swelling.  Gastrointestinal:  Positive for nausea and vomiting. Negative for abdominal pain.  Genitourinary:  Negative for dysuria and vaginal bleeding.  Musculoskeletal:  Negative for back pain.  Neurological:  Positive for headaches. Negative for syncope and light-headedness.  Psychiatric/Behavioral:  Negative for dysphoric mood.    Objective:  BP (!) 146/93   Pulse 93   Temp 98.2 F  (36.8 C) (Temporal)   Ht 5\' 1"  (1.549 m)   Wt 165 lb 4 oz (75 kg)   SpO2 99%   BMI 31.22 kg/m   Wt Readings from Last 3 Encounters:  06/08/22 165 lb 4 oz (75 kg)  06/02/22 170 lb 2 oz (77.2 kg)  05/19/22 171 lb 4 oz (77.7 kg)      Physical Exam Constitutional:      General: She is not in acute distress.    Appearance: Normal appearance. She is well-developed. She is not ill-appearing or toxic-appearing.  HENT:     Head: Normocephalic.     Right Ear: Hearing, tympanic membrane, ear canal and external ear normal. Tympanic membrane is not erythematous, retracted or bulging.     Left Ear: Hearing, tympanic membrane, ear canal and external ear normal. Tympanic membrane is not erythematous, retracted or bulging.     Nose: No mucosal edema or rhinorrhea.     Right Sinus: No maxillary sinus tenderness or frontal sinus tenderness.     Left Sinus: No maxillary sinus tenderness or frontal sinus tenderness.     Mouth/Throat:     Pharynx: Uvula midline.  Eyes:     General: Lids are normal. Lids are everted, no foreign bodies appreciated.     Conjunctiva/sclera: Conjunctivae normal.     Pupils: Pupils are equal, round, and reactive to light.  Neck:     Thyroid: No thyroid mass or thyromegaly.     Vascular: No carotid bruit.     Trachea: Trachea normal.  Cardiovascular:     Rate and Rhythm: Normal rate and regular rhythm.     Pulses: Normal pulses.     Heart sounds: Normal heart sounds, S1 normal and S2 normal. No murmur heard.    No friction rub. No gallop.  Pulmonary:     Effort: Pulmonary effort is normal. No tachypnea or respiratory distress.     Breath sounds: Normal breath sounds. No decreased breath sounds, wheezing, rhonchi or rales.  Abdominal:     General: Bowel sounds are normal.     Palpations: Abdomen is soft.     Tenderness: There is no abdominal tenderness.  Musculoskeletal:     Cervical back: Normal range of motion and neck supple.  Skin:    General: Skin is warm and  dry.     Findings: No rash.  Neurological:     Mental Status: She is alert and oriented to person, place, and time.     Cranial Nerves: Cranial nerves 2-12 are intact.     Sensory: Sensation is intact.     Motor: Motor function is intact.     Gait: Gait is intact.     Deep Tendon Reflexes: Reflexes are normal and symmetric.  Psychiatric:        Mood and Affect: Mood is  not anxious or depressed.        Speech: Speech normal.        Behavior: Behavior normal. Behavior is cooperative.        Thought Content: Thought content normal.        Judgment: Judgment normal.       Results for orders placed or performed in visit on 06/08/22  Comprehensive metabolic panel  Result Value Ref Range   Sodium 140 135 - 145 mEq/L   Potassium 4.3 3.5 - 5.1 mEq/L   Chloride 101 96 - 112 mEq/L   CO2 29 19 - 32 mEq/L   Glucose, Bld 95 70 - 99 mg/dL   BUN 21 6 - 23 mg/dL   Creatinine, Ser 1.61 (H) 0.40 - 1.20 mg/dL   Total Bilirubin 1.1 0.2 - 1.2 mg/dL   Alkaline Phosphatase 57 39 - 117 U/L   AST 16 0 - 37 U/L   ALT 10 0 - 35 U/L   Total Protein 7.3 6.0 - 8.3 g/dL   Albumin 3.8 3.5 - 5.2 g/dL   GFR 09.60 (L) >45.40 mL/min   Calcium 9.3 8.4 - 10.5 mg/dL  Lipase  Result Value Ref Range   Lipase 15.0 11.0 - 59.0 U/L  CBC with Differential/Platelet  Result Value Ref Range   WBC 7.8 4.0 - 10.5 K/uL   RBC 4.36 3.87 - 5.11 Mil/uL   Hemoglobin 13.8 12.0 - 15.0 g/dL   HCT 98.1 19.1 - 47.8 %   MCV 93.5 78.0 - 100.0 fl   MCHC 33.7 30.0 - 36.0 g/dL   RDW 29.5 62.1 - 30.8 %   Platelets 282.0 150.0 - 400.0 K/uL   Neutrophils Relative % 68.4 43.0 - 77.0 %   Lymphocytes Relative 23.4 12.0 - 46.0 %   Monocytes Relative 6.7 3.0 - 12.0 %   Eosinophils Relative 0.5 0.0 - 5.0 %   Basophils Relative 1.0 0.0 - 3.0 %   Neutro Abs 5.3 1.4 - 7.7 K/uL   Lymphs Abs 1.8 0.7 - 4.0 K/uL   Monocytes Absolute 0.5 0.1 - 1.0 K/uL   Eosinophils Absolute 0.0 0.0 - 0.7 K/uL   Basophils Absolute 0.1 0.0 - 0.1 K/uL   Sedimentation rate  Result Value Ref Range   Sed Rate 23 0 - 30 mm/hr    Assessment and Plan EKG: normal EKG, normal sinus rhythm, unchanged from previous tracings, nonspecific ST and T waves changes.  Nausea and vomiting, unspecified vomiting type -     Comprehensive metabolic panel -     Lipase -     CBC with Differential/Platelet  Pain in upper jaw  Acute nonintractable headache, unspecified headache type -     Sedimentation rate  Atypical chest pain -     EKG 12-Lead  Other orders -     traMADol HCl; Take 1 tablet (50 mg total) by mouth every 8 (eight) hours as needed for up to 5 days for severe pain.  Dispense: 15 tablet; Refill: 0 -     Ondansetron HCl; Take 1 tablet (4 mg total) by mouth every 8 (eight) hours as needed for nausea or vomiting.  Dispense: 20 tablet; Refill: 0  Unclear etiology of constellation of symptoms.  Most consistent with viral gastroenteritis with associated headache.  Her blood pressure is above goal but she has not taken her medication today.  She usually does not have symptoms from blood pressure elevations.  No signs and symptoms of TIA or CVA.  Encouraged her  to use nausea medicine to be able to eat bland diet and take blood pressure medication. We still do not have a good idea as how well blood pressure will be controlled on valsartan 320/HCTZ 25 mg daily. Will evaluate with labs including complete metabolic panel, lipase and CBC.  Will check sed rate to make sure headache and jaw pain is not related to temporal arteritis. She did have some atypical chest pain and given jaw pain we will evaluate an EKG. Of note EKG was unchanged from previous tracings.  Provided tramadol for patient to use as needed for headache.  Return and ER precautions provided.    No follow-ups on file.   Kerby Nora, MD

## 2022-06-08 NOTE — Patient Instructions (Addendum)
Continue 0.25 mg weekly  semaglutide just in case could  be causing  nausea, emesis.   Please stop at the lab to have labs drawn.  Can use nausea medication as needed.   Make  sure to take BP medication.  Can use tramadol as needed for pain.

## 2022-06-09 ENCOUNTER — Other Ambulatory Visit: Payer: PPO | Admitting: Pharmacist

## 2022-06-09 NOTE — Telephone Encounter (Cosign Needed)
Spoke with [facility]. Medication refill was received. Ozempic is in the process of being shipped to office, but they could not give me exact date since it is in processing.   Al Corpus, PharmD notified  Claudina Lick, [place] Clinical Pharmacy Assistant [phone]

## 2022-06-14 ENCOUNTER — Telehealth: Payer: Self-pay

## 2022-06-14 ENCOUNTER — Other Ambulatory Visit
Admission: RE | Admit: 2022-06-14 | Discharge: 2022-06-14 | Disposition: A | Discharge status: Home / Self Care | Payer: PPO | Source: Ambulatory Visit | Attending: Ophthalmology | Admitting: Ophthalmology

## 2022-06-14 DIAGNOSIS — M316 Other giant cell arteritis: Secondary | ICD-10-CM | POA: Insufficient documentation

## 2022-06-14 DIAGNOSIS — E113511 Type 2 diabetes mellitus with proliferative diabetic retinopathy with macular edema, right eye: Secondary | ICD-10-CM | POA: Diagnosis not present

## 2022-06-14 DIAGNOSIS — R519 Headache, unspecified: Secondary | ICD-10-CM | POA: Diagnosis not present

## 2022-06-14 DIAGNOSIS — E113312 Type 2 diabetes mellitus with moderate nonproliferative diabetic retinopathy with macular edema, left eye: Secondary | ICD-10-CM | POA: Diagnosis not present

## 2022-06-14 LAB — C-REACTIVE PROTEIN: CRP: 0.7 mg/dL (ref <1.0)

## 2022-06-14 LAB — SEDIMENTATION RATE: Sed Rate: 34 mm/hr — ABNORMAL HIGH (ref 0–30)

## 2022-06-14 NOTE — Telephone Encounter (Signed)
Transition Care Management Follow-up Telephone Call Date of discharge and from where? [facility] 5/8 How have you been since you were released from the hospital? Having pain in eye down to jaw Any questions or concerns? Yes  Items Reviewed: Did the pt receive and understand the discharge instructions provided? Yes  Medications obtained and verified? Yes  Other? No  Any new allergies since your discharge? No  Dietary orders reviewed? No Do you have support at home? No    Follow up appointments reviewed:  PCP Hospital f/u appt confirmed? Yes  Scheduled to see  on  @ . Specialist Hospital f/u appt confirmed? Yes  Scheduled to see Optometrist on 5/15 @ . Are transportation arrangements needed? No  If their condition worsens, is the pt aware to call PCP or go to the Emergency Dept.? Yes Was the patient provided with contact information for the PCP's office or ED? Yes Was to pt encouraged to call back with questions or concerns? Yes

## 2022-06-16 ENCOUNTER — Encounter: Payer: Self-pay | Admitting: Family Medicine

## 2022-06-16 ENCOUNTER — Ambulatory Visit (INDEPENDENT_AMBULATORY_CARE_PROVIDER_SITE_OTHER): Payer: PPO | Admitting: Family Medicine

## 2022-06-16 ENCOUNTER — Other Ambulatory Visit: Payer: Self-pay

## 2022-06-16 ENCOUNTER — Telehealth: Payer: Self-pay | Admitting: *Deleted

## 2022-06-16 VITALS — BP 140/74 | HR 73 | Temp 98.0°F | Ht 61.0 in | Wt 168.2 lb

## 2022-06-16 DIAGNOSIS — E1140 Type 2 diabetes mellitus with diabetic neuropathy, unspecified: Secondary | ICD-10-CM | POA: Diagnosis not present

## 2022-06-16 DIAGNOSIS — I152 Hypertension secondary to endocrine disorders: Secondary | ICD-10-CM

## 2022-06-16 DIAGNOSIS — E1159 Type 2 diabetes mellitus with other circulatory complications: Secondary | ICD-10-CM

## 2022-06-16 DIAGNOSIS — E1149 Type 2 diabetes mellitus with other diabetic neurological complication: Secondary | ICD-10-CM

## 2022-06-16 DIAGNOSIS — Z7985 Long-term (current) use of injectable non-insulin antidiabetic drugs: Secondary | ICD-10-CM

## 2022-06-16 MED ORDER — AMLODIPINE BESY-BENAZEPRIL HCL 5-20 MG PO CAPS
1.0000 | ORAL_CAPSULE | Freq: Every day | ORAL | 11 refills | Status: DC
  Filled 2022-06-16: qty 30, 30d supply, fill #0

## 2022-06-16 NOTE — Assessment & Plan Note (Signed)
Chronic, good control   She would like to increase this medication to reduce the time her blood sugars above 180 after meals and to assist with continued weight loss.  Semaglutide 0.5 mg weekly Getting through [employer] PAP.Marland Kitchen  Has now received medication.

## 2022-06-16 NOTE — Telephone Encounter (Signed)
Received from [employer] PAP:  Ozempic 0.25/0.5 mg x 1-3 ml pen.  Lot# [id number] Exp: 11/30/2023.  Patient has appointment today with Dr. Ermalene Searing.  Will give to patient at that visit.

## 2022-06-16 NOTE — Assessment & Plan Note (Signed)
Chronic, stable control   Trial of ALA 600 mg daily

## 2022-06-16 NOTE — Assessment & Plan Note (Addendum)
Chronic, inadequate control.  Blood pressure had seem to be better controlled on losartan and current regimen (she seems to have fluctuations at home work and if still not at goal in our office) and she is much more consistent now with taking the medication at the same time each day.  We will D/C valsartan and hydrochlorothiazide and change to a different combination medication including amlodipine since she responded so well to this in the past. She does say she always has side effects to blood pressure medication but not with 1 more medicine compared to others.  Will start amlodipine 5 mg/20 mg of benazepril.  She will follow-up in 1 7 to 10 days for reevaluation of blood pressure.  She will call if her blood pressure does not seem to be responding at home and we will increase to the max dose of this medication.  Return precautions

## 2022-06-16 NOTE — Patient Instructions (Signed)
Stop  valsartan HCTZ... change to amlodipine/benazepril.

## 2022-06-16 NOTE — Progress Notes (Signed)
Patient ID: Patricia Horne, female    DOB: 1950-07-02, 72 y.o.   MRN: [id number]  This visit was conducted in person.  BP (!) 140/74   Pulse 73   Temp 98 F (36.7 C) (Temporal)   Ht 5\' 1"  (1.549 m)   Wt 168 lb 4 oz (76.3 kg)   SpO2 100%   BMI 31.79 kg/m    CC:  Chief Complaint  Patient presents with   Diabetes   Hypertension    Subjective:   HPI: Patricia Horne is a 72 y.o. female presenting on 06/16/2022 for Diabetes and Hypertension    She reports that jaw ache is better but resolving  She has had an eye  injection with ophthalmology last week.  No further nausea, no vomiting n fever.  Has upcoming cataract surgery.  Reviewed last pharmacy medication management visit May 04, 2022  Changed to valsartan given longer half-life compared to losartan.   HTN: inadequate control. On 4/19, I recommended she increase the valsartan to 320 mg daily... she feels this med makes her feel nauseated, tired....  she does state that the   lower dose and losartan made her feel tired too. HCTZ added with no benefit.  She has been compliant with taking the medication daily.  In the past she had been on amlodipine 10 mg p.o. daily but is not currently taking. BP inititally in office 169 /94, recheck improved BP Readings from Last 3 Encounters:  06/16/22 (!) 140/74  06/08/22 (!) 146/93  06/07/22 (!) 148/97  Using medication without problems or lightheadedness: none Chest pain with exertion:none Edema: occ, some in hands. Short of breath:none Average home BPs: 204/101.. she is not sure she trusts the meter   Diabetes: Recent change from [facility] to Ozempic, on 0.25 mg weekly.. NO SE.  She would like yo increase to 0.5 for weight loss and to reduce time  glucose > 180 after meals Lab Results  Component Value Date   HGBA1C 6.7 (A) 04/21/2022  Using medications without difficulties: Hypoglycemic episodes: none Hyperglycemic episodes: none Feet problems: no ulcers Blood  Sugars averaging:  Now has CGM:  In last 15 days  97% time in range... 3% 180-250, no lows < 70 ( she does say that she hears beeping occ with lows at 68, but it does not seem to be recording) eye exam within last year: yes weekly, but last DR exam 2022, not appropriate now per ophthalmology.  Now has sample of Ozempic from med assistance so will start 0.5 weekly dose.   Wt Readings from Last 3 Encounters:  06/16/22 168 lb 4 oz (76.3 kg)  06/08/22 165 lb 4 oz (75 kg)  06/02/22 170 lb 2 oz (77.2 kg)         Relevant past medical, surgical, family and social history reviewed and updated as indicated. Interim medical history since our last visit reviewed. Allergies and medications reviewed and updated. Outpatient Medications Prior to Visit  Medication Sig Dispense Refill   acetaminophen (TYLENOL) 500 MG tablet Take 500 mg by mouth every 6 (six) hours as needed.     Cholecalciferol (VITAMIN D) 50 MCG (2000 UT) CAPS Take by mouth.     Continuous Glucose Sensor (FREESTYLE LIBRE 3 SENSOR) MISC Place 1 sensor on the skin every 14 days. Use to check glucose continuously 2 each 11   glucose monitoring kit (FREESTYLE) monitoring kit 1 each by Does not apply route as needed for other. 1 each 0  ketorolac (ACULAR) 0.5 % ophthalmic solution Place 1 drop into the left eye 4 (four) times daily. 5 mL 0   linaclotide (LINZESS) 145 MCG CAPS capsule Take 1 capsule (145 mcg total) by mouth daily before breakfast. 30 capsule 11   omeprazole (PRILOSEC) 20 MG capsule Take 1 capsule (20 mg total) by mouth daily. 30 capsule 3   ondansetron (ZOFRAN) 4 MG tablet Take 1 tablet (4 mg total) by mouth every 8 (eight) hours as needed for nausea or vomiting. 20 tablet 0   Semaglutide,0.25 or 0.5MG /DOS, (OZEMPIC, 0.25 OR 0.5 MG/DOSE,) 2 MG/3ML SOPN Inject 0.5 mg into the skin once a week. VIA NOVO CARES PAP 3 mL 0   valsartan-hydrochlorothiazide (DIOVAN-HCT) 320-25 MG tablet Take 1 tablet by mouth daily. 30 tablet 11    No facility-administered medications prior to visit.     Per HPI unless specifically indicated in ROS section below Review of Systems  Constitutional:  Negative for fatigue and fever.  HENT:  Negative for congestion.   Eyes:  Negative for pain.  Respiratory:  Negative for cough and shortness of breath.   Cardiovascular:  Negative for chest pain, palpitations and leg swelling.  Gastrointestinal:  Negative for abdominal pain.  Genitourinary:  Negative for dysuria and vaginal bleeding.  Musculoskeletal:  Negative for back pain.  Neurological:  Negative for syncope, light-headedness and headaches.  Psychiatric/Behavioral:  Negative for dysphoric mood.    Objective:  BP (!) 140/74   Pulse 73   Temp 98 F (36.7 C) (Temporal)   Ht 5\' 1"  (1.549 m)   Wt 168 lb 4 oz (76.3 kg)   SpO2 100%   BMI 31.79 kg/m   Wt Readings from Last 3 Encounters:  06/16/22 168 lb 4 oz (76.3 kg)  06/08/22 165 lb 4 oz (75 kg)  06/02/22 170 lb 2 oz (77.2 kg)      Physical Exam Constitutional:      General: She is not in acute distress.    Appearance: Normal appearance. She is well-developed. She is not ill-appearing or toxic-appearing.  HENT:     Head: Normocephalic.     Right Ear: Hearing, tympanic membrane, ear canal and external ear normal. Tympanic membrane is not erythematous, retracted or bulging.     Left Ear: Hearing, tympanic membrane, ear canal and external ear normal. Tympanic membrane is not erythematous, retracted or bulging.     Nose: No mucosal edema or rhinorrhea.     Right Sinus: No maxillary sinus tenderness or frontal sinus tenderness.     Left Sinus: No maxillary sinus tenderness or frontal sinus tenderness.     Mouth/Throat:     Pharynx: Uvula midline.  Eyes:     General: Lids are normal. Lids are everted, no foreign bodies appreciated.     Conjunctiva/sclera: Conjunctivae normal.     Pupils: Pupils are equal, round, and reactive to light.  Neck:     Thyroid: No thyroid mass  or thyromegaly.     Vascular: No carotid bruit.     Trachea: Trachea normal.  Cardiovascular:     Rate and Rhythm: Normal rate and regular rhythm.     Pulses: Normal pulses.     Heart sounds: Normal heart sounds, S1 normal and S2 normal. No murmur heard.    No friction rub. No gallop.  Pulmonary:     Effort: Pulmonary effort is normal. No tachypnea or respiratory distress.     Breath sounds: Normal breath sounds. No decreased breath sounds, wheezing, rhonchi or  rales.  Abdominal:     General: Bowel sounds are normal.     Palpations: Abdomen is soft.     Tenderness: There is no abdominal tenderness.  Musculoskeletal:     Cervical back: Normal range of motion and neck supple.  Skin:    General: Skin is warm and dry.     Findings: No rash.  Neurological:     Mental Status: She is alert.  Psychiatric:        Mood and Affect: Mood is not anxious or depressed.        Speech: Speech normal.        Behavior: Behavior normal. Behavior is cooperative.        Thought Content: Thought content normal.        Judgment: Judgment normal.       Results for orders placed or performed during the hospital encounter of 06/14/22  C-reactive protein  Result Value Ref Range   CRP 0.7 <1.0 mg/dL  Sedimentation rate  Result Value Ref Range   Sed Rate 34 (H) 0 - 30 mm/hr    Assessment and Plan  Type 2 diabetes mellitus with neurological complications (HCC) Assessment & Plan: Chronic, good control   She would like to increase this medication to reduce the time her blood sugars above 180 after meals and to assist with continued weight loss.  Semaglutide 0.5 mg weekly Getting through [employer] PAP.Marland Kitchen  Has now received medication.    Neuropathy due to type 2 diabetes mellitus (HCC) Assessment & Plan: Chronic, stable control   Trial of ALA 600 mg daily   Hypertension associated with diabetes (HCC) Assessment & Plan: Chronic, inadequate control.  Blood pressure had seem to be better  controlled on losartan and she is much more consistent now with taking the medication at the same time each day.  We will D/C valsartan and hydrochlorothiazide and change to a different combination medication including amlodipine since she responded so well to this in the past. She does say she always has side effects to blood pressure medication but not with 1 more medicine compared to others.  Will start amlodipine 5 mg / 20 mg of benazepril.  She will follow-up in 1 7 to 10 days for reevaluation of blood pressure.  She will call if her blood pressure does not seem to be responding at home and we will increase to the max dose of this medication.  Return precautions   Other orders -     amLODIPine Besy-Benazepril HCl; Take 1 capsule by mouth daily.  Dispense: 30 capsule; Refill: 11      Return in about 1 week (around 06/23/2022) for  HTN follow up.   Kerby Nora, MD

## 2022-06-21 DIAGNOSIS — H2511 Age-related nuclear cataract, right eye: Secondary | ICD-10-CM | POA: Diagnosis not present

## 2022-06-21 DIAGNOSIS — E113511 Type 2 diabetes mellitus with proliferative diabetic retinopathy with macular edema, right eye: Secondary | ICD-10-CM | POA: Diagnosis not present

## 2022-06-22 ENCOUNTER — Telehealth: Payer: Self-pay

## 2022-06-22 NOTE — Progress Notes (Signed)
Care Management & Coordination Services Pharmacy Team  Reason for Encounter: Appointment Reminder  Contacted patient to confirm telephone appointment with Al Corpus , PharmD on 07/10/22 at 11:45.  Resched. From 5/28 Spoke with patient on 06/22/2022   Do you have any problems getting your medications? No  What is your top health concern you would like to discuss at your upcoming visit? Patient is tolerating ozempic well. Just changed HTN medications and will keep BP log   Have you seen any other providers since your last visit with PCP? No   Hospital visits:  06/07/22-[facility] [place] - facial pain- discharged home    Star Rating Drugs:  Medication:  Last Fill: Day Supply No star medications identified  Ozempic  manufacturer  Care Gaps: Annual wellness visit in last year? Yes  If Diabetic: Last eye exam / retinopathy screening:2022 Last diabetic foot exam:UTD  Al Corpus, PharmD notified   Burt Knack, [facility] Clinical Pharmacy Assistant [phone]

## 2022-06-22 NOTE — Progress Notes (Deleted)
Care Management & Coordination Services Pharmacy Team  Reason for Encounter: Appointment Reminder  Contacted patient to confirm telephone appointment with Al Corpus , PharmD on 06/27/22 at 3:45. Unsuccessful outreach. Left voicemail for patient to return call.  Have you seen any other providers since your last visit with PCP? No    Hospital visits:  5/8/[name]- facial pain- discharged home   Star Rating Drugs:  Medication:  Last Fill: Day Supply No star medications identified  Ozempic   manufacturer  Care Gaps: Annual wellness visit in last year? Yes  If Diabetic: Last eye exam / retinopathy screening:2022 Last diabetic foot exam: UTD   Al Corpus, PharmD notified  Burt Knack, [facility] Clinical Pharmacy Assistant [phone]

## 2022-06-27 ENCOUNTER — Encounter: Payer: Self-pay | Admitting: Ophthalmology

## 2022-06-27 ENCOUNTER — Encounter: Payer: PPO | Admitting: Pharmacist

## 2022-06-27 NOTE — Anesthesia Preprocedure Evaluation (Addendum)
Anesthesia Evaluation  Patient identified by MRN, date of birth, ID band Patient awake    Reviewed: Allergy & Precautions, H&P , NPO status , Patient's Chart, lab work & pertinent test results  Airway Mallampati: III  TM Distance: <3 FB Neck ROM: Full    Dental no notable dental hx. (+) Caps   Pulmonary neg pulmonary ROS   Pulmonary exam normal breath sounds clear to auscultation       Cardiovascular hypertension, Normal cardiovascular exam+ Valvular Problems/Murmurs  Rhythm:Regular Rate:Normal  Echo 11-12-19 EF 50-65%, grade I diastolic dysfunction   Neuro/Psych  PSYCHIATRIC DISORDERS Anxiety      Neuromuscular disease  negative psych ROS   GI/Hepatic Neg liver ROS,GERD  ,,  Endo/Other  diabetes    Renal/GU negative Renal ROS  negative genitourinary   Musculoskeletal  (+) Arthritis ,    Abdominal   Peds negative pediatric ROS (+)  Hematology negative hematology ROS (+)   Anesthesia Other Findings Type II or unspecified Dm  Tibialis tendinitis  Obesity Unspecified essential hypertension Undiagnosed cardiac murmurs Allergic genetic state Glaucoma  Tubular adenoma of colon Neuropathy  Arthritis    Reproductive/Obstetrics negative OB ROS                             Anesthesia Physical Anesthesia Plan  ASA: 3  Anesthesia Plan: MAC   Post-op Pain Management:    Induction: Intravenous  PONV Risk Score and Plan:   Airway Management Planned: Natural Airway and Nasal Cannula  Additional Equipment:   Intra-op Plan:   Post-operative Plan:   Informed Consent: I have reviewed the patients History and Physical, chart, labs and discussed the procedure including the risks, benefits and alternatives for the proposed anesthesia with the patient or authorized representative who has indicated his/her understanding and acceptance.     Dental Advisory Given  Plan Discussed with:  Anesthesiologist, CRNA and Surgeon  Anesthesia Plan Comments: (Patient consented for risks of anesthesia including but not limited to:  - adverse reactions to medications - damage to eyes, teeth, lips or other oral mucosa - nerve damage due to positioning  - sore throat or hoarseness - Damage to heart, brain, nerves, lungs, other parts of body or loss of life  Patient voiced understanding.)        Anesthesia Quick Evaluation

## 2022-06-28 DIAGNOSIS — E113511 Type 2 diabetes mellitus with proliferative diabetic retinopathy with macular edema, right eye: Secondary | ICD-10-CM | POA: Diagnosis not present

## 2022-06-29 NOTE — Discharge Instructions (Signed)

## 2022-07-03 ENCOUNTER — Ambulatory Visit: Payer: PPO | Admitting: Anesthesiology

## 2022-07-03 ENCOUNTER — Encounter: Payer: Self-pay | Admitting: Ophthalmology

## 2022-07-03 ENCOUNTER — Other Ambulatory Visit: Payer: Self-pay

## 2022-07-03 ENCOUNTER — Ambulatory Visit
Admission: RE | Admit: 2022-07-03 | Discharge: 2022-07-03 | Disposition: A | Discharge status: Home / Self Care | Payer: PPO | Attending: Ophthalmology | Admitting: Ophthalmology

## 2022-07-03 ENCOUNTER — Encounter
Admission: RE | Disposition: A | Discharge status: Home / Self Care | Payer: Self-pay | Source: Home / Self Care | Attending: Ophthalmology

## 2022-07-03 DIAGNOSIS — E113511 Type 2 diabetes mellitus with proliferative diabetic retinopathy with macular edema, right eye: Secondary | ICD-10-CM | POA: Diagnosis not present

## 2022-07-03 DIAGNOSIS — M1990 Unspecified osteoarthritis, unspecified site: Secondary | ICD-10-CM | POA: Insufficient documentation

## 2022-07-03 DIAGNOSIS — K219 Gastro-esophageal reflux disease without esophagitis: Secondary | ICD-10-CM | POA: Diagnosis not present

## 2022-07-03 DIAGNOSIS — Z7985 Long-term (current) use of injectable non-insulin antidiabetic drugs: Secondary | ICD-10-CM | POA: Diagnosis not present

## 2022-07-03 DIAGNOSIS — E1136 Type 2 diabetes mellitus with diabetic cataract: Secondary | ICD-10-CM | POA: Insufficient documentation

## 2022-07-03 DIAGNOSIS — E11311 Type 2 diabetes mellitus with unspecified diabetic retinopathy with macular edema: Secondary | ICD-10-CM | POA: Diagnosis not present

## 2022-07-03 DIAGNOSIS — G709 Myoneural disorder, unspecified: Secondary | ICD-10-CM | POA: Diagnosis not present

## 2022-07-03 DIAGNOSIS — H2511 Age-related nuclear cataract, right eye: Secondary | ICD-10-CM | POA: Diagnosis not present

## 2022-07-03 DIAGNOSIS — I10 Essential (primary) hypertension: Secondary | ICD-10-CM | POA: Diagnosis not present

## 2022-07-03 HISTORY — DX: Other ill-defined heart diseases: I51.89

## 2022-07-03 HISTORY — PX: CATARACT EXTRACTION W/PHACO: SHX586

## 2022-07-03 LAB — GLUCOSE, CAPILLARY: Glucose-Capillary: 95 mg/dL (ref 70–99)

## 2022-07-03 SURGERY — PHACOEMULSIFICATION, CATARACT, WITH IOL INSERTION
Anesthesia: Monitor Anesthesia Care | Site: Eye | Laterality: Right

## 2022-07-03 MED ORDER — FENTANYL CITRATE (PF) 100 MCG/2ML IJ SOLN
INTRAMUSCULAR | Status: DC | PRN
  Administered 2022-07-03: 50 ug via INTRAVENOUS

## 2022-07-03 MED ORDER — ARMC OPHTHALMIC DILATING DROPS
1.0000 | OPHTHALMIC | Status: DC | PRN
  Administered 2022-07-03 (×3): 1 via OPHTHALMIC

## 2022-07-03 MED ORDER — SIGHTPATH DOSE#1 BSS IO SOLN
INTRAOCULAR | Status: DC | PRN
  Administered 2022-07-03: 87 mL via OPHTHALMIC

## 2022-07-03 MED ORDER — TETRACAINE HCL 0.5 % OP SOLN
1.0000 [drp] | OPHTHALMIC | Status: DC | PRN
  Administered 2022-07-03 (×3): 1 [drp] via OPHTHALMIC

## 2022-07-03 MED ORDER — LACTATED RINGERS IV SOLN: INTRAVENOUS | Status: DC

## 2022-07-03 MED ORDER — SIGHTPATH DOSE#1 BSS IO SOLN
INTRAOCULAR | Status: DC | PRN
  Administered 2022-07-03: 15 mL

## 2022-07-03 MED ORDER — SIGHTPATH DOSE#1 NA HYALUR & NA CHOND-NA HYALUR IO KIT
PACK | INTRAOCULAR | Status: DC | PRN
  Administered 2022-07-03: 1 via OPHTHALMIC

## 2022-07-03 MED ORDER — TRIAMCINOLONE ACETONIDE 40 MG/ML IJ SUSP
INTRAMUSCULAR | Status: DC | PRN
  Administered 2022-07-03: .1 mL via INTRAMUSCULAR

## 2022-07-03 MED ORDER — MIDAZOLAM HCL 2 MG/2ML IJ SOLN
INTRAMUSCULAR | Status: DC | PRN
  Administered 2022-07-03: 2 mg via INTRAVENOUS

## 2022-07-03 MED ORDER — LIDOCAINE HCL (PF) 2 % IJ SOLN
INTRAOCULAR | Status: DC | PRN
  Administered 2022-07-03: 1 mL via INTRAOCULAR

## 2022-07-03 MED ORDER — MOXIFLOXACIN HCL 0.5 % OP SOLN
OPHTHALMIC | Status: DC | PRN
  Administered 2022-07-03: .2 mL via OPHTHALMIC

## 2022-07-03 SURGICAL SUPPLY — 11 items
CATARACT SUITE SIGHTPATH (MISCELLANEOUS) ×1 IMPLANT
DISSECTOR HYDRO NUCLEUS 50X22 (MISCELLANEOUS) ×1 IMPLANT
FEE CATARACT SUITE SIGHTPATH (MISCELLANEOUS) ×1 IMPLANT
GLOVE SURG GAMMEX PI TX LF 7.5 (GLOVE) ×1 IMPLANT
GLOVE SURG SYN 8.5 E (GLOVE) ×1 IMPLANT
GLOVE SURG SYN 8.5 PF PI (GLOVE) ×1 IMPLANT
LENS IOL TECNIS EYHANCE 21.5 (Intraocular Lens) IMPLANT
NDL FILTER BLUNT 18X1 1/2 (NEEDLE) ×1 IMPLANT
NEEDLE FILTER BLUNT 18X1 1/2 (NEEDLE) ×1 IMPLANT
SYR 3ML LL SCALE MARK (SYRINGE) ×1 IMPLANT
SYR 5ML LL (SYRINGE) ×1 IMPLANT

## 2022-07-03 NOTE — Transfer of Care (Signed)
Immediate Anesthesia Transfer of Care Note  Patient: Patricia Horne  Procedure(s) Performed: CATARACT EXTRACTION PHACO AND INTRAOCULAR LENS PLACEMENT (IOC) RIGHT INTRAVITREAL KENALOG INJECTION DIABETIC  5.53  00:46.1 (Right: Eye)  Patient Location: PACU  Anesthesia Type: MAC  Level of Consciousness: awake, alert  and patient cooperative  Airway and Oxygen Therapy: Patient Spontanous Breathing and Patient connected to supplemental oxygen  Post-op Assessment: Post-op Vital signs reviewed, Patient's Cardiovascular Status Stable, Respiratory Function Stable, Patent Airway and No signs of Nausea or vomiting  Post-op Vital Signs: Reviewed and stable  Complications: No notable events documented.

## 2022-07-03 NOTE — H&P (Signed)
[facility]   Primary Care Physician:  Excell Seltzer, MD Ophthalmologist: Dr. Willey Blade  Pre-Procedure History & Physical: HPI:  Patricia Horne is a 72 y.o. female here for cataract surgery.   Past Medical History:  Diagnosis Date   Abdominal pain, left lower quadrant    Acute upper respiratory infections of unspecified site    Allergic genetic state    Arthritis    in arms   Glaucoma    Grade I diastolic dysfunction    Lipoma of unspecified site    Neuropathy    Obesity    Other and unspecified noninfectious gastroenteritis and colitis(558.9)    Other screening mammogram    Routine general medical examination at a health care facility    Screening for lipoid disorders    Special screening for osteoporosis    Tibialis tendinitis    Tubular adenoma of colon 03/31/2014   Type II or unspecified type diabetes mellitus without mention of complication, not stated as uncontrolled    Undiagnosed cardiac murmurs    Unspecified essential hypertension     Past Surgical History:  Procedure Laterality Date   CATARACT EXTRACTION W/PHACO Left 09/29/2019   Procedure: CATARACT EXTRACTION PHACO AND INTRAOCULAR LENS PLACEMENT (IOC) LEFT DIABETIC 3.42 00:32.9;  Surgeon: Nevada Crane, MD;  Location: [facility];  Service: Ophthalmology;  Laterality: Left;   COLONOSCOPY  04/01/2004   COLONOSCOPY  03/31/2014   COLONOSCOPY WITH PROPOFOL N/A 01/01/2018   Procedure: COLONOSCOPY WITH PROPOFOL;  Surgeon: Christena Deem, MD;  Location: [facility] ENDOSCOPY;  Service: Endoscopy;  Laterality: N/A;   LIPOMA EXCISION Left 2010   arm   TOE SURGERY  1990's   bunion, hammer toe repair   TOTAL ABDOMINAL HYSTERECTOMY  1983   one ovary removed, later other ovary removed    Prior to Admission medications   Medication Sig Start Date End Date Taking? Authorizing Provider  acetaminophen (TYLENOL) 500 MG tablet Take 500 mg by mouth every 6 (six) hours as needed.   Yes [provider]  amLODipine-benazepril (LOTREL) 5-20 MG capsule Take 1 capsule by mouth daily. 06/16/22  Yes Bedsole, Amy E, MD  Cholecalciferol (VITAMIN D) 50 MCG (2000 UT) CAPS Take by mouth.   Yes [provider]  omeprazole (PRILOSEC) 20 MG capsule Take 1 capsule (20 mg total) by mouth daily. 06/28/21  Yes Bedsole, Amy E, MD  ondansetron (ZOFRAN) 4 MG tablet Take 1 tablet (4 mg total) by mouth every 8 (eight) hours as needed for nausea or vomiting. 06/08/22  Yes Bedsole, Amy E, MD  Semaglutide,0.25 or 0.5MG /DOS, (OZEMPIC, 0.25 OR 0.5 MG/DOSE,) 2 MG/3ML SOPN Inject 0.5 mg into the skin once a week. VIA NOVO CARES PAP 06/02/22  Yes Bedsole, Amy E, MD  Continuous Glucose Sensor (FREESTYLE LIBRE 3 SENSOR) MISC Place 1 sensor on the skin every 14 days. Use to check glucose continuously 05/30/22   Bedsole, Amy E, MD  glucose monitoring kit (FREESTYLE) monitoring kit 1 each by Does not apply route as needed for other. 10/29/18   Bedsole, Amy E, MD  ketorolac (ACULAR) 0.5 % ophthalmic solution Place 1 drop into the left eye 4 (four) times daily. 06/07/22     linaclotide (LINZESS) 145 MCG CAPS capsule Take 1 capsule (145 mcg total) by mouth daily before breakfast. Patient not taking: Reported on 06/27/2022 10/14/21   Excell Seltzer, MD    Allergies as of 06/20/2022 - Review Complete 06/07/2022  Allergen Reaction Noted  Amoxicillin Diarrhea 11/29/2020    Family History  Problem Relation Age of Onset   Diabetes Mother    Arthritis Father    Diabetes Brother        type 1   Ulcerative colitis Paternal Grandmother    Colon polyps Paternal Grandfather    Breast cancer Neg Hx     Social History   Socioeconomic History   Marital status: Single    Spouse name: Not on file   Number of children: 0   Years of education: Not on file   Highest education level: Not on file  Occupational History   Occupation: Geologist, engineering, Passenger transport manager at [employer]  Tobacco Use   Smoking status: Never   Smokeless  tobacco: Never  Vaping Use   Vaping Use: Never used  Substance and Sexual Activity   Alcohol use: No    Alcohol/week: 0.0 standard drinks of alcohol   Drug use: No   Sexual activity: Not Currently  Other Topics Concern   Not on file  Social History Narrative   Caffeine use: soda/Pepsi   Fast food.  Regular exercise at work.   No sex.   Social Determinants of Health   Financial Resource Strain: Low Risk  (05/17/2022)   Overall Financial Resource Strain (CARDIA)    Difficulty of Paying Living Expenses: Not hard at all  Food Insecurity: No Food Insecurity (05/17/2022)   Hunger Vital Sign    Worried About Running Out of Food in the Last Year: Never true    Ran Out of Food in the Last Year: Never true  Transportation Needs: No Transportation Needs (05/17/2022)   PRAPARE - Administrator, Civil Service (Medical): No    Lack of Transportation (Non-Medical): No  Physical Activity: Inactive (05/17/2022)   Exercise Vital Sign    Days of Exercise per Week: 0 days    Minutes of Exercise per Session: 0 min  Stress: No Stress Concern Present (05/17/2022)   [employer] of Occupational Health - Occupational Stress Questionnaire    Feeling of Stress : Only a little  Social Connections: Socially Integrated (05/17/2022)   Social Connection and Isolation Panel [NHANES]    Frequency of Communication with Friends and Family: More than three times a week    Frequency of Social Gatherings with Friends and Family: More than three times a week    Attends Religious Services: 1 to 4 times per year    Active Member of [employer] or Organizations: Yes    Attends Engineer, structural: More than 4 times per year    Marital Status: Married  Catering manager Violence: Not At Risk (05/17/2022)   Humiliation, Afraid, Rape, and Kick questionnaire    Fear of Current or Ex-Partner: No    Emotionally Abused: No    Physically Abused: No    Sexually Abused: No    Review of Systems: See HPI,  otherwise negative ROS  Physical Exam: BP (!) 165/71   Pulse 83   Temp (!) 97.3 F (36.3 C) (Temporal)   Resp 15   Ht 5\' 1"  (1.549 m)   Wt 77 kg   SpO2 99%   BMI 32.08 kg/m  General:   Alert, cooperative in NAD Head:  Normocephalic and atraumatic. Respiratory:  Normal work of breathing. Cardiovascular:  RRR  Impression/Plan: Patricia Horne is here for cataract surgery.  Risks, benefits, limitations, and alternatives regarding cataract surgery have been reviewed with the patient.  Questions have been answered.  All parties  agreeable.   Willey Blade, MD  07/03/2022, 8:27 AM

## 2022-07-03 NOTE — Op Note (Signed)
OPERATIVE NOTE  Patricia Horne [id number] 07/03/2022   PREOPERATIVE DIAGNOSIS:    Nuclear sclerotic cataract right eye.  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     PROCEDURE:    CPT [phone] Phacoemusification with posterior chamber intraocular lens placement of the right eye  CPT 67028 Intravitreal injection of kenalog, right eye.  LENS:   Implant Name Type Inv. Item Serial No. Manufacturer Lot No. LRB No. Used Action  LENS IOL TECNIS EYHANCE 21.5 - [id number] Intraocular Lens LENS IOL TECNIS EYHANCE 21.5 [id number] SIGHTPATH  Right 1 Implanted       Procedure(s) with comments: CATARACT EXTRACTION PHACO AND INTRAOCULAR LENS PLACEMENT (IOC) RIGHT INTRAVITREAL KENALOG INJECTION DIABETIC  5.53  00:46.1 (Right) - Diabetic  DIB00 +21.5   ULTRASOUND TIME: 0 minutes 46 seconds.  CDE 5.53   SURGEON:  Willey Blade, MD, MPH  ANESTHESIOLOGIST: Anesthesiologist: Marisue Humble, MD CRNA: Andee Poles, CRNA; Domenic Moras, CRNA   ANESTHESIA:  Topical with tetracaine drops augmented with 1% preservative-free intracameral lidocaine.  ESTIMATED BLOOD LOSS: less than 1 mL.   COMPLICATIONS:  None.   DESCRIPTION OF PROCEDURE:  The patient was identified in the holding room and transported to the operating room and placed in the supine position under the operating microscope.  The right eye was identified as the operative eye and it was prepped and draped in the usual sterile ophthalmic fashion.   A 1.0 millimeter clear-corneal paracentesis was made at the 10:30 position. 0.5 ml of preservative-free 1% lidocaine with epinephrine was injected into the anterior chamber.  The anterior chamber was filled with viscoelastic.  A 2.4 millimeter keratome was used to make a near-clear corneal incision at the 8:00 position.  A curvilinear capsulorrhexis was made with a cystotome and capsulorrhexis  forceps.  Balanced salt solution was used to hydrodissect and hydrodelineate the nucleus.   Phacoemulsification was then used in stop and chop fashion to remove the lens nucleus and epinucleus.  The remaining cortex was then removed using the irrigation and aspiration handpiece. Viscoelastic was then placed into the capsular bag to distend it for lens placement.  A lens was then injected into the capsular bag.  The remaining viscoelastic was aspirated.   Wounds were hydrated with balanced salt solution.  The anterior chamber was inflated to a physiologic pressure with balanced salt solution.   Intracameral vigamox 0.1 mL undiluted was injected into the eye and a drop placed onto the ocular surface.   0.1 mL of Kenalog 40 mg/mL was injected into the vitreous cavity 3.5 mm posterior to the limbus in the superotemporal quadrant.   No wound leaks were noted.  The patient was taken to the recovery room in stable condition without complications of anesthesia or surgery  Willey Blade 07/03/2022, 8:58 AM

## 2022-07-03 NOTE — Anesthesia Postprocedure Evaluation (Signed)
Anesthesia Post Note  Patient: Patricia Horne  Procedure(s) Performed: CATARACT EXTRACTION PHACO AND INTRAOCULAR LENS PLACEMENT (IOC) RIGHT INTRAVITREAL KENALOG INJECTION DIABETIC  5.53  00:46.1 (Right: Eye)  Patient location during evaluation: PACU Anesthesia Type: MAC Level of consciousness: awake and alert Pain management: pain level controlled Vital Signs Assessment: post-procedure vital signs reviewed and stable Respiratory status: spontaneous breathing, nonlabored ventilation, respiratory function stable and patient connected to nasal cannula oxygen Cardiovascular status: stable and blood pressure returned to baseline Postop Assessment: no apparent nausea or vomiting Anesthetic complications: no   No notable events documented.   Last Vitals:  Vitals:   07/03/22 0900 07/03/22 0905  BP: (!) 160/76 (!) 165/84  Pulse: 80 80  Resp: 12 16  Temp: 36.6 C 36.6 C  SpO2: 99% 100%    Last Pain:  Vitals:   07/03/22 0905  TempSrc:   PainSc: 0-No pain                 Marisue Humble

## 2022-07-04 ENCOUNTER — Encounter: Payer: Self-pay | Admitting: Ophthalmology

## 2022-07-05 ENCOUNTER — Telehealth: Payer: Self-pay

## 2022-07-05 NOTE — Progress Notes (Signed)
Care Management & Coordination Services Pharmacy Team  Reason for Encounter: Appointment Reminder  Contacted patient to confirm telephone appointment with Al Corpus , PharmD on 07/10/22 at 11:45. Unsuccessful outreach. Left voicemail for patient to return call.   Have you seen any other providers since your last visit with PCP? Yes  Ophthalmology   Hospital visits:  07/03/22- [facility]- cataract extraction  06/07/22- [facility]- facial pain  - no admission   Star Rating Drugs:  Medication:  Last Fill: Day Supply Ozempic   Manufacturer   Care Gaps: Annual wellness visit in last year? Yes  If Diabetic: Last eye exam / retinopathy screening: 2022 Last diabetic foot exam:UTD   Al Corpus, PharmD notified  Burt Knack, [facility] Clinical Pharmacy Assistant [phone]

## 2022-07-07 ENCOUNTER — Ambulatory Visit (INDEPENDENT_AMBULATORY_CARE_PROVIDER_SITE_OTHER): Payer: PPO | Admitting: Family Medicine

## 2022-07-07 ENCOUNTER — Encounter: Payer: Self-pay | Admitting: Family Medicine

## 2022-07-07 VITALS — BP 175/86 | HR 76 | Temp 98.5°F | Resp 16 | Ht 61.0 in | Wt 167.1 lb

## 2022-07-07 DIAGNOSIS — Z7985 Long-term (current) use of injectable non-insulin antidiabetic drugs: Secondary | ICD-10-CM

## 2022-07-07 DIAGNOSIS — E1159 Type 2 diabetes mellitus with other circulatory complications: Secondary | ICD-10-CM

## 2022-07-07 DIAGNOSIS — E1149 Type 2 diabetes mellitus with other diabetic neurological complication: Secondary | ICD-10-CM | POA: Diagnosis not present

## 2022-07-07 DIAGNOSIS — I152 Hypertension secondary to endocrine disorders: Secondary | ICD-10-CM

## 2022-07-07 MED ORDER — AMLODIPINE BESY-BENAZEPRIL HCL 10-40 MG PO CAPS: 1.0000 | ORAL_CAPSULE | Freq: Every day | ORAL | Status: DC

## 2022-07-07 NOTE — Assessment & Plan Note (Addendum)
Chronic, excellent control per continuous blood glucose monitor.  Time in range 97 to 98% over the last month.  Estimated average A1c 6.2  Lab Results  Component Value Date   HGBA1C 6.7 (A) 04/21/2022

## 2022-07-07 NOTE — Progress Notes (Signed)
Patient ID: Patricia Horne, female    DOB: 03/25/1950, 72 y.o.   MRN: [id number]  This visit was conducted in person.  BP (!) 175/86   Pulse 76   Temp 98.5 F (36.9 C)   Resp 16   Ht 5\' 1"  (1.549 m)   Wt 167 lb 2 oz (75.8 kg)   SpO2 98%   BMI 31.58 kg/m    CC:  Chief Complaint  Patient presents with   Hypertension    Subjective:   HPI: Patricia Horne is a 72 y.o. female presenting on 07/07/2022 for Hypertension   Hypertension:   Some improvement with change from valsartan HCTZ to amlodipine /benazepril 5/20 mg daily BP Readings from Last 3 Encounters:  07/07/22 (!) 175/86  07/03/22 (!) 165/84  06/16/22 (!) 140/74  Using medication without problems or lightheadedness:  none Chest pain with exertion:none Edema: stable Short of breath:none Average home BPs: Improved compared to previous.. 166/78. Other issues: she is having some cough associated with it.  Cataract extraction on 07/03/2022     Relevant past medical, surgical, family and social history reviewed and updated as indicated. Interim medical history since our last visit reviewed. Allergies and medications reviewed and updated. Outpatient Medications Prior to Visit  Medication Sig Dispense Refill   acetaminophen (TYLENOL) 500 MG tablet Take 500 mg by mouth every 6 (six) hours as needed.     Cholecalciferol (VITAMIN D) 50 MCG (2000 UT) CAPS Take by mouth.     Continuous Glucose Sensor (FREESTYLE LIBRE 3 SENSOR) MISC Place 1 sensor on the skin every 14 days. Use to check glucose continuously 2 each 11   glucose monitoring kit (FREESTYLE) monitoring kit 1 each by Does not apply route as needed for other. 1 each 0   ketorolac (ACULAR) 0.5 % ophthalmic solution Place 1 drop into the left eye 4 (four) times daily. 5 mL 0   linaclotide (LINZESS) 145 MCG CAPS capsule Take 1 capsule (145 mcg total) by mouth daily before breakfast. 30 capsule 11   omeprazole (PRILOSEC) 20 MG capsule Take 1 capsule (20 mg total) by  mouth daily. 30 capsule 3   ondansetron (ZOFRAN) 4 MG tablet Take 1 tablet (4 mg total) by mouth every 8 (eight) hours as needed for nausea or vomiting. 20 tablet 0   Semaglutide,0.25 or 0.5MG /DOS, (OZEMPIC, 0.25 OR 0.5 MG/DOSE,) 2 MG/3ML SOPN Inject 0.5 mg into the skin once a week. VIA NOVO CARES PAP 3 mL 0   amLODipine-benazepril (LOTREL) 5-20 MG capsule Take 1 capsule by mouth daily. 30 capsule 11   No facility-administered medications prior to visit.     Per HPI unless specifically indicated in ROS section below Review of Systems  Constitutional:  Negative for fatigue and fever.  HENT:  Negative for congestion.   Eyes:  Negative for pain.  Respiratory:  Negative for cough and shortness of breath.   Cardiovascular:  Negative for chest pain, palpitations and leg swelling.  Gastrointestinal:  Negative for abdominal pain.  Genitourinary:  Negative for dysuria and vaginal bleeding.  Musculoskeletal:  Negative for back pain.  Neurological:  Negative for syncope, light-headedness and headaches.  Psychiatric/Behavioral:  Negative for dysphoric mood.    Objective:  BP (!) 175/86   Pulse 76   Temp 98.5 F (36.9 C)   Resp 16   Ht 5\' 1"  (1.549 m)   Wt 167 lb 2 oz (75.8 kg)   SpO2 98%   BMI 31.58 kg/m   Wt  Readings from Last 3 Encounters:  07/07/22 167 lb 2 oz (75.8 kg)  07/03/22 169 lb 12.8 oz (77 kg)  06/16/22 168 lb 4 oz (76.3 kg)      Physical Exam Constitutional:      General: She is not in acute distress.    Appearance: Normal appearance. She is well-developed. She is not ill-appearing or toxic-appearing.  HENT:     Head: Normocephalic.     Right Ear: Hearing, tympanic membrane, ear canal and external ear normal. Tympanic membrane is not erythematous, retracted or bulging.     Left Ear: Hearing, tympanic membrane, ear canal and external ear normal. Tympanic membrane is not erythematous, retracted or bulging.     Nose: No mucosal edema or rhinorrhea.     Right Sinus: No  maxillary sinus tenderness or frontal sinus tenderness.     Left Sinus: No maxillary sinus tenderness or frontal sinus tenderness.     Mouth/Throat:     Pharynx: Uvula midline.  Eyes:     General: Lids are normal. Lids are everted, no foreign bodies appreciated.     Conjunctiva/sclera: Conjunctivae normal.     Pupils: Pupils are equal, round, and reactive to light.  Neck:     Thyroid: No thyroid mass or thyromegaly.     Vascular: No carotid bruit.     Trachea: Trachea normal.  Cardiovascular:     Rate and Rhythm: Normal rate and regular rhythm.     Pulses: Normal pulses.     Heart sounds: Normal heart sounds, S1 normal and S2 normal. No murmur heard.    No friction rub. No gallop.  Pulmonary:     Effort: Pulmonary effort is normal. No tachypnea or respiratory distress.     Breath sounds: Normal breath sounds. No decreased breath sounds, wheezing, rhonchi or rales.  Abdominal:     General: Bowel sounds are normal.     Palpations: Abdomen is soft.     Tenderness: There is no abdominal tenderness.  Musculoskeletal:     Cervical back: Normal range of motion and neck supple.  Skin:    General: Skin is warm and dry.     Findings: No rash.  Neurological:     Mental Status: She is alert.  Psychiatric:        Mood and Affect: Mood is not anxious or depressed.        Speech: Speech normal.        Behavior: Behavior normal. Behavior is cooperative.        Thought Content: Thought content normal.        Judgment: Judgment normal.       Results for orders placed or performed during the hospital encounter of 07/03/22  Glucose, capillary  Result Value Ref Range   Glucose-Capillary 95 70 - 99 mg/dL    Assessment and Plan  Hypertension associated with diabetes [facility]) Assessment & Plan: Chronic, inadequate but improving control with changed to amlodipine/benazepril 5/20 mg daily.  She does have some associated dry cough but it is tolerable.  We will increase amlodipine/benazepril to  10/40 mg daily.  Follow blood pressure at home with goal at less than 150/90, ideally less than 140/90. She will follow-up in 2 weeks for blood pressure reevaluation.   Type 2 diabetes mellitus with neurological complications [facility]) Assessment & Plan: Chronic, excellent control per continuous blood glucose monitor.  Time in range 97 to 98% over the last month.  Estimated average A1c 6.2   Other orders -  amLODIPine Besy-Benazepril HCl; Take 1 capsule by mouth daily.    Return in about 2 weeks (around 07/21/2022) for  follow up  HTN, DM with POC A1C.   Kerby Nora, MD

## 2022-07-07 NOTE — Patient Instructions (Signed)
Increase amlodipine benazepril to 10/40 mg daily.   Follow BP at home.Marland Kitchen goal < 150/90.

## 2022-07-07 NOTE — Assessment & Plan Note (Signed)
Chronic, inadequate but improving control with changed to amlodipine/benazepril 5/20 mg daily.  She does have some associated dry cough but it is tolerable.  We will increase amlodipine/benazepril to 10/40 mg daily.  Follow blood pressure at home with goal at less than 150/90, ideally less than 140/90. She will follow-up in 2 weeks for blood pressure reevaluation.

## 2022-07-10 ENCOUNTER — Ambulatory Visit: Payer: PPO | Admitting: Pharmacist

## 2022-07-10 NOTE — Progress Notes (Signed)
Care Management & Coordination Services Pharmacy Note  07/10/2022 Name:  Patricia Horne MRN:  [id number] DOB:  10-21-1950  Summary: F/U visit -HTN: BP was 136/78 this morning, then 155/60 a few hrs later; pt is compliant with amlodipine-benazepril -DM: A1c 6.7% (03/2022) at goal on Ozempic 0.25 mg weekly; she uses Libre 3 CGM and reports multiple low alarms since changing sensor on Sat June 8th, denies s/sx of hypoglycemia so most likely the sensor is malfunctioning Reviewed AGP report: 06/27/22 to 07/10/22. Sensor active: 97%  Time in range (70-180): 98% (goal > 70%)  High (180-250): 0%  Very high (>250): 2%  Low (< 70): 0% (goal < 4%)  GMI: 6.1%; Average glucose: 116  Recommendations/Changes made from today's visit: -Advised to take BP medication the same time each day -Advised pt to contact [employer] to troubleshoot/replace faulty sensor  Follow up plan: -Pharmacist follow up televisit scheduled for 1 month -PCP appt 07/25/22    Subjective: Patricia Horne is an 72 y.o. year old female who is a primary patient of Bedsole, Amy E, MD.  The care coordination team was consulted for assistance with disease management and care coordination needs.    Engaged with patient by telephone for follow up visit.  Recent office visits: 07/07/22 Dr Ermalene Searing OV: BP 175/86; increase amlodipine/benazepril to 10-40 mg. Goal < 150/90  06/16/22 Dr Ermalene Searing OV: HTN -BP 140/74. Switch valsartan/hctz to amlodipine/benazepril 5-20 mg.  06/08/22 Dr Ermalene Searing OV: ED f/u (eye pain) - rx tramadol 50 mg, zofran 4 mg. Cr 1.25, mild dehydration in s/o gastroenteritis. Lipase normal.  06/02/22 Dr Ermalene Searing OV: f/u- BP 166/92. Switch valsartan to valsartan/hctz 320-25 mg  05/19/22 Dr Ermalene Searing OV: f/u - BP 180/86; increase valsartan to 320 mg. Issues with cgm  04/21/22 Dr Ermalene Searing OV: A1c 6.7%  Recent consult visits: N/a  Hospital visits: 06/07/22 ED visit [facility]): facial pain, headache. Improved with tylenol. CT negative  for acute issue.   Objective:  Lab Results  Component Value Date   CREATININE 1.25 (H) 06/08/2022   BUN 21 06/08/2022   GFR 43.28 (L) 06/08/2022   GFRNONAA >60 09/30/2012   GFRAA >60 09/30/2012   NA 140 06/08/2022   K 4.3 06/08/2022   CALCIUM 9.3 06/08/2022   CO2 29 06/08/2022   GLUCOSE 95 06/08/2022    Lab Results  Component Value Date/Time   HGBA1C 6.7 (A) 04/21/2022 10:37 AM   HGBA1C 5.7 (A) 12/09/2021 11:30 AM   HGBA1C 7.0 (H) 06/24/2021 09:00 AM   HGBA1C 9.6 (H) 11/30/2020 10:29 AM   HGBA1C 13.9 06/05/2017 12:00 AM   GFR 43.28 (L) 06/08/2022 12:46 PM   GFR 53.99 (L) 05/19/2022 11:09 AM   MICROALBUR <0.7 12/09/2021 12:56 PM   MICROALBUR 1.9 04/13/2016 09:47 AM    Last diabetic Eye exam:  Lab Results  Component Value Date/Time   HMDIABEYEEXA Retinopathy (A) 03/24/2020 12:00 AM    Last diabetic Foot exam:  Lab Results  Component Value Date/Time   HMDIABFOOTEX done 08/12/2021 12:00 AM     Lab Results  Component Value Date   CHOL 163 06/24/2021   HDL 46.60 06/24/2021   LDLCALC 96 06/24/2021   TRIG 99.0 06/24/2021   CHOLHDL 3 06/24/2021       Latest Ref Rng & Units 06/08/2022   12:46 PM 11/04/2021   12:01 PM 08/12/2021    9:51 AM  Hepatic Function  Total Protein 6.0 - 8.3 g/dL 7.3  6.9  7.7   Albumin 3.5 - 5.2 g/dL  3.8  3.6  3.9   AST 0 - 37 U/L 16  16  15    ALT 0 - 35 U/L 10  12  10    Alk Phosphatase 39 - 117 U/L 57  64  64   Total Bilirubin 0.2 - 1.2 mg/dL 1.1  0.6  0.6     Lab Results  Component Value Date/Time   TSH 1.59 08/12/2021 09:51 AM   TSH 1.07 09/23/2019 01:06 PM       Latest Ref Rng & Units 06/08/2022   12:46 PM 11/04/2021   12:01 PM 08/12/2021    9:51 AM  CBC  WBC 4.0 - 10.5 K/uL 7.8  5.5  6.8   Hemoglobin 12.0 - 15.0 g/dL 16.1  09.6  04.5   Hematocrit 36.0 - 46.0 % 40.8  39.5  38.3   Platelets 150.0 - 400.0 K/uL 282.0  269.0  285.0     Lab Results  Component Value Date/Time   VD25OH 54.36 08/12/2021 09:51 AM   VD25OH 9.87 (L)  09/23/2019 01:06 PM   VITAMINB12 279 08/12/2021 09:51 AM   VITAMINB12 258 09/23/2019 01:06 PM    Clinical ASCVD: No  The 10-year ASCVD risk score (Arnett DK, et al., 2019) is: 37.3%   Values used to calculate the score:     Age: 36 years     Sex: Female     Is Non-Hispanic African American: Yes     Diabetic: Yes     Tobacco smoker: No     Systolic Blood Pressure: 175 mmHg     Is BP treated: Yes     HDL Cholesterol: 46.6 mg/dL     Total Cholesterol: 163 mg/dL        05/08/8117    1:47 AM 04/21/2022   10:31 AM 03/30/2021    2:57 PM  Depression screen PHQ 2/9  Decreased Interest 0 1 0  Down, Depressed, Hopeless 1 1 0  PHQ - 2 Score 1 2 0  Altered sleeping 0 3   Tired, decreased energy 0 3   Change in appetite 0 0   Feeling bad or failure about yourself  0 1   Trouble concentrating 0 1   Moving slowly or fidgety/restless 0 0   Suicidal thoughts 0 0   PHQ-9 Score 1 10   Difficult doing work/chores Not difficult at all Somewhat difficult      Social History   Tobacco Use  Smoking Status Never  Smokeless Tobacco Never   BP Readings from Last 3 Encounters:  07/07/22 (!) 175/86  07/03/22 (!) 165/84  06/16/22 (!) 140/74   Pulse Readings from Last 3 Encounters:  07/07/22 76  07/03/22 80  06/16/22 73   Wt Readings from Last 3 Encounters:  07/07/22 167 lb 2 oz (75.8 kg)  07/03/22 169 lb 12.8 oz (77 kg)  06/16/22 168 lb 4 oz (76.3 kg)   BMI Readings from Last 3 Encounters:  07/07/22 31.58 kg/m  07/03/22 32.08 kg/m  06/16/22 31.79 kg/m    Allergies  Allergen Reactions   Amoxicillin Diarrhea    Medications Reviewed Today     Reviewed by Kathyrn Sheriff, [facility] (Pharmacist) on 07/10/22 at 1205  Med List Status: <None>   Medication Order Taking? Sig Documenting Provider Last Dose Status Informant  acetaminophen (TYLENOL) 500 MG tablet [id number] Yes Take 500 mg by mouth every 6 (six) hours as needed. [provider] Taking Active    amLODipine-benazepril (LOTREL) 10-40 MG capsule [id number] Yes Take 1  capsule by mouth daily. Excell Seltzer, MD Taking Active   Cholecalciferol (VITAMIN D) 50 MCG (2000 UT) CAPS [id number] Yes Take by mouth. [provider] Taking Active   Continuous Glucose Sensor (FREESTYLE LIBRE 3 SENSOR) [place] [id number] Yes Place 1 sensor on the skin every 14 days. Use to check glucose continuously Ermalene Searing, Amy E, MD Taking Active   glucose monitoring kit (FREESTYLE) monitoring kit [id number] Yes 1 each by Does not apply route as needed for other. Excell Seltzer, MD Taking Active   ketorolac (ACULAR) 0.5 % ophthalmic solution [id number] Yes Place 1 drop into the left eye 4 (four) times daily.  Taking Active   linaclotide (LINZESS) 145 MCG CAPS capsule [id number] Yes Take 1 capsule (145 mcg total) by mouth daily before breakfast. Ermalene Searing, Amy E, MD Taking Active   omeprazole (PRILOSEC) 20 MG capsule [id number] Yes Take 1 capsule (20 mg total) by mouth daily. Excell Seltzer, MD Taking Active   ondansetron (ZOFRAN) 4 MG tablet [id number] Yes Take 1 tablet (4 mg total) by mouth every 8 (eight) hours as needed for nausea or vomiting. Excell Seltzer, MD Taking Active   Semaglutide,0.25 or 0.5MG /DOS, (OZEMPIC, 0.25 OR 0.5 MG/DOSE,) 2 MG/3ML SOPN [id number] Yes Inject 0.5 mg into the skin once a week. VIA NOVO CARES PAP  Patient taking differently: Inject 0.25 mg into the skin once a week. VIA NOVO CARES PAP   Bedsole, Amy E, MD Taking Active             SDOH:  (Social Determinants of Health) assessments and interventions performed: No SDOH Interventions    Flowsheet Row Clinical Support from 05/17/2022 in [facility] Visit from 04/21/2022 in [facility] Chronic Care Management from 05/11/2020 in [facility] at [facility] Chronic Care Management from 02/10/2020 in [facility] at [facility] Clinical  Support from 06/12/2018 in [facility] [place]  SDOH Interventions       Food Insecurity Interventions Intervention Not Indicated -- -- -- --  Housing Interventions Intervention Not Indicated -- -- -- --  Transportation Interventions Intervention Not Indicated -- -- -- --  Utilities Interventions Intervention Not Indicated -- -- -- --  Alcohol Usage Interventions Intervention Not Indicated (Score <7) -- -- -- --  Depression Interventions/Treatment  -- Counseling -- -- PHQ2-9 Score <4 Follow-up Not Indicated  Financial Strain Interventions Intervention Not Indicated -- Intervention Not Indicated  [Meds affordable] Intervention Not Indicated --  Physical Activity Interventions Intervention Not Indicated -- -- -- --  Stress Interventions Intervention Not Indicated -- -- -- --  Social Connections Interventions Intervention Not Indicated -- -- -- --       Medication Assistance:  [employer] Cares PAP 2024  Medication Access: Within the past 30 days, how often has patient missed a dose of medication? 0 Is a pillbox or other method used to improve adherence? No  Factors that may affect medication adherence? no barriers identified Are meds synced by current pharmacy? No  Are meds delivered by current pharmacy? Yes  Does patient experience delays in picking up medications due to transportation concerns? No  Current Rx insurance plan: HTA Name and location of Current pharmacy:  [facility] [address] [place] [address] Phone: [phone] Fax: [phone]   Compliance/Adherence/Medication fill history: Care Gaps: Colonoscopy (due 12/2020) Eye exam (due 03/2021)  Star-Rating Drugs:  Valsartan - PDC 100% Ozempic - PAP   Assessment/Plan  Hypertension (BP goal <140/90) -Not ideally controlled - BP is somewhat improved with higher dose Lotrel -pt has history of chronic intermittent dizziness and blurred  vision which pt reports are worse when she takes BP meds -Current home readings: today - 136/78, few hours later 155/60 -Current treatment: Amlodipine-benazepril 10-40 mg daily AM - Appropriate, Query Effective -Medications previously tried: losartan, HCTZ, valsartan -Educated on BP goals and benefits of medications for prevention of heart attack, stroke and kidney damage; Importance of home blood pressure monitoring; Proper BP monitoring technique; -Counseled to monitor BP at home daily, document, and provide log at future appointments -Recommended to continue current medication; keep PCP appt 6/25  Diabetes (A1c goal <7%) -Controlled - A1c 6.7% (05/2022) -  -Pt is wearing CGM - [employer] Libre 3. She reports since changing sensor on Sat June 8 she has been getting a lot of low alarms, she denies s/sx of hypoglycemia; discussed these alarms are likely malfunctions of the sensor, advised she call [employer] to troubleshoot/replace sensor Reviewed AGP report: 06/27/22 to 07/10/22. Sensor active: 97%  Time in range (70-180): 98% (goal > 70%)  High (180-250): 0%  Very high (>250): 2%  Low (< 70): 0% (goal < 4%)  GMI: 6.1%; Average glucose: 116  -Current medications: Ozempic 0.25 mg weekly - Appropriate, Effective, Safe, Accessible [employer] Libre 3 (phone) -Medications previously tried: Merchandiser, retail (cost)  -Educated on A1c and blood sugar goals; -Counseled to check feet daily and get yearly eye exams -Recommended to continue current medication  Hyperlipidemia: (LDL goal < 70) -Uncontrolled - LDL 96 (05/2021) above ideal goal; pt has documented history of statin intolerance but unclear which statin she has tried -Current treatment: none -Medications previously tried: unknown  -Educated on Cholesterol goals;  -Plan to discuss statin history at next visit   Al Corpus, PharmD, [place], CPP Clinical Sports administrator Healthcare at [facility] [phone]

## 2022-07-13 ENCOUNTER — Encounter: Payer: Self-pay | Admitting: Family Medicine

## 2022-07-19 ENCOUNTER — Other Ambulatory Visit: Payer: Self-pay | Admitting: Family Medicine

## 2022-07-19 ENCOUNTER — Other Ambulatory Visit: Payer: Self-pay

## 2022-07-19 DIAGNOSIS — Z1231 Encounter for screening mammogram for malignant neoplasm of breast: Secondary | ICD-10-CM

## 2022-07-19 DIAGNOSIS — E113511 Type 2 diabetes mellitus with proliferative diabetic retinopathy with macular edema, right eye: Secondary | ICD-10-CM | POA: Diagnosis not present

## 2022-07-19 DIAGNOSIS — E113312 Type 2 diabetes mellitus with moderate nonproliferative diabetic retinopathy with macular edema, left eye: Secondary | ICD-10-CM | POA: Diagnosis not present

## 2022-07-19 MED ORDER — AMLODIPINE BESY-BENAZEPRIL HCL 10-40 MG PO CAPS
1.0000 | ORAL_CAPSULE | Freq: Every day | ORAL | 1 refills | Status: DC
  Filled 2022-07-19: qty 90, 90d supply, fill #0

## 2022-07-25 ENCOUNTER — Ambulatory Visit: Payer: PPO | Admitting: Family Medicine

## 2022-07-26 ENCOUNTER — Encounter: Payer: Self-pay | Admitting: Family Medicine

## 2022-07-26 ENCOUNTER — Other Ambulatory Visit: Payer: Self-pay

## 2022-07-26 ENCOUNTER — Ambulatory Visit (INDEPENDENT_AMBULATORY_CARE_PROVIDER_SITE_OTHER): Payer: PPO | Admitting: Family Medicine

## 2022-07-26 VITALS — BP 122/80 | HR 92 | Temp 97.9°F | Ht 61.0 in | Wt 165.0 lb

## 2022-07-26 DIAGNOSIS — E1159 Type 2 diabetes mellitus with other circulatory complications: Secondary | ICD-10-CM

## 2022-07-26 DIAGNOSIS — I152 Hypertension secondary to endocrine disorders: Secondary | ICD-10-CM | POA: Diagnosis not present

## 2022-07-26 DIAGNOSIS — E1149 Type 2 diabetes mellitus with other diabetic neurological complication: Secondary | ICD-10-CM

## 2022-07-26 DIAGNOSIS — Z7985 Long-term (current) use of injectable non-insulin antidiabetic drugs: Secondary | ICD-10-CM

## 2022-07-26 DIAGNOSIS — R413 Other amnesia: Secondary | ICD-10-CM

## 2022-07-26 LAB — CBC WITH DIFFERENTIAL/PLATELET
Basophils Absolute: 0.1 10*3/uL (ref 0.0–0.1)
Basophils Relative: 0.8 % (ref 0.0–3.0)
Eosinophils Absolute: 0.2 10*3/uL (ref 0.0–0.7)
Eosinophils Relative: 2.7 % (ref 0.0–5.0)
HCT: 39.6 % (ref 36.0–46.0)
Hemoglobin: 13.1 g/dL (ref 12.0–15.0)
Lymphocytes Relative: 31.9 % (ref 12.0–46.0)
Lymphs Abs: 2.1 10*3/uL (ref 0.7–4.0)
MCHC: 33 g/dL (ref 30.0–36.0)
MCV: 94.9 fl (ref 78.0–100.0)
Monocytes Absolute: 0.4 10*3/uL (ref 0.1–1.0)
Monocytes Relative: 6.1 % (ref 3.0–12.0)
Neutro Abs: 3.8 10*3/uL (ref 1.4–7.7)
Neutrophils Relative %: 58.5 % (ref 43.0–77.0)
Platelets: 251 10*3/uL (ref 150.0–400.0)
RBC: 4.17 Mil/uL (ref 3.87–5.11)
RDW: 13 % (ref 11.5–15.5)
WBC: 6.6 10*3/uL (ref 4.0–10.5)

## 2022-07-26 LAB — POCT GLYCOSYLATED HEMOGLOBIN (HGB A1C): Hemoglobin A1C: 5.9 % — AB (ref 4.0–5.6)

## 2022-07-26 LAB — TSH: TSH: 1.11 u[IU]/mL (ref 0.35–5.50)

## 2022-07-26 LAB — VITAMIN D 25 HYDROXY (VIT D DEFICIENCY, FRACTURES): VITD: 40.83 ng/mL (ref 30.00–100.00)

## 2022-07-26 LAB — VITAMIN B12: Vitamin B-12: 290 pg/mL (ref 211–911)

## 2022-07-26 MED ORDER — GUAIFENESIN-CODEINE 100-10 MG/5ML PO SYRP
5.0000 mL | ORAL_SOLUTION | Freq: Every evening | ORAL | 0 refills | Status: DC | PRN
  Filled 2022-07-26: qty 180, 18d supply, fill #0

## 2022-07-26 NOTE — Progress Notes (Signed)
Patient ID: Patricia Horne, female    DOB: 03-01-1950, 72 y.o.   MRN: [id number]  This visit was conducted in person.  BP 122/80 (BP Location: Left Arm, Patient Position: Sitting, Cuff Size: Normal)   Pulse 92   Temp 97.9 F (36.6 C) (Temporal)   Ht 5\' 1"  (1.549 m)   Wt 165 lb (74.8 kg)   SpO2 99%   BMI 31.18 kg/m    CC:  Chief Complaint  Patient presents with   Diabetes    And A1c done today    Subjective:   HPI: Patricia Horne is a 72 y.o. female presenting on 07/26/2022 for Diabetes (And A1c done today)    She has noted memory issues overtime gradually at work. No family history of alzheimer's.   CT head no intracranial issue. 05/2022  Hypertension:   Significant improvement in BP on higher dose of amlodipine /benazepril  10/40 mg daily.. has some associated dry cough since starting BP Readings from Last 3 Encounters:  07/26/22 122/80  07/07/22 (!) 175/86  07/03/22 (!) 165/84  Using medication without problems or lightheadedness:  none Chest pain with exertion:none Edema: stable Short of breath:none Average home BPs:   Diabetes:  well controlled on  semaglutide 0.25 mg weekly Lab Results  Component Value Date   HGBA1C 5.9 (A) 07/26/2022  Using medications without difficulties: Hypoglycemic episodes: Did have some lows when increase BP medication.. so stopped temporarily... I do not think his is related.  Hyperglycemic episodes: none Feet problems: Blood Sugars averaging: some lows in last 2 week.. 1% in last 14 days. 97% in range. eye exam within last year:   Cataract extraction on 07/03/2022     Relevant past medical, surgical, family and social history reviewed and updated as indicated. Interim medical history since our last visit reviewed. Allergies and medications reviewed and updated. Outpatient Medications Prior to Visit  Medication Sig Dispense Refill   acetaminophen (TYLENOL) 500 MG tablet Take 500 mg by mouth every 6 (six) hours as needed.      amLODipine-benazepril (LOTREL) 10-40 MG capsule Take 1 capsule by mouth daily. 90 capsule 1   Cholecalciferol (VITAMIN D) 50 MCG (2000 UT) CAPS Take by mouth.     Continuous Glucose Sensor (FREESTYLE LIBRE 3 SENSOR) MISC Place 1 sensor on the skin every 14 days. Use to check glucose continuously 2 each 11   glucose monitoring kit (FREESTYLE) monitoring kit 1 each by Does not apply route as needed for other. 1 each 0   ketorolac (ACULAR) 0.5 % ophthalmic solution Place 1 drop into the left eye 4 (four) times daily. 5 mL 0   linaclotide (LINZESS) 145 MCG CAPS capsule Take 1 capsule (145 mcg total) by mouth daily before breakfast. 30 capsule 11   omeprazole (PRILOSEC) 20 MG capsule Take 1 capsule (20 mg total) by mouth daily. 30 capsule 3   ondansetron (ZOFRAN) 4 MG tablet Take 1 tablet (4 mg total) by mouth every 8 (eight) hours as needed for nausea or vomiting. 20 tablet 0   Semaglutide,0.25 or 0.5MG /DOS, (OZEMPIC, 0.25 OR 0.5 MG/DOSE,) 2 MG/3ML SOPN Inject 0.5 mg into the skin once a week. VIA NOVO CARES PAP (Patient taking differently: Inject 0.25 mg into the skin once a week. VIA NOVO CARES PAP) 3 mL 0   No facility-administered medications prior to visit.     Per HPI unless specifically indicated in ROS section below Review of Systems  Constitutional:  Negative for chills, fatigue and  fever.  HENT:  Negative for congestion and ear pain.   Eyes:  Negative for pain and redness.  Respiratory:  Negative for cough and shortness of breath.   Cardiovascular:  Negative for chest pain, palpitations and leg swelling.  Gastrointestinal:  Negative for abdominal pain, blood in stool, constipation, diarrhea, nausea and vomiting.  Genitourinary:  Negative for dysuria and vaginal bleeding.  Musculoskeletal:  Negative for back pain, falls and myalgias.  Skin:  Negative for rash.  Neurological:  Negative for dizziness, syncope, light-headedness and headaches.  Psychiatric/Behavioral:  Negative for  depression and dysphoric mood. The patient is not nervous/anxious.    Objective:  BP 122/80 (BP Location: Left Arm, Patient Position: Sitting, Cuff Size: Normal)   Pulse 92   Temp 97.9 F (36.6 C) (Temporal)   Ht 5\' 1"  (1.549 m)   Wt 165 lb (74.8 kg)   SpO2 99%   BMI 31.18 kg/m   Wt Readings from Last 3 Encounters:  07/26/22 165 lb (74.8 kg)  07/07/22 167 lb 2 oz (75.8 kg)  07/03/22 169 lb 12.8 oz (77 kg)      Physical Exam Constitutional:      General: She is not in acute distress.    Appearance: Normal appearance. She is well-developed. She is not ill-appearing or toxic-appearing.  HENT:     Head: Normocephalic.     Right Ear: Hearing, tympanic membrane, ear canal and external ear normal. Tympanic membrane is not erythematous, retracted or bulging.     Left Ear: Hearing, tympanic membrane, ear canal and external ear normal. Tympanic membrane is not erythematous, retracted or bulging.     Nose: No mucosal edema or rhinorrhea.     Right Sinus: No maxillary sinus tenderness or frontal sinus tenderness.     Left Sinus: No maxillary sinus tenderness or frontal sinus tenderness.     Comments: Swelling in bilateral turbinates    Mouth/Throat:     Pharynx: Uvula midline.  Eyes:     General: Lids are normal. Lids are everted, no foreign bodies appreciated.     Conjunctiva/sclera: Conjunctivae normal.     Pupils: Pupils are equal, round, and reactive to light.  Neck:     Thyroid: No thyroid mass or thyromegaly.     Vascular: No carotid bruit.     Trachea: Trachea normal.  Cardiovascular:     Rate and Rhythm: Normal rate and regular rhythm.     Pulses: Normal pulses.     Heart sounds: Normal heart sounds, S1 normal and S2 normal. No murmur heard.    No friction rub. No gallop.  Pulmonary:     Effort: Pulmonary effort is normal. No tachypnea or respiratory distress.     Breath sounds: Normal breath sounds. No decreased breath sounds, wheezing, rhonchi or rales.  Abdominal:      General: Bowel sounds are normal.     Palpations: Abdomen is soft.     Tenderness: There is no abdominal tenderness.  Musculoskeletal:     Cervical back: Normal range of motion and neck supple.  Skin:    General: Skin is warm and dry.     Findings: No rash.  Neurological:     Mental Status: She is alert.  Psychiatric:        Mood and Affect: Mood is not anxious or depressed.        Speech: Speech normal.        Behavior: Behavior normal. Behavior is cooperative.  Thought Content: Thought content normal.        Judgment: Judgment normal.       Results for orders placed or performed in visit on 07/26/22  POCT glycosylated hemoglobin (Hb A1C)  Result Value Ref Range   Hemoglobin A1C 5.9 (A) 4.0 - 5.6 %   HbA1c POC (<> result, manual entry)     HbA1c, POC (prediabetic range)     HbA1c, POC (controlled diabetic range)    VIRTUAL VISIT A virtual visit is felt to be most appropriate for this patient at this time.   I connected with the patient on 07/26/22 at 10:20 AM EDT by virtual telehealth platform and verified that I am speaking with the correct person using two identifiers.   I discussed the limitations, risks, security and privacy concerns of performing an evaluation and management service by  virtual telehealth platform and the availability of in person appointments. I also discussed with the patient that there may be a patient responsible charge related to this service. The patient expressed understanding and agreed to proceed.  Patient location: Home Provider Location: [facility] [place] [facility] Participants: Kerby Nora and Floria Raveling   Chief Complaint  Patient presents with   Diabetes    And A1c done today    History of Present Illness:  71 y.o. female patient of Rilee Wendling E, MD presents with      COVID 19 screen No recent travel or known exposure to COVID19 The patient denies respiratory symptoms of COVID 19 at this time.  The importance of social  distancing was discussed today.   Review of Systems  Constitutional:  Negative for chills, fatigue and fever.  HENT:  Negative for congestion and ear pain.   Eyes:  Negative for pain and redness.  Respiratory:  Negative for cough and shortness of breath.   Cardiovascular:  Negative for chest pain, palpitations and leg swelling.  Gastrointestinal:  Negative for abdominal pain, blood in stool, constipation, diarrhea, nausea and vomiting.  Genitourinary:  Negative for dysuria and vaginal bleeding.  Musculoskeletal:  Negative for back pain, falls and myalgias.  Skin:  Negative for rash.  Neurological:  Negative for dizziness, syncope, light-headedness and headaches.  Psychiatric/Behavioral:  Negative for depression and dysphoric mood. The patient is not nervous/anxious.       Past Medical History:  Diagnosis Date   Abdominal pain, left lower quadrant    Acute upper respiratory infections of unspecified site    Allergic genetic state    Arthritis    in arms   Glaucoma    Grade I diastolic dysfunction    Lipoma of unspecified site    Neuropathy    Obesity    Other and unspecified noninfectious gastroenteritis and colitis(558.9)    Other screening mammogram    Routine general medical examination at a health care facility    Screening for lipoid disorders    Special screening for osteoporosis    Tibialis tendinitis    Tubular adenoma of colon 03/31/2014   Type II or unspecified type diabetes mellitus without mention of complication, not stated as uncontrolled    Undiagnosed cardiac murmurs    Unspecified essential hypertension     reports that she has never smoked. She has never used smokeless tobacco. She reports that she does not drink alcohol and does not use drugs.   Current Outpatient Medications:    acetaminophen (TYLENOL) 500 MG tablet, Take 500 mg by mouth every 6 (six) hours as needed., Disp: , Rfl:  amLODipine-benazepril (LOTREL) 10-40 MG capsule, Take 1 capsule by  mouth daily., Disp: 90 capsule, Rfl: 1   Cholecalciferol (VITAMIN D) 50 MCG (2000 UT) CAPS, Take by mouth., Disp: , Rfl:    Continuous Glucose Sensor (FREESTYLE LIBRE 3 SENSOR) MISC, Place 1 sensor on the skin every 14 days. Use to check glucose continuously, Disp: 2 each, Rfl: 11   glucose monitoring kit (FREESTYLE) monitoring kit, 1 each by Does not apply route as needed for other., Disp: 1 each, Rfl: 0   guaiFENesin-codeine (ROBITUSSIN AC) 100-10 MG/5ML syrup, Take 5-10 mLs by mouth at bedtime as needed for cough., Disp: 180 mL, Rfl: 0   ketorolac (ACULAR) 0.5 % ophthalmic solution, Place 1 drop into the left eye 4 (four) times daily., Disp: 5 mL, Rfl: 0   linaclotide (LINZESS) 145 MCG CAPS capsule, Take 1 capsule (145 mcg total) by mouth daily before breakfast., Disp: 30 capsule, Rfl: 11   omeprazole (PRILOSEC) 20 MG capsule, Take 1 capsule (20 mg total) by mouth daily., Disp: 30 capsule, Rfl: 3   ondansetron (ZOFRAN) 4 MG tablet, Take 1 tablet (4 mg total) by mouth every 8 (eight) hours as needed for nausea or vomiting., Disp: 20 tablet, Rfl: 0   Semaglutide,0.25 or 0.5MG /DOS, (OZEMPIC, 0.25 OR 0.5 MG/DOSE,) 2 MG/3ML SOPN, Inject 0.5 mg into the skin once a week. VIA NOVO CARES PAP (Patient taking differently: Inject 0.25 mg into the skin once a week. VIA NOVO CARES PAP), Disp: 3 mL, Rfl: 0   Observations/Objective: Blood pressure 122/80, pulse 92, temperature 97.9 F (36.6 C), temperature source Temporal, height 5\' 1"  (1.549 m), weight 165 lb (74.8 kg), SpO2 99 %.  Physical Exam Constitutional:      General: The patient is not in acute distress. Pulmonary:     Effort: Pulmonary effort is normal. No respiratory distress.  Neurological:     Mental Status: The patient is alert and oriented to person, place, and time.  Psychiatric:        Mood and Affect: Mood normal.        Behavior: Behavior normal.    Assessment and Plan Type 2 diabetes mellitus with neurological complications  (HCC) Assessment & Plan: Chronic, excellent control on Ozempic 0.25 mg weekly.  She had some recent lows temporarily.  We discussed not going more than 5 hours without eating.  She would like to increase the Ozempic further to 0.5 mg weekly for additional weight loss.  I suggested that she start a healthy high-fiber protein bedtime snack to avoid lows at night and if she continues to have no further lows she can make the increase to 0.5 mg weekly Ozempic.  Orders: -     POCT glycosylated hemoglobin (Hb A1C)  Memory loss Assessment & Plan: Chronic, very gradual decrease, no sudden changes suggestive of CVA.  No clear medication cause.  She denies depression and anxiety. No family history of Parkinson's or Alzheimer's in first-degree relative. Will evaluate with labs for easily fixable secondary cause. At follow-up in 2 weeks, we will perform a Mini-Mental status exam.  Of note CT head was normal without intracranial process in May 2024  Orders: -     VITAMIN D 25 Hydroxy (Vit-D Deficiency, Fractures) -     TSH -     Vitamin B12 -     CBC with Differential/Platelet  Hypertension associated with diabetes [facility]) Assessment & Plan: Chronic, now well-controlled on amlodipine/benazepril 10/40 mg p.o. daily.  Of note she is having possible side  effect of dry cough.  She feels that she may have had the dry cough for a few weeks prior to starting the benazepril component.  I have noted some swelling in her nasal passages that we will treat with Flonase for possible allergies.  She can also use guaifenesin with codeine at bedtime temporarily as needed for cough.   Other orders -     guaiFENesin-Codeine; Take 5-10 mLs by mouth at bedtime as needed for cough.  Dispense: 180 mL; Refill: 0      I discussed the assessment and treatment plan with the patient. The patient was provided an opportunity to ask questions and all were answered. The patient agreed with the plan and demonstrated an  understanding of the instructions.   The patient was advised to call back or seek an in-person evaluation if the symptoms worsen or if the condition fails to improve as anticipated.     Kerby Nora, MD   Assessment and Plan  Type 2 diabetes mellitus with neurological complications [facility]) Assessment & Plan: Chronic, excellent control on Ozempic 0.25 mg weekly.  She had some recent lows temporarily.  We discussed not going more than 5 hours without eating.  She would like to increase the Ozempic further to 0.5 mg weekly for additional weight loss.  I suggested that she start a healthy high-fiber protein bedtime snack to avoid lows at night and if she continues to have no further lows she can make the increase to 0.5 mg weekly Ozempic.  Orders: -     POCT glycosylated hemoglobin (Hb A1C)  Memory loss Assessment & Plan: Chronic, very gradual decrease, no sudden changes suggestive of CVA.  No clear medication cause.  She denies depression and anxiety. No family history of Parkinson's or Alzheimer's in first-degree relative. Will evaluate with labs for easily fixable secondary cause. At follow-up in 2 weeks, we will perform a Mini-Mental status exam.  Of note CT head was normal without intracranial process in May 2024  Orders: -     VITAMIN D 25 Hydroxy (Vit-D Deficiency, Fractures) -     TSH -     Vitamin B12 -     CBC with Differential/Platelet  Hypertension associated with diabetes [facility]) Assessment & Plan: Chronic, now well-controlled on amlodipine/benazepril 10/40 mg p.o. daily.  Of note she is having possible side effect of dry cough.  She feels that she may have had the dry cough for a few weeks prior to starting the benazepril component.  I have noted some swelling in her nasal passages that we will treat with Flonase for possible allergies.  She can also use guaifenesin with codeine at bedtime temporarily as needed for cough.   Other orders -     guaiFENesin-Codeine; Take  5-10 mLs by mouth at bedtime as needed for cough.  Dispense: 180 mL; Refill: 0    Return in about 2 weeks (around 08/09/2022) for follow up memory.   Kerby Nora, MD

## 2022-07-26 NOTE — Assessment & Plan Note (Addendum)
Chronic, very gradual decrease, no sudden changes suggestive of CVA.  No clear medication cause.  She denies depression and anxiety. No family history of Parkinson's or Alzheimer's in first-degree relative. Will evaluate with labs for easily fixable secondary cause. At follow-up in 2 weeks, we will perform a Mini-Mental status exam.  Of note CT head was normal without intracranial process in May 2024

## 2022-07-26 NOTE — Assessment & Plan Note (Signed)
Chronic, excellent control on Ozempic 0.25 mg weekly.  She had some recent lows temporarily.  We discussed not going more than 5 hours without eating.  She would like to increase the Ozempic further to 0.5 mg weekly for additional weight loss.  I suggested that she start a healthy high-fiber protein bedtime snack to avoid lows at night and if she continues to have no further lows she can make the increase to 0.5 mg weekly Ozempic.

## 2022-07-26 NOTE — Patient Instructions (Addendum)
Please stop at the lab to have labs drawn. Can try flonase 2 sprays nostril daily.  Can use cough suppressant at night  as needed but this is not a long-term medication.  Dry cough can be from the benazepril so if not improving let me know.  Start healthy bedtime snack.. if no further lows.. can increase the Ozempic to 0.5 mg weekly for further weight loss.

## 2022-07-26 NOTE — Assessment & Plan Note (Signed)
Chronic, now well-controlled on amlodipine/benazepril 10/40 mg p.o. daily.  Of note she is having possible side effect of dry cough.  She feels that she may have had the dry cough for a few weeks prior to starting the benazepril component.  I have noted some swelling in her nasal passages that we will treat with Flonase for possible allergies.  She can also use guaifenesin with codeine at bedtime temporarily as needed for cough.

## 2022-07-27 ENCOUNTER — Ambulatory Visit
Admission: RE | Admit: 2022-07-27 | Discharge: 2022-07-27 | Disposition: A | Discharge status: Home / Self Care | Payer: PPO | Source: Ambulatory Visit | Attending: Family Medicine | Admitting: Family Medicine

## 2022-07-27 DIAGNOSIS — Z1231 Encounter for screening mammogram for malignant neoplasm of breast: Secondary | ICD-10-CM | POA: Diagnosis not present

## 2022-07-31 ENCOUNTER — Encounter: Payer: Self-pay | Admitting: Pharmacist

## 2022-07-31 NOTE — Progress Notes (Signed)
Patient previously followed by [facility] pharmacist. Per clinical review, no pharmacist appointment needed at this time. Will notify pharmacy patient advocate team of patient assistance application for Ozempic for 2025 renewal. Care guide directed to contact patient and cancel appointment and notify pharmacy team of any patient concerns.

## 2022-08-02 DIAGNOSIS — E113511 Type 2 diabetes mellitus with proliferative diabetic retinopathy with macular edema, right eye: Secondary | ICD-10-CM | POA: Diagnosis not present

## 2022-08-09 ENCOUNTER — Encounter: Payer: Self-pay | Admitting: Family Medicine

## 2022-08-09 ENCOUNTER — Ambulatory Visit (INDEPENDENT_AMBULATORY_CARE_PROVIDER_SITE_OTHER): Payer: PPO | Admitting: Family Medicine

## 2022-08-09 VITALS — BP 153/77 | HR 78 | Temp 97.9°F | Ht 61.0 in | Wt 167.4 lb

## 2022-08-09 DIAGNOSIS — R413 Other amnesia: Secondary | ICD-10-CM

## 2022-08-09 NOTE — Patient Instructions (Signed)
Work on regular exercise and improving quality of sleep.  Consider melatonin for  sleep if needed.

## 2022-08-09 NOTE — Progress Notes (Addendum)
Patient ID: Bailee Metter, female    DOB: [birth year], 72 y.o.   MRN: [id number]  This visit was conducted in person.  BP (!) 153/77   Pulse 78   Temp 97.9 F (36.6 C) (Temporal)   Ht 5\' 1"  (1.549 m)   Wt 167 lb 6 oz (75.9 kg)   SpO2 96%   BMI 31.63 kg/m    CC:  Chief Complaint  Patient presents with   Medical Management of Chronic Issues    F/U Memory    Subjective:   HPI: Wynee Amaziah Ghosh is a 72 y.o. female presenting on 08/09/2022 for Medical Management of Chronic Issues (F/U Memory)   She has noted gradual memory changes overtime in last 7 month.  No sudden loss.  No family history of Alzheimer's  Lab evaluation shows normal white blood cell count, Hg, B12, vit D and thyroid  She is not on any sedating medication or supplement.  She has had a lot of stressors, family loss.  She denies depression or anxiety.  She does wake at night a lot.. 6 hour a night.     BP at home 130-144/ 80-86  Relevant past medical, surgical, family and social history reviewed and updated as indicated. Interim medical history since our last visit reviewed. Allergies and medications reviewed and updated. Outpatient Medications Prior to Visit  Medication Sig Dispense Refill   acetaminophen (TYLENOL) 500 MG tablet Take 500 mg by mouth every 6 (six) hours as needed.     amLODipine-benazepril (LOTREL) 10-40 MG capsule Take 1 capsule by mouth daily. 90 capsule 1   Cholecalciferol (VITAMIN D) 50 MCG (2000 UT) CAPS Take by mouth.     Continuous Glucose Sensor (FREESTYLE LIBRE 3 SENSOR) MISC Place 1 sensor on the skin every 14 days. Use to check glucose continuously 2 each 11   glucose monitoring kit (FREESTYLE) monitoring kit 1 each by Does not apply route as needed for other. 1 each 0   guaiFENesin-codeine (ROBITUSSIN AC) 100-10 MG/5ML syrup Take 5-10 mLs by mouth at bedtime as needed for cough. 180 mL 0   linaclotide (LINZESS) 145 MCG CAPS capsule Take 1 capsule (145 mcg total) by mouth  daily before breakfast. 30 capsule 11   omeprazole (PRILOSEC) 20 MG capsule Take 1 capsule (20 mg total) by mouth daily. 30 capsule 3   ondansetron (ZOFRAN) 4 MG tablet Take 1 tablet (4 mg total) by mouth every 8 (eight) hours as needed for nausea or vomiting. 20 tablet 0   Semaglutide,0.25 or 0.5MG /DOS, (OZEMPIC, 0.25 OR 0.5 MG/DOSE,) 2 MG/3ML SOPN Inject 0.5 mg into the skin once a week. VIA NOVO CARES PAP (Patient taking differently: Inject 0.25 mg into the skin once a week. VIA NOVO CARES PAP) 3 mL 0   ketorolac (ACULAR) 0.5 % ophthalmic solution Place 1 drop into the left eye 4 (four) times daily. 5 mL 0   No facility-administered medications prior to visit.     Per HPI unless specifically indicated in ROS section below Review of Systems  Constitutional:  Negative for fatigue and fever.  HENT:  Negative for congestion.   Eyes:  Negative for pain.  Respiratory:  Negative for cough and shortness of breath.   Cardiovascular:  Negative for chest pain, palpitations and leg swelling.  Gastrointestinal:  Negative for abdominal pain.  Genitourinary:  Negative for dysuria and vaginal bleeding.  Musculoskeletal:  Negative for back pain.  Neurological:  Negative for syncope, light-headedness and headaches.  Psychiatric/Behavioral:  Negative for dysphoric mood.    Objective:  BP (!) 153/77   Pulse 78   Temp 97.9 F (36.6 C) (Temporal)   Ht 5\' 1"  (1.549 m)   Wt 167 lb 6 oz (75.9 kg)   SpO2 96%   BMI 31.63 kg/m   Wt Readings from Last 3 Encounters:  08/09/22 167 lb 6 oz (75.9 kg)  07/26/22 165 lb (74.8 kg)  07/07/22 167 lb 2 oz (75.8 kg)      Physical Exam Constitutional:      General: She is not in acute distress.    Appearance: Normal appearance. She is well-developed. She is not ill-appearing or toxic-appearing.  HENT:     Head: Normocephalic.     Right Ear: Hearing, tympanic membrane, ear canal and external ear normal. Tympanic membrane is not erythematous, retracted or  bulging.     Left Ear: Hearing, tympanic membrane, ear canal and external ear normal. Tympanic membrane is not erythematous, retracted or bulging.     Nose: No mucosal edema or rhinorrhea.     Right Sinus: No maxillary sinus tenderness or frontal sinus tenderness.     Left Sinus: No maxillary sinus tenderness or frontal sinus tenderness.     Mouth/Throat:     Pharynx: Uvula midline.  Eyes:     General: Lids are normal. Lids are everted, no foreign bodies appreciated.     Conjunctiva/sclera: Conjunctivae normal.     Pupils: Pupils are equal, round, and reactive to light.  Neck:     Thyroid: No thyroid mass or thyromegaly.     Vascular: No carotid bruit.     Trachea: Trachea normal.  Cardiovascular:     Rate and Rhythm: Normal rate and regular rhythm.     Pulses: Normal pulses.     Heart sounds: Normal heart sounds, S1 normal and S2 normal. No murmur heard.    No friction rub. No gallop.  Pulmonary:     Effort: Pulmonary effort is normal. No tachypnea or respiratory distress.     Breath sounds: Normal breath sounds. No decreased breath sounds, wheezing, rhonchi or rales.  Abdominal:     General: Bowel sounds are normal.     Palpations: Abdomen is soft.     Tenderness: There is no abdominal tenderness.  Musculoskeletal:     Cervical back: Normal range of motion and neck supple.  Skin:    General: Skin is warm and dry.     Findings: No rash.  Neurological:     Mental Status: She is alert.  Psychiatric:        Mood and Affect: Mood is not anxious or depressed.        Speech: Speech normal.        Behavior: Behavior normal. Behavior is cooperative.        Thought Content: Thought content normal.        Judgment: Judgment normal.       MMSE 30/30, 13 in 15 sec of animal recall, clock drawing normal. Results for orders placed or performed in visit on 07/26/22  VITAMIN D 25 Hydroxy (Vit-D Deficiency, Fractures)  Result Value Ref Range   VITD 40.83 30.00 - 100.00 ng/mL  TSH   Result Value Ref Range   TSH 1.11 0.35 - 5.50 uIU/mL  Vitamin B12  Result Value Ref Range   Vitamin B-12 290 211 - 911 pg/mL  CBC with Differential/Platelet  Result Value Ref Range   WBC 6.6 4.0 - 10.5 K/uL   RBC  4.17 3.87 - 5.11 Mil/uL   Hemoglobin 13.1 12.0 - 15.0 g/dL   HCT 16.1 09.6 - 04.5 %   MCV 94.9 78.0 - 100.0 fl   MCHC 33.0 30.0 - 36.0 g/dL   RDW 40.9 81.1 - 91.4 %   Platelets 251.0 150.0 - 400.0 K/uL   Neutrophils Relative % 58.5 43.0 - 77.0 %   Lymphocytes Relative 31.9 12.0 - 46.0 %   Monocytes Relative 6.1 3.0 - 12.0 %   Eosinophils Relative 2.7 0.0 - 5.0 %   Basophils Relative 0.8 0.0 - 3.0 %   Neutro Abs 3.8 1.4 - 7.7 K/uL   Lymphs Abs 2.1 0.7 - 4.0 K/uL   Monocytes Absolute 0.4 0.1 - 1.0 K/uL   Eosinophils Absolute 0.2 0.0 - 0.7 K/uL   Basophils Absolute 0.1 0.0 - 0.1 K/uL  POCT glycosylated hemoglobin (Hb A1C)  Result Value Ref Range   Hemoglobin A1C 5.9 (A) 4.0 - 5.6 %   HbA1c POC (<> result, manual entry)     HbA1c, POC (prediabetic range)     HbA1c, POC (controlled diabetic range)      Assessment and Plan  Memory loss Assessment & Plan: Chronic, gradual, mild Mini-Mental status exam, animal recall and clock drawing all within the normal range. Lab evaluation shows no  secondary causes of memory issues. Patient does not get restful sleep and uses a TV at night in bed.  I encouraged her to consider trying melatonin and sound machine to improve her quality of sleep. Denies depression and anxiety at this time.   No clear indication or red flag for further workup or MRI.  No family history of Alzheimer's disease.  Encouraged her to work on healthy lifestyle, adequate sleep and regular exercise to prevent memory issues. If symptoms progress we can consider neurocognitive testing versus MRI versus neuro referral.     Return in about 2 months (around 10/10/2022) for diabetes follow up.   Kerby Nora, MD

## 2022-08-09 NOTE — Assessment & Plan Note (Addendum)
Chronic, gradual, mild Mini-Mental status exam, animal recall and clock drawing all within the normal range. Lab evaluation shows no  secondary causes of memory issues. Patient does not get restful sleep and uses a TV at night in bed.  I encouraged her to consider trying melatonin and sound machine to improve her quality of sleep. Denies depression and anxiety at this time.   No clear indication or red flag for further workup or MRI.  No family history of Alzheimer's disease.  Encouraged her to work on healthy lifestyle, adequate sleep and regular exercise to prevent memory issues. If symptoms progress we can consider neurocognitive testing versus MRI versus neuro referral.

## 2022-08-14 ENCOUNTER — Encounter: Payer: PPO | Admitting: Pharmacist

## 2022-08-23 DIAGNOSIS — E113312 Type 2 diabetes mellitus with moderate nonproliferative diabetic retinopathy with macular edema, left eye: Secondary | ICD-10-CM | POA: Diagnosis not present

## 2022-09-06 DIAGNOSIS — E113511 Type 2 diabetes mellitus with proliferative diabetic retinopathy with macular edema, right eye: Secondary | ICD-10-CM | POA: Diagnosis not present

## 2022-09-12 ENCOUNTER — Telehealth: Payer: Self-pay | Admitting: Family Medicine

## 2022-09-12 NOTE — Progress Notes (Unsigned)
Attempted to contact patient. Left HIPAA compliant message for patient to return my call at their convenience.   Catie Eppie Gibson, PharmD, BCACP, CPP Clinical Pharmacist [facility] [phone]

## 2022-09-12 NOTE — Telephone Encounter (Signed)
Patient is requesting a call back regarding medication ozempic, patient can be reach at mobile  number.

## 2022-09-13 NOTE — Telephone Encounter (Signed)
Provided patient phone number to call [employer]o Nordisk to follow up on status of Ozempic refill shipment.   Catie Eppie Gibson, PharmD, BCACP, CPP Clinical Pharmacist [facility] [phone]

## 2022-09-13 NOTE — Telephone Encounter (Signed)
Patient is needing a refill on medication Ozempic, states she usually picks this up from the office. Unsure where to route this, sent to provider pool

## 2022-09-14 ENCOUNTER — Encounter (INDEPENDENT_AMBULATORY_CARE_PROVIDER_SITE_OTHER): Payer: Self-pay

## 2022-09-18 ENCOUNTER — Telehealth: Payer: Self-pay | Admitting: Family Medicine

## 2022-09-18 ENCOUNTER — Other Ambulatory Visit: Payer: Self-pay

## 2022-09-18 MED ORDER — OZEMPIC (0.25 OR 0.5 MG/DOSE) 2 MG/3ML ~~LOC~~ SOPN
0.2500 mg | PEN_INJECTOR | SUBCUTANEOUS | 0 refills | Status: DC
  Filled 2022-09-18: qty 3, 28d supply, fill #0
  Filled 2022-09-18: qty 3, 56d supply, fill #0

## 2022-09-18 NOTE — Telephone Encounter (Signed)
Refill sent as requested. 

## 2022-09-18 NOTE — Telephone Encounter (Signed)
Pt called in stated she needs a new prescription  Prescription Request  09/18/2022  LOV: 08/09/2022  What is the name of the medication or equipment? Semaglutide,0.25 or 0.5MG /DOS, (OZEMPIC, 0.25 OR 0.5 MG/DOSE,) 2 MG/3ML SOPN   Have you contacted your pharmacy to request a refill? No   Which pharmacy would you like this sent to?  [facility] [address] [place] [address] Phone: [phone] Fax: [phone]    Patient notified that their request is being sent to the clinical staff for review and that they should receive a response within 2 business days.   Please advise at Mobile [phone] (mobile)

## 2022-09-19 ENCOUNTER — Other Ambulatory Visit: Payer: Self-pay

## 2022-09-22 ENCOUNTER — Other Ambulatory Visit (HOSPITAL_COMMUNITY): Payer: Self-pay

## 2022-09-22 ENCOUNTER — Telehealth: Payer: Self-pay

## 2022-09-22 NOTE — Telephone Encounter (Signed)
Pharmacy Patient Advocate Encounter   Received notification from CoverMyMeds that prior authorization for Ozempic 2mg /28ml is required/requested.   Insurance verification completed.   The patient is insured through [employer]/ [employer] .   Per test claim: PA required; PA submitted to [employer]/ [employer] via Fax Key/confirmation #/EOC ** Status is pending

## 2022-09-25 ENCOUNTER — Telehealth: Payer: Self-pay | Admitting: Family Medicine

## 2022-09-25 NOTE — Telephone Encounter (Signed)
Received from [employer] PAP:  Ozempic 0.25/0.5 mg Lot [id number] Exp: 03/01/2024 x 5 pens.  Ms. Sulkowski notified by telephone that medication is here at the office for pick up.

## 2022-09-26 NOTE — Telephone Encounter (Signed)
Pharmacy Patient Advocate Encounter  Received notification from [employer]/ [employer] that Prior Authorization for Ozempic 2mg /35ml has been APPROVED from 09/22/22 to 09/22/23   PA #/Case ID/Reference #: [id number]  Approval letter indexed to media tab

## 2022-09-26 NOTE — Telephone Encounter (Signed)
Noted  

## 2022-09-27 ENCOUNTER — Encounter: Payer: Self-pay | Admitting: Family Medicine

## 2022-09-27 ENCOUNTER — Other Ambulatory Visit: Payer: Self-pay | Admitting: Family Medicine

## 2022-09-27 ENCOUNTER — Ambulatory Visit (INDEPENDENT_AMBULATORY_CARE_PROVIDER_SITE_OTHER)
Admission: RE | Admit: 2022-09-27 | Discharge: 2022-09-27 | Disposition: A | Discharge status: Home / Self Care | Payer: PPO | Source: Ambulatory Visit | Attending: Family Medicine | Admitting: Family Medicine

## 2022-09-27 ENCOUNTER — Other Ambulatory Visit: Payer: Self-pay

## 2022-09-27 ENCOUNTER — Ambulatory Visit (INDEPENDENT_AMBULATORY_CARE_PROVIDER_SITE_OTHER): Payer: PPO | Admitting: Family Medicine

## 2022-09-27 VITALS — BP 136/80 | HR 82 | Temp 97.6°F | Ht 61.0 in | Wt 168.0 lb

## 2022-09-27 DIAGNOSIS — I152 Hypertension secondary to endocrine disorders: Secondary | ICD-10-CM | POA: Diagnosis not present

## 2022-09-27 DIAGNOSIS — E1159 Type 2 diabetes mellitus with other circulatory complications: Secondary | ICD-10-CM | POA: Diagnosis not present

## 2022-09-27 DIAGNOSIS — R053 Chronic cough: Secondary | ICD-10-CM

## 2022-09-27 DIAGNOSIS — R918 Other nonspecific abnormal finding of lung field: Secondary | ICD-10-CM | POA: Diagnosis not present

## 2022-09-27 MED ORDER — PREDNISONE 20 MG PO TABS
ORAL_TABLET | ORAL | 0 refills | Status: DC
  Filled 2022-09-27: qty 15, 7d supply, fill #0

## 2022-09-27 MED ORDER — AMLODIPINE-OLMESARTAN 10-40 MG PO TABS
1.0000 | ORAL_TABLET | Freq: Every day | ORAL | 11 refills | Status: DC
  Filled 2022-09-27 – 2022-10-20 (×2): qty 30, 30d supply, fill #0
  Filled 2023-01-01: qty 30, 30d supply, fill #1
  Filled 2023-02-12: qty 30, 30d supply, fill #2
  Filled 2023-04-16: qty 30, 30d supply, fill #3
  Filled 2023-05-25: qty 30, 30d supply, fill #4
  Filled 2023-06-28: qty 30, 30d supply, fill #5

## 2022-09-27 MED ORDER — AZITHROMYCIN 250 MG PO TABS
ORAL_TABLET | ORAL | 0 refills | Status: DC
  Filled 2022-09-27: qty 6, 5d supply, fill #0

## 2022-09-27 NOTE — Assessment & Plan Note (Signed)
Acute but persistent cough.  No sign of viral or bacterial infection.  Lung exam clear.  Possibly due to allergies versus medication side effect versus occult reflux. Will change medication to amlodipine/olmesartan.  Given persistence of cough will evaluate with chest x-ray. If not improving with medication change she can do a trial of Prilosec 40 mg daily for possible occult reflux.  Continue Flonase 2 sprays per nostril daily.  Consider antihistamine at bedtime

## 2022-09-27 NOTE — Assessment & Plan Note (Signed)
Chronic, now well-controlled on amlodipine benazepril 10/40 mg daily but likely side effects to this medication.  Will change to amlodipine olmesartan 10/40 mg daily

## 2022-09-27 NOTE — Telephone Encounter (Signed)
Last office visit 09/27/2022 for cough.  Last refilled 07/26/22 for 180 ml with no refills.  Next appt: 10/17/22 for DM.

## 2022-09-27 NOTE — Patient Instructions (Addendum)
Stop amlodipine benazepril and changed to amlodipine olmesartan 10/40 mg daily.  If no improvement in 2-3 weeks can try a trial of  daily omeprazole ( prilosec) 2 tabs of 20 mg daily for reflux as possible cause of dry cough.

## 2022-09-27 NOTE — Progress Notes (Signed)
Patient ID: Patricia Horne, female    DOB: 10-Mar-1950, 72 y.o.   MRN: [id number]  This visit was conducted in person.  BP 136/80 (BP Location: Left Arm, Patient Position: Sitting, Cuff Size: Normal)   Pulse 82   Temp 97.6 F (36.4 C) (Temporal)   Ht 5\' 1"  (1.549 m)   Wt 168 lb (76.2 kg)   SpO2 97%   BMI 31.74 kg/m    CC:  Chief Complaint  Patient presents with   Cough    Patient has had a dry cough x couple months. Patient has been taking the robitussin cough syrup for cough; helps some but cough still present.     Subjective:   HPI: Patricia Horne is a 72 y.o. female presenting on 09/27/2022 for Cough (Patient has had a dry cough x couple months. Patient has been taking the robitussin cough syrup for cough; helps some but cough still present. )  Ongoing dry cough for several months. Mild improvement but no resolution with Flonase and cough suppressant. Non-smoker.  Minimal drainage.  Does have some runny nose after coughing fit. No ST,  no ear pain, minimal sneeze  No SOB. No wheeze.  Rare heartburn  Sore is right chest where coughing.   Recent med change to amlodipine benazepril.  This is likely the cause of her dry cough.  She states that stopping this med for 3-4 days... cough did not stop. Blood pressure improved control. BP Readings from Last 3 Encounters:  09/27/22 136/80  08/09/22 (!) 153/77  07/26/22 122/80        Relevant past medical, surgical, family and social history reviewed and updated as indicated. Interim medical history since our last visit reviewed. Allergies and medications reviewed and updated. Outpatient Medications Prior to Visit  Medication Sig Dispense Refill   acetaminophen (TYLENOL) 500 MG tablet Take 500 mg by mouth every 6 (six) hours as needed.     Cholecalciferol (VITAMIN D) 50 MCG (2000 UT) CAPS Take by mouth.     Continuous Glucose Sensor (FREESTYLE LIBRE 3 SENSOR) MISC Place 1 sensor on the skin every 14 days. Use to check  glucose continuously 2 each 11   glucose monitoring kit (FREESTYLE) monitoring kit 1 each by Does not apply route as needed for other. 1 each 0   guaiFENesin-codeine (ROBITUSSIN AC) 100-10 MG/5ML syrup Take 5-10 mLs by mouth at bedtime as needed for cough. 180 mL 0   linaclotide (LINZESS) 145 MCG CAPS capsule Take 1 capsule (145 mcg total) by mouth daily before breakfast. 30 capsule 11   omeprazole (PRILOSEC) 20 MG capsule Take 1 capsule (20 mg total) by mouth daily. 30 capsule 3   ondansetron (ZOFRAN) 4 MG tablet Take 1 tablet (4 mg total) by mouth every 8 (eight) hours as needed for nausea or vomiting. 20 tablet 0   Semaglutide,0.25 or 0.5MG /DOS, (OZEMPIC, 0.25 OR 0.5 MG/DOSE,) 2 MG/3ML SOPN Inject 0.25 mg into the skin once a week. 3 mL 0   amLODipine-benazepril (LOTREL) 10-40 MG capsule Take 1 capsule by mouth daily. 90 capsule 1   No facility-administered medications prior to visit.     Per HPI unless specifically indicated in ROS section below Review of Systems  Constitutional:  Negative for fatigue and fever.  HENT:  Negative for congestion.   Eyes:  Negative for pain.  Respiratory:  Negative for cough and shortness of breath.   Cardiovascular:  Negative for chest pain, palpitations and leg swelling.  Gastrointestinal:  Negative  for abdominal pain.  Genitourinary:  Negative for dysuria and vaginal bleeding.  Musculoskeletal:  Negative for back pain.  Neurological:  Negative for syncope, light-headedness and headaches.  Psychiatric/Behavioral:  Negative for dysphoric mood.    Objective:  BP 136/80 (BP Location: Left Arm, Patient Position: Sitting, Cuff Size: Normal)   Pulse 82   Temp 97.6 F (36.4 C) (Temporal)   Ht 5\' 1"  (1.549 m)   Wt 168 lb (76.2 kg)   SpO2 97%   BMI 31.74 kg/m   Wt Readings from Last 3 Encounters:  09/27/22 168 lb (76.2 kg)  08/09/22 167 lb 6 oz (75.9 kg)  07/26/22 165 lb (74.8 kg)      Physical Exam Constitutional:      General: She is not in  acute distress.    Appearance: Normal appearance. She is well-developed. She is not ill-appearing or toxic-appearing.  HENT:     Head: Normocephalic.     Right Ear: Hearing, tympanic membrane, ear canal and external ear normal. Tympanic membrane is not erythematous, retracted or bulging.     Left Ear: Hearing, tympanic membrane, ear canal and external ear normal. Tympanic membrane is not erythematous, retracted or bulging.     Nose: No mucosal edema or rhinorrhea.     Right Sinus: No maxillary sinus tenderness or frontal sinus tenderness.     Left Sinus: No maxillary sinus tenderness or frontal sinus tenderness.     Mouth/Throat:     Mouth: Oropharynx is clear and moist and mucous membranes are normal.     Pharynx: Uvula midline.  Eyes:     General: Lids are normal. Lids are everted, no foreign bodies appreciated.     Extraocular Movements: EOM normal.     Conjunctiva/sclera: Conjunctivae normal.     Pupils: Pupils are equal, round, and reactive to light.  Neck:     Thyroid: No thyroid mass or thyromegaly.     Vascular: No carotid bruit.     Trachea: Trachea normal.  Cardiovascular:     Rate and Rhythm: Normal rate and regular rhythm.     Pulses: Normal pulses.     Heart sounds: Normal heart sounds, S1 normal and S2 normal. No murmur heard.    No friction rub. No gallop.  Pulmonary:     Effort: Pulmonary effort is normal. No tachypnea or respiratory distress.     Breath sounds: Normal breath sounds. No decreased breath sounds, wheezing, rhonchi or rales.  Abdominal:     General: Bowel sounds are normal.     Palpations: Abdomen is soft.     Tenderness: There is no abdominal tenderness.  Musculoskeletal:     Cervical back: Normal range of motion and neck supple.  Skin:    General: Skin is warm, dry and intact.     Findings: No rash.  Neurological:     Mental Status: She is alert.  Psychiatric:        Mood and Affect: Mood is not anxious or depressed.        Speech: Speech  normal.        Behavior: Behavior normal. Behavior is cooperative.        Thought Content: Thought content normal.        Cognition and Memory: Cognition and memory normal.        Judgment: Judgment normal.       Results for orders placed or performed in visit on 07/26/22  VITAMIN D 25 Hydroxy (Vit-D Deficiency, Fractures)  Result Value  Ref Range   VITD 40.83 30.00 - 100.00 ng/mL  TSH  Result Value Ref Range   TSH 1.11 0.35 - 5.50 uIU/mL  Vitamin B12  Result Value Ref Range   Vitamin B-12 290 211 - 911 pg/mL  CBC with Differential/Platelet  Result Value Ref Range   WBC 6.6 4.0 - 10.5 K/uL   RBC 4.17 3.87 - 5.11 Mil/uL   Hemoglobin 13.1 12.0 - 15.0 g/dL   HCT 64.4 03.4 - 74.2 %   MCV 94.9 78.0 - 100.0 fl   MCHC 33.0 30.0 - 36.0 g/dL   RDW 59.5 63.8 - 75.6 %   Platelets 251.0 150.0 - 400.0 K/uL   Neutrophils Relative % 58.5 43.0 - 77.0 %   Lymphocytes Relative 31.9 12.0 - 46.0 %   Monocytes Relative 6.1 3.0 - 12.0 %   Eosinophils Relative 2.7 0.0 - 5.0 %   Basophils Relative 0.8 0.0 - 3.0 %   Neutro Abs 3.8 1.4 - 7.7 K/uL   Lymphs Abs 2.1 0.7 - 4.0 K/uL   Monocytes Absolute 0.4 0.1 - 1.0 K/uL   Eosinophils Absolute 0.2 0.0 - 0.7 K/uL   Basophils Absolute 0.1 0.0 - 0.1 K/uL  POCT glycosylated hemoglobin (Hb A1C)  Result Value Ref Range   Hemoglobin A1C 5.9 (A) 4.0 - 5.6 %   HbA1c POC (<> result, manual entry)     HbA1c, POC (prediabetic range)     HbA1c, POC (controlled diabetic range)      Assessment and Plan  Persistent cough for 3 weeks or longer Assessment & Plan: Acute but persistent cough.  No sign of viral or bacterial infection.  Lung exam clear.  Possibly due to allergies versus medication side effect versus occult reflux. Will change medication to amlodipine/olmesartan.  Given persistence of cough will evaluate with chest x-ray. If not improving with medication change she can do a trial of Prilosec 40 mg daily for possible occult reflux.  Continue Flonase 2  sprays per nostril daily.  Consider antihistamine at bedtime  Orders: -     DG Chest 2 View; Future  Hypertension associated with diabetes [facility]) Assessment & Plan: Chronic, now well-controlled on amlodipine benazepril 10/40 mg daily but likely side effects to this medication.  Will change to amlodipine olmesartan 10/40 mg daily   Other orders -     amLODIPine-Olmesartan; Take 1 tablet by mouth daily.  Dispense: 30 tablet; Refill: 11    No follow-ups on file.   Kerby Nora, MD

## 2022-09-28 ENCOUNTER — Other Ambulatory Visit: Payer: Self-pay

## 2022-09-28 MED FILL — Guaifenesin-Codeine Soln 100-10 MG/5ML: ORAL | 18 days supply | Qty: 180 | Fill #0 | Status: AC

## 2022-09-29 ENCOUNTER — Other Ambulatory Visit: Payer: Self-pay

## 2022-10-03 ENCOUNTER — Other Ambulatory Visit: Payer: Self-pay

## 2022-10-04 ENCOUNTER — Encounter: Payer: Self-pay | Admitting: Family Medicine

## 2022-10-05 ENCOUNTER — Other Ambulatory Visit: Payer: Self-pay

## 2022-10-06 DIAGNOSIS — Z1152 Encounter for screening for COVID-19: Secondary | ICD-10-CM | POA: Diagnosis not present

## 2022-10-06 DIAGNOSIS — Z20822 Contact with and (suspected) exposure to covid-19: Secondary | ICD-10-CM | POA: Diagnosis not present

## 2022-10-11 ENCOUNTER — Ambulatory Visit: Payer: PPO | Admitting: Family Medicine

## 2022-10-13 ENCOUNTER — Other Ambulatory Visit: Payer: Self-pay

## 2022-10-17 ENCOUNTER — Ambulatory Visit (INDEPENDENT_AMBULATORY_CARE_PROVIDER_SITE_OTHER): Payer: PPO | Admitting: Family Medicine

## 2022-10-17 ENCOUNTER — Other Ambulatory Visit: Payer: Self-pay

## 2022-10-17 ENCOUNTER — Encounter: Payer: Self-pay | Admitting: Family Medicine

## 2022-10-17 VITALS — BP 166/90 | HR 95 | Temp 98.7°F | Ht 61.0 in | Wt 169.4 lb

## 2022-10-17 DIAGNOSIS — E1149 Type 2 diabetes mellitus with other diabetic neurological complication: Secondary | ICD-10-CM | POA: Diagnosis not present

## 2022-10-17 DIAGNOSIS — I152 Hypertension secondary to endocrine disorders: Secondary | ICD-10-CM | POA: Diagnosis not present

## 2022-10-17 DIAGNOSIS — M546 Pain in thoracic spine: Secondary | ICD-10-CM

## 2022-10-17 DIAGNOSIS — E1159 Type 2 diabetes mellitus with other circulatory complications: Secondary | ICD-10-CM

## 2022-10-17 LAB — HM DIABETES FOOT EXAM

## 2022-10-17 NOTE — Assessment & Plan Note (Signed)
Chronic, some acute worsening given recent prednisone course.  Now per continuous she is trending back towards being in range (in the last week in range 95% of the time)  Encouraged bedtime snack Ozempic 0.5 mg injected weekly

## 2022-10-17 NOTE — Patient Instructions (Addendum)
Go ahead and make change of BP med to  amlodipine /olmesartan 10/40 mg daily.

## 2022-10-17 NOTE — Assessment & Plan Note (Signed)
Chronic, unclear reason for elevation today as was previously controlled on this medication.  Will go ahead and proceed with change to amlodipine/olmesartan 10/40 mg p.o. daily given his still with some persistent cough

## 2022-10-17 NOTE — Progress Notes (Signed)
Patient ID: Patricia Horne, female    DOB: 06-04-1950, 72 y.o.   MRN: [id number]  This visit was conducted in person.  BP (!) 166/90 (BP Location: Right Arm, Patient Position: Sitting, Cuff Size: Normal)   Pulse 95   Temp 98.7 F (37.1 C) (Temporal)   Ht 5\' 1"  (1.549 m)   Wt 169 lb 6 oz (76.8 kg)   SpO2 95%   BMI 32.00 kg/m    CC:  Chief Complaint  Patient presents with   Diabetes    Subjective:   HPI: Patricia Horne is a 72 y.o. female presenting on 10/17/2022 for Diabetes    She reports her cough is significantly better after antibiotics. 60 % better.  She has not yet changed BP med... still has some cough so will make this change.  Had to stop prednisone given CBGs 300s.  Hypertension:  May be elevated given just took med right before OV. BP Readings from Last 3 Encounters:  10/17/22 (!) 166/90  09/27/22 136/80  08/09/22 (!) 153/77  Using medication without problems or lightheadedness:  none Chest pain with exertion:none Edema: stable Short of breath:none Average home BPs:   Diabetes:  well controlled on  semaglutide 0.5 mg weekly. Lab Results  Component Value Date   HGBA1C 5.9 (A) 07/26/2022  Using medications without difficulties: Hypoglycemic episodes: last night dropped to 59. Hyperglycemic episodes:  yes Feet problems: Blood Sugars averaging: very high when on prednisone ( in last 2 weeks 8% highs > 250), FBS in last few days  in range  95% of time eye exam within last year:   Cataract extraction on 07/03/2022    Wt Readings from Last 3 Encounters:  10/17/22 169 lb 6 oz (76.8 kg)  09/27/22 168 lb (76.2 kg)  08/09/22 167 lb 6 oz (75.9 kg)     Relevant past medical, surgical, family and social history reviewed and updated as indicated. Interim medical history since our last visit reviewed. Allergies and medications reviewed and updated. Outpatient Medications Prior to Visit  Medication Sig Dispense Refill   acetaminophen (TYLENOL) 500 MG  tablet Take 500 mg by mouth every 6 (six) hours as needed.     amLODipine-olmesartan (AZOR) 10-40 MG tablet Take 1 tablet by mouth daily. 30 tablet 11   Cholecalciferol (VITAMIN D) 50 MCG (2000 UT) CAPS Take by mouth.     Continuous Glucose Sensor (FREESTYLE LIBRE 3 SENSOR) MISC Place 1 sensor on the skin every 14 days. Use to check glucose continuously 2 each 11   glucose monitoring kit (FREESTYLE) monitoring kit 1 each by Does not apply route as needed for other. 1 each 0   guaiFENesin-codeine 100-10 MG/5ML syrup Take 5-10 mLs by mouth at bedtime as needed for cough. 180 mL 0   linaclotide (LINZESS) 145 MCG CAPS capsule Take 1 capsule (145 mcg total) by mouth daily before breakfast. 30 capsule 11   omeprazole (PRILOSEC) 20 MG capsule Take 1 capsule (20 mg total) by mouth daily. 30 capsule 3   ondansetron (ZOFRAN) 4 MG tablet Take 1 tablet (4 mg total) by mouth every 8 (eight) hours as needed for nausea or vomiting. 20 tablet 0   Semaglutide,0.25 or 0.5MG /DOS, (OZEMPIC, 0.25 OR 0.5 MG/DOSE,) 2 MG/3ML SOPN Inject 0.25 mg into the skin once a week. 3 mL 0   azithromycin (ZITHROMAX) 250 MG tablet Take 2 tablets by mouth for 1 day, then 1 tablet daily until gone. 6 tablet 0   predniSONE (DELTASONE) 20  MG tablet Take 3 tablets by mouth for 3 days, then 2 tablets for 2 days, then 1 tablet for 2 days. 15 tablet 0   No facility-administered medications prior to visit.     Per HPI unless specifically indicated in ROS section below Review of Systems  Constitutional:  Negative for chills, fatigue and fever.  HENT:  Negative for congestion and ear pain.   Eyes:  Negative for pain and redness.  Respiratory:  Negative for cough and shortness of breath.   Cardiovascular:  Negative for chest pain, palpitations and leg swelling.  Gastrointestinal:  Negative for abdominal pain, blood in stool, constipation, diarrhea, nausea and vomiting.  Genitourinary:  Negative for dysuria and vaginal bleeding.   Musculoskeletal:  Negative for back pain and myalgias.  Skin:  Negative for rash.  Neurological:  Negative for dizziness, syncope, light-headedness and headaches.  Psychiatric/Behavioral:  Negative for dysphoric mood. The patient is not nervous/anxious.    Objective:  BP (!) 166/90 (BP Location: Right Arm, Patient Position: Sitting, Cuff Size: Normal)   Pulse 95   Temp 98.7 F (37.1 C) (Temporal)   Ht 5\' 1"  (1.549 m)   Wt 169 lb 6 oz (76.8 kg)   SpO2 95%   BMI 32.00 kg/m   Wt Readings from Last 3 Encounters:  10/17/22 169 lb 6 oz (76.8 kg)  09/27/22 168 lb (76.2 kg)  08/09/22 167 lb 6 oz (75.9 kg)      Physical Exam Constitutional:      General: She is not in acute distress.    Appearance: Normal appearance. She is well-developed. She is not ill-appearing or toxic-appearing.  HENT:     Head: Normocephalic.     Right Ear: Hearing, tympanic membrane, ear canal and external ear normal. Tympanic membrane is not erythematous, retracted or bulging.     Left Ear: Hearing, tympanic membrane, ear canal and external ear normal. Tympanic membrane is not erythematous, retracted or bulging.     Nose: No mucosal edema or rhinorrhea.     Right Sinus: No maxillary sinus tenderness or frontal sinus tenderness.     Left Sinus: No maxillary sinus tenderness or frontal sinus tenderness.     Comments: Swelling in bilateral turbinates    Mouth/Throat:     Pharynx: Uvula midline.  Eyes:     General: Lids are normal. Lids are everted, no foreign bodies appreciated.     Conjunctiva/sclera: Conjunctivae normal.     Pupils: Pupils are equal, round, and reactive to light.  Neck:     Thyroid: No thyroid mass or thyromegaly.     Vascular: No carotid bruit.     Trachea: Trachea normal.  Cardiovascular:     Rate and Rhythm: Normal rate and regular rhythm.     Pulses: Normal pulses.     Heart sounds: Normal heart sounds, S1 normal and S2 normal. No murmur heard.    No friction rub. No gallop.   Pulmonary:     Effort: Pulmonary effort is normal. No tachypnea or respiratory distress.     Breath sounds: Normal breath sounds. No decreased breath sounds, wheezing, rhonchi or rales.  Abdominal:     General: Bowel sounds are normal.     Palpations: Abdomen is soft.     Tenderness: There is no abdominal tenderness.  Musculoskeletal:     Cervical back: Normal range of motion and neck supple.     Lumbar back: Tenderness present. No spasms or bony tenderness. Decreased range of motion. Negative right straight  leg raise test and negative left straight leg raise test.  Skin:    General: Skin is warm and dry.     Findings: No rash.  Neurological:     Mental Status: She is alert.  Psychiatric:        Mood and Affect: Mood is not anxious or depressed.        Speech: Speech normal.        Behavior: Behavior normal. Behavior is cooperative.        Thought Content: Thought content normal.        Judgment: Judgment normal.    Diabetic foot exam: Normal inspection No skin breakdown No calluses  Normal DP pulses Normal sensation to light touch and monofilament Nails normal      Results for orders placed or performed in visit on 10/17/22  HM DIABETES FOOT EXAM  Result Value Ref Range   HM Diabetic Foot Exam done    Assessment and Plan:  Type 2 diabetes mellitus with neurological complications (HCC) Assessment & Plan: Chronic, some acute worsening given recent prednisone course.  Now per continuous she is trending back towards being in range (in the last week in range 95% of the time)  Encouraged bedtime snack Ozempic 0.5 mg injected weekly   Hypertension associated with diabetes [facility]) Assessment & Plan: Chronic, unclear reason for elevation today as was previously controlled on this medication.  Will go ahead and proceed with change to amlodipine/olmesartan 10/40 mg p.o. daily given his still with some persistent cough   Acute midline thoracic back pain Assessment &  Plan: Acute, most likely musculoskeletal. No associated urinary tract infection symptoms.  Recommended heat and gentle stretching.

## 2022-10-17 NOTE — Assessment & Plan Note (Signed)
Acute, most likely musculoskeletal. No associated urinary tract infection symptoms.  Recommended heat and gentle stretching.

## 2022-10-18 DIAGNOSIS — E113312 Type 2 diabetes mellitus with moderate nonproliferative diabetic retinopathy with macular edema, left eye: Secondary | ICD-10-CM | POA: Diagnosis not present

## 2022-10-20 ENCOUNTER — Other Ambulatory Visit: Payer: Self-pay

## 2022-11-01 DIAGNOSIS — E113511 Type 2 diabetes mellitus with proliferative diabetic retinopathy with macular edema, right eye: Secondary | ICD-10-CM | POA: Diagnosis not present

## 2022-11-01 LAB — HM DIABETES EYE EXAM

## 2022-11-06 IMAGING — US US RENAL
1 series · 15 of 25 positions shown · non-contrast
Comparison: None.

CLINICAL DATA: low back pain, proteinuria, hematuria

EXAM:
RENAL / URINARY TRACT ULTRASOUND COMPLETE

[Series 1: us renal · 15 of 32 slices shown]
[im 1/32]
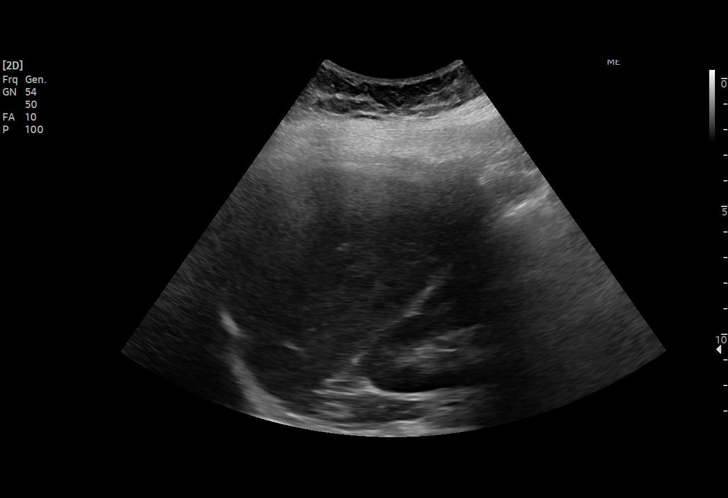
[im 3/32]
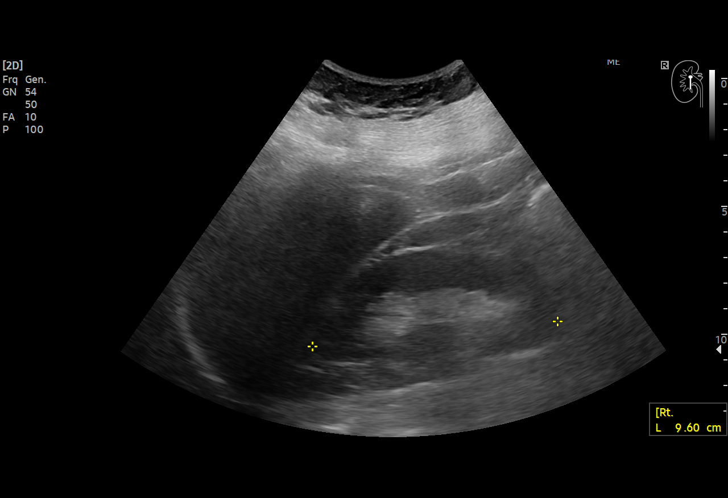
[im 6/32]
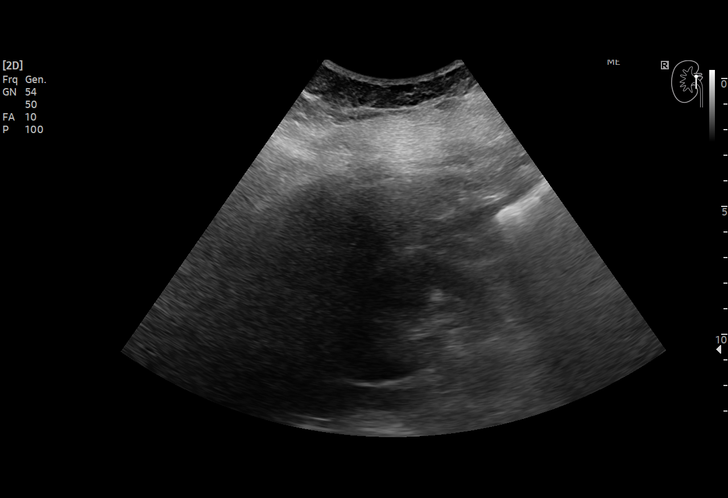
[im 7/32]
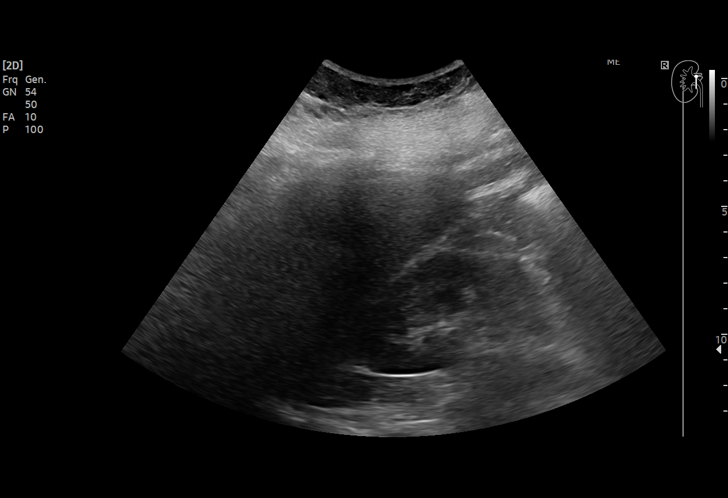
[im 10/32]
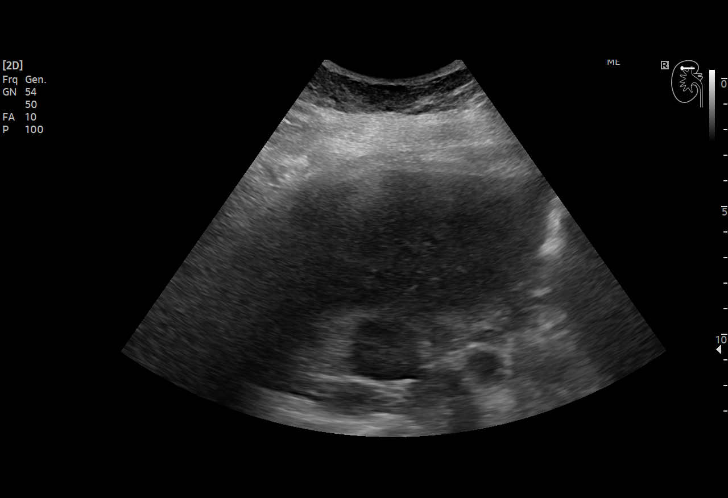
[im 12/32]
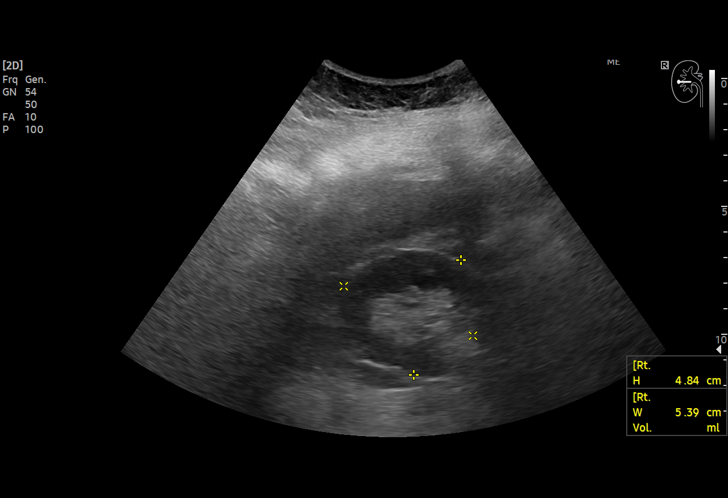
[im 13/32]
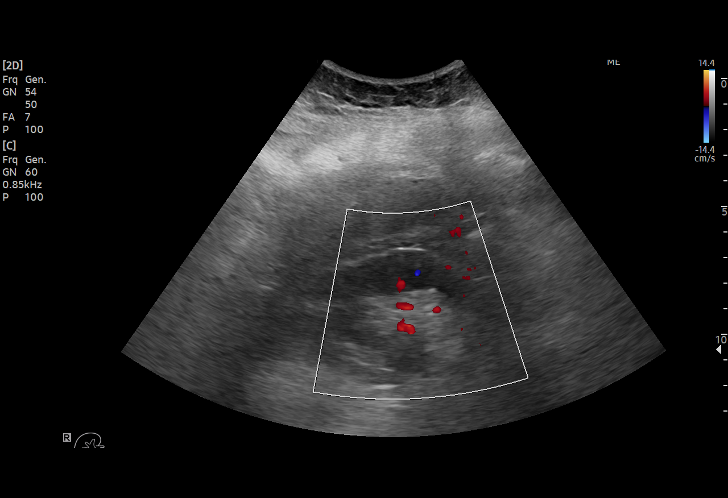
[im 16/32]
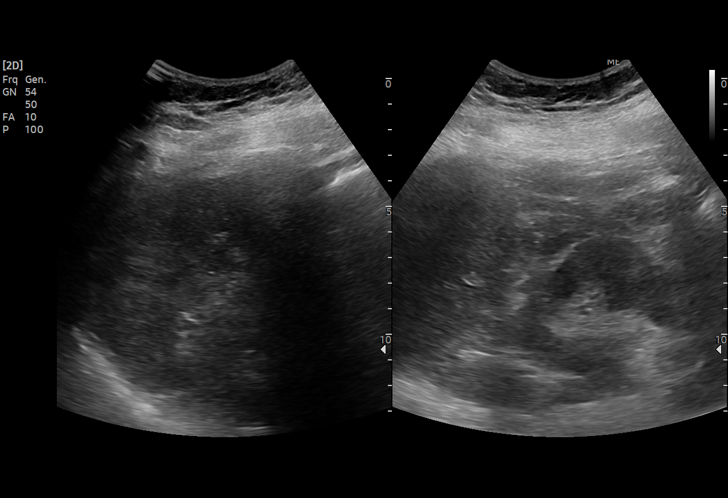
[im 19/32]
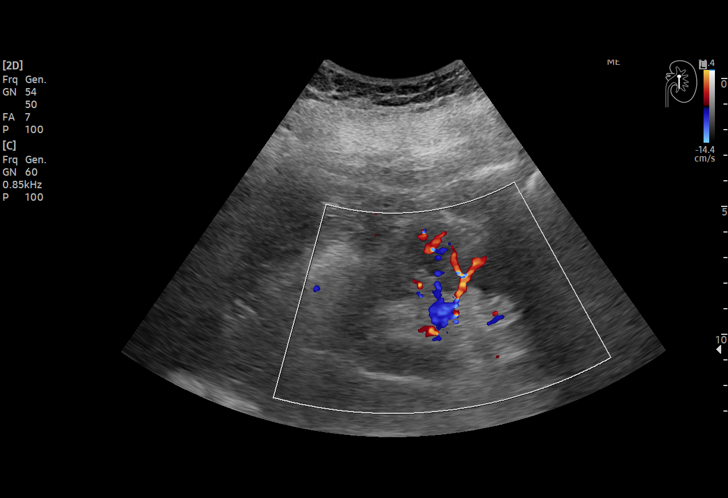
[im 20/32]
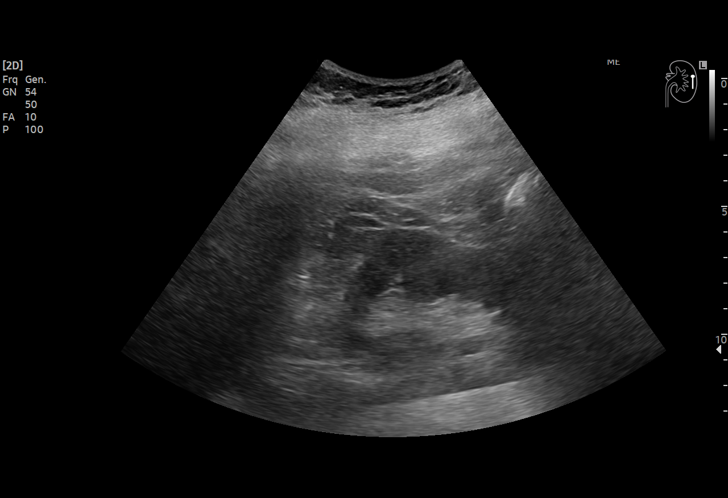
[im 22/32]
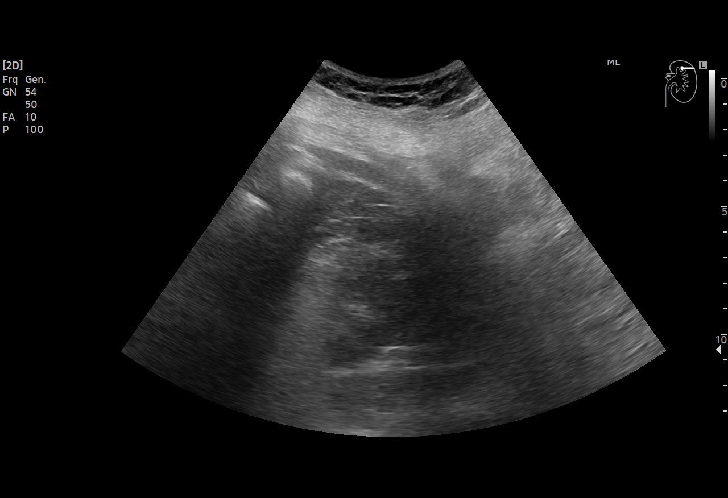
[im 25/32]
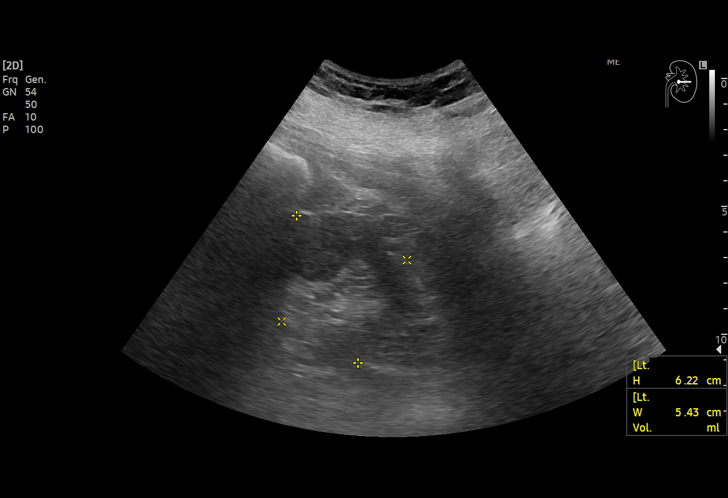
[im 26/32]
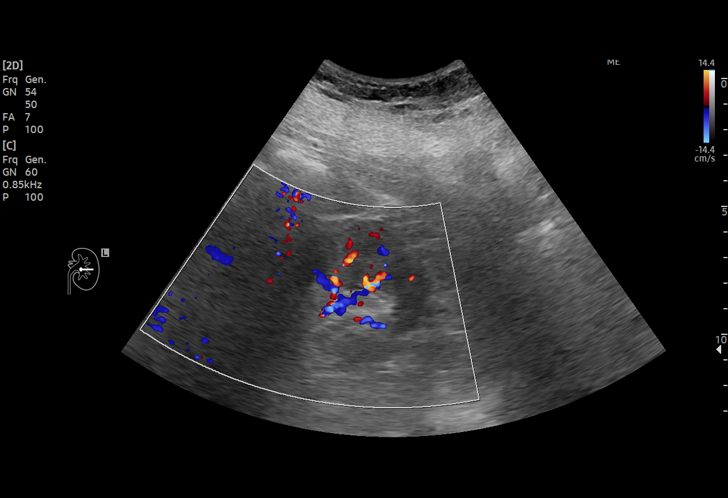
[im 29/32]
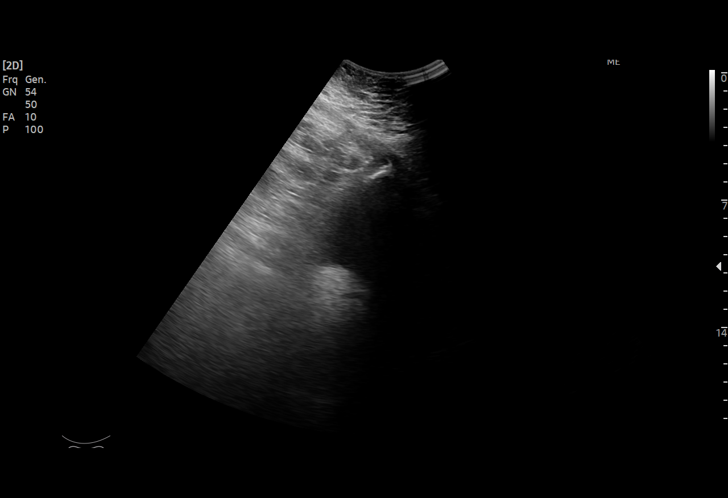
[im 32/32]
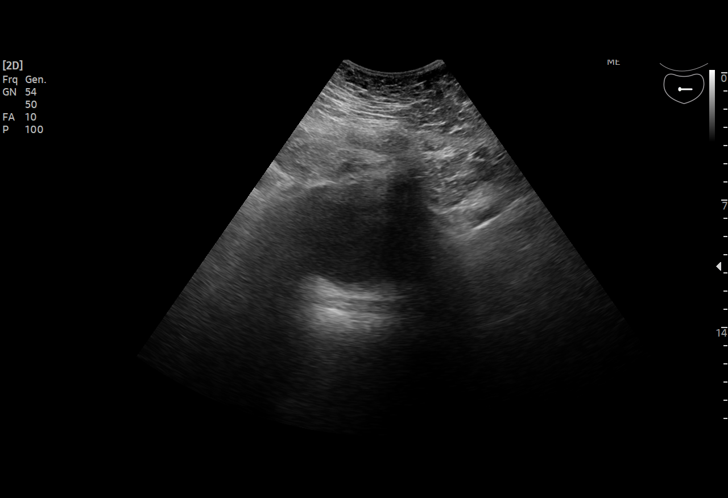

[15 of 25 positions shown; findings below may reference images not displayed]

FINDINGS: Right Kidney:

Renal measurements: 9.6 x 4.8 x 5.4 cm = volume: 131 mL.
Echogenicity within normal limits. No mass or hydronephrosis
visualized.

Left Kidney:

Renal measurements: 9.7 x 6.2 x 5.4 cm = volume: 171 mL.
Echogenicity within normal limits. No mass or hydronephrosis
visualized.

Urinary bladder:

Appears normal for degree of bladder distention.

Other:

None.
IMPRESSION: Unremarkable renal ultrasound.

## 2022-11-07 ENCOUNTER — Ambulatory Visit (INDEPENDENT_AMBULATORY_CARE_PROVIDER_SITE_OTHER): Payer: PPO | Admitting: Family Medicine

## 2022-11-07 ENCOUNTER — Encounter: Payer: Self-pay | Admitting: Family Medicine

## 2022-11-07 VITALS — BP 150/88 | HR 79 | Temp 98.6°F | Ht 61.0 in | Wt 168.0 lb

## 2022-11-07 DIAGNOSIS — E6609 Other obesity due to excess calories: Secondary | ICD-10-CM

## 2022-11-07 DIAGNOSIS — Z6831 Body mass index (BMI) 31.0-31.9, adult: Secondary | ICD-10-CM

## 2022-11-07 DIAGNOSIS — I152 Hypertension secondary to endocrine disorders: Secondary | ICD-10-CM

## 2022-11-07 DIAGNOSIS — Z7985 Long-term (current) use of injectable non-insulin antidiabetic drugs: Secondary | ICD-10-CM

## 2022-11-07 DIAGNOSIS — E1140 Type 2 diabetes mellitus with diabetic neuropathy, unspecified: Secondary | ICD-10-CM | POA: Diagnosis not present

## 2022-11-07 DIAGNOSIS — E1149 Type 2 diabetes mellitus with other diabetic neurological complication: Secondary | ICD-10-CM | POA: Diagnosis not present

## 2022-11-07 DIAGNOSIS — E66811 Obesity, class 1: Secondary | ICD-10-CM

## 2022-11-07 DIAGNOSIS — E1159 Type 2 diabetes mellitus with other circulatory complications: Secondary | ICD-10-CM | POA: Diagnosis not present

## 2022-11-07 LAB — POCT GLYCOSYLATED HEMOGLOBIN (HGB A1C): Hemoglobin A1C: 6 % — AB (ref 4.0–5.6)

## 2022-11-07 MED ORDER — SEMAGLUTIDE (1 MG/DOSE) 4 MG/3ML ~~LOC~~ SOPN: 1.0000 mg | PEN_INJECTOR | SUBCUTANEOUS | 1 refills | Status: DC

## 2022-11-07 NOTE — Assessment & Plan Note (Signed)
Chronic, stable control   Trial of ALA 600 mg daily

## 2022-11-07 NOTE — Progress Notes (Signed)
Patient ID: Patricia Horne, female    DOB: 1950/03/28, 72 y.o.   MRN: [id number]  This visit was conducted in person.  BP (!) 150/88   Pulse 79   Temp 98.6 F (37 C) (Oral)   Ht 5\' 1"  (1.549 m)   Wt 168 lb (76.2 kg)   SpO2 96%   BMI 31.74 kg/m    CC:  Chief Complaint  Patient presents with   Medical Management of Chronic Issues    Follow up on HTN and DM     Subjective:   HPI: Patricia Horne is a 72 y.o. female presenting on 11/07/2022 for Medical Management of Chronic Issues (Follow up on HTN and DM )    Hypertension:  Chronic,   Significant improvement in cough. BP Readings from Last 3 Encounters:  11/07/22 (!) 150/88  10/17/22 (!) 166/90  09/27/22 136/80  Using medication without problems or lightheadedness:  none Chest pain with exertion:none Edema: stable Short of breath:none Average home BPs:  not checking.  Diabetes:  well controlled on  semaglutide 0.5 mg weekly. Lab Results  Component Value Date   HGBA1C 6.0 (A) 11/07/2022  Using medications without difficulties: Hypoglycemic episodes: last night dropped to 59. Hyperglycemic episodes:  yes Feet problems: Blood Sugars averaging: very high when on prednisone ( in last 2 weeks 8% highs > 250), FBS in last few days  in range  95% of time eye exam within last year: yes, frequently given bleeding and diabetic retinopathy.   Minimal weight loss at this dose of ozempic.  Wt Readings from Last 3 Encounters:  11/07/22 168 lb (76.2 kg)  10/17/22 169 lb 6 oz (76.8 kg)  09/27/22 168 lb (76.2 kg)     Relevant past medical, surgical, family and social history reviewed and updated as indicated. Interim medical history since our last visit reviewed. Allergies and medications reviewed and updated. Outpatient Medications Prior to Visit  Medication Sig Dispense Refill   acetaminophen (TYLENOL) 500 MG tablet Take 500 mg by mouth every 6 (six) hours as needed.     amLODipine-olmesartan (AZOR) 10-40 MG tablet  Take 1 tablet by mouth daily. 30 tablet 11   Cholecalciferol (VITAMIN D) 50 MCG (2000 UT) CAPS Take by mouth.     Continuous Glucose Sensor (FREESTYLE LIBRE 3 SENSOR) MISC Place 1 sensor on the skin every 14 days. Use to check glucose continuously 2 each 11   guaiFENesin-codeine 100-10 MG/5ML syrup Take 5-10 mLs by mouth at bedtime as needed for cough. 180 mL 0   linaclotide (LINZESS) 145 MCG CAPS capsule Take 1 capsule (145 mcg total) by mouth daily before breakfast. 30 capsule 11   omeprazole (PRILOSEC) 20 MG capsule Take 1 capsule (20 mg total) by mouth daily. 30 capsule 3   ondansetron (ZOFRAN) 4 MG tablet Take 1 tablet (4 mg total) by mouth every 8 (eight) hours as needed for nausea or vomiting. 20 tablet 0   Semaglutide,0.25 or 0.5MG /DOS, (OZEMPIC, 0.25 OR 0.5 MG/DOSE,) 2 MG/3ML SOPN Inject 0.25 mg into the skin once a week. 3 mL 0   glucose monitoring kit (FREESTYLE) monitoring kit 1 each by Does not apply route as needed for other. 1 each 0   No facility-administered medications prior to visit.     Per HPI unless specifically indicated in ROS section below Review of Systems  Constitutional:  Negative for chills, fatigue and fever.  HENT:  Negative for congestion and ear pain.   Eyes:  Negative for  pain and redness.  Respiratory:  Negative for cough and shortness of breath.   Cardiovascular:  Negative for chest pain, palpitations and leg swelling.  Gastrointestinal:  Negative for abdominal pain, blood in stool, constipation, diarrhea, nausea and vomiting.  Genitourinary:  Negative for dysuria and vaginal bleeding.  Musculoskeletal:  Negative for back pain and myalgias.  Skin:  Negative for rash.  Neurological:  Negative for dizziness, syncope, light-headedness and headaches.  Psychiatric/Behavioral:  Negative for dysphoric mood. The patient is not nervous/anxious.    Objective:  BP (!) 150/88   Pulse 79   Temp 98.6 F (37 C) (Oral)   Ht 5\' 1"  (1.549 m)   Wt 168 lb (76.2 kg)    SpO2 96%   BMI 31.74 kg/m   Wt Readings from Last 3 Encounters:  11/07/22 168 lb (76.2 kg)  10/17/22 169 lb 6 oz (76.8 kg)  09/27/22 168 lb (76.2 kg)      Physical Exam Constitutional:      General: She is not in acute distress.    Appearance: Normal appearance. She is well-developed. She is not ill-appearing or toxic-appearing.  HENT:     Head: Normocephalic.     Right Ear: Hearing, tympanic membrane, ear canal and external ear normal. Tympanic membrane is not erythematous, retracted or bulging.     Left Ear: Hearing, tympanic membrane, ear canal and external ear normal. Tympanic membrane is not erythematous, retracted or bulging.     Nose: No mucosal edema or rhinorrhea.     Right Sinus: No maxillary sinus tenderness or frontal sinus tenderness.     Left Sinus: No maxillary sinus tenderness or frontal sinus tenderness.     Mouth/Throat:     Pharynx: Uvula midline.  Eyes:     General: Lids are normal. Lids are everted, no foreign bodies appreciated.     Conjunctiva/sclera: Conjunctivae normal.     Pupils: Pupils are equal, round, and reactive to light.  Neck:     Thyroid: No thyroid mass or thyromegaly.     Vascular: No carotid bruit.     Trachea: Trachea normal.  Cardiovascular:     Rate and Rhythm: Normal rate and regular rhythm.     Pulses: Normal pulses.     Heart sounds: Normal heart sounds, S1 normal and S2 normal. No murmur heard.    No friction rub. No gallop.  Pulmonary:     Effort: Pulmonary effort is normal. No tachypnea or respiratory distress.     Breath sounds: Normal breath sounds. No decreased breath sounds, wheezing, rhonchi or rales.  Abdominal:     General: Bowel sounds are normal.     Palpations: Abdomen is soft.     Tenderness: There is no abdominal tenderness.  Musculoskeletal:     Cervical back: Normal range of motion and neck supple.     Lumbar back: Tenderness present. No spasms or bony tenderness. Decreased range of motion. Negative right  straight leg raise test and negative left straight leg raise test.  Skin:    General: Skin is warm and dry.     Findings: No rash.  Neurological:     Mental Status: She is alert.  Psychiatric:        Mood and Affect: Mood is not anxious or depressed.        Speech: Speech normal.        Behavior: Behavior normal. Behavior is cooperative.        Thought Content: Thought content normal.  Judgment: Judgment normal.    Diabetic foot exam: Normal inspection No skin breakdown No calluses  Normal DP pulses Normal sensation to light touch and monofilament Nails normal      Results for orders placed or performed in visit on 11/07/22  POCT glycosylated hemoglobin (Hb A1C)  Result Value Ref Range   Hemoglobin A1C 6.0 (A) 4.0 - 5.6 %   HbA1c POC (<> result, manual entry)     HbA1c, POC (prediabetic range)     HbA1c, POC (controlled diabetic range)     Assessment and Plan:  Type 2 diabetes mellitus with neurological complications (HCC) Assessment & Plan: Chronic,  well controlled diabetes but given minimal weight loss, will increase to 1 mg weekly.  Semaglutide 1 mg weekly  Orders: -     POCT glycosylated hemoglobin (Hb A1C)  Hypertension associated with diabetes (HCC) Assessment & Plan: Chronic,  inadequate control in office.. will follow BPs at home. Weight loss and diet changes encouraged to help with  HTN control.   amlodipine/olmesartan 10/40 mg p.o. daily... minimal associated cough   Neuropathy due to type 2 diabetes mellitus (HCC) Assessment & Plan: Chronic, stable control   Trial of ALA 600 mg daily   Class 1 obesity due to excess calories with serious comorbidity and body mass index (BMI) of 31.0 to 31.9 in adult Assessment & Plan: Encouraged exercise, weight loss, healthy eating habits. Increase  semaglutide to 1 mg weekly.   Other orders -     Semaglutide (1 MG/DOSE); Inject 1 mg as directed once a week.  Dispense: 9 mL; Refill: 1

## 2022-11-07 NOTE — Assessment & Plan Note (Addendum)
Chronic,  inadequate control in office.. will follow BPs at home. Weight loss and diet changes encouraged to help with  HTN control.   amlodipine/olmesartan 10/40 mg p.o. daily... minimal associated cough

## 2022-11-07 NOTE — Assessment & Plan Note (Addendum)
Chronic,  well controlled diabetes but given minimal weight loss, will increase to 1 mg weekly.  Semaglutide 1 mg weekly

## 2022-11-07 NOTE — Patient Instructions (Signed)
Follow Bp at home on new medication... call if consistently > 140/90.

## 2022-11-07 NOTE — Assessment & Plan Note (Signed)
Encouraged exercise, weight loss, healthy eating habits. Increase  semaglutide to 1 mg weekly.

## 2022-11-08 ENCOUNTER — Other Ambulatory Visit: Payer: Self-pay

## 2022-11-22 ENCOUNTER — Encounter: Payer: Self-pay | Admitting: Pharmacist

## 2022-11-28 ENCOUNTER — Other Ambulatory Visit: Payer: Self-pay

## 2022-11-28 ENCOUNTER — Telehealth: Payer: Self-pay | Admitting: Family Medicine

## 2022-11-28 DIAGNOSIS — K5900 Constipation, unspecified: Secondary | ICD-10-CM | POA: Diagnosis not present

## 2022-11-28 DIAGNOSIS — Z860101 Personal history of adenomatous and serrated colon polyps: Secondary | ICD-10-CM | POA: Diagnosis not present

## 2022-11-28 MED ORDER — PEG 3350-KCL-NA BICARB-NACL 420 G PO SOLR
ORAL | 0 refills | Status: DC
  Filled 2022-11-28: qty 4000, 1d supply, fill #0

## 2022-11-28 MED ORDER — LINZESS 72 MCG PO CAPS
72.0000 ug | ORAL_CAPSULE | Freq: Every day | ORAL | 3 refills | Status: AC
  Filled 2022-11-28: qty 30, 30d supply, fill #0

## 2022-11-28 NOTE — Telephone Encounter (Signed)
Received from [employer]:  Ozempic 4 mg/73ml Lot# [id number] Exp: 07/29/2025 x 4 pens.  Irais notified by telephone that medication is here at the office for pick-up.

## 2022-11-28 NOTE — Telephone Encounter (Signed)
Patient picked up medication

## 2022-12-13 DIAGNOSIS — E113312 Type 2 diabetes mellitus with moderate nonproliferative diabetic retinopathy with macular edema, left eye: Secondary | ICD-10-CM | POA: Diagnosis not present

## 2022-12-19 ENCOUNTER — Ambulatory Visit (INDEPENDENT_AMBULATORY_CARE_PROVIDER_SITE_OTHER): Payer: PPO | Admitting: Family Medicine

## 2022-12-19 ENCOUNTER — Encounter: Payer: Self-pay | Admitting: Family Medicine

## 2022-12-19 VITALS — BP 130/70 | HR 85 | Temp 98.6°F | Ht 61.0 in | Wt 174.1 lb

## 2022-12-19 DIAGNOSIS — I152 Hypertension secondary to endocrine disorders: Secondary | ICD-10-CM

## 2022-12-19 DIAGNOSIS — E66811 Obesity, class 1: Secondary | ICD-10-CM | POA: Diagnosis not present

## 2022-12-19 DIAGNOSIS — Z6831 Body mass index (BMI) 31.0-31.9, adult: Secondary | ICD-10-CM | POA: Diagnosis not present

## 2022-12-19 DIAGNOSIS — E6609 Other obesity due to excess calories: Secondary | ICD-10-CM | POA: Diagnosis not present

## 2022-12-19 DIAGNOSIS — Z7985 Long-term (current) use of injectable non-insulin antidiabetic drugs: Secondary | ICD-10-CM

## 2022-12-19 DIAGNOSIS — E1149 Type 2 diabetes mellitus with other diabetic neurological complication: Secondary | ICD-10-CM

## 2022-12-19 DIAGNOSIS — E1159 Type 2 diabetes mellitus with other circulatory complications: Secondary | ICD-10-CM | POA: Diagnosis not present

## 2022-12-19 LAB — MICROALBUMIN / CREATININE URINE RATIO
Creatinine,U: 179.3 mg/dL
Microalb Creat Ratio: 24 mg/g (ref 0.0–30.0)
Microalb, Ur: 43 mg/dL — ABNORMAL HIGH (ref 0.0–1.9)

## 2022-12-19 NOTE — Assessment & Plan Note (Signed)
Chronic,   Improved  control in office..  she was compliant with her meds this morning. Weight loss and diet changes encouraged to help with  HTN control.   amlodipine/olmesartan 10/40 mg p.o. daily... minimal associated cough

## 2022-12-19 NOTE — Assessment & Plan Note (Signed)
Chronic,  well controlled diabetes but given minimal weight loss, will increase to 1 mg weekly.  Semaglutide 1 mg weekly

## 2022-12-19 NOTE — Assessment & Plan Note (Signed)
No improvement with semaglutide increase1 mg weekly.... We reviewed correct method of administering medication.  We did discuss lifestyle changes that need to be used along with semaglutide for weight loss.  She will work on exercise and diet modification.

## 2022-12-19 NOTE — Progress Notes (Signed)
Patient ID: Patricia Horne, female    DOB: 09-18-1950, 72 y.o.   MRN: [id number]  This visit was conducted in person.  BP 130/70 (BP Location: Left Arm, Patient Position: Sitting, Cuff Size: Normal)   Pulse 85   Temp 98.6 F (37 C) (Temporal)   Ht 5\' 1"  (1.549 m)   Wt 174 lb 2 oz (79 kg)   SpO2 98%   BMI 32.90 kg/m    CC:  Chief Complaint  Patient presents with   Diabetes    Subjective:   HPI: Patricia Horne is a 72 y.o. female presenting on 12/19/2022 for Diabetes    Hypertension:   well controlled,  amlodipine/olmesartan  10/40 mg daily BP Readings from Last 3 Encounters:  12/19/22 130/70  11/07/22 (!) 150/88  10/17/22 (!) 166/90  Using medication without problems or lightheadedness:  none Chest pain with exertion:none Edema: stable Short of breath:none Average home BPs:  not checking.  Diabetes:  well controlled on  semaglutide 1 mg weekly... now on this dose for 1 month Lab Results  Component Value Date   HGBA1C 6.0 (A) 11/07/2022  Using medications without difficulties: Hypoglycemic episodes:  no recent lows Hyperglycemic episodes:  yes Feet problems: no ulcer Blood Sugars averaging:   FBS 110-120 , one 300 after poor meal eye exam within last year: yes, frequently given bleeding and diabetic retinopathy.  Semaglutide 1 mg  weekly  Wt Readings from Last 3 Encounters:  12/19/22 174 lb 2 oz (79 kg)  11/07/22 168 lb (76.2 kg)  10/17/22 169 lb 6 oz (76.8 kg)     Relevant past medical, surgical, family and social history reviewed and updated as indicated. Interim medical history since our last visit reviewed. Allergies and medications reviewed and updated. Outpatient Medications Prior to Visit  Medication Sig Dispense Refill   acetaminophen (TYLENOL) 500 MG tablet Take 500 mg by mouth every 6 (six) hours as needed.     amLODipine-olmesartan (AZOR) 10-40 MG tablet Take 1 tablet by mouth daily. 30 tablet 11   Cholecalciferol (VITAMIN D) 50 MCG  (2000 UT) CAPS Take by mouth.     Continuous Glucose Sensor (FREESTYLE LIBRE 3 SENSOR) MISC Place 1 sensor on the skin every 14 days. Use to check glucose continuously 2 each 11   linaclotide (LINZESS) 72 MCG capsule Take 1 capsule (72 mcg total) by mouth daily. 30 capsule 3   omeprazole (PRILOSEC) 20 MG capsule Take 1 capsule (20 mg total) by mouth daily. 30 capsule 3   ondansetron (ZOFRAN) 4 MG tablet Take 1 tablet (4 mg total) by mouth every 8 (eight) hours as needed for nausea or vomiting. 20 tablet 0   Semaglutide, 1 MG/DOSE, 4 MG/3ML SOPN Inject 1 mg as directed once a week. 9 mL 1   VITAMIN E PO Take 1 tablet by mouth daily.     polyethylene glycol-electrolytes (NULYTELY) 420 g solution Prepare kit as directed and take by mouth as one dose. (Patient not taking: Reported on 12/19/2022) 4000 mL 0   guaiFENesin-codeine 100-10 MG/5ML syrup Take 5-10 mLs by mouth at bedtime as needed for cough. 180 mL 0   linaclotide (LINZESS) 145 MCG CAPS capsule Take 1 capsule (145 mcg total) by mouth daily before breakfast. 30 capsule 11   No facility-administered medications prior to visit.     Per HPI unless specifically indicated in ROS section below Review of Systems  Constitutional:  Negative for chills, fatigue and fever.  HENT:  Negative for  congestion and ear pain.   Eyes:  Negative for pain and redness.  Respiratory:  Negative for cough and shortness of breath.   Cardiovascular:  Negative for chest pain, palpitations and leg swelling.  Gastrointestinal:  Negative for abdominal pain, blood in stool, constipation, diarrhea, nausea and vomiting.  Genitourinary:  Negative for dysuria and vaginal bleeding.  Musculoskeletal:  Negative for back pain and myalgias.  Skin:  Negative for rash.  Neurological:  Negative for dizziness, syncope, light-headedness and headaches.  Psychiatric/Behavioral:  Negative for dysphoric mood. The patient is not nervous/anxious.    Objective:  BP 130/70 (BP Location:  Left Arm, Patient Position: Sitting, Cuff Size: Normal)   Pulse 85   Temp 98.6 F (37 C) (Temporal)   Ht 5\' 1"  (1.549 m)   Wt 174 lb 2 oz (79 kg)   SpO2 98%   BMI 32.90 kg/m   Wt Readings from Last 3 Encounters:  12/19/22 174 lb 2 oz (79 kg)  11/07/22 168 lb (76.2 kg)  10/17/22 169 lb 6 oz (76.8 kg)      Physical Exam Constitutional:      General: She is not in acute distress.    Appearance: Normal appearance. She is well-developed. She is not ill-appearing or toxic-appearing.  HENT:     Head: Normocephalic.     Right Ear: Hearing, tympanic membrane, ear canal and external ear normal. Tympanic membrane is not erythematous, retracted or bulging.     Left Ear: Hearing, tympanic membrane, ear canal and external ear normal. Tympanic membrane is not erythematous, retracted or bulging.     Nose: No mucosal edema or rhinorrhea.     Right Sinus: No maxillary sinus tenderness or frontal sinus tenderness.     Left Sinus: No maxillary sinus tenderness or frontal sinus tenderness.     Mouth/Throat:     Pharynx: Uvula midline.  Eyes:     General: Lids are normal. Lids are everted, no foreign bodies appreciated.     Conjunctiva/sclera: Conjunctivae normal.     Pupils: Pupils are equal, round, and reactive to light.  Neck:     Thyroid: No thyroid mass or thyromegaly.     Vascular: No carotid bruit.     Trachea: Trachea normal.  Cardiovascular:     Rate and Rhythm: Normal rate and regular rhythm.     Pulses: Normal pulses.     Heart sounds: Normal heart sounds, S1 normal and S2 normal. No murmur heard.    No friction rub. No gallop.  Pulmonary:     Effort: Pulmonary effort is normal. No tachypnea or respiratory distress.     Breath sounds: Normal breath sounds. No decreased breath sounds, wheezing, rhonchi or rales.  Abdominal:     General: Bowel sounds are normal.     Palpations: Abdomen is soft.     Tenderness: There is no abdominal tenderness.  Musculoskeletal:     Cervical back:  Normal range of motion and neck supple.     Lumbar back: Tenderness present. No spasms or bony tenderness. Decreased range of motion. Negative right straight leg raise test and negative left straight leg raise test.  Skin:    General: Skin is warm and dry.     Findings: No rash.  Neurological:     Mental Status: She is alert.  Psychiatric:        Mood and Affect: Mood is not anxious or depressed.        Speech: Speech normal.  Behavior: Behavior normal. Behavior is cooperative.        Thought Content: Thought content normal.        Judgment: Judgment normal.         Results for orders placed or performed in visit on 11/07/22  HM DIABETES EYE EXAM  Result Value Ref Range   HM Diabetic Eye Exam Retinopathy (A) No Retinopathy   Assessment and Plan:  Type 2 diabetes mellitus with neurological complications (HCC) Assessment & Plan: Chronic,  well controlled diabetes but given minimal weight loss, will increase to 1 mg weekly.  Semaglutide 1 mg weekly  Orders: -     Microalbumin / creatinine urine ratio  Hypertension associated with diabetes (HCC) Assessment & Plan: Chronic,   Improved  control in office..  she was compliant with her meds this morning. Weight loss and diet changes encouraged to help with  HTN control.   amlodipine/olmesartan 10/40 mg p.o. daily... minimal associated cough   Class 1 obesity due to excess calories with serious comorbidity and body mass index (BMI) of 31.0 to 31.9 in adult Assessment & Plan: No improvement with semaglutide increase1 mg weekly.... We reviewed correct method of administering medication.  We did discuss lifestyle changes that need to be used along with semaglutide for weight loss.  She will work on exercise and diet modification.

## 2022-12-20 ENCOUNTER — Encounter: Payer: Self-pay | Admitting: Pharmacist

## 2022-12-20 NOTE — Progress Notes (Signed)
Manufacturer Assistance Program (MAP) Application   Manufacturer: [employer]    (Re-enrollment) Medication(s): Ozempic  Patient Portion of Application:  11/20: Completed with patient via online enrollment tool.  Income Documentation: N/A - Electronic verification elected.  Provider Portion of Application:  11/20: Provider page completed for Ozempic 1.0 mg dose. Placed in PCP inbox for signature with instruction to fax to [employer] upon completion and to upload to pt chart.  12/21/22: Form faxed to [employer] and uploaded to Media Tab 12/20: Provider pages faxed to [facility] for MD signature again. Unclear why [employer] is reporting missing HCP information. Form in media tab has all necessary components.  Prescription(s): Included in MAP application.   Application Status: Submitted via fax (pending approval) As of 12/19, [employer] re-enrollment has not been approved. Reason provided: Missing HCP pages  Next Steps: []    PCP signature []    Upon signature(s) Application + insurance card to be faxed to [employer] Fax: [phone] AND scanned into patient chart []    Fax confirmation of approval received  Forwarded to [facility] Patient Advocate Team for future correspondences/re-enrollment.  Note routed to PCP Clinic Pool to ensure PCP signature is obtained and application is faxed.  *[facility] clinic team - Please Addend/update this note as the "Next Steps" are completed in office*    Loree Fee, PharmD Clinical Pharmacist [facility] [phone]

## 2022-12-27 DIAGNOSIS — Z0100 Encounter for examination of eyes and vision without abnormal findings: Secondary | ICD-10-CM | POA: Diagnosis not present

## 2022-12-27 DIAGNOSIS — E113312 Type 2 diabetes mellitus with moderate nonproliferative diabetic retinopathy with macular edema, left eye: Secondary | ICD-10-CM | POA: Diagnosis not present

## 2023-01-16 ENCOUNTER — Ambulatory Visit: Payer: PPO | Admitting: Family Medicine

## 2023-01-16 ENCOUNTER — Encounter: Payer: Self-pay | Admitting: Family Medicine

## 2023-01-16 VITALS — BP 132/70 | HR 88 | Temp 98.4°F | Ht 61.0 in | Wt 172.0 lb

## 2023-01-16 DIAGNOSIS — E1149 Type 2 diabetes mellitus with other diabetic neurological complication: Secondary | ICD-10-CM | POA: Diagnosis not present

## 2023-01-16 DIAGNOSIS — E6609 Other obesity due to excess calories: Secondary | ICD-10-CM

## 2023-01-16 DIAGNOSIS — I152 Hypertension secondary to endocrine disorders: Secondary | ICD-10-CM | POA: Diagnosis not present

## 2023-01-16 DIAGNOSIS — E1159 Type 2 diabetes mellitus with other circulatory complications: Secondary | ICD-10-CM | POA: Diagnosis not present

## 2023-01-16 DIAGNOSIS — G8929 Other chronic pain: Secondary | ICD-10-CM | POA: Diagnosis not present

## 2023-01-16 DIAGNOSIS — Z6831 Body mass index (BMI) 31.0-31.9, adult: Secondary | ICD-10-CM

## 2023-01-16 DIAGNOSIS — M545 Low back pain, unspecified: Secondary | ICD-10-CM

## 2023-01-16 DIAGNOSIS — Z7984 Long term (current) use of oral hypoglycemic drugs: Secondary | ICD-10-CM

## 2023-01-16 DIAGNOSIS — E66811 Obesity, class 1: Secondary | ICD-10-CM | POA: Diagnosis not present

## 2023-01-16 NOTE — Assessment & Plan Note (Signed)
Encouraged exercise, weight loss, healthy eating habits. ? ?

## 2023-01-16 NOTE — Assessment & Plan Note (Signed)
Chronic,  well controlled diabetes but given minimal weight loss, will increase to 1 mg weekly.  Semaglutide 1 mg weekly

## 2023-01-16 NOTE — Assessment & Plan Note (Signed)
 Chronic,   Improved  control in office..  she was compliant with her meds this morning. Weight loss and diet changes encouraged to help with  HTN control.   amlodipine/olmesartan 10/40 mg p.o. daily... minimal associated cough

## 2023-01-16 NOTE — Progress Notes (Signed)
Patient ID: Patricia Horne, female    DOB: [birth year], 72 y.o.   MRN: [id number]  This visit was conducted in person.  BP 132/70 (BP Location: Right Arm, Patient Position: Sitting, Cuff Size: Large)   Pulse 88   Temp 98.4 F (36.9 C) (Temporal)   Ht 5\' 1"  (1.549 m)   Wt 172 lb (78 kg)   SpO2 96%   BMI 32.50 kg/m    CC:  Chief Complaint  Patient presents with   Diabetes   Back Pain    Lower Back    Subjective:   HPI: Patricia Horne is a 72 y.o. female presenting on 01/16/2023 for Diabetes and Back Pain (Lower Back)   Today she reports significant low back pain. Can only stand for a few moinutes over sink before she has to sit down. No radiation of pain to legs. No new numbness or weakness. No new falls History of chronic low back pain, sent to physical therapy in the past. December 2022 lumbar spine film showed mild degenerative changes.   Hypertension:   well controlled,  amlodipine/olmesartan  10/40 mg daily BP Readings from Last 3 Encounters:  01/16/23 132/70  12/19/22 130/70  11/07/22 (!) 150/88  Using medication without problems or lightheadedness:  none Chest pain with exertion:none Edema: stable Short of breath:none Average home BPs:  not checking.  Diabetes:  well controlled on  semaglutide 1 mg weekly... now on this dose for 1 month Lab Results  Component Value Date   HGBA1C 6.0 (A) 11/07/2022  Using medications without difficulties: Hypoglycemic episodes:  no recent lows Hyperglycemic episodes:  yes Feet problems: no ulcer Blood Sugars averaging:    Just put on CGM new this AM.. FBS 8097 I said I do not want to go no way I do not want to do that I just want to make eye exam within last year: yes, frequently given bleeding and diabetic retinopathy.  Semaglutide 1 mg  weekly  Wt Readings from Last 3 Encounters:  01/16/23 172 lb (78 kg)  12/19/22 174 lb 2 oz (79 kg)  11/07/22 168 lb (76.2 kg)     Relevant past medical, surgical, family  and social history reviewed and updated as indicated. Interim medical history since our last visit reviewed. Allergies and medications reviewed and updated. Outpatient Medications Prior to Visit  Medication Sig Dispense Refill   acetaminophen (TYLENOL) 500 MG tablet Take 500 mg by mouth every 6 (six) hours as needed.     amLODipine-olmesartan (AZOR) 10-40 MG tablet Take 1 tablet by mouth daily. 30 tablet 11   Cholecalciferol (VITAMIN D) 50 MCG (2000 UT) CAPS Take by mouth.     Continuous Glucose Sensor (FREESTYLE LIBRE 3 SENSOR) MISC Place 1 sensor on the skin every 14 days. Use to check glucose continuously 2 each 11   cyanocobalamin (VITAMIN B12) 1000 MCG tablet Take 1,000 mcg by mouth daily.     linaclotide (LINZESS) 72 MCG capsule Take 1 capsule (72 mcg total) by mouth daily. 30 capsule 3   omeprazole (PRILOSEC) 20 MG capsule Take 1 capsule (20 mg total) by mouth daily. 30 capsule 3   ondansetron (ZOFRAN) 4 MG tablet Take 1 tablet (4 mg total) by mouth every 8 (eight) hours as needed for nausea or vomiting. 20 tablet 0   polyethylene glycol-electrolytes (NULYTELY) 420 g solution Prepare kit as directed and take by mouth as one dose. 4000 mL 0   Semaglutide, 1 MG/DOSE, 4 MG/3ML SOPN Inject 1  mg as directed once a week. 9 mL 1   VITAMIN E PO Take 1 tablet by mouth daily.     No facility-administered medications prior to visit.     Per HPI unless specifically indicated in ROS section below Review of Systems  Constitutional:  Negative for chills, fatigue and fever.  HENT:  Negative for congestion and ear pain.   Eyes:  Negative for pain and redness.  Respiratory:  Negative for cough and shortness of breath.   Cardiovascular:  Negative for chest pain, palpitations and leg swelling.  Gastrointestinal:  Negative for abdominal pain, blood in stool, constipation, diarrhea, nausea and vomiting.  Genitourinary:  Negative for dysuria and vaginal bleeding.  Musculoskeletal:  Negative for back pain  and myalgias.  Skin:  Negative for rash.  Neurological:  Negative for dizziness, syncope, light-headedness and headaches.  Psychiatric/Behavioral:  Negative for dysphoric mood. The patient is not nervous/anxious.    Objective:  BP 132/70 (BP Location: Right Arm, Patient Position: Sitting, Cuff Size: Large)   Pulse 88   Temp 98.4 F (36.9 C) (Temporal)   Ht 5\' 1"  (1.549 m)   Wt 172 lb (78 kg)   SpO2 96%   BMI 32.50 kg/m   Wt Readings from Last 3 Encounters:  01/16/23 172 lb (78 kg)  12/19/22 174 lb 2 oz (79 kg)  11/07/22 168 lb (76.2 kg)      Physical Exam Constitutional:      General: She is not in acute distress.    Appearance: Normal appearance. She is well-developed. She is not ill-appearing or toxic-appearing.  HENT:     Head: Normocephalic.     Right Ear: Hearing, tympanic membrane, ear canal and external ear normal. Tympanic membrane is not erythematous, retracted or bulging.     Left Ear: Hearing, tympanic membrane, ear canal and external ear normal. Tympanic membrane is not erythematous, retracted or bulging.     Nose: No mucosal edema or rhinorrhea.     Right Sinus: No maxillary sinus tenderness or frontal sinus tenderness.     Left Sinus: No maxillary sinus tenderness or frontal sinus tenderness.     Mouth/Throat:     Pharynx: Uvula midline.  Eyes:     General: Lids are normal. Lids are everted, no foreign bodies appreciated.     Conjunctiva/sclera: Conjunctivae normal.     Pupils: Pupils are equal, round, and reactive to light.  Neck:     Thyroid: No thyroid mass or thyromegaly.     Vascular: No carotid bruit.     Trachea: Trachea normal.  Cardiovascular:     Rate and Rhythm: Normal rate and regular rhythm.     Pulses: Normal pulses.     Heart sounds: Normal heart sounds, S1 normal and S2 normal. No murmur heard.    No friction rub. No gallop.  Pulmonary:     Effort: Pulmonary effort is normal. No tachypnea or respiratory distress.     Breath sounds: Normal  breath sounds. No decreased breath sounds, wheezing, rhonchi or rales.  Abdominal:     General: Bowel sounds are normal.     Palpations: Abdomen is soft.     Tenderness: There is no abdominal tenderness.  Musculoskeletal:     Cervical back: Normal range of motion and neck supple.     Lumbar back: Tenderness present. No spasms or bony tenderness. Decreased range of motion. Negative right straight leg raise test and negative left straight leg raise test.  Skin:    General:  Skin is warm and dry.     Findings: No rash.  Neurological:     Mental Status: She is alert.  Psychiatric:        Mood and Affect: Mood is not anxious or depressed.        Speech: Speech normal.        Behavior: Behavior normal. Behavior is cooperative.        Thought Content: Thought content normal.        Judgment: Judgment normal.         Results for orders placed or performed in visit on 12/19/22  Microalbumin / creatinine urine ratio   Collection Time: 12/19/22 11:48 AM  Result Value Ref Range   Microalb, Ur 43.0 (H) 0.0 - 1.9 mg/dL   Creatinine,U 284.1 mg/dL   Microalb Creat Ratio 24.0 0.0 - 30.0 mg/g   Assessment and Plan:  Type 2 diabetes mellitus with neurological complications (HCC) Assessment & Plan: Chronic,  well controlled diabetes but given minimal weight loss, will increase to 1 mg weekly.  Semaglutide 1 mg weekly   Hypertension associated with diabetes (HCC) Assessment & Plan: Chronic,   Improved  control in office..  she was compliant with her meds this morning. Weight loss and diet changes encouraged to help with  HTN control.   amlodipine/olmesartan 10/40 mg p.o. daily... minimal associated cough   Class 1 obesity due to excess calories with serious comorbidity and body mass index (BMI) of 31.0 to 31.9 in adult Assessment & Plan: Encouraged exercise, weight loss, healthy eating habits.    Chronic midline low back pain without sciatica Assessment & Plan:  Chronic, no acute  flare or new injury.  Continue lidocaine patches, home PT and weight management.  Offered other treatments and eval.. pt not interested at this time.

## 2023-01-16 NOTE — Assessment & Plan Note (Signed)
Chronic, no acute flare or new injury.  Continue lidocaine patches, home PT and weight management.  Offered other treatments and eval.. pt not interested at this time.

## 2023-01-18 ENCOUNTER — Telehealth: Payer: Self-pay

## 2023-01-18 NOTE — Telephone Encounter (Signed)
Copied from CRM [id number]. Topic: Clinical - Medical Advice >> Jan 18, 2023  9:32 AM Elizebeth Brooking wrote: Reason for CRM: Patient stated to ask Dr and/Or Nurse to resubmitted application for next year regarding ozempic medication

## 2023-01-18 NOTE — Telephone Encounter (Signed)
Patient last seen 07/04/22. Please advise

## 2023-01-19 NOTE — Telephone Encounter (Signed)
See phone note 12/202/2024 from [employer].

## 2023-01-19 NOTE — Progress Notes (Unsigned)
[employer] PAP application placed in Dr. Daphine Deutscher office for signature.

## 2023-01-22 NOTE — Progress Notes (Signed)
PAP application signed by Dr. Ermalene Searing and faxed to [employer] at [phone].

## 2023-02-08 ENCOUNTER — Encounter: Payer: Self-pay | Admitting: Gastroenterology

## 2023-02-13 ENCOUNTER — Ambulatory Visit: Payer: PPO | Admitting: Family Medicine

## 2023-02-13 VITALS — BP 132/72 | HR 86 | Temp 98.5°F | Ht 61.0 in | Wt 171.0 lb

## 2023-02-13 DIAGNOSIS — Z7985 Long-term (current) use of injectable non-insulin antidiabetic drugs: Secondary | ICD-10-CM

## 2023-02-13 DIAGNOSIS — I152 Hypertension secondary to endocrine disorders: Secondary | ICD-10-CM

## 2023-02-13 DIAGNOSIS — E1149 Type 2 diabetes mellitus with other diabetic neurological complication: Secondary | ICD-10-CM | POA: Diagnosis not present

## 2023-02-13 DIAGNOSIS — E1159 Type 2 diabetes mellitus with other circulatory complications: Secondary | ICD-10-CM

## 2023-02-13 DIAGNOSIS — E13319 Other specified diabetes mellitus with unspecified diabetic retinopathy without macular edema: Secondary | ICD-10-CM

## 2023-02-13 LAB — POCT GLYCOSYLATED HEMOGLOBIN (HGB A1C): Hemoglobin A1C: 6.1 % — AB (ref 4.0–5.6)

## 2023-02-13 NOTE — Assessment & Plan Note (Signed)
 Chronic,   Improved  control in office..  Weight loss and diet changes encouraged to help with  HTN control.   amlodipine/olmesartan 10/40 mg p.o. daily... minimal associated cough

## 2023-02-13 NOTE — Assessment & Plan Note (Signed)
Followed by opthamology.

## 2023-02-13 NOTE — Assessment & Plan Note (Signed)
 Chronic,  well controlled diabetes, slight gradual weight loss Semaglutide 1 mg weekly

## 2023-02-13 NOTE — Progress Notes (Signed)
 Patient ID: Patricia Horne, female    DOB: 06/15/1950, 73 y.o.   MRN: [id number]  This visit was conducted in person.  BP 132/72 (BP Location: Left Arm, Patient Position: Sitting, Cuff Size: Large)   Pulse 86   Temp 98.5 F (36.9 C) (Temporal)   Ht 5' 1 (1.549 m)   Wt 171 lb (77.6 kg)   SpO2 96%   BMI 32.31 kg/m    CC:  Chief Complaint  Patient presents with   Diabetes    Subjective:   HPI: Patricia Horne is a 73 y.o. female presenting on 02/13/2023 for Diabetes   Scheduled for colonoscopy next week.  Hypertension:   well controlled,  amlodipine /olmesartan   10/40 mg daily BP Readings from Last 3 Encounters:  02/13/23 132/72  01/16/23 132/70  12/19/22 130/70  Using medication without problems or lightheadedness:  none Chest pain with exertion:none Edema: stable Short of breath:none Average home BPs:  not checking.  Diabetes:  well controlled on  semaglutide  1 mg weekly. Lab Results  Component Value Date   HGBA1C 6.1 (A) 02/13/2023  Using medications without difficulties: Hypoglycemic episodes:  no recent lows Hyperglycemic episodes:  yes Feet problems: no ulcer Blood Sugars averaging:     not checking or wearing CGM lately Semaglutide  1 mg  weekly  Wt Readings from Last 3 Encounters:  02/13/23 171 lb (77.6 kg)  01/16/23 172 lb (78 kg)  12/19/22 174 lb 2 oz (79 kg)     Relevant past medical, surgical, family and social history reviewed and updated as indicated. Interim medical history since our last visit reviewed. Allergies and medications reviewed and updated. Outpatient Medications Prior to Visit  Medication Sig Dispense Refill   acetaminophen  (TYLENOL ) 500 MG tablet Take 500 mg by mouth every 6 (six) hours as needed.     amLODipine -olmesartan  (AZOR ) 10-40 MG tablet Take 1 tablet by mouth daily. 30 tablet 11   Cholecalciferol  (VITAMIN D ) 50 MCG (2000 UT) CAPS Take by mouth.     Continuous Glucose Sensor (FREESTYLE LIBRE 3 SENSOR) MISC Place 1  sensor on the skin every 14 days. Use to check glucose continuously 2 each 11   cyanocobalamin  (VITAMIN B12) 1000 MCG tablet Take 1,000 mcg by mouth daily.     linaclotide  (LINZESS ) 72 MCG capsule Take 1 capsule (72 mcg total) by mouth daily. 30 capsule 3   omeprazole  (PRILOSEC) 20 MG capsule Take 1 capsule (20 mg total) by mouth daily. 30 capsule 3   ondansetron  (ZOFRAN ) 4 MG tablet Take 1 tablet (4 mg total) by mouth every 8 (eight) hours as needed for nausea or vomiting. 20 tablet 0   polyethylene glycol-electrolytes (NULYTELY) 420 g solution Prepare kit as directed and take by mouth as one dose. 4000 mL 0   Semaglutide , 1 MG/DOSE, 4 MG/3ML SOPN Inject 1 mg as directed once a week. 9 mL 1   No facility-administered medications prior to visit.     Per HPI unless specifically indicated in ROS section below Review of Systems  Constitutional:  Negative for chills, fatigue and fever.  HENT:  Negative for congestion and ear pain.   Eyes:  Negative for pain and redness.  Respiratory:  Negative for cough and shortness of breath.   Cardiovascular:  Negative for chest pain, palpitations and leg swelling.  Gastrointestinal:  Negative for abdominal pain, blood in stool, constipation, diarrhea, nausea and vomiting.  Genitourinary:  Negative for dysuria and vaginal bleeding.  Musculoskeletal:  Negative for back pain  and myalgias.  Skin:  Negative for rash.  Neurological:  Negative for dizziness, syncope, light-headedness and headaches.  Psychiatric/Behavioral:  Negative for dysphoric mood. The patient is not nervous/anxious.    Objective:  BP 132/72 (BP Location: Left Arm, Patient Position: Sitting, Cuff Size: Large)   Pulse 86   Temp 98.5 F (36.9 C) (Temporal)   Ht 5' 1 (1.549 m)   Wt 171 lb (77.6 kg)   SpO2 96%   BMI 32.31 kg/m   Wt Readings from Last 3 Encounters:  02/13/23 171 lb (77.6 kg)  01/16/23 172 lb (78 kg)  12/19/22 174 lb 2 oz (79 kg)      Physical Exam Constitutional:       General: She is not in acute distress.    Appearance: Normal appearance. She is well-developed. She is not ill-appearing or toxic-appearing.  HENT:     Head: Normocephalic.     Right Ear: Hearing, tympanic membrane, ear canal and external ear normal. Tympanic membrane is not erythematous, retracted or bulging.     Left Ear: Hearing, tympanic membrane, ear canal and external ear normal. Tympanic membrane is not erythematous, retracted or bulging.     Nose: No mucosal edema or rhinorrhea.     Right Sinus: No maxillary sinus tenderness or frontal sinus tenderness.     Left Sinus: No maxillary sinus tenderness or frontal sinus tenderness.     Mouth/Throat:     Pharynx: Uvula midline.  Eyes:     General: Lids are normal. Lids are everted, no foreign bodies appreciated.     Conjunctiva/sclera: Conjunctivae normal.     Pupils: Pupils are equal, round, and reactive to light.  Neck:     Thyroid : No thyroid  mass or thyromegaly.     Vascular: No carotid bruit.     Trachea: Trachea normal.  Cardiovascular:     Rate and Rhythm: Normal rate and regular rhythm.     Pulses: Normal pulses.     Heart sounds: Normal heart sounds, S1 normal and S2 normal. No murmur heard.    No friction rub. No gallop.  Pulmonary:     Effort: Pulmonary effort is normal. No tachypnea or respiratory distress.     Breath sounds: Normal breath sounds. No decreased breath sounds, wheezing, rhonchi or rales.  Abdominal:     General: Bowel sounds are normal.     Palpations: Abdomen is soft.     Tenderness: There is no abdominal tenderness.  Musculoskeletal:     Cervical back: Normal range of motion and neck supple.     Lumbar back: Tenderness present. No spasms or bony tenderness. Decreased range of motion. Negative right straight leg raise test and negative left straight leg raise test.  Skin:    General: Skin is warm and dry.     Findings: No rash.  Neurological:     Mental Status: She is alert.  Psychiatric:         Mood and Affect: Mood is not anxious or depressed.        Speech: Speech normal.        Behavior: Behavior normal. Behavior is cooperative.        Thought Content: Thought content normal.        Judgment: Judgment normal.         Results for orders placed or performed in visit on 02/13/23  POCT glycosylated hemoglobin (Hb A1C)   Collection Time: 02/13/23 10:53 AM  Result Value Ref Range   Hemoglobin A1C  6.1 (A) 4.0 - 5.6 %   HbA1c POC (<> result, manual entry)     HbA1c, POC (prediabetic range)     HbA1c, POC (controlled diabetic range)     Assessment and Plan:  Type 2 diabetes mellitus with neurological complications (HCC) Assessment & Plan: Chronic,  well controlled diabetes, slight gradual weight loss Semaglutide  1 mg weekly  Orders: -     POCT glycosylated hemoglobin (Hb A1C)  Hypertension associated with diabetes (HCC) Assessment & Plan: Chronic,   Improved  control in office..  Weight loss and diet changes encouraged to help with  HTN control.   amlodipine /olmesartan  10/40 mg p.o. daily... minimal associated cough   Diabetic retinopathy associated with diabetes mellitus of other type, macular edema presence unspecified, unspecified laterality, unspecified retinopathy severity (HCC) Assessment & Plan:  Followed by opthamology

## 2023-02-15 ENCOUNTER — Ambulatory Visit: Payer: PPO | Admitting: Family Medicine

## 2023-02-18 NOTE — H&P (Signed)
Pre-Procedure H&P   Patient ID: Patricia Horne is a 73 y.o. female.  Gastroenterology Provider: Jaynie Collins, DO  Referring Provider: Fransico Setters, NP PCP: Excell Seltzer, MD  Date: 02/19/2023  HPI [name] is a 73 y.o. female who presents today for Colonoscopy for Personal history of colon polyps .  Last underwent colonoscopy in December 2019-cecal lipoma and sigmoid diverticulosis.  Colonoscopy in March 2016 with 3 adenomatous polyps.  Reports constipation without melena or hematochezia.  Status post hysterectomy.  On Ozempic which has been held for this procedure. Last dose over 1 week ago.  Hemoglobin 13.1 MCV 95 platelets 251,000 creatinine 1.25 B12 290  Paternal grandmother ulcerative colitis, paternal grandfather colon polyps   Past Medical History:  Diagnosis Date   Abdominal pain, left lower quadrant    Acute upper respiratory infections of unspecified site    Allergic genetic state    Arthritis    in arms   Diabetic retinopathy (HCC)    Glaucoma    Grade I diastolic dysfunction    Lipoma of unspecified site    Macular degeneration    Neuropathy    Obesity    Other and unspecified noninfectious gastroenteritis and colitis(558.9)    Other screening mammogram    Routine general medical examination at a health care facility    Screening for lipoid disorders    Special screening for osteoporosis    Tibialis tendinitis    Tubular adenoma of colon 03/31/2014   Type II or unspecified type diabetes mellitus without mention of complication, not stated as uncontrolled    Undiagnosed cardiac murmurs    Unspecified essential hypertension     Past Surgical History:  Procedure Laterality Date   CATARACT EXTRACTION W/PHACO Left 09/29/2019   Procedure: CATARACT EXTRACTION PHACO AND INTRAOCULAR LENS PLACEMENT (IOC) LEFT DIABETIC 3.42 00:32.9;  Surgeon: Nevada Crane, MD;  Location: [facility];  Service: Ophthalmology;  Laterality:  Left;   CATARACT EXTRACTION W/PHACO Right 07/03/2022   Procedure: CATARACT EXTRACTION PHACO AND INTRAOCULAR LENS PLACEMENT (IOC) RIGHT INTRAVITREAL KENALOG INJECTION DIABETIC  5.53  00:46.1;  Surgeon: Nevada Crane, MD;  Location: [facility];  Service: Ophthalmology;  Laterality: Right;  Diabetic   COLONOSCOPY  04/01/2004   COLONOSCOPY  03/31/2014   COLONOSCOPY WITH PROPOFOL N/A 01/01/2018   Procedure: COLONOSCOPY WITH PROPOFOL;  Surgeon: Christena Deem, MD;  Location: [facility];  Service: Endoscopy;  Laterality: N/A;   EYE SURGERY     LIPOMA EXCISION Left 2010   arm   TOE SURGERY  1990's   bunion, hammer toe repair   TOTAL ABDOMINAL HYSTERECTOMY  1983   one ovary removed, later other ovary removed    Family History Paternal grandmother ulcerative colitis, paternal grandfather colon polyps No other h/o GI disease or malignancy  Review of Systems  Constitutional:  Negative for activity change, appetite change, chills, diaphoresis, fatigue, fever and unexpected weight change.  HENT:  Negative for trouble swallowing and voice change.   Respiratory:  Negative for shortness of breath and wheezing.   Cardiovascular:  Negative for chest pain, palpitations and leg swelling.  Gastrointestinal:  Positive for constipation. Negative for abdominal distention, abdominal pain, anal bleeding, blood in stool, diarrhea, nausea, rectal pain and vomiting.  Musculoskeletal:  Negative for arthralgias and myalgias.  Skin:  Negative for color change and pallor.  Neurological:  Negative for dizziness, syncope and weakness.  Psychiatric/Behavioral:  Negative for confusion.   All other systems reviewed  and are negative.    Medications No current facility-administered medications on file prior to encounter.   Current Outpatient Medications on File Prior to Encounter  Medication Sig Dispense Refill   amLODipine-olmesartan (AZOR) 10-40 MG tablet Take 1 tablet by mouth daily. 30 tablet  11   linaclotide (LINZESS) 72 MCG capsule Take 1 capsule (72 mcg total) by mouth daily. 30 capsule 3   acetaminophen (TYLENOL) 500 MG tablet Take 500 mg by mouth every 6 (six) hours as needed.     Cholecalciferol (VITAMIN D) 50 MCG (2000 UT) CAPS Take by mouth.     Continuous Glucose Sensor (FREESTYLE LIBRE 3 SENSOR) MISC Place 1 sensor on the skin every 14 days. Use to check glucose continuously 2 each 11   omeprazole (PRILOSEC) 20 MG capsule Take 1 capsule (20 mg total) by mouth daily. 30 capsule 3   ondansetron (ZOFRAN) 4 MG tablet Take 1 tablet (4 mg total) by mouth every 8 (eight) hours as needed for nausea or vomiting. 20 tablet 0   polyethylene glycol-electrolytes (NULYTELY) 420 g solution Prepare kit as directed and take by mouth as one dose. 4000 mL 0   Semaglutide, 1 MG/DOSE, 4 MG/3ML SOPN Inject 1 mg as directed once a week. 9 mL 1    Pertinent medications related to GI and procedure were reviewed by me with the patient prior to the procedure   Current Facility-Administered Medications:    0.9 %  sodium chloride infusion, , Intravenous, Continuous, Jaynie Collins, DO  sodium chloride         Allergies  Allergen Reactions   Amoxicillin Diarrhea   Allergies were reviewed by me prior to the procedure  Objective   Body mass index is 32.12 kg/m. Vitals:   02/19/23 0745 02/19/23 0804  BP:  (!) 157/84  Pulse:  78  Resp:  18  Temp:  (!) 96.8 F (36 C)  TempSrc:  Temporal  SpO2:  100%  Weight: 77.1 kg   Height: 5\' 1"  (1.549 m)      Physical Exam Vitals and nursing note reviewed.  Constitutional:      General: She is not in acute distress.    Appearance: Normal appearance. She is obese. She is not ill-appearing, toxic-appearing or diaphoretic.  HENT:     Head: Normocephalic and atraumatic.     Nose: Nose normal.     Mouth/Throat:     Mouth: Mucous membranes are moist.     Pharynx: Oropharynx is clear.  Eyes:     General: No scleral icterus.     Extraocular Movements: Extraocular movements intact.  Cardiovascular:     Rate and Rhythm: Normal rate and regular rhythm.     Heart sounds: Normal heart sounds. No murmur heard.    No friction rub. No gallop.  Pulmonary:     Effort: Pulmonary effort is normal. No respiratory distress.     Breath sounds: Normal breath sounds. No wheezing, rhonchi or rales.  Abdominal:     General: Bowel sounds are normal. There is no distension.     Palpations: Abdomen is soft.     Tenderness: There is no abdominal tenderness. There is no guarding or rebound.  Musculoskeletal:     Cervical back: Neck supple.     Right lower leg: No edema.     Left lower leg: No edema.  Skin:    General: Skin is warm and dry.     Coloration: Skin is not jaundiced or pale.  Neurological:  General: No focal deficit present.     Mental Status: She is alert and oriented to person, place, and time. Mental status is at baseline.  Psychiatric:        Mood and Affect: Mood normal.        Behavior: Behavior normal.        Thought Content: Thought content normal.        Judgment: Judgment normal.      Assessment:  [name] is a 73 y.o. female  who presents today for Colonoscopy for personal history of colon polyps.  Plan:  Colonoscopy with possible intervention today  Colonoscopy with possible biopsy, control of bleeding, polypectomy, and interventions as necessary has been discussed with the patient/patient representative. Informed consent was obtained from the patient/patient representative after explaining the indication, nature, and risks of the procedure including but not limited to death, bleeding, perforation, missed neoplasm/lesions, cardiorespiratory compromise, and reaction to medications. Opportunity for questions was given and appropriate answers were provided. Patient/patient representative has verbalized understanding is amenable to undergoing the procedure.   Jaynie Collins, DO   [facility]  Portions of the record may have been created with voice recognition software. Occasional wrong-word or 'sound-a-like' substitutions may have occurred due to the inherent limitations of voice recognition software.  Read the chart carefully and recognize, using context, where substitutions may have occurred.

## 2023-02-19 ENCOUNTER — Encounter: Payer: Self-pay | Admitting: Gastroenterology

## 2023-02-19 ENCOUNTER — Ambulatory Visit
Admission: RE | Admit: 2023-02-19 | Discharge: 2023-02-19 | Disposition: A | Discharge status: Home / Self Care | Payer: PPO | Attending: Gastroenterology | Admitting: Gastroenterology

## 2023-02-19 ENCOUNTER — Encounter
Admission: RE | Disposition: A | Discharge status: Home / Self Care | Payer: Self-pay | Source: Home / Self Care | Attending: Gastroenterology

## 2023-02-19 ENCOUNTER — Ambulatory Visit: Payer: PPO | Admitting: Anesthesiology

## 2023-02-19 DIAGNOSIS — K621 Rectal polyp: Secondary | ICD-10-CM | POA: Insufficient documentation

## 2023-02-19 DIAGNOSIS — Z6832 Body mass index (BMI) 32.0-32.9, adult: Secondary | ICD-10-CM | POA: Diagnosis not present

## 2023-02-19 DIAGNOSIS — Z8379 Family history of other diseases of the digestive system: Secondary | ICD-10-CM | POA: Diagnosis not present

## 2023-02-19 DIAGNOSIS — I10 Essential (primary) hypertension: Secondary | ICD-10-CM | POA: Insufficient documentation

## 2023-02-19 DIAGNOSIS — K589 Irritable bowel syndrome without diarrhea: Secondary | ICD-10-CM | POA: Diagnosis not present

## 2023-02-19 DIAGNOSIS — Z83719 Family history of colon polyps, unspecified: Secondary | ICD-10-CM | POA: Insufficient documentation

## 2023-02-19 DIAGNOSIS — K635 Polyp of colon: Secondary | ICD-10-CM | POA: Diagnosis not present

## 2023-02-19 DIAGNOSIS — K219 Gastro-esophageal reflux disease without esophagitis: Secondary | ICD-10-CM | POA: Diagnosis not present

## 2023-02-19 DIAGNOSIS — D123 Benign neoplasm of transverse colon: Secondary | ICD-10-CM | POA: Diagnosis not present

## 2023-02-19 DIAGNOSIS — K5730 Diverticulosis of large intestine without perforation or abscess without bleeding: Secondary | ICD-10-CM | POA: Diagnosis not present

## 2023-02-19 DIAGNOSIS — E1140 Type 2 diabetes mellitus with diabetic neuropathy, unspecified: Secondary | ICD-10-CM | POA: Insufficient documentation

## 2023-02-19 DIAGNOSIS — Z7985 Long-term (current) use of injectable non-insulin antidiabetic drugs: Secondary | ICD-10-CM | POA: Diagnosis not present

## 2023-02-19 DIAGNOSIS — D125 Benign neoplasm of sigmoid colon: Secondary | ICD-10-CM | POA: Insufficient documentation

## 2023-02-19 DIAGNOSIS — E113312 Type 2 diabetes mellitus with moderate nonproliferative diabetic retinopathy with macular edema, left eye: Secondary | ICD-10-CM | POA: Diagnosis not present

## 2023-02-19 DIAGNOSIS — H4311 Vitreous hemorrhage, right eye: Secondary | ICD-10-CM | POA: Diagnosis not present

## 2023-02-19 DIAGNOSIS — Z90710 Acquired absence of both cervix and uterus: Secondary | ICD-10-CM | POA: Insufficient documentation

## 2023-02-19 DIAGNOSIS — K5900 Constipation, unspecified: Secondary | ICD-10-CM | POA: Diagnosis not present

## 2023-02-19 DIAGNOSIS — E66813 Obesity, class 3: Secondary | ICD-10-CM | POA: Diagnosis not present

## 2023-02-19 DIAGNOSIS — Z1211 Encounter for screening for malignant neoplasm of colon: Secondary | ICD-10-CM | POA: Diagnosis not present

## 2023-02-19 DIAGNOSIS — Z860101 Personal history of adenomatous and serrated colon polyps: Secondary | ICD-10-CM | POA: Diagnosis not present

## 2023-02-19 DIAGNOSIS — E113511 Type 2 diabetes mellitus with proliferative diabetic retinopathy with macular edema, right eye: Secondary | ICD-10-CM | POA: Diagnosis not present

## 2023-02-19 HISTORY — DX: Type 2 diabetes mellitus with unspecified diabetic retinopathy without macular edema: E11.319

## 2023-02-19 HISTORY — PX: POLYPECTOMY: SHX5525

## 2023-02-19 HISTORY — DX: Unspecified macular degeneration: H35.30

## 2023-02-19 HISTORY — PX: COLONOSCOPY WITH PROPOFOL: SHX5780

## 2023-02-19 LAB — OPHTHALMOLOGY REPORT-SCANNED

## 2023-02-19 LAB — GLUCOSE, CAPILLARY: Glucose-Capillary: 94 mg/dL (ref 70–99)

## 2023-02-19 SURGERY — COLONOSCOPY WITH PROPOFOL
Anesthesia: General

## 2023-02-19 MED ORDER — PROPOFOL 10 MG/ML IV BOLUS
INTRAVENOUS | Status: DC | PRN
  Administered 2023-02-19: 100 mg via INTRAVENOUS

## 2023-02-19 MED ORDER — PROPOFOL 500 MG/50ML IV EMUL
INTRAVENOUS | Status: DC | PRN
  Administered 2023-02-19: 150 ug/kg/min via INTRAVENOUS

## 2023-02-19 MED ORDER — SODIUM CHLORIDE 0.9 % IV SOLN: INTRAVENOUS | Status: DC

## 2023-02-19 MED ORDER — EPHEDRINE SULFATE-NACL 50-0.9 MG/10ML-% IV SOSY
PREFILLED_SYRINGE | INTRAVENOUS | Status: DC | PRN
  Administered 2023-02-19: 10 mg via INTRAVENOUS

## 2023-02-19 MED ORDER — EPHEDRINE 5 MG/ML INJ
INTRAVENOUS | Status: AC
  Filled 2023-02-19: qty 5

## 2023-02-19 MED ORDER — PROPOFOL 1000 MG/100ML IV EMUL
INTRAVENOUS | Status: AC
  Filled 2023-02-19: qty 100

## 2023-02-19 NOTE — Interval H&P Note (Signed)
History and Physical Interval Note: Preprocedure H&P from 02/19/23  was reviewed and there was no interval change after seeing and examining the patient.  Written consent was obtained from the patient after discussion of risks, benefits, and alternatives. Patient has consented to proceed with Colonoscopy with possible intervention   02/19/2023 8:15 AM  Patricia Horne  has presented today for surgery, with the diagnosis of Z86.0101 (ICD-10-CM) - Hx of adenomatous colonic polyps K59.00 (ICD-10-CM) - Constipation, unspecified constipation type.  The various methods of treatment have been discussed with the patient and family. After consideration of risks, benefits and other options for treatment, the patient has consented to  Procedure(s): COLONOSCOPY WITH PROPOFOL (N/A) as a surgical intervention.  The patient's history has been reviewed, patient examined, no change in status, stable for surgery.  I have reviewed the patient's chart and labs.  Questions were answered to the patient's satisfaction.     Patricia Horne

## 2023-02-19 NOTE — Anesthesia Preprocedure Evaluation (Addendum)
Anesthesia Evaluation  Patient identified by MRN, date of birth, ID band Patient awake    Reviewed: Allergy & Precautions, NPO status , Patient's Chart, lab work & pertinent test results  Airway Mallampati: III  TM Distance: >3 FB Neck ROM: full    Dental  (+) Teeth Intact, Caps, Dental Advisory Given   Pulmonary neg pulmonary ROS   Pulmonary exam normal breath sounds clear to auscultation       Cardiovascular Exercise Tolerance: Good hypertension, Pt. on medications negative cardio ROS Normal cardiovascular exam Rhythm:Regular Rate:Normal     Neuro/Psych   Anxiety     negative neurological ROS  negative psych ROS   GI/Hepatic negative GI ROS, Neg liver ROS,GERD  Medicated,,  Endo/Other  negative endocrine ROSdiabetes, Well Controlled, Type 2, Oral Hypoglycemic Agents  Class 3 obesity  Renal/GU negative Renal ROS  negative genitourinary   Musculoskeletal   Abdominal  (+) + obese  Peds negative pediatric ROS (+)  Hematology negative hematology ROS (+)   Anesthesia Other Findings Past Medical History: No date: Abdominal pain, left lower quadrant No date: Acute upper respiratory infections of unspecified site No date: Allergic genetic state No date: Arthritis     Comment:  in arms No date: Diabetic retinopathy (HCC) No date: Glaucoma No date: Grade I diastolic dysfunction No date: Lipoma of unspecified site No date: Macular degeneration No date: Neuropathy No date: Obesity No date: Other and unspecified noninfectious gastroenteritis and  colitis(558.9) No date: Other screening mammogram No date: Routine general medical examination at a health care facility No date: Screening for lipoid disorders No date: Special screening for osteoporosis No date: Tibialis tendinitis 03/31/2014: Tubular adenoma of colon No date: Type II or unspecified type diabetes mellitus without  mention of complication, not stated as  uncontrolled No date: Undiagnosed cardiac murmurs No date: Unspecified essential hypertension  Past Surgical History: 09/29/2019: CATARACT EXTRACTION W/PHACO; Left     Comment:  Procedure: CATARACT EXTRACTION PHACO AND INTRAOCULAR               LENS PLACEMENT (IOC) LEFT DIABETIC 3.42 00:32.9;                Surgeon: Nevada Crane, MD;  Location: [facility];  Service: Ophthalmology;  Laterality: Left; 07/03/2022: CATARACT EXTRACTION W/PHACO; Right     Comment:  Procedure: CATARACT EXTRACTION PHACO AND INTRAOCULAR               LENS PLACEMENT (IOC) RIGHT INTRAVITREAL KENALOG INJECTION              DIABETIC  5.53  00:46.1;  Surgeon: Nevada Crane,               MD;  Location: [facility];  Service:               Ophthalmology;  Laterality: Right;  Diabetic 04/01/2004: COLONOSCOPY 03/31/2014: COLONOSCOPY 01/01/2018: COLONOSCOPY WITH PROPOFOL; N/A     Comment:  Procedure: COLONOSCOPY WITH PROPOFOL;  Surgeon:               Christena Deem, MD;  Location: [facility] END[facility]COPY;                Service: Endoscopy;  Laterality: N/A; No date: EYE SURGERY 2010: LIPOMA EXCISION; Left     Comment:  arm 1990's: TOE SURGERY     Comment:  bunion, hammer toe repair 1983: TOTAL  ABDOMINAL HYSTERECTOMY     Comment:  one ovary removed, later other ovary removed  BMI    Body Mass Index: 32.12 kg/m      Reproductive/Obstetrics negative OB ROS                             Anesthesia Physical Anesthesia Plan  ASA: 3  Anesthesia Plan: General   Post-op Pain Management:    Induction: Intravenous  PONV Risk Score and Plan: Propofol infusion and TIVA  Airway Management Planned: Natural Airway and Nasal Cannula  Additional Equipment:   Intra-op Plan:   Post-operative Plan:   Informed Consent: I have reviewed the patients History and Physical, chart, labs and discussed the procedure including the risks, benefits and alternatives  for the proposed anesthesia with the patient or authorized representative who has indicated his/her understanding and acceptance.     Dental Advisory Given  Plan Discussed with: CRNA and Surgeon  Anesthesia Plan Comments:        Anesthesia Quick Evaluation

## 2023-02-19 NOTE — Transfer of Care (Signed)
Immediate Anesthesia Transfer of Care Note  Patient: Patricia Horne  Procedure(s) Performed: COLONOSCOPY WITH PROPOFOL POLYPECTOMY  Patient Location: PACU2  Anesthesia Type:General  Level of Consciousness: awake and sedated  Airway & Oxygen Therapy: Patient connected to face mask oxygen  Post-op Assessment: Report given to RN and Post -op Vital signs reviewed and stable  Post vital signs: Reviewed and stable  Last Vitals:  Vitals Value Taken Time  BP 109/57 02/19/23 0909  Temp    Pulse 81 02/19/23 0909  Resp 28 02/19/23 0909  SpO2 97 % 02/19/23 0909  Vitals shown include unfiled device data.  Last Pain:  Vitals:   02/19/23 0909  TempSrc:   PainSc: 0-No pain         Complications: There were no known notable events for this encounter.

## 2023-02-19 NOTE — Anesthesia Postprocedure Evaluation (Signed)
Anesthesia Post Note  Patient: Patricia Horne  Procedure(s) Performed: COLONOSCOPY WITH PROPOFOL POLYPECTOMY  Patient location during evaluation: PACU Anesthesia Type: General Level of consciousness: awake Pain management: pain level controlled Vital Signs Assessment: post-procedure vital signs reviewed and stable Cardiovascular status: stable Anesthetic complications: no   There were no known notable events for this encounter.   Last Vitals:  Vitals:   02/19/23 0909 02/19/23 0921  BP: (!) 109/57 132/67  Pulse: 80 78  Resp: 20 20  Temp:    SpO2: 96% 99%    Last Pain:  Vitals:   02/19/23 0921  TempSrc:   PainSc: 0-No pain                 VAN STAVEREN,[name]

## 2023-02-19 NOTE — Op Note (Addendum)
[facility] Gastroenterology Patient Name: Patricia Horne Procedure Date: 02/19/2023 8:08 AM MRN: [id number] Account #: [id number] Date of Birth: 01-07-1951 Admit Type: Outpatient Age: 73 Room: [facility] ENDO ROOM 2 Gender: Female Note Status: Finalized Instrument Name: Colonoscope [id number] Procedure:             Colonoscopy Indications:           High risk colon cancer surveillance: Personal history                         of colonic polyps Providers:             Jaynie Collins DO, DO Referring MD:          Excell Seltzer MD, MD (Referring MD) Medicines:             Monitored Anesthesia Care Complications:         No immediate complications. Estimated blood loss:                         Minimal. Procedure:             Pre-Anesthesia Assessment:                        - Prior to the procedure, a History and Physical was                         performed, and patient medications and allergies were                         reviewed. The patient is competent. The risks and                         benefits of the procedure and the sedation options and                         risks were discussed with the patient. All questions                         were answered and informed consent was obtained.                         Patient identification and proposed procedure were                         verified by the physician, the nurse, the anesthetist                         and the technician in the endoscopy suite. Mental                         Status Examination: alert and oriented. Airway                         Examination: normal oropharyngeal airway and neck                         mobility. Respiratory Examination: clear to  auscultation. CV Examination: RRR, no murmurs, no S3                         or S4. Prophylactic Antibiotics: The patient does not                         require prophylactic antibiotics. Prior                          Anticoagulants: The patient has taken no anticoagulant                         or antiplatelet agents. ASA Grade Assessment: III - A                         patient with severe systemic disease. After reviewing                         the risks and benefits, the patient was deemed in                         satisfactory condition to undergo the procedure. The                         anesthesia plan was to use monitored anesthesia care                         (MAC). Immediately prior to administration of                         medications, the patient was re-assessed for adequacy                         to receive sedatives. The heart rate, respiratory                         rate, oxygen saturations, blood pressure, adequacy of                         pulmonary ventilation, and response to care were                         monitored throughout the procedure. The physical                         status of the patient was re-assessed after the                         procedure.                        After obtaining informed consent, the colonoscope was                         passed under direct vision. Throughout the procedure,                         the patient's blood pressure, pulse, and oxygen  saturations were monitored continuously. The                         Colonoscope was introduced through the anus and                         advanced to the the terminal ileum, with                         identification of the appendiceal orifice and IC                         valve. The colonoscopy was performed without                         difficulty. The patient tolerated the procedure well.                         The quality of the bowel preparation was evaluated                         using the BBPS [facility] Bowel Preparation Scale) with                         scores of: Right Colon = 2 (minor amount of residual                         staining, small fragments  of stool and/or opaque                         liquid, but mucosa seen well), Transverse Colon = 3                         (entire mucosa seen well with no residual staining,                         small fragments of stool or opaque liquid) and Left                         Colon = 2 (minor amount of residual staining, small                         fragments of stool and/or opaque liquid, but mucosa                         seen well). The total BBPS score equals 7. The quality                         of the bowel preparation was good. The terminal ileum,                         ileocecal valve, appendiceal orifice, and rectum were                         photographed. Findings:      The perianal and digital rectal examinations were normal. Pertinent       negatives include normal sphincter  tone.      The terminal ileum appeared normal. Estimated blood loss: none.      Retroflexion in the right colon was performed.      Six sessile polyps were found in the rectum (2), sigmoid colon (2),       transverse colon (1) and hepatic flexure (1). The polyps were 1 to 2 mm       in size. These polyps were removed with a jumbo cold forceps. Resection       and retrieval were complete. Estimated blood loss was minimal.      Multiple small-mouthed diverticula were found in the sigmoid colon.       Estimated blood loss: none.      There was moderate spasm in the recto-sigmoid colon, in the sigmoid       colon and in the descending colon. Estimated blood loss: none.      The exam was otherwise without abnormality on direct and retroflexion       views. Impression:            - The examined portion of the ileum was normal.                        - Six 1 to 2 mm polyps in the rectum, in the sigmoid                         colon, in the transverse colon and at the hepatic                         flexure, removed with a jumbo cold forceps. Resected                         and retrieved.                         - Diverticulosis in the sigmoid colon.                        - Moderate colonic spasm.                        - The examination was otherwise normal on direct and                         retroflexion views. Recommendation:        - Patient has a contact number available for                         emergencies. The signs and symptoms of potential                         delayed complications were discussed with the patient.                         Return to normal activities tomorrow. Written                         discharge instructions were provided to the patient.                        -  Discharge patient to home.                        - Resume previous diet.                        - Continue present medications.                        - No ibuprofen, naproxen, or other non-steroidal                         anti-inflammatory drugs for 5 days after polyp removal.                        - Await pathology results.                        - Repeat colonoscopy for surveillance based on                         pathology results.                        - Return to referring physician as previously                         scheduled.                        - Continue with miralax daily to improve bowel                         movement frequency.                        - The findings and recommendations were discussed with                         the patient. Procedure Code(s):     --- Professional ---                        [phone], Colonoscopy, flexible; with biopsy, single or                         multiple Diagnosis Code(s):     --- Professional ---                        Z86.010, Personal history of colonic polyps                        K58.9, Irritable bowel syndrome without diarrhea                        D12.8, Benign neoplasm of rectum                        D12.5, Benign neoplasm of sigmoid colon                        D12.3, Benign neoplasm of transverse colon (hepatic  flexure or splenic flexure)                        K57.30, Diverticulosis of large intestine without                         perforation or abscess without bleeding CPT copyright 2022 American Medical Association. All rights reserved. The codes documented in this report are preliminary and upon coder review may  be revised to meet current compliance requirements. Attending Participation:      I personally performed the entire procedure. Elfredia Nevins, DO Jaynie Collins DO, DO 02/19/2023 9:03:11 AM This report has been signed electronically. Number of Addenda: 0 Note Initiated On: 02/19/2023 8:08 AM Scope Withdrawal Time: 0 hours 14 minutes 22 seconds  Total Procedure Duration: 0 hours 18 minutes 44 seconds  Estimated Blood Loss:  Estimated blood loss was minimal.      [facility]

## 2023-02-20 ENCOUNTER — Encounter: Payer: Self-pay | Admitting: Gastroenterology

## 2023-02-20 LAB — SURGICAL PATHOLOGY

## 2023-02-21 DIAGNOSIS — E113511 Type 2 diabetes mellitus with proliferative diabetic retinopathy with macular edema, right eye: Secondary | ICD-10-CM | POA: Diagnosis not present

## 2023-02-26 ENCOUNTER — Other Ambulatory Visit: Payer: Self-pay

## 2023-02-26 ENCOUNTER — Ambulatory Visit
Admission: RE | Admit: 2023-02-26 | Discharge: 2023-02-26 | Disposition: A | Discharge status: Home / Self Care | Payer: PPO | Source: Ambulatory Visit | Attending: Emergency Medicine | Admitting: Emergency Medicine

## 2023-02-26 VITALS — BP 105/69 | HR 85 | Temp 100.1°F | Resp 20

## 2023-02-26 DIAGNOSIS — J101 Influenza due to other identified influenza virus with other respiratory manifestations: Secondary | ICD-10-CM | POA: Diagnosis not present

## 2023-02-26 LAB — POC COVID19/FLU A&B COMBO
Covid Antigen, POC: NEGATIVE
Influenza A Antigen, POC: POSITIVE — AB
Influenza B Antigen, POC: NEGATIVE

## 2023-02-26 MED ORDER — OSELTAMIVIR PHOSPHATE 75 MG PO CAPS
75.0000 mg | ORAL_CAPSULE | Freq: Two times a day (BID) | ORAL | 0 refills | Status: DC
  Filled 2023-02-26: qty 10, 5d supply, fill #0

## 2023-02-26 NOTE — ED Triage Notes (Signed)
Symptoms started Saturday.  Has a runny nose, congestion, fever and cough, and right side of ribcage is sore.  States she is not coughing anything up.  Tylenol sinus severe has been tried, no medicine for symptoms today
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
## 2023-02-26 NOTE — Discharge Instructions (Addendum)
You are have influenza A which is  a virus and should steadily improve in time it can take up to 7 to 10 days before you truly start to see a turnaround however things will get better  Need to quarantine until you are without a fever for 24 hours.  Begin Tamiflu every morning and every evening to help minimize the amount of virus in your body which ideally will minimize your symptoms and time down your illness    You can take Tylenol and/or Ibuprofen as needed for fever reduction and pain relief.   For cough: honey 1/2 to 1 teaspoon (you can dilute the honey in water or another fluid).  You can also use guaifenesin and dextromethorphan for cough. You can use a humidifier for chest congestion and cough.  If you don't have a humidifier, you can sit in the bathroom with the hot shower running.      For sore throat: try warm salt water gargles, cepacol lozenges, throat spray, warm tea or water with lemon/honey, popsicles or ice, or OTC cold relief medicine for throat discomfort.   For congestion: take a daily anti-histamine like Zyrtec, Claritin, and a oral decongestant, such as pseudoephedrine.  You can also use Flonase 1-2 sprays in each nostril daily.   It is important to stay hydrated: drink plenty of fluids (water, gatorade/powerade/pedialyte, juices, or teas) to keep your throat moisturized and help further relieve irritation/discomfort.

## 2023-03-06 DIAGNOSIS — H4311 Vitreous hemorrhage, right eye: Secondary | ICD-10-CM | POA: Diagnosis not present

## 2023-03-06 DIAGNOSIS — E113511 Type 2 diabetes mellitus with proliferative diabetic retinopathy with macular edema, right eye: Secondary | ICD-10-CM | POA: Diagnosis not present

## 2023-03-06 DIAGNOSIS — Z961 Presence of intraocular lens: Secondary | ICD-10-CM | POA: Diagnosis not present

## 2023-03-06 DIAGNOSIS — E113312 Type 2 diabetes mellitus with moderate nonproliferative diabetic retinopathy with macular edema, left eye: Secondary | ICD-10-CM | POA: Diagnosis not present

## 2023-03-08 ENCOUNTER — Telehealth: Payer: Self-pay | Admitting: Family Medicine

## 2023-03-08 NOTE — Telephone Encounter (Signed)
 Ozemic was received in the mail on 03/08/2023 from [facility]. #4 boxes were received and placed in the fridge. Lot #: [id number] Exp: 09/29/2025 Pt is aware and will come by to pick the medication. Nothing further was needed.

## 2023-03-09 NOTE — Telephone Encounter (Signed)
Patient picked up meds.

## 2023-03-23 DIAGNOSIS — H4311 Vitreous hemorrhage, right eye: Secondary | ICD-10-CM | POA: Diagnosis not present

## 2023-03-23 DIAGNOSIS — E113511 Type 2 diabetes mellitus with proliferative diabetic retinopathy with macular edema, right eye: Secondary | ICD-10-CM | POA: Diagnosis not present

## 2023-03-23 DIAGNOSIS — E113552 Type 2 diabetes mellitus with stable proliferative diabetic retinopathy, left eye: Secondary | ICD-10-CM | POA: Diagnosis not present

## 2023-04-16 ENCOUNTER — Other Ambulatory Visit: Payer: Self-pay

## 2023-04-16 ENCOUNTER — Other Ambulatory Visit: Payer: Self-pay | Admitting: Family Medicine

## 2023-04-16 MED ORDER — OMEPRAZOLE 20 MG PO CPDR
20.0000 mg | DELAYED_RELEASE_CAPSULE | Freq: Every day | ORAL | 3 refills | Status: AC
  Filled 2023-04-16: qty 30, 30d supply, fill #0

## 2023-04-17 ENCOUNTER — Encounter: Payer: Self-pay | Admitting: Pharmacist

## 2023-04-17 NOTE — Progress Notes (Signed)
 Pharmacy Quality Measure Review  This patient is appearing on a report for being at risk of failing the adherence measure for hypertension (ACEi/ARB) medications this calendar year.   Medication: amlodipine/olmesartan Last fill date: 02/15/2023 for 30 day supply  Filling regularly at [facility]: Libre sensors Consider 90 ds to improve adherence/minimize   Attempted to call patient to discuss - Straight to voicemail.  Also Messaged patient via Mychart regarding refill.

## 2023-04-27 ENCOUNTER — Telehealth: Payer: Self-pay | Admitting: *Deleted

## 2023-04-27 DIAGNOSIS — E1149 Type 2 diabetes mellitus with other diabetic neurological complication: Secondary | ICD-10-CM

## 2023-04-27 NOTE — Telephone Encounter (Signed)
-----   Message from Vincenza Hews sent at 04/27/2023  1:58 PM EDT ----- Regarding: Lab Mon 05/14/23 Hello,   Patient has a lab appointment on Monday 05/14/23. Can we get lab orders please.   Thanks

## 2023-05-07 DIAGNOSIS — H4311 Vitreous hemorrhage, right eye: Secondary | ICD-10-CM | POA: Diagnosis not present

## 2023-05-07 DIAGNOSIS — E113511 Type 2 diabetes mellitus with proliferative diabetic retinopathy with macular edema, right eye: Secondary | ICD-10-CM | POA: Diagnosis not present

## 2023-05-07 DIAGNOSIS — E113552 Type 2 diabetes mellitus with stable proliferative diabetic retinopathy, left eye: Secondary | ICD-10-CM | POA: Diagnosis not present

## 2023-05-09 DIAGNOSIS — Z6832 Body mass index (BMI) 32.0-32.9, adult: Secondary | ICD-10-CM | POA: Diagnosis not present

## 2023-05-09 DIAGNOSIS — E669 Obesity, unspecified: Secondary | ICD-10-CM | POA: Diagnosis not present

## 2023-05-09 DIAGNOSIS — Z880 Allergy status to penicillin: Secondary | ICD-10-CM | POA: Diagnosis not present

## 2023-05-09 DIAGNOSIS — Z79899 Other long term (current) drug therapy: Secondary | ICD-10-CM | POA: Diagnosis not present

## 2023-05-09 DIAGNOSIS — Z7985 Long-term (current) use of injectable non-insulin antidiabetic drugs: Secondary | ICD-10-CM | POA: Diagnosis not present

## 2023-05-09 DIAGNOSIS — H4311 Vitreous hemorrhage, right eye: Secondary | ICD-10-CM | POA: Diagnosis not present

## 2023-05-09 DIAGNOSIS — E113591 Type 2 diabetes mellitus with proliferative diabetic retinopathy without macular edema, right eye: Secondary | ICD-10-CM | POA: Diagnosis not present

## 2023-05-10 ENCOUNTER — Other Ambulatory Visit: Payer: Self-pay

## 2023-05-10 DIAGNOSIS — H4311 Vitreous hemorrhage, right eye: Secondary | ICD-10-CM | POA: Diagnosis not present

## 2023-05-14 ENCOUNTER — Other Ambulatory Visit (INDEPENDENT_AMBULATORY_CARE_PROVIDER_SITE_OTHER): Payer: PPO

## 2023-05-14 DIAGNOSIS — E1149 Type 2 diabetes mellitus with other diabetic neurological complication: Secondary | ICD-10-CM | POA: Diagnosis not present

## 2023-05-14 LAB — LIPID PANEL
Cholesterol: 165 mg/dL (ref 0–200)
HDL: 51 mg/dL (ref >39.00)
LDL Cholesterol: 103 mg/dL — ABNORMAL HIGH (ref 0–99)
NonHDL: 114.36
Total CHOL/HDL Ratio: 3
Triglycerides: 58 mg/dL (ref 0.0–149.0)
VLDL: 11.6 mg/dL (ref 0.0–40.0)

## 2023-05-14 LAB — COMPREHENSIVE METABOLIC PANEL WITH GFR
ALT: 12 U/L (ref 0–35)
AST: 13 U/L (ref 0–37)
Albumin: 3.8 g/dL (ref 3.5–5.2)
Alkaline Phosphatase: 87 U/L (ref 39–117)
BUN: 39 mg/dL — ABNORMAL HIGH (ref 6–23)
CO2: 29 meq/L (ref 19–32)
Calcium: 9.2 mg/dL (ref 8.4–10.5)
Chloride: 104 meq/L (ref 96–112)
Creatinine, Ser: 1.15 mg/dL (ref 0.40–1.20)
GFR: 47.53 mL/min — ABNORMAL LOW (ref >60.00)
Glucose, Bld: 149 mg/dL — ABNORMAL HIGH (ref 70–99)
Potassium: 4 meq/L (ref 3.5–5.1)
Sodium: 139 meq/L (ref 135–145)
Total Bilirubin: 0.6 mg/dL (ref 0.2–1.2)
Total Protein: 7.1 g/dL (ref 6.0–8.3)

## 2023-05-14 LAB — HEMOGLOBIN A1C: Hgb A1c MFr Bld: 6.4 % (ref 4.6–6.5)

## 2023-05-15 ENCOUNTER — Encounter: Payer: Self-pay | Admitting: Family Medicine

## 2023-05-15 NOTE — Progress Notes (Signed)
 No critical labs need to be addressed urgently. We will discuss labs in detail at upcoming office visit.

## 2023-05-23 ENCOUNTER — Other Ambulatory Visit: Payer: Self-pay

## 2023-05-23 ENCOUNTER — Ambulatory Visit (INDEPENDENT_AMBULATORY_CARE_PROVIDER_SITE_OTHER): Payer: PPO | Admitting: Family Medicine

## 2023-05-23 ENCOUNTER — Encounter: Payer: Self-pay | Admitting: Family Medicine

## 2023-05-23 VITALS — BP 128/74 | HR 71 | Temp 98.4°F | Ht 60.43 in | Wt 167.0 lb

## 2023-05-23 DIAGNOSIS — E1169 Type 2 diabetes mellitus with other specified complication: Secondary | ICD-10-CM | POA: Diagnosis not present

## 2023-05-23 DIAGNOSIS — E1149 Type 2 diabetes mellitus with other diabetic neurological complication: Secondary | ICD-10-CM | POA: Diagnosis not present

## 2023-05-23 DIAGNOSIS — I152 Hypertension secondary to endocrine disorders: Secondary | ICD-10-CM

## 2023-05-23 DIAGNOSIS — M546 Pain in thoracic spine: Secondary | ICD-10-CM

## 2023-05-23 DIAGNOSIS — R0683 Snoring: Secondary | ICD-10-CM | POA: Diagnosis not present

## 2023-05-23 DIAGNOSIS — E785 Hyperlipidemia, unspecified: Secondary | ICD-10-CM | POA: Insufficient documentation

## 2023-05-23 DIAGNOSIS — Z789 Other specified health status: Secondary | ICD-10-CM

## 2023-05-23 DIAGNOSIS — Z0000 Encounter for general adult medical examination without abnormal findings: Secondary | ICD-10-CM

## 2023-05-23 DIAGNOSIS — E1159 Type 2 diabetes mellitus with other circulatory complications: Secondary | ICD-10-CM | POA: Diagnosis not present

## 2023-05-23 DIAGNOSIS — E1140 Type 2 diabetes mellitus with diabetic neuropathy, unspecified: Secondary | ICD-10-CM

## 2023-05-23 DIAGNOSIS — E13319 Other specified diabetes mellitus with unspecified diabetic retinopathy without macular edema: Secondary | ICD-10-CM

## 2023-05-23 MED ORDER — ROSUVASTATIN CALCIUM 5 MG PO TABS
2.5000 mg | ORAL_TABLET | ORAL | 11 refills | Status: DC
  Filled 2023-05-23: qty 12, 56d supply, fill #0
  Filled 2023-06-28 – 2023-07-03 (×3): qty 12, 56d supply, fill #1

## 2023-05-23 NOTE — Assessment & Plan Note (Signed)
 She is willing to try statin.. will try low dose  three days a week for  CVD risk reduction.  Crestor  2.5 mg three days a week

## 2023-05-23 NOTE — Assessment & Plan Note (Signed)
 Chronic,  well controlled diabetes, slight gradual weight loss Semaglutide 1 mg weekly

## 2023-05-23 NOTE — Patient Instructions (Addendum)
 Continue omeprazole  20 mg daily for 2 more weeks., if upper back apain and reflux not better increase to 2 a daily.  Can use Pepcid AC before  bed.  Call if not improving.  Start low dose cholesterol med.

## 2023-05-23 NOTE — Assessment & Plan Note (Signed)
 Chronic,   Improved  control in office..  Weight loss and diet changes encouraged to help with  HTN control.   amlodipine/olmesartan 10/40 mg p.o. daily... minimal associated cough

## 2023-05-23 NOTE — Assessment & Plan Note (Signed)
 Consider sleep study.. pt refuses at this time.

## 2023-05-23 NOTE — Progress Notes (Signed)
 Patient ID: Patricia Horne, female    DOB: March 21, 1950, 73 y.o.   MRN: [id number]  This visit was conducted in person.  BP 128/74   Pulse 71   Temp 98.4 F (36.9 C) (Oral)   Ht 5' 0.43" (1.535 m)   Wt 167 lb (75.8 kg)   SpO2 97%   BMI 32.15 kg/m    CC:  Chief Complaint  Patient presents with   Annual Exam    Still having issues with her back    Subjective:   HPI: Patricia Horne is a 73 y.o. female presenting on 05/23/2023 for Annual Exam (Still having issues with her back)  The patient presents for annual medicare wellness, complete physical and review of chronic health problems. He/She also has the following acute concerns today:   She continued to be tired. Sometimes may snore.  Wake tired.  She does not want to do a sleep study as she would not use  a CPAP.   Feels that GERD causes upper back pain... has started prilosec 20 mg daily in last week.. may help.  I have personally reviewed the Medicare Annual Wellness questionnaire and have noted 1. The patient's medical and social history 2. Their use of alcohol, tobacco or illicit drugs 3. Their current medications and supplements 4. The patient's functional ability including ADL's, fall risks, home safety risks and hearing or visual             impairment. 5. Diet and physical activities 6. Evidence for depression or mood disorders 7.         Updated provider list Cognitive evaluation was performed and recorded on pt medicare questionnaire form. The patients weight, height, BMI and visual acuity have been recorded in the chart   I have made referrals, counseling and provided education to the patient based review of the above and I have provided the pt with a written personalized care plan for preventive services.   Documentation of this information was scanned into the electronic record under the media tab.   Advance directives and end of life planning reviewed in detail with patient and documented in EMR.  Patient given handout on advance care directives if needed. HCPOA and living will updated if needed.  Screening vision and hearing documented in chart.  Flowsheet Row Office Visit from 05/23/2023 in [facility]  PHQ-2 Total Score 1      No falls in last 12 months.  Diabetes: Chronic,  well controlled diabetes, slight gradual weight loss Semaglutide  1 mg weekly  Was off for 2 weeks after eye surgery. On pred eye drops Lab Results  Component Value Date   HGBA1C 6.4 05/14/2023  Using medications without difficulties: Hypoglycemic episodes: Hyperglycemic episodes: Feet problems:neuropathy Blood Sugars averaging: eye exam within last year: yes Wt Readings from Last 3 Encounters:  05/23/23 167 lb (75.8 kg)  02/19/23 170 lb (77.1 kg)  02/13/23 171 lb (77.6 kg)   HTN, Well controlled  BP Readings from Last 3 Encounters:  05/23/23 128/74  02/26/23 105/69  02/19/23 132/67    Elevated Cholesterol:  worsened control.Statin intolerant, not interested in alternate medication previously given fear of SE aunt had.  Has not tried in past. Lab Results  Component Value Date   CHOL 165 05/14/2023   HDL 51.00 05/14/2023   LDLCALC 103 (H) 05/14/2023   TRIG 58.0 05/14/2023   CHOLHDL 3 05/14/2023  Using medications without problems: Muscle aches:  Diet compliance: moderate  Exercise: none Other complaints:        Relevant past medical, surgical, family and social history reviewed and updated as indicated. Interim medical history since our last visit reviewed. Allergies and medications reviewed and updated. Outpatient Medications Prior to Visit  Medication Sig Dispense Refill   acetaminophen  (TYLENOL ) 500 MG tablet Take 500 mg by mouth every 6 (six) hours as needed.     amLODipine -olmesartan  (AZOR ) 10-40 MG tablet Take 1 tablet by mouth daily. 30 tablet 11   Cholecalciferol  (VITAMIN D ) 50 MCG (2000 UT) CAPS Take by mouth.     Continuous Glucose  Sensor (FREESTYLE LIBRE 3 SENSOR) MISC Place 1 sensor on the skin every 14 days. Use to check glucose continuously 2 each 11   cyanocobalamin  (VITAMIN B12) 1000 MCG tablet Take 1,000 mcg by mouth daily.     linaclotide  (LINZESS ) 72 MCG capsule Take 1 capsule (72 mcg total) by mouth daily. 30 capsule 3   omeprazole  (PRILOSEC) 20 MG capsule Take 1 capsule (20 mg total) by mouth daily. 30 capsule 3   ondansetron  (ZOFRAN ) 4 MG tablet Take 1 tablet (4 mg total) by mouth every 8 (eight) hours as needed for nausea or vomiting. 20 tablet 0   polyethylene glycol-electrolytes (NULYTELY) 420 g solution Prepare kit as directed and take by mouth as one dose. 4000 mL 0   Semaglutide , 1 MG/DOSE, 4 MG/3ML SOPN Inject 1 mg as directed once a week. 9 mL 1   oseltamivir  (TAMIFLU ) 75 MG capsule Take 1 capsule (75 mg total) by mouth every 12 (twelve) hours. 10 capsule 0   No facility-administered medications prior to visit.     Per HPI unless specifically indicated in ROS section below Review of Systems  Constitutional:  Negative for fatigue and fever.  HENT:  Negative for congestion.   Eyes:  Negative for pain.  Respiratory:  Negative for cough and shortness of breath.   Cardiovascular:  Negative for chest pain, palpitations and leg swelling.  Gastrointestinal:  Negative for abdominal pain.  Genitourinary:  Negative for dysuria and vaginal bleeding.  Musculoskeletal:  Positive for back pain.  Neurological:  Negative for syncope, light-headedness and headaches.  Psychiatric/Behavioral:  Negative for dysphoric mood.     Objective:  BP 128/74   Pulse 71   Temp 98.4 F (36.9 C) (Oral)   Ht 5' 0.43" (1.535 m)   Wt 167 lb (75.8 kg)   SpO2 97%   BMI 32.15 kg/m   Wt Readings from Last 3 Encounters:  05/23/23 167 lb (75.8 kg)  02/19/23 170 lb (77.1 kg)  02/13/23 171 lb (77.6 kg)      Physical Exam Constitutional:      General: She is not in acute distress.    Appearance: Normal appearance. She is  well-developed. She is not ill-appearing or toxic-appearing.  HENT:     Head: Normocephalic.     Right Ear: Hearing, tympanic membrane, ear canal and external ear normal. Tympanic membrane is not erythematous, retracted or bulging.     Left Ear: Hearing, tympanic membrane, ear canal and external ear normal. Tympanic membrane is not erythematous, retracted or bulging.     Nose: No mucosal edema or rhinorrhea.     Right Sinus: No maxillary sinus tenderness or frontal sinus tenderness.     Left Sinus: No maxillary sinus tenderness or frontal sinus tenderness.     Mouth/Throat:     Pharynx: Uvula midline.  Eyes:     General: Lids are normal. Lids are everted,  no foreign bodies appreciated.     Conjunctiva/sclera: Conjunctivae normal.     Pupils: Pupils are equal, round, and reactive to light.  Neck:     Thyroid : No thyroid  mass or thyromegaly.     Vascular: No carotid bruit.     Trachea: Trachea normal.  Cardiovascular:     Rate and Rhythm: Normal rate and regular rhythm.     Pulses: Normal pulses.     Heart sounds: Normal heart sounds, S1 normal and S2 normal. No murmur heard.    No friction rub. No gallop.  Pulmonary:     Effort: Pulmonary effort is normal. No tachypnea or respiratory distress.     Breath sounds: Normal breath sounds. No decreased breath sounds, wheezing, rhonchi or rales.  Abdominal:     General: Bowel sounds are normal.     Palpations: Abdomen is soft.     Tenderness: There is no abdominal tenderness.  Musculoskeletal:     Cervical back: Normal range of motion and neck supple.  Skin:    General: Skin is warm and dry.     Findings: No rash.  Neurological:     Mental Status: She is alert.  Psychiatric:        Mood and Affect: Mood is not anxious or depressed.        Speech: Speech normal.        Behavior: Behavior normal. Behavior is cooperative.        Thought Content: Thought content normal.        Judgment: Judgment normal.       Results for orders  placed or performed in visit on 05/14/23  Lipid panel   Collection Time: 05/14/23  9:04 AM  Result Value Ref Range   Cholesterol 165 0 - 200 mg/dL   Triglycerides 16.1 0.0 - 149.0 mg/dL   HDL 09.60 >45.40 mg/dL   VLDL 98.1 0.0 - 19.1 mg/dL   LDL Cholesterol 478 (H) 0 - 99 mg/dL   Total CHOL/HDL Ratio 3    NonHDL 114.36   Hemoglobin A1c   Collection Time: 05/14/23  9:04 AM  Result Value Ref Range   Hgb A1c MFr Bld 6.4 4.6 - 6.5 %  Comprehensive metabolic panel with GFR   Collection Time: 05/14/23  9:04 AM  Result Value Ref Range   Sodium 139 135 - 145 mEq/L   Potassium 4.0 3.5 - 5.1 mEq/L   Chloride 104 96 - 112 mEq/L   CO2 29 19 - 32 mEq/L   Glucose, Bld 149 (H) 70 - 99 mg/dL   BUN 39 (H) 6 - 23 mg/dL   Creatinine, Ser 2.95 0.40 - 1.20 mg/dL   Total Bilirubin 0.6 0.2 - 1.2 mg/dL   Alkaline Phosphatase 87 39 - 117 U/L   AST 13 0 - 37 U/L   ALT 12 0 - 35 U/L   Total Protein 7.1 6.0 - 8.3 g/dL   Albumin 3.8 3.5 - 5.2 g/dL   GFR 62.13 (L) >08.65 mL/min   Calcium  9.2 8.4 - 10.5 mg/dL    Assessment and Plan The patient's preventative maintenance and recommended screening tests for an annual wellness exam were reviewed in full today. Brought up to date unless services declined.  Counselled on the importance of diet, exercise, and its role in overall health and mortality. The patient's FH and SH was reviewed, including their home life, tobacco status, and drug and alcohol status.   Vaccines: consider tdap and shingrix. Pap/DVE:  not indicated due  to age Mammo: 07/27/2022. Repeat due in 07/2023 Bone Density: 06/2021 normal, repeat due in 5 years Colon: 02/19/2023, repeat in 3 years Smoking Status: nonsmoker ETOH/ drug use: none/none  Hep C: done      Medicare annual wellness visit, subsequent  Type 2 diabetes mellitus with neurological complications (HCC) Assessment & Plan: Chronic,  well controlled diabetes, slight gradual weight loss Semaglutide  1 mg  weekly   Neuropathy due to type 2 diabetes mellitus (HCC) Assessment & Plan: Chronic, stable control   Trial of ALA 600 mg daily   Hypertension associated with diabetes (HCC) Assessment & Plan: Chronic,   Improved  control in office..  Weight loss and diet changes encouraged to help with  HTN control.   amlodipine /olmesartan  10/40 mg p.o. daily... minimal associated cough   Diabetic retinopathy associated with diabetes mellitus of other type, macular edema presence unspecified, unspecified laterality, unspecified retinopathy severity (HCC) Assessment & Plan:  Followed by opthamology   Statin intolerance  Hyperlipidemia associated with type 2 diabetes mellitus (HCC) Assessment & Plan:  She is willing to try statin.. will try low dose  three days a week for  CVD risk reduction.  Crestor  2.5 mg three days a week   Snoring Assessment & Plan:  Consider sleep study.. pt refuses at this time.   Acute midline thoracic back pain  Other orders -     Rosuvastatin  Calcium ; Take 0.5 tablets (2.5 mg total) by mouth every Monday, Wednesday, and Friday.  Dispense: 12 tablet; Refill: 11     Return in about 3 months (around 08/22/2023) for diabetes follow up with fasting labs priro including lipids and A1C.   Herby Lolling, MD

## 2023-05-23 NOTE — Assessment & Plan Note (Deleted)
 She is willing to try statin.. will try low dose  three days a week for  CVD risk reduction.  Crestor  2.5 mg three days a week

## 2023-05-23 NOTE — Assessment & Plan Note (Signed)
Followed by opthamology.

## 2023-05-23 NOTE — Assessment & Plan Note (Signed)
Chronic, stable control   Trial of ALA 600 mg daily

## 2023-05-24 ENCOUNTER — Telehealth: Payer: Self-pay | Admitting: *Deleted

## 2023-05-24 NOTE — Telephone Encounter (Signed)
 Received from [facility]:  Ozempic  4 mg/3ml Lot [id number].  Exp: 10/29/2025 x 4 pens.  Left message for Mikinzie letting her know medication is here at the office for pick up.

## 2023-05-25 ENCOUNTER — Other Ambulatory Visit: Payer: Self-pay

## 2023-05-25 NOTE — Telephone Encounter (Signed)
 Patient came by and picked up medication.

## 2023-06-01 ENCOUNTER — Other Ambulatory Visit: Payer: Self-pay

## 2023-06-12 DIAGNOSIS — H4311 Vitreous hemorrhage, right eye: Secondary | ICD-10-CM | POA: Diagnosis not present

## 2023-06-13 ENCOUNTER — Other Ambulatory Visit: Payer: Self-pay | Admitting: Family Medicine

## 2023-06-13 DIAGNOSIS — Z1231 Encounter for screening mammogram for malignant neoplasm of breast: Secondary | ICD-10-CM

## 2023-06-28 ENCOUNTER — Other Ambulatory Visit: Payer: Self-pay

## 2023-06-28 ENCOUNTER — Other Ambulatory Visit: Payer: Self-pay | Admitting: Family Medicine

## 2023-06-28 DIAGNOSIS — E1149 Type 2 diabetes mellitus with other diabetic neurological complication: Secondary | ICD-10-CM

## 2023-06-28 MED ORDER — FREESTYLE LIBRE 3 SENSOR MISC
11 refills | Status: AC
  Filled 2023-06-28: qty 2, 28d supply, fill #0
  Filled 2023-08-17: qty 2, 28d supply, fill #1
  Filled 2023-10-04: qty 2, 28d supply, fill #2
  Filled 2023-11-13: qty 2, 28d supply, fill #3
  Filled 2023-11-30 (×3): qty 2, 28d supply, fill #4

## 2023-07-02 ENCOUNTER — Other Ambulatory Visit: Payer: Self-pay

## 2023-07-03 ENCOUNTER — Other Ambulatory Visit: Payer: Self-pay

## 2023-07-03 MED ORDER — ZOSTER VAC RECOMB ADJUVANTED 50 MCG/0.5ML IM SUSR
0.5000 mL | Freq: Once | INTRAMUSCULAR | 1 refills | Status: AC
  Filled 2023-07-03: qty 0.5, 1d supply, fill #0
  Filled 2023-10-10: qty 0.5, 1d supply, fill #1

## 2023-07-05 ENCOUNTER — Encounter: Payer: Self-pay | Admitting: Pharmacist

## 2023-07-05 NOTE — Progress Notes (Signed)
 Pharmacy Quality Measure Review  This patient is appearing on a report for being at risk of failing the adherence measure for hypertension (ACEi/ARB) medications this calendar year.   Medication: olmesartan /amlodipine  40-10 Last fill date: 05/25/23 for 30 day supply  Insurance report was not up to date. No action needed at this time.  Medication has been refilled as of 06/01/23 x30 day supply, 07/03/23 x30 day supply  6 refills remaining on current script.

## 2023-07-24 ENCOUNTER — Telehealth: Payer: Self-pay | Admitting: *Deleted

## 2023-07-24 DIAGNOSIS — E1149 Type 2 diabetes mellitus with other diabetic neurological complication: Secondary | ICD-10-CM

## 2023-07-24 NOTE — Telephone Encounter (Signed)
-----   Message from Rocky FORBES Chock sent at 07/24/2023  2:00 PM EDT ----- Regarding: Labs for Tuesday 7.15.25 Please put lab orders in future. Thank you, Rocky

## 2023-07-30 ENCOUNTER — Ambulatory Visit
Admission: RE | Admit: 2023-07-30 | Discharge: 2023-07-30 | Disposition: A | Discharge status: Home / Self Care | Source: Ambulatory Visit | Attending: Family Medicine | Admitting: Family Medicine

## 2023-07-30 DIAGNOSIS — Z1231 Encounter for screening mammogram for malignant neoplasm of breast: Secondary | ICD-10-CM | POA: Insufficient documentation

## 2023-08-01 ENCOUNTER — Ambulatory Visit: Payer: Self-pay | Admitting: Family Medicine

## 2023-08-10 ENCOUNTER — Other Ambulatory Visit

## 2023-08-13 ENCOUNTER — Other Ambulatory Visit (INDEPENDENT_AMBULATORY_CARE_PROVIDER_SITE_OTHER)

## 2023-08-13 DIAGNOSIS — E1149 Type 2 diabetes mellitus with other diabetic neurological complication: Secondary | ICD-10-CM | POA: Diagnosis not present

## 2023-08-13 LAB — LIPID PANEL
Cholesterol: 176 mg/dL (ref 0–200)
HDL: 46.1 mg/dL (ref >39.00)
LDL Cholesterol: 115 mg/dL — ABNORMAL HIGH (ref 0–99)
NonHDL: 130.07
Total CHOL/HDL Ratio: 4
Triglycerides: 76 mg/dL (ref 0.0–149.0)
VLDL: 15.2 mg/dL (ref 0.0–40.0)

## 2023-08-13 LAB — COMPREHENSIVE METABOLIC PANEL WITH GFR
ALT: 9 U/L (ref 0–35)
AST: 13 U/L (ref 0–37)
Albumin: 3.8 g/dL (ref 3.5–5.2)
Alkaline Phosphatase: 66 U/L (ref 39–117)
BUN: 24 mg/dL — ABNORMAL HIGH (ref 6–23)
CO2: 27 meq/L (ref 19–32)
Calcium: 8.8 mg/dL (ref 8.4–10.5)
Chloride: 105 meq/L (ref 96–112)
Creatinine, Ser: 1.09 mg/dL (ref 0.40–1.20)
GFR: 50.59 mL/min — ABNORMAL LOW (ref >60.00)
Glucose, Bld: 86 mg/dL (ref 70–99)
Potassium: 3.9 meq/L (ref 3.5–5.1)
Sodium: 140 meq/L (ref 135–145)
Total Bilirubin: 0.9 mg/dL (ref 0.2–1.2)
Total Protein: 6.5 g/dL (ref 6.0–8.3)

## 2023-08-13 LAB — HEMOGLOBIN A1C: Hgb A1c MFr Bld: 6 % (ref 4.6–6.5)

## 2023-08-14 ENCOUNTER — Ambulatory Visit: Payer: Self-pay | Admitting: Family Medicine

## 2023-08-14 ENCOUNTER — Other Ambulatory Visit

## 2023-08-14 NOTE — Progress Notes (Signed)
 No critical labs need to be addressed urgently. We will discuss labs in detail at upcoming office visit.

## 2023-08-17 ENCOUNTER — Ambulatory Visit: Admitting: Family Medicine

## 2023-08-17 ENCOUNTER — Other Ambulatory Visit: Payer: Self-pay

## 2023-08-17 ENCOUNTER — Ambulatory Visit: Payer: Self-pay | Admitting: Family Medicine

## 2023-08-17 ENCOUNTER — Encounter: Payer: Self-pay | Admitting: Family Medicine

## 2023-08-17 ENCOUNTER — Ambulatory Visit
Admission: RE | Admit: 2023-08-17 | Discharge: 2023-08-17 | Disposition: A | Discharge status: Home / Self Care | Source: Ambulatory Visit | Attending: Family Medicine | Admitting: Family Medicine

## 2023-08-17 VITALS — BP 140/78 | HR 88 | Temp 98.2°F | Ht 60.43 in | Wt 162.1 lb

## 2023-08-17 DIAGNOSIS — E1140 Type 2 diabetes mellitus with diabetic neuropathy, unspecified: Secondary | ICD-10-CM | POA: Diagnosis not present

## 2023-08-17 DIAGNOSIS — M545 Low back pain, unspecified: Secondary | ICD-10-CM

## 2023-08-17 DIAGNOSIS — F5104 Psychophysiologic insomnia: Secondary | ICD-10-CM | POA: Insufficient documentation

## 2023-08-17 DIAGNOSIS — E785 Hyperlipidemia, unspecified: Secondary | ICD-10-CM | POA: Diagnosis not present

## 2023-08-17 DIAGNOSIS — I152 Hypertension secondary to endocrine disorders: Secondary | ICD-10-CM

## 2023-08-17 DIAGNOSIS — E1169 Type 2 diabetes mellitus with other specified complication: Secondary | ICD-10-CM | POA: Diagnosis not present

## 2023-08-17 DIAGNOSIS — E66811 Obesity, class 1: Secondary | ICD-10-CM

## 2023-08-17 DIAGNOSIS — E6609 Other obesity due to excess calories: Secondary | ICD-10-CM | POA: Diagnosis not present

## 2023-08-17 DIAGNOSIS — E1149 Type 2 diabetes mellitus with other diabetic neurological complication: Secondary | ICD-10-CM

## 2023-08-17 DIAGNOSIS — Z6831 Body mass index (BMI) 31.0-31.9, adult: Secondary | ICD-10-CM

## 2023-08-17 DIAGNOSIS — M48061 Spinal stenosis, lumbar region without neurogenic claudication: Secondary | ICD-10-CM | POA: Diagnosis not present

## 2023-08-17 DIAGNOSIS — E1159 Type 2 diabetes mellitus with other circulatory complications: Secondary | ICD-10-CM | POA: Diagnosis not present

## 2023-08-17 DIAGNOSIS — G8929 Other chronic pain: Secondary | ICD-10-CM | POA: Diagnosis not present

## 2023-08-17 DIAGNOSIS — M47816 Spondylosis without myelopathy or radiculopathy, lumbar region: Secondary | ICD-10-CM | POA: Diagnosis not present

## 2023-08-17 DIAGNOSIS — E13319 Other specified diabetes mellitus with unspecified diabetic retinopathy without macular edema: Secondary | ICD-10-CM

## 2023-08-17 LAB — MICROALBUMIN / CREATININE URINE RATIO
Creatinine,U: 225.6 mg/dL
Microalb Creat Ratio: 23.9 mg/g (ref 0.0–30.0)
Microalb, Ur: 5.4 mg/dL — ABNORMAL HIGH (ref 0.0–1.9)

## 2023-08-17 MED ORDER — TRAZODONE HCL 50 MG PO TABS
25.0000 mg | ORAL_TABLET | Freq: Every evening | ORAL | 3 refills | Status: AC | PRN
  Filled 2023-08-17: qty 30, 30d supply, fill #0

## 2023-08-17 MED ORDER — ROSUVASTATIN CALCIUM 5 MG PO TABS
5.0000 mg | ORAL_TABLET | ORAL | 11 refills | Status: AC
  Filled 2023-08-17 – 2023-10-04 (×2): qty 30, 70d supply, fill #0

## 2023-08-17 MED ORDER — MELOXICAM 15 MG PO TABS
15.0000 mg | ORAL_TABLET | Freq: Every day | ORAL | 0 refills | Status: DC
  Filled 2023-08-17: qty 30, 30d supply, fill #0

## 2023-08-17 NOTE — Assessment & Plan Note (Signed)
 Chronic, will try a trial of trazodone at night. Healthy sleep hygiene reviewed.

## 2023-08-17 NOTE — Assessment & Plan Note (Addendum)
 Chronic,  borderline  control in office..  Weight loss and diet changes encouraged to help with  HTN control.   amlodipine /olmesartan  10/40 mg p.o. daily... minimal associated cough

## 2023-08-17 NOTE — Assessment & Plan Note (Signed)
 Chronic, statin indicated.  She is no longer taking Crestor  given dosing misunderstanding.   Will restart at crestor  5 mg daily.

## 2023-08-17 NOTE — Assessment & Plan Note (Addendum)
 Chronic, she is continuing to lose weight and BMI is now 31 Encouraged exercise, weight loss, healthy eating habits.  On GLP1 for DM.

## 2023-08-17 NOTE — Assessment & Plan Note (Signed)
 Chronic,  well controlled diabetes, slight gradual weight loss Semaglutide 1 mg weekly

## 2023-08-17 NOTE — Progress Notes (Signed)
 Patient ID: Patricia Horne, female    DOB: 10-Apr-1950, 73 y.o.   MRN: [id number]  This visit was conducted in person.  BP (!) 140/78   Pulse 88   Temp 98.2 F (36.8 C) (Temporal)   Ht 5' 0.43 (1.535 m)   Wt 162 lb 2 oz (73.5 kg)   SpO2 96%   BMI 31.21 kg/m    CC:  Chief Complaint  Patient presents with   Diabetes    Subjective:   HPI: Patricia Horne is a 73 y.o. female presenting on 08/17/2023 for Diabetes   Hypertension:   well controlled previously,  has not yet taken  amlodipine /olmesartan   10/40 mg daily BP Readings from Last 3 Encounters:  08/17/23 (!) 140/78  05/23/23 128/74  02/26/23 105/69  Using medication without problems or lightheadedness:  none Chest pain with exertion:none Edema: stable Short of breath:none Average home BPs:  not checking.  Diabetes:  well controlled on  semaglutide  1 mg weekly. Lab Results  Component Value Date   HGBA1C 6.0 08/13/2023  Using medications without difficulties: Hypoglycemic episodes:  no recent lows Hyperglycemic episodes:  yes Feet problems: no ulcer Blood Sugars averaging:     not checking or wearing CGM lately Semaglutide  1 mg  weekly  Wt Readings from Last 3 Encounters:  08/17/23 162 lb 2 oz (73.5 kg)  05/23/23 167 lb (75.8 kg)  02/19/23 170 lb (77.1 kg)   High cholesterol  LDL no longer at goal.. she is no longer taking crestor .. as ran out  after a few week given was taking 5 mg daily... no SE.  She continues to have low back pain.. worse with standing better with sitting and  walking ongoing for years, but worse in last few months.  No new fall.  No radiation to legs.  No numbness, no weakness in legs.  In past she has tried PT home and formal.  Has tried and failed lidocaine  patches in past. Tylenol  in past no thelpful.  X-ray in 2022 showed degenerative disc disease and arthritis, no fractures    Continued  issue with sleep. Able to fall asleep 10 PM  but trouble staying asleep... wakes at 2  AM, can't go back to sleep.. wakes another few times.  Ongooing x years     Relevant past medical, surgical, family and social history reviewed and updated as indicated. Interim medical history since our last visit reviewed. Allergies and medications reviewed and updated. Outpatient Medications Prior to Visit  Medication Sig Dispense Refill   acetaminophen  (TYLENOL ) 500 MG tablet Take 500 mg by mouth every 6 (six) hours as needed.     amLODipine -olmesartan  (AZOR ) 10-40 MG tablet Take 1 tablet by mouth daily. 30 tablet 11   Continuous Glucose Sensor (FREESTYLE LIBRE 3 SENSOR) MISC Place 1 sensor on the skin every 14 days. Use to check glucose continuously 2 each 11   linaclotide  (LINZESS ) 72 MCG capsule Take 1 capsule (72 mcg total) by mouth daily. 30 capsule 3   omeprazole  (PRILOSEC) 20 MG capsule Take 1 capsule (20 mg total) by mouth daily. 30 capsule 3   ondansetron  (ZOFRAN ) 4 MG tablet Take 1 tablet (4 mg total) by mouth every 8 (eight) hours as needed for nausea or vomiting. 20 tablet 0   Semaglutide , 1 MG/DOSE, 4 MG/3ML SOPN Inject 1 mg as directed once a week. 9 mL 1   Cholecalciferol  (VITAMIN D ) 50 MCG (2000 UT) CAPS Take by mouth.     cyanocobalamin  (  VITAMIN B12) 1000 MCG tablet Take 1,000 mcg by mouth daily.     polyethylene glycol-electrolytes (NULYTELY) 420 g solution Prepare kit as directed and take by mouth as one dose. 4000 mL 0   rosuvastatin  (CRESTOR ) 5 MG tablet Take 0.5 tablets (2.5 mg total) by mouth every Monday, Wednesday, and Friday. (Patient not taking: Reported on 08/17/2023) 12 tablet 11   No facility-administered medications prior to visit.     Per HPI unless specifically indicated in ROS section below Review of Systems  Constitutional:  Negative for chills, fatigue and fever.  HENT:  Negative for congestion and ear pain.   Eyes:  Negative for pain and redness.  Respiratory:  Negative for cough and shortness of breath.   Cardiovascular:  Negative for chest pain,  palpitations and leg swelling.  Gastrointestinal:  Negative for abdominal pain, blood in stool, constipation, diarrhea, nausea and vomiting.  Genitourinary:  Negative for dysuria and vaginal bleeding.  Musculoskeletal:  Negative for back pain and myalgias.  Skin:  Negative for rash.  Neurological:  Negative for dizziness, syncope, light-headedness and headaches.  Psychiatric/Behavioral:  Negative for dysphoric mood. The patient is not nervous/anxious.    Objective:  BP (!) 140/78   Pulse 88   Temp 98.2 F (36.8 C) (Temporal)   Ht 5' 0.43 (1.535 m)   Wt 162 lb 2 oz (73.5 kg)   SpO2 96%   BMI 31.21 kg/m   Wt Readings from Last 3 Encounters:  08/17/23 162 lb 2 oz (73.5 kg)  05/23/23 167 lb (75.8 kg)  02/19/23 170 lb (77.1 kg)      Physical Exam Constitutional:      General: She is not in acute distress.    Appearance: Normal appearance. She is well-developed. She is not ill-appearing or toxic-appearing.  HENT:     Head: Normocephalic.     Right Ear: Hearing, tympanic membrane, ear canal and external ear normal. Tympanic membrane is not erythematous, retracted or bulging.     Left Ear: Hearing, tympanic membrane, ear canal and external ear normal. Tympanic membrane is not erythematous, retracted or bulging.     Nose: No mucosal edema or rhinorrhea.     Right Sinus: No maxillary sinus tenderness or frontal sinus tenderness.     Left Sinus: No maxillary sinus tenderness or frontal sinus tenderness.     Mouth/Throat:     Pharynx: Uvula midline.  Eyes:     General: Lids are normal. Lids are everted, no foreign bodies appreciated.     Conjunctiva/sclera: Conjunctivae normal.     Pupils: Pupils are equal, round, and reactive to light.  Neck:     Thyroid : No thyroid  mass or thyromegaly.     Vascular: No carotid bruit.     Trachea: Trachea normal.  Cardiovascular:     Rate and Rhythm: Normal rate and regular rhythm.     Pulses: Normal pulses.     Heart sounds: Normal heart  sounds, S1 normal and S2 normal. No murmur heard.    No friction rub. No gallop.  Pulmonary:     Effort: Pulmonary effort is normal. No tachypnea or respiratory distress.     Breath sounds: Normal breath sounds. No decreased breath sounds, wheezing, rhonchi or rales.  Abdominal:     General: Bowel sounds are normal.     Palpations: Abdomen is soft.     Tenderness: There is no abdominal tenderness.  Musculoskeletal:     Cervical back: Normal range of motion and neck supple.  Lumbar back: Tenderness present. No spasms or bony tenderness. Decreased range of motion. Negative right straight leg raise test and negative left straight leg raise test.  Skin:    General: Skin is warm and dry.     Findings: No rash.  Neurological:     Mental Status: She is alert.  Psychiatric:        Mood and Affect: Mood is not anxious or depressed.        Speech: Speech normal.        Behavior: Behavior normal. Behavior is cooperative.        Thought Content: Thought content normal.        Judgment: Judgment normal.         Results for orders placed or performed in visit on 08/13/23  Lipid panel   Collection Time: 08/13/23  9:45 AM  Result Value Ref Range   Cholesterol 176 0 - 200 mg/dL   Triglycerides 23.9 0.0 - 149.0 mg/dL   HDL 53.89 >60.99 mg/dL   VLDL 84.7 0.0 - 59.9 mg/dL   LDL Cholesterol 884 (H) 0 - 99 mg/dL   Total CHOL/HDL Ratio 4    NonHDL 130.07   Hemoglobin A1c   Collection Time: 08/13/23  9:45 AM  Result Value Ref Range   Hgb A1c MFr Bld 6.0 4.6 - 6.5 %  Comprehensive metabolic panel with GFR   Collection Time: 08/13/23  9:45 AM  Result Value Ref Range   Sodium 140 135 - 145 mEq/L   Potassium 3.9 3.5 - 5.1 mEq/L   Chloride 105 96 - 112 mEq/L   CO2 27 19 - 32 mEq/L   Glucose, Bld 86 70 - 99 mg/dL   BUN 24 (H) 6 - 23 mg/dL   Creatinine, Ser 8.90 0.40 - 1.20 mg/dL   Total Bilirubin 0.9 0.2 - 1.2 mg/dL   Alkaline Phosphatase 66 39 - 117 U/L   AST 13 0 - 37 U/L   ALT 9 0 -  35 U/L   Total Protein 6.5 6.0 - 8.3 g/dL   Albumin 3.8 3.5 - 5.2 g/dL   GFR 49.40 (L) >39.99 mL/min   Calcium  8.8 8.4 - 10.5 mg/dL   Assessment and Plan:  Type 2 diabetes mellitus with neurological complications (HCC) Assessment & Plan: Chronic,  well controlled diabetes, slight gradual weight loss Semaglutide  1 mg weekly  Orders: -     Microalbumin / creatinine urine ratio  Class 1 obesity due to excess calories with serious comorbidity and body mass index (BMI) of 31.0 to 31.9 in adult Assessment & Plan: Chronic, she is continuing to lose weight and BMI is now 31 Encouraged exercise, weight loss, healthy eating habits.  On GLP1 for DM.   Hypertension associated with diabetes [facility]) Assessment & Plan: Chronic,  borderline  control in office..  Weight loss and diet changes encouraged to help with  HTN control.   amlodipine /olmesartan  10/40 mg p.o. daily... minimal associated cough   Neuropathy due to type 2 diabetes mellitus (HCC) Assessment & Plan: Chronic, stable control   Trial of ALA 600 mg daily   Diabetic retinopathy associated with diabetes mellitus of other type, macular edema presence unspecified, unspecified laterality, unspecified retinopathy severity (HCC) Assessment & Plan:  Followed by opthamology   Hyperlipidemia associated with type 2 diabetes mellitus (HCC) Assessment & Plan: Chronic, statin indicated.  She is no longer taking Crestor  given dosing misunderstanding.   Will restart at crestor  5 mg daily.    Chronic midline low  back pain without sciatica Assessment & Plan:  Chronic, now with acute flare but  no new injury.  In past she has tried PT home and formal.  Has tried and failed lidocaine  patches in past. Tylenol  in past no thelpful.  X-ray in 2022 showed degenerative disc disease and arthritis, no fractures   Repeat X-ray for uptodate eval.  Start meloxicam  and continue heat and home PT.  Follow up if not improving in 6-8  weeks.  Orders: -     DG Lumbar Spine Complete; Future  Chronic insomnia  Other orders -     Rosuvastatin  Calcium ; Take 1 tablet (5 mg total) by mouth every Monday, Wednesday, and Friday.  Dispense: 30 tablet; Refill: 11 -     Meloxicam ; Take 1 tablet (15 mg total) by mouth daily.  Dispense: 30 tablet; Refill: 0 -     traZODone HCl; Take 0.5-1 tablets (25-50 mg total) by mouth at bedtime as needed for sleep.  Dispense: 30 tablet; Refill: 3

## 2023-08-17 NOTE — Assessment & Plan Note (Signed)
 Chronic, now with acute flare but  no new injury.  In past she has tried PT home and formal.  Has tried and failed lidocaine  patches in past. Tylenol  in past no thelpful.  X-ray in 2022 showed degenerative disc disease and arthritis, no fractures   Repeat X-ray for uptodate eval.  Start meloxicam  and continue heat and home PT.  Follow up if not improving in 6-8 weeks.

## 2023-08-17 NOTE — Assessment & Plan Note (Signed)
Chronic, stable control   Trial of ALA 600 mg daily

## 2023-08-17 NOTE — Assessment & Plan Note (Signed)
Followed by opthamology.

## 2023-08-20 ENCOUNTER — Encounter: Payer: Self-pay | Admitting: Family Medicine

## 2023-08-22 ENCOUNTER — Ambulatory Visit: Admitting: Family Medicine

## 2023-08-27 ENCOUNTER — Other Ambulatory Visit: Payer: Self-pay

## 2023-08-28 ENCOUNTER — Other Ambulatory Visit: Payer: Self-pay

## 2023-08-28 MED ORDER — TETANUS-DIPHTH-ACELL PERTUSSIS 5-2.5-18.5 LF-MCG/0.5 IM SUSY
0.5000 mL | PREFILLED_SYRINGE | Freq: Once | INTRAMUSCULAR | 0 refills | Status: AC
  Filled 2023-08-28: qty 0.5, 1d supply, fill #0

## 2023-09-06 ENCOUNTER — Other Ambulatory Visit: Payer: Self-pay

## 2023-09-07 ENCOUNTER — Ambulatory Visit
Admission: RE | Admit: 2023-09-07 | Discharge: 2023-09-07 | Disposition: A | Discharge status: Home / Self Care | Attending: Emergency Medicine | Admitting: Emergency Medicine

## 2023-09-07 ENCOUNTER — Telehealth: Payer: Self-pay | Admitting: Family Medicine

## 2023-09-07 VITALS — BP 116/71 | HR 96 | Temp 99.9°F | Resp 18

## 2023-09-07 DIAGNOSIS — U071 COVID-19: Secondary | ICD-10-CM

## 2023-09-07 LAB — POC SOFIA SARS ANTIGEN FIA: SARS Coronavirus 2 Ag: POSITIVE — AB

## 2023-09-07 MED ORDER — PAXLOVID (150/100) 10 X 150 MG & 10 X 100MG PO TBPK: 2.0000 | ORAL_TABLET | Freq: Two times a day (BID) | ORAL | 0 refills | Status: AC
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
## 2023-09-07 NOTE — Discharge Instructions (Addendum)
 Take the Paxlovid  as directed.  Take Tylenol  as needed for fever or discomfort.  Rest and keep yourself hydrated.    Stop your rosuvastatin  for your cholesterol while taking Paxlovid  and for 5 additional days after the Paxlovid  is finished.   Do not take trazodone  while taking Paxlovid .    Go to the emergency department if you have shortness of breath or other concerning symptoms.    Contact your primary care provider to let them know that you are COVID positive and taking Paxlovid .

## 2023-09-07 NOTE — ED Triage Notes (Signed)
 Patient to Urgent Care with complaints of cough/ runny nose/ chills/ sneezing/ temps 99.9.   Symptoms x2 days.   Taking flonase/ tylenol .

## 2023-09-07 NOTE — Telephone Encounter (Signed)
 Received from [facility] PAP:  Ozempic  4 mg/3 ml Lot# [id number] Exp: 11/29/2025 x 4 boxes.  Left message for Patricia Horne that medication is here at the office ready for pick up.

## 2023-09-10 ENCOUNTER — Ambulatory Visit: Payer: Self-pay

## 2023-09-10 ENCOUNTER — Other Ambulatory Visit: Payer: Self-pay

## 2023-09-10 ENCOUNTER — Encounter: Payer: Self-pay | Admitting: Family Medicine

## 2023-09-10 DIAGNOSIS — E1149 Type 2 diabetes mellitus with other diabetic neurological complication: Secondary | ICD-10-CM

## 2023-09-10 NOTE — Telephone Encounter (Signed)
 FYI Only or Action Required?: Action required by provider: clinical question for provider.  Patient was last seen in primary care on 08/17/2023 by Avelina Greig BRAVO, MD.  Called Nurse Triage reporting Hypoglycemia.  Symptoms began several days ago.  Interventions attempted: Rest, hydration, or home remedies.  Symptoms are: unchanged.  Triage Disposition: Call PCP Now-questioning if Paxlovid  could be causing low blood sugars.   Patient/caregiver understands and will follow disposition?: Yes  Copied from CRM [id number]. Topic: Clinical - Medication Question >> Sep 10, 2023  2:37 PM Viola F wrote: Reason for CRM: Patient wants to know could the Paxlovid  medication make her blood sugars run low? (54) Reason for Disposition  [1] Caller has URGENT medication or insulin  device (e.g., pump, continuous monitoring) question AND [2] triager unable to answer question  Answer Assessment - Initial Assessment Questions 1. SYMPTOMS: What symptoms are you concerned about?     No symptoms with the blood sugar readings.  2. ONSET:  When did the symptoms start?     Started shortly after she started taking Paxlovid .  3. BLOOD GLUCOSE: What is your blood glucose level?      Blood sugar currently at 97-patient eat chicken soup with cabbage. 4. USUAL RANGE: What is your blood glucose level usually? (e.g., usual fasting morning value, usual evening value)     150 5. TYPE 1 or 2:  Do you know what type of diabetes you have?  (e.g., Type 1, Type 2, Gestational; doesn't know)      Type 2  6. INSULIN : Do you take insulin ? What type of insulin (s) do you use? What is the mode of delivery? (syringe, pen; injection or pump) When did you last give yourself an insulin  dose? (i.e., time or hours/minutes ago) How much did you give? (i.e., how many units)     Semaglutide  1MG  7. DIABETES PILLS: Do you take any pills for your diabetes? If Yes, ask: What is the name of the medicine(s) that you take for  high blood sugar?     No pills 8. OTHER SYMPTOMS: Do you have any symptoms? (e.g., fever, frequent urination, difficulty breathing, vomiting)     No symptoms 9. LOW BLOOD GLUCOSE TREATMENT: What have you done so far to treat the low blood glucose level?     Patient reports eating a snack and drinking juice 10. FOOD: When did you last eat or drink?       Eat lunch prior to calling triage 11. ALONE: Are you alone right now or is someone with you?        Lives by herself.  Patient reports that she has been having low blood sugar levels in the mid 50s since starting Paxlovid . Patient is unsure of why her blood sugars having been on the lower side. Concerned that Paxlovid  is causing her blood sugar to drop. Patient's currently blood sugar is 103. Patient is unsure if her sensor could be wrong. Patient reports no signs and symptoms of low blood sugar. Patient states her blood sugar sensor will alarm at night and keep her up. Patient doesn't have a monitoring kit for her to check her blood sugar by pricking her finger. Information given to patient of where she can find a glucose monitoring kit. Patient is just wanting some guidance from provider.  Protocols used: Diabetes - Low Blood Sugar-A-AH

## 2023-09-10 NOTE — Telephone Encounter (Signed)
 Unable to reach patient by phone and left v/m requesting call back at [phone]. I also sent my chart note asking pt to call [phone] for additonal info and for pt to be triaged. Sending note to [facility] pool and [facility] triage.

## 2023-09-11 DIAGNOSIS — E113511 Type 2 diabetes mellitus with proliferative diabetic retinopathy with macular edema, right eye: Secondary | ICD-10-CM | POA: Diagnosis not present

## 2023-09-11 DIAGNOSIS — Z961 Presence of intraocular lens: Secondary | ICD-10-CM | POA: Diagnosis not present

## 2023-09-11 DIAGNOSIS — E113552 Type 2 diabetes mellitus with stable proliferative diabetic retinopathy, left eye: Secondary | ICD-10-CM | POA: Diagnosis not present

## 2023-09-11 NOTE — Telephone Encounter (Signed)
 Responded via MyChart.

## 2023-09-12 MED ORDER — ACCU-CHEK GUIDE TEST VI STRP: ORAL_STRIP | 3 refills | Status: DC

## 2023-09-12 MED ORDER — ACCU-CHEK SOFTCLIX LANCETS MISC: 3 refills | Status: DC

## 2023-09-12 MED ORDER — ACCU-CHEK GUIDE ME W/DEVICE KIT
PACK | 0 refills | Status: DC
Start: 2023-09-12 — End: 2023-09-14

## 2023-09-12 MED ORDER — ACCU-CHEK SOFTCLIX LANCET DEV KIT: PACK | 0 refills | Status: AC

## 2023-09-12 NOTE — Telephone Encounter (Addendum)
 Patricia Horne notified as instructed by telephone.  She needs glucose testing supplies sent to [facility] [address]  Prescriptons sent as requested.   Faxed to [facility] at [phone].

## 2023-09-12 NOTE — Addendum Note (Signed)
 Addended by: WENDELL ARLAND RAMAN on: 09/12/2023 08:58 AM   Modules accepted: Orders

## 2023-09-14 ENCOUNTER — Telehealth: Payer: Self-pay | Admitting: *Deleted

## 2023-09-14 ENCOUNTER — Telehealth: Payer: Self-pay

## 2023-09-14 ENCOUNTER — Other Ambulatory Visit (HOSPITAL_COMMUNITY): Payer: Self-pay

## 2023-09-14 DIAGNOSIS — E1149 Type 2 diabetes mellitus with other diabetic neurological complication: Secondary | ICD-10-CM

## 2023-09-14 MED ORDER — ONETOUCH ULTRA TEST VI STRP: ORAL_STRIP | 3 refills | Status: AC

## 2023-09-14 MED ORDER — ONETOUCH ULTRASOFT LANCETS MISC
3 refills | Status: AC
Start: 2023-09-14 — End: ?

## 2023-09-14 MED ORDER — ONETOUCH ULTRA 2 W/DEVICE KIT: PACK | 0 refills | Status: AC

## 2023-09-14 NOTE — Telephone Encounter (Signed)
 One-Touch Ultra diabetic testing supplies sent to [facility] [address] as requested.   Left message for Iliza that prescriptions for been sent to her pharmacy.

## 2023-09-14 NOTE — Telephone Encounter (Signed)
 Copied from CRM [id number]. Topic: Clinical - Order For Equipment >> Sep 14, 2023 11:36 AM Patricia Horne wrote: Reason for CRM: Pt called in requesting for PCP Patricia Horne to send her a glaucoma one touch machine. Pt stated that the wrong one was put in and her insurance will not pay for it so she will not be able to receive the machine order that was put in.

## 2023-09-14 NOTE — Telephone Encounter (Signed)
 Pharmacy Patient Advocate Encounter   Received notification from Onbase that prior authorization for Accu-Chek Softclix Lancets is required/requested.   Insurance verification completed.   The patient is insured through [facility] ADVANTAGE/RX ADVANCE .   Per test claim: PA required; PA submitted to above mentioned insurance via Latent Key/confirmation #/EOC [id number] Status is pending

## 2023-09-17 ENCOUNTER — Other Ambulatory Visit (HOSPITAL_COMMUNITY): Payer: Self-pay

## 2023-09-17 NOTE — Telephone Encounter (Signed)
 Pharmacy Patient Advocate Encounter  Received notification from [employer] that Prior Authorization for Accu-Chek Softclix Lancets has been DENIED.  It has been approved under Medicare Part B. I left a message with patient [facility]) to rerun under Medicare Part B.   PA #/Case ID/Reference #: [id number]

## 2023-09-17 NOTE — Telephone Encounter (Signed)
 Patient called last week asking that we change her diabetic testing supplies to One-Touch supplies due to that is what her insurance will cover.  Prescriptions for one-touch supplies where sent to her pharmacy as requested.

## 2023-09-18 ENCOUNTER — Other Ambulatory Visit: Payer: Self-pay

## 2023-09-25 ENCOUNTER — Other Ambulatory Visit: Payer: Self-pay

## 2023-09-25 DIAGNOSIS — E113552 Type 2 diabetes mellitus with stable proliferative diabetic retinopathy, left eye: Secondary | ICD-10-CM | POA: Diagnosis not present

## 2023-09-26 ENCOUNTER — Other Ambulatory Visit: Payer: Self-pay

## 2023-09-27 NOTE — Telephone Encounter (Signed)
 Called patient she will be by today to pick up.

## 2023-09-27 NOTE — Telephone Encounter (Signed)
 Picked up.

## 2023-10-04 ENCOUNTER — Other Ambulatory Visit: Payer: Self-pay

## 2023-10-04 ENCOUNTER — Other Ambulatory Visit: Payer: Self-pay | Admitting: Family Medicine

## 2023-10-04 MED ORDER — AMLODIPINE-OLMESARTAN 10-40 MG PO TABS
1.0000 | ORAL_TABLET | Freq: Every day | ORAL | 11 refills | Status: AC
  Filled 2023-10-04: qty 30, 30d supply, fill #0
  Filled 2023-11-13: qty 30, 30d supply, fill #1

## 2023-10-10 ENCOUNTER — Other Ambulatory Visit: Payer: Self-pay

## 2023-10-16 ENCOUNTER — Other Ambulatory Visit: Payer: Self-pay

## 2023-10-25 ENCOUNTER — Ambulatory Visit

## 2023-10-26 ENCOUNTER — Ambulatory Visit (INDEPENDENT_AMBULATORY_CARE_PROVIDER_SITE_OTHER): Admitting: Family Medicine

## 2023-10-26 ENCOUNTER — Encounter: Payer: Self-pay | Admitting: Family Medicine

## 2023-10-26 VITALS — BP 150/76 | HR 83 | Temp 98.4°F | Ht 60.43 in | Wt 159.2 lb

## 2023-10-26 DIAGNOSIS — Z862 Personal history of diseases of the blood and blood-forming organs and certain disorders involving the immune mechanism: Secondary | ICD-10-CM | POA: Diagnosis not present

## 2023-10-26 DIAGNOSIS — E1159 Type 2 diabetes mellitus with other circulatory complications: Secondary | ICD-10-CM | POA: Diagnosis not present

## 2023-10-26 DIAGNOSIS — R351 Nocturia: Secondary | ICD-10-CM | POA: Diagnosis not present

## 2023-10-26 DIAGNOSIS — R5383 Other fatigue: Secondary | ICD-10-CM

## 2023-10-26 DIAGNOSIS — E113511 Type 2 diabetes mellitus with proliferative diabetic retinopathy with macular edema, right eye: Secondary | ICD-10-CM | POA: Diagnosis not present

## 2023-10-26 DIAGNOSIS — I152 Hypertension secondary to endocrine disorders: Secondary | ICD-10-CM

## 2023-10-26 NOTE — Assessment & Plan Note (Signed)
 Acute, no clear infectious cause.  No clear cardiopulmonary source. Not clearly secondary to post COVID. Will evaluate for labs for secondary cause.  Return and ER precautions provided.

## 2023-10-26 NOTE — Assessment & Plan Note (Signed)
 Chronic, ongoing frequency at night interfering with sleep.  Sleep not restful could be contributing to fatigue.  Discussed consideration of trial of Gemtesa.  She is not interested at this time.

## 2023-10-26 NOTE — Progress Notes (Signed)
 Patient ID: Patricia Horne, female    DOB: [birth year], 73 y.o.   MRN: [id number]  This visit was conducted in person.  BP (!) 150/76   Pulse 83   Temp 98.4 F (36.9 C) (Temporal)   Ht 5' 0.43 (1.535 m)   Wt 159 lb 4 oz (72.2 kg)   SpO2 99%   BMI 30.66 kg/m    CC:  Chief Complaint  Patient presents with   Fatigue    X 1 week/Post Covid 09/07/23 Stopped taking all her medication except for her Ozempic     Subjective:   HPI: Patricia Horne is a 73 y.o. female presenting on 10/26/2023 for Fatigue (X 1 week/Post Covid 09/07/23/Stopped taking all her medication except for her Ozempic )   She has been feeling very tired 1 month out from COVID infection.  More significant fatigue in last week.  Getting 6 hours at night... not restful given OAB and frequency of urination at night. No new bleeding  No CP, no SOB change.  No new meds.  Eating and drinking well.  CGM occ low blood sugr.SABRA last night 68-74.. did not verify with finger stick.  Lab Results  Component Value Date   HGBA1C 6.0 08/13/2023   Wt Readings from Last 3 Encounters:  10/26/23 159 lb 4 oz (72.2 kg)  08/17/23 162 lb 2 oz (73.5 kg)  05/23/23 167 lb (75.8 kg)      Denies depression.  BP elevated as she has not been taking her meds since having COVID.    Had injection in right eye... has resulting conjunctiva hemorrhage.   Relevant past medical, surgical, family and social history reviewed and updated as indicated. Interim medical history since our last visit reviewed. Allergies and medications reviewed and updated. Outpatient Medications Prior to Visit  Medication Sig Dispense Refill   Semaglutide , 1 MG/DOSE, 4 MG/3ML SOPN Inject 1 mg as directed once a week. 9 mL 1   acetaminophen  (TYLENOL ) 500 MG tablet Take 500 mg by mouth every 6 (six) hours as needed.     amLODipine -olmesartan  (AZOR ) 10-40 MG tablet Take 1 tablet by mouth daily. 30 tablet 11   Blood Glucose Monitoring Suppl (ONE TOUCH ULTRA 2)  w/Device KIT Use to check blood sugar once daily 1 kit 0   Continuous Glucose Sensor (FREESTYLE LIBRE 3 SENSOR) MISC Place 1 sensor on the skin every 14 days. Use to check glucose continuously 2 each 11   glucose blood (ONETOUCH ULTRA TEST) test strip Use to check blood sugar once daily 100 each 3   Lancets (ONETOUCH ULTRASOFT) lancets Use to check blood sugar once daily 100 each 3   Lancets Misc. (ACCU-CHEK SOFTCLIX LANCET DEV) KIT Use to check blood sugar once dialy 1 kit 0   linaclotide  (LINZESS ) 72 MCG capsule Take 1 capsule (72 mcg total) by mouth daily. 30 capsule 3   meloxicam  (MOBIC ) 15 MG tablet Take 1 tablet (15 mg total) by mouth daily. 30 tablet 0   omeprazole  (PRILOSEC) 20 MG capsule Take 1 capsule (20 mg total) by mouth daily. 30 capsule 3   ondansetron  (ZOFRAN ) 4 MG tablet Take 1 tablet (4 mg total) by mouth every 8 (eight) hours as needed for nausea or vomiting. 20 tablet 0   rosuvastatin  (CRESTOR ) 5 MG tablet Take 1 tablet (5 mg total) by mouth every Monday, Wednesday, and Friday. 30 tablet 11   traZODone  (DESYREL ) 50 MG tablet Take 0.5-1 tablets (25-50 mg total) by mouth at bedtime as  needed for sleep. 30 tablet 3   No facility-administered medications prior to visit.     Per HPI unless specifically indicated in ROS section below Review of Systems  Constitutional:  Positive for fatigue. Negative for fever.  HENT:  Negative for congestion.   Eyes:  Negative for pain.  Respiratory:  Negative for cough and shortness of breath.   Cardiovascular:  Negative for chest pain, palpitations and leg swelling.  Gastrointestinal:  Negative for abdominal pain.  Genitourinary:  Negative for dysuria and vaginal bleeding.  Musculoskeletal:  Negative for back pain.  Neurological:  Negative for syncope, light-headedness and headaches.  Psychiatric/Behavioral:  Negative for dysphoric mood.    Objective:  BP (!) 150/76   Pulse 83   Temp 98.4 F (36.9 C) (Temporal)   Ht 5' 0.43 (1.535 m)    Wt 159 lb 4 oz (72.2 kg)   SpO2 99%   BMI 30.66 kg/m   Wt Readings from Last 3 Encounters:  10/26/23 159 lb 4 oz (72.2 kg)  08/17/23 162 lb 2 oz (73.5 kg)  05/23/23 167 lb (75.8 kg)      Physical Exam Constitutional:      General: She is not in acute distress.    Appearance: Normal appearance. She is well-developed. She is not ill-appearing or toxic-appearing.  HENT:     Head: Normocephalic.     Right Ear: Hearing, tympanic membrane, ear canal and external ear normal. Tympanic membrane is not erythematous, retracted or bulging.     Left Ear: Hearing, tympanic membrane, ear canal and external ear normal. Tympanic membrane is not erythematous, retracted or bulging.     Nose: No mucosal edema or rhinorrhea.     Right Sinus: No maxillary sinus tenderness or frontal sinus tenderness.     Left Sinus: No maxillary sinus tenderness or frontal sinus tenderness.     Mouth/Throat:     Pharynx: Uvula midline.  Eyes:     General: Lids are normal. Lids are everted, no foreign bodies appreciated.     Conjunctiva/sclera:     Right eye: Hemorrhage present.     Pupils: Pupils are equal, round, and reactive to light.  Neck:     Thyroid : No thyroid  mass or thyromegaly.     Vascular: No carotid bruit.     Trachea: Trachea normal.  Cardiovascular:     Rate and Rhythm: Normal rate and regular rhythm.     Pulses: Normal pulses.     Heart sounds: Normal heart sounds, S1 normal and S2 normal. No murmur heard.    No friction rub. No gallop.  Pulmonary:     Effort: Pulmonary effort is normal. No tachypnea or respiratory distress.     Breath sounds: Normal breath sounds. No decreased breath sounds, wheezing, rhonchi or rales.  Abdominal:     General: Bowel sounds are normal.     Palpations: Abdomen is soft.     Tenderness: There is no abdominal tenderness.  Musculoskeletal:     Cervical back: Normal range of motion and neck supple.  Skin:    General: Skin is warm and dry.     Findings: No rash.   Neurological:     Mental Status: She is alert.  Psychiatric:        Mood and Affect: Mood is not anxious or depressed.        Speech: Speech normal.        Behavior: Behavior normal. Behavior is cooperative.        Thought  Content: Thought content normal.        Judgment: Judgment normal.       Results for orders placed or performed in visit on 10/26/23  CBC with Differential/Platelet   Collection Time: 10/26/23  3:21 PM  Result Value Ref Range   WBC 6.8 3.8 - 10.8 Thousand/uL   RBC 3.94 3.80 - 5.10 Million/uL   Hemoglobin 12.8 11.7 - 15.5 g/dL   HCT 62.4 64.9 - 54.9 %   MCV 95.2 80.0 - 100.0 fL   MCH 32.5 27.0 - 33.0 pg   MCHC 34.1 32.0 - 36.0 g/dL   RDW 87.2 88.9 - 84.9 %   Platelets 256 140 - 400 Thousand/uL   MPV 11.1 7.5 - 12.5 fL   Neutro Abs 4,114 1,500 - 7,800 cells/uL   Absolute Lymphocytes 1,877 850 - 3,900 cells/uL   Absolute Monocytes 571 200 - 950 cells/uL   Eosinophils Absolute 190 15 - 500 cells/uL   Basophils Absolute 48 0 - 200 cells/uL   Neutrophils Relative % 60.5 %   Total Lymphocyte 27.6 %   Monocytes Relative 8.4 %   Eosinophils Relative 2.8 %   Basophils Relative 0.7 %  Comprehensive metabolic panel with GFR   Collection Time: 10/26/23  3:21 PM  Result Value Ref Range   Glucose, Bld 100 (H) 65 - 99 mg/dL   BUN 24 7 - 25 mg/dL   Creat 8.99 9.39 - 8.99 mg/dL   eGFR 59 (L) > OR = 60 mL/min/1.4m2   BUN/Creatinine Ratio SEE NOTE: 6 - 22 (calc)   Sodium 141 135 - 146 mmol/L   Potassium 4.7 3.5 - 5.3 mmol/L   Chloride 107 98 - 110 mmol/L   CO2 28 20 - 32 mmol/L   Calcium  9.4 8.6 - 10.4 mg/dL   Total Protein 7.0 6.1 - 8.1 g/dL   Albumin 3.9 3.6 - 5.1 g/dL   Globulin 3.1 1.9 - 3.7 g/dL (calc)   AG Ratio 1.3 1.0 - 2.5 (calc)   Total Bilirubin 0.5 0.2 - 1.2 mg/dL   Alkaline phosphatase (APISO) 73 37 - 153 U/L   AST 15 10 - 35 U/L   ALT 9 6 - 29 U/L  Iron, TIBC and Ferritin Panel   Collection Time: 10/26/23  3:21 PM  Result Value Ref Range    Iron 69 45 - 160 mcg/dL   TIBC 760 (L) 749 - 549 mcg/dL (calc)   %SAT 29 16 - 45 % (calc)   Ferritin 420 (H) 16 - 288 ng/mL  T3, free   Collection Time: 10/26/23  3:21 PM  Result Value Ref Range   T3, Free 3.1 2.3 - 4.2 pg/mL  T4, free   Collection Time: 10/26/23  3:21 PM  Result Value Ref Range   Free T4 1.4 0.8 - 1.8 ng/dL  TSH   Collection Time: 10/26/23  3:21 PM  Result Value Ref Range   TSH 0.84 0.40 - 4.50 mIU/L  Vitamin B12   Collection Time: 10/26/23  3:21 PM  Result Value Ref Range   Vitamin B-12 374 200 - 1,100 pg/mL  VITAMIN D  25 Hydroxy (Vit-D Deficiency, Fractures)   Collection Time: 10/26/23  3:21 PM  Result Value Ref Range   Vit D, 25-Hydroxy 36 30 - 100 ng/mL    Assessment and Plan  Other fatigue Assessment & Plan: Acute, no clear infectious cause.  No clear cardiopulmonary source. Not clearly secondary to post COVID. Will evaluate for labs for secondary cause.  Return  and ER precautions provided.  Orders: -     CBC with Differential/Platelet -     Comprehensive metabolic panel with GFR -     Iron, TIBC and Ferritin Panel -     T3, free -     T4, free -     TSH -     Vitamin B12 -     VITAMIN D  25 Hydroxy (Vit-D Deficiency, Fractures)  Nocturia Assessment & Plan: Chronic, ongoing frequency at night interfering with sleep.  Sleep not restful could be contributing to fatigue.  Discussed consideration of trial of Gemtesa.  She is not interested at this time.   Hypertension associated with diabetes [facility]) Assessment & Plan: Poor control due to patient not taking her medication.  We discussed this in detail and she will restart.     No follow-ups on file.   Greig Ring, MD

## 2023-10-26 NOTE — Assessment & Plan Note (Signed)
 Poor control due to patient not taking her medication.  We discussed this in detail and she will restart.

## 2023-10-27 LAB — COMPREHENSIVE METABOLIC PANEL WITH GFR
AG Ratio: 1.3 (calc) (ref 1.0–2.5)
ALT: 9 U/L (ref 6–29)
AST: 15 U/L (ref 10–35)
Albumin: 3.9 g/dL (ref 3.6–5.1)
Alkaline phosphatase (APISO): 73 U/L (ref 37–153)
BUN: 24 mg/dL (ref 7–25)
CO2: 28 mmol/L (ref 20–32)
Calcium: 9.4 mg/dL (ref 8.6–10.4)
Chloride: 107 mmol/L (ref 98–110)
Creat: 1 mg/dL (ref 0.60–1.00)
Globulin: 3.1 g/dL (ref 1.9–3.7)
Glucose, Bld: 100 mg/dL — ABNORMAL HIGH (ref 65–99)
Potassium: 4.7 mmol/L (ref 3.5–5.3)
Sodium: 141 mmol/L (ref 135–146)
Total Bilirubin: 0.5 mg/dL (ref 0.2–1.2)
Total Protein: 7 g/dL (ref 6.1–8.1)
eGFR: 59 mL/min/1.73m2 — ABNORMAL LOW (ref >=60)

## 2023-10-27 LAB — CBC WITH DIFFERENTIAL/PLATELET
Absolute Lymphocytes: 1877 {cells}/uL (ref 850–3900)
Absolute Monocytes: 571 {cells}/uL (ref 200–950)
Basophils Absolute: 48 {cells}/uL (ref 0–200)
Basophils Relative: 0.7 %
Eosinophils Absolute: 190 {cells}/uL (ref 15–500)
Eosinophils Relative: 2.8 %
HCT: 37.5 % (ref 35.0–45.0)
Hemoglobin: 12.8 g/dL (ref 11.7–15.5)
MCH: 32.5 pg (ref 27.0–33.0)
MCHC: 34.1 g/dL (ref 32.0–36.0)
MCV: 95.2 fL (ref 80.0–100.0)
MPV: 11.1 fL (ref 7.5–12.5)
Monocytes Relative: 8.4 %
Neutro Abs: 4114 {cells}/uL (ref 1500–7800)
Neutrophils Relative %: 60.5 %
Platelets: 256 Thousand/uL (ref 140–400)
RBC: 3.94 Million/uL (ref 3.80–5.10)
RDW: 12.7 % (ref 11.0–15.0)
Total Lymphocyte: 27.6 %
WBC: 6.8 Thousand/uL (ref 3.8–10.8)

## 2023-10-27 LAB — VITAMIN B12: Vitamin B-12: 374 pg/mL (ref 200–1100)

## 2023-10-27 LAB — T3, FREE: T3, Free: 3.1 pg/mL (ref 2.3–4.2)

## 2023-10-27 LAB — T4, FREE: Free T4: 1.4 ng/dL (ref 0.8–1.8)

## 2023-10-27 LAB — IRON,TIBC AND FERRITIN PANEL
%SAT: 29 % (ref 16–45)
Ferritin: 420 ng/mL — ABNORMAL HIGH (ref 16–288)
Iron: 69 ug/dL (ref 45–160)
TIBC: 239 ug/dL — ABNORMAL LOW (ref 250–450)

## 2023-10-27 LAB — VITAMIN D 25 HYDROXY (VIT D DEFICIENCY, FRACTURES): Vit D, 25-Hydroxy: 36 ng/mL (ref 30–100)

## 2023-10-27 LAB — TSH: TSH: 0.84 m[IU]/L (ref 0.40–4.50)

## 2023-10-30 ENCOUNTER — Telehealth: Payer: Self-pay | Admitting: *Deleted

## 2023-10-30 ENCOUNTER — Ambulatory Visit: Payer: Self-pay | Admitting: Family Medicine

## 2023-10-30 DIAGNOSIS — E113552 Type 2 diabetes mellitus with stable proliferative diabetic retinopathy, left eye: Secondary | ICD-10-CM | POA: Diagnosis not present

## 2023-10-30 DIAGNOSIS — E1149 Type 2 diabetes mellitus with other diabetic neurological complication: Secondary | ICD-10-CM

## 2023-10-30 NOTE — Telephone Encounter (Signed)
-----   Message from Veva JINNY Ferrari sent at 10/30/2023 11:18 AM EDT ----- Regarding: Lab orders for Tue, 10.14.25 Lab orders for a 3 month follow up appt.

## 2023-11-05 ENCOUNTER — Encounter: Payer: Self-pay | Admitting: Pharmacist

## 2023-11-09 ENCOUNTER — Emergency Department

## 2023-11-09 ENCOUNTER — Encounter: Payer: Self-pay | Admitting: Emergency Medicine

## 2023-11-09 ENCOUNTER — Other Ambulatory Visit: Payer: Self-pay

## 2023-11-09 ENCOUNTER — Inpatient Hospital Stay
Admission: EM | Admit: 2023-11-09 | Discharge: 2023-11-12 | DRG: 683 | Disposition: A | Discharge status: Home / Self Care | Attending: Internal Medicine | Admitting: Internal Medicine

## 2023-11-09 DIAGNOSIS — N10 Acute tubulo-interstitial nephritis: Secondary | ICD-10-CM | POA: Diagnosis present

## 2023-11-09 DIAGNOSIS — I5032 Chronic diastolic (congestive) heart failure: Secondary | ICD-10-CM | POA: Diagnosis not present

## 2023-11-09 DIAGNOSIS — N179 Acute kidney failure, unspecified: Secondary | ICD-10-CM | POA: Diagnosis not present

## 2023-11-09 DIAGNOSIS — E1159 Type 2 diabetes mellitus with other circulatory complications: Secondary | ICD-10-CM | POA: Diagnosis not present

## 2023-11-09 DIAGNOSIS — E1122 Type 2 diabetes mellitus with diabetic chronic kidney disease: Secondary | ICD-10-CM | POA: Diagnosis present

## 2023-11-09 DIAGNOSIS — E1149 Type 2 diabetes mellitus with other diabetic neurological complication: Secondary | ICD-10-CM | POA: Diagnosis not present

## 2023-11-09 DIAGNOSIS — F4310 Post-traumatic stress disorder, unspecified: Secondary | ICD-10-CM | POA: Diagnosis not present

## 2023-11-09 DIAGNOSIS — R10A2 Flank pain, left side: Secondary | ICD-10-CM

## 2023-11-09 DIAGNOSIS — Z6831 Body mass index (BMI) 31.0-31.9, adult: Secondary | ICD-10-CM

## 2023-11-09 DIAGNOSIS — Z833 Family history of diabetes mellitus: Secondary | ICD-10-CM | POA: Diagnosis not present

## 2023-11-09 DIAGNOSIS — E785 Hyperlipidemia, unspecified: Secondary | ICD-10-CM | POA: Diagnosis present

## 2023-11-09 DIAGNOSIS — Z79899 Other long term (current) drug therapy: Secondary | ICD-10-CM

## 2023-11-09 DIAGNOSIS — I10 Essential (primary) hypertension: Secondary | ICD-10-CM | POA: Diagnosis not present

## 2023-11-09 DIAGNOSIS — Z881 Allergy status to other antibiotic agents status: Secondary | ICD-10-CM | POA: Diagnosis not present

## 2023-11-09 DIAGNOSIS — N12 Tubulo-interstitial nephritis, not specified as acute or chronic: Principal | ICD-10-CM

## 2023-11-09 DIAGNOSIS — I152 Hypertension secondary to endocrine disorders: Secondary | ICD-10-CM | POA: Diagnosis not present

## 2023-11-09 DIAGNOSIS — E1140 Type 2 diabetes mellitus with diabetic neuropathy, unspecified: Secondary | ICD-10-CM | POA: Diagnosis present

## 2023-11-09 DIAGNOSIS — N182 Chronic kidney disease, stage 2 (mild): Secondary | ICD-10-CM | POA: Diagnosis present

## 2023-11-09 DIAGNOSIS — Z794 Long term (current) use of insulin: Secondary | ICD-10-CM | POA: Diagnosis not present

## 2023-11-09 DIAGNOSIS — K219 Gastro-esophageal reflux disease without esophagitis: Secondary | ICD-10-CM | POA: Diagnosis present

## 2023-11-09 DIAGNOSIS — E11319 Type 2 diabetes mellitus with unspecified diabetic retinopathy without macular edema: Secondary | ICD-10-CM | POA: Diagnosis present

## 2023-11-09 DIAGNOSIS — F5104 Psychophysiologic insomnia: Secondary | ICD-10-CM | POA: Diagnosis not present

## 2023-11-09 DIAGNOSIS — Z791 Long term (current) use of non-steroidal anti-inflammatories (NSAID): Secondary | ICD-10-CM | POA: Diagnosis not present

## 2023-11-09 DIAGNOSIS — Z7984 Long term (current) use of oral hypoglycemic drugs: Secondary | ICD-10-CM

## 2023-11-09 DIAGNOSIS — I700 Atherosclerosis of aorta: Secondary | ICD-10-CM | POA: Diagnosis not present

## 2023-11-09 DIAGNOSIS — R1032 Left lower quadrant pain: Secondary | ICD-10-CM | POA: Diagnosis not present

## 2023-11-09 LAB — URINALYSIS, ROUTINE W REFLEX MICROSCOPIC
Bilirubin Urine: NEGATIVE
Glucose, UA: NEGATIVE mg/dL
Ketones, ur: NEGATIVE mg/dL
Leukocytes,Ua: NEGATIVE
Nitrite: NEGATIVE
Protein, ur: 100 mg/dL — AB
Specific Gravity, Urine: 1.017 (ref 1.005–1.030)
pH: 5 (ref 5.0–8.0)

## 2023-11-09 LAB — COMPREHENSIVE METABOLIC PANEL WITH GFR
ALT: 10 U/L (ref 0–44)
ALT: 11 U/L (ref 0–44)
AST: 24 U/L (ref 15–41)
AST: 21 U/L (ref 15–41)
Albumin: 3.6 g/dL (ref 3.5–5.0)
Albumin: 3.2 g/dL — ABNORMAL LOW (ref 3.5–5.0)
Alkaline Phosphatase: 59 U/L (ref 38–126)
Alkaline Phosphatase: 51 U/L (ref 38–126)
Anion gap: 17 — ABNORMAL HIGH (ref 5–15)
Anion gap: 13 (ref 5–15)
BUN: UNDETERMINED mg/dL (ref 8–23)
BUN: 29 mg/dL — ABNORMAL HIGH (ref 8–23)
CO2: 16 mmol/L — ABNORMAL LOW (ref 22–32)
CO2: 23 mmol/L (ref 22–32)
Calcium: 9.1 mg/dL (ref 8.9–10.3)
Calcium: 9.1 mg/dL (ref 8.9–10.3)
Chloride: 106 mmol/L (ref 98–111)
Chloride: 105 mmol/L (ref 98–111)
Creatinine, Ser: UNDETERMINED mg/dL (ref 0.44–1.00)
Creatinine, Ser: 2.02 mg/dL — ABNORMAL HIGH (ref 0.44–1.00)
GFR, Estimated: 26 mL/min — ABNORMAL LOW (ref >60)
Glucose, Bld: 104 mg/dL — ABNORMAL HIGH (ref 70–99)
Glucose, Bld: 95 mg/dL (ref 70–99)
Potassium: 3.8 mmol/L (ref 3.5–5.1)
Potassium: 3.9 mmol/L (ref 3.5–5.1)
Sodium: 139 mmol/L (ref 135–145)
Sodium: 141 mmol/L (ref 135–145)
Total Bilirubin: 1.7 mg/dL — ABNORMAL HIGH (ref 0.0–1.2)
Total Bilirubin: 1.4 mg/dL — ABNORMAL HIGH (ref 0.0–1.2)
Total Protein: 7.5 g/dL (ref 6.5–8.1)
Total Protein: 7 g/dL (ref 6.5–8.1)

## 2023-11-09 LAB — CBC
HCT: 41.1 % (ref 36.0–46.0)
Hemoglobin: 13.6 g/dL (ref 12.0–15.0)
MCH: 31.5 pg (ref 26.0–34.0)
MCHC: 33.1 g/dL (ref 30.0–36.0)
MCV: 95.1 fL (ref 80.0–100.0)
Platelets: 235 K/uL (ref 150–400)
RBC: 4.32 MIL/uL (ref 3.87–5.11)
RDW: 12.5 % (ref 11.5–15.5)
WBC: 8.2 K/uL (ref 4.0–10.5)
nRBC: 0 % (ref 0.0–0.2)

## 2023-11-09 LAB — CBG MONITORING, ED: Glucose-Capillary: 101 mg/dL — ABNORMAL HIGH (ref 70–99)

## 2023-11-09 LAB — BUN: BUN: 30 mg/dL — ABNORMAL HIGH (ref 8–23)

## 2023-11-09 LAB — LIPASE, BLOOD: Lipase: 26 U/L (ref 11–51)

## 2023-11-09 LAB — CREATININE, SERUM
Creatinine, Ser: 2.03 mg/dL — ABNORMAL HIGH (ref 0.44–1.00)
GFR, Estimated: 25 mL/min — ABNORMAL LOW (ref >60)

## 2023-11-09 MED ORDER — AMLODIPINE-OLMESARTAN 10-40 MG PO TABS
1.0000 | ORAL_TABLET | Freq: Every day | ORAL | Status: DC
Start: 2023-11-10 — End: 2023-11-10

## 2023-11-09 MED ORDER — ONDANSETRON HCL 4 MG/2ML IJ SOLN
4.0000 mg | Freq: Four times a day (QID) | INTRAMUSCULAR | Status: DC | PRN
  Administered 2023-11-10: 4 mg via INTRAVENOUS
  Filled 2023-11-09: qty 2

## 2023-11-09 MED ORDER — PANTOPRAZOLE SODIUM 40 MG PO TBEC
40.0000 mg | DELAYED_RELEASE_TABLET | Freq: Every day | ORAL | Status: DC
  Administered 2023-11-10 – 2023-11-12 (×3): 40 mg via ORAL
  Filled 2023-11-09 (×3): qty 1

## 2023-11-09 MED ORDER — ACETAMINOPHEN 500 MG PO TABS
1000.0000 mg | ORAL_TABLET | Freq: Once | ORAL | Status: AC
  Administered 2023-11-09: 1000 mg via ORAL
  Filled 2023-11-09: qty 2

## 2023-11-09 MED ORDER — KETOROLAC TROMETHAMINE 15 MG/ML IJ SOLN
15.0000 mg | Freq: Once | INTRAMUSCULAR | Status: AC
  Administered 2023-11-09: 15 mg via INTRAVENOUS
  Filled 2023-11-09: qty 1

## 2023-11-09 MED ORDER — INSULIN ASPART 100 UNIT/ML IJ SOLN: 0.0000 [IU] | Freq: Every day | INTRAMUSCULAR | Status: DC

## 2023-11-09 MED ORDER — INSULIN ASPART 100 UNIT/ML IJ SOLN
0.0000 [IU] | Freq: Three times a day (TID) | INTRAMUSCULAR | Status: DC
  Filled 2023-11-09: qty 1

## 2023-11-09 MED ORDER — ROSUVASTATIN CALCIUM 5 MG PO TABS
5.0000 mg | ORAL_TABLET | ORAL | Status: DC
  Administered 2023-11-12: 5 mg via ORAL
  Filled 2023-11-09: qty 1

## 2023-11-09 MED ORDER — ONDANSETRON 4 MG PO TBDP
4.0000 mg | ORAL_TABLET | Freq: Once | ORAL | Status: AC | PRN
  Administered 2023-11-09: 4 mg via ORAL
  Filled 2023-11-09: qty 1

## 2023-11-09 MED ORDER — LIDOCAINE 5 % EX PTCH
1.0000 | MEDICATED_PATCH | CUTANEOUS | Status: DC
  Administered 2023-11-09 – 2023-11-11 (×3): 1 via TRANSDERMAL
  Filled 2023-11-09 (×3): qty 1

## 2023-11-09 MED ORDER — CEFTRIAXONE SODIUM 1 G IJ SOLR: 1.0000 g | INTRAMUSCULAR | Status: DC

## 2023-11-09 MED ORDER — HYDRALAZINE HCL 20 MG/ML IJ SOLN: 10.0000 mg | Freq: Three times a day (TID) | INTRAMUSCULAR | Status: DC | PRN

## 2023-11-09 MED ORDER — SODIUM CHLORIDE 0.9 % IV SOLN
2.0000 g | Freq: Once | INTRAVENOUS | Status: AC
  Administered 2023-11-09: 2 g via INTRAVENOUS
  Filled 2023-11-09: qty 20

## 2023-11-09 MED ORDER — LINACLOTIDE 72 MCG PO CAPS
72.0000 ug | ORAL_CAPSULE | Freq: Every day | ORAL | Status: DC
Start: 2023-11-10 — End: 2023-11-12
  Administered 2023-11-10 – 2023-11-12 (×3): 72 ug via ORAL
  Filled 2023-11-09 (×3): qty 1

## 2023-11-09 MED ORDER — SODIUM CHLORIDE 0.9 % IV BOLUS
500.0000 mL | Freq: Once | INTRAVENOUS | Status: AC
  Administered 2023-11-09: 500 mL via INTRAVENOUS

## 2023-11-09 MED ORDER — ENOXAPARIN SODIUM 30 MG/0.3ML IJ SOSY
30.0000 mg | PREFILLED_SYRINGE | INTRAMUSCULAR | Status: DC
  Administered 2023-11-10 – 2023-11-11 (×2): 30 mg via SUBCUTANEOUS
  Filled 2023-11-09 (×3): qty 0.3

## 2023-11-09 MED ORDER — ONDANSETRON HCL 4 MG PO TABS: 4.0000 mg | ORAL_TABLET | Freq: Four times a day (QID) | ORAL | Status: DC | PRN

## 2023-11-09 MED ORDER — LACTATED RINGERS IV SOLN: INTRAVENOUS | Status: AC

## 2023-11-09 MED ORDER — TRAZODONE HCL 50 MG PO TABS: 25.0000 mg | ORAL_TABLET | Freq: Every evening | ORAL | Status: DC | PRN

## 2023-11-09 NOTE — ED Provider Notes (Signed)
 [facility] Provider Note    Event Date/Time   First MD Initiated Contact with Patient 11/09/23 2015     (approximate)   History   Abdominal Pain and Emesis  Pt arrives POV, ambulatory to triage, gait steady, no acute distress noted, c/o left lower abd/flank pain since 0200 this morning w/ n/v. Denies CP/sob.    HPI Patricia Horne is a 73 y.o. female PMH T2DM, hypertension, hyperlipidemia, GERD presents for evaluation of abdominal pain, flank pain - Present since about 2 AM, was 10/10, primarily left flank, does radiate somewhat to left lower quadrant.  Vomited about 3 times.  No upper abdominal pain.  No obvious urinary symptoms though has had reduced urinary output today which she attributes to decreased p.o. intake. - No history of urolithiasis - No preceding trauma - [place] and did eat several foods which she does not normally       Physical Exam   Triage Vital Signs: ED Triage Vitals  Encounter Vitals Group     BP 11/09/23 1929 128/71     Girls Systolic BP Percentile --      Girls Diastolic BP Percentile --      Boys Systolic BP Percentile --      Boys Diastolic BP Percentile --      Pulse Rate 11/09/23 1929 (!) 102     Resp 11/09/23 1929 18     Temp 11/09/23 1929 99.1 F (37.3 C)     Temp Source 11/09/23 1929 Oral     SpO2 11/09/23 1929 98 %     Weight --      Height --      Head Circumference --      Peak Flow --      Pain Score 11/09/23 1930 7     Pain Loc --      Pain Education --      Exclude from Growth Chart --     Most recent vital signs: Vitals:   11/09/23 2033 11/09/23 2130  BP:  (!) 145/77  Pulse:  88  Resp:  18  Temp:  99 F (37.2 C)  SpO2: 100% 100%     General: Awake, no distress.  CV:  Good peripheral perfusion. RRR, RP 2+ Resp:  Normal effort. CTAB Abd:  No distention.  Mild tenderness palpation in left lower quadrant, no tenderness elsewhere throughout abdomen,  mild-moderate left lateral abdomen and left flank tenderness.    ED Results / Procedures / Treatments   Labs (all labs ordered are listed, but only abnormal results are displayed) Labs Reviewed  COMPREHENSIVE METABOLIC PANEL WITH GFR - Abnormal; Notable for the following components:      Result Value   CO2 16 (*)    Glucose, Bld 104 (*)    Total Bilirubin 1.7 (*)    Anion gap 17 (*)    All other components within normal limits  URINALYSIS, ROUTINE W REFLEX MICROSCOPIC - Abnormal; Notable for the following components:   Color, Urine YELLOW (*)    APPearance HAZY (*)    Hgb urine dipstick MODERATE (*)    Protein, ur 100 (*)    Bacteria, UA RARE (*)    All other components within normal limits  BUN - Abnormal; Notable for the following components:   BUN 30 (*)    All other components within normal limits  CREATININE, SERUM - Abnormal; Notable for the following components:   Creatinine, Ser  2.03 (*)    GFR, Estimated 25 (*)    All other components within normal limits  COMPREHENSIVE METABOLIC PANEL WITH GFR - Abnormal; Notable for the following components:   BUN 29 (*)    Creatinine, Ser 2.02 (*)    Albumin 3.2 (*)    Total Bilirubin 1.4 (*)    GFR, Estimated 26 (*)    All other components within normal limits  CBG MONITORING, ED - Abnormal; Notable for the following components:   Glucose-Capillary 101 (*)    All other components within normal limits  LIPASE, BLOOD  CBC     EKG  N/a   RADIOLOGY Radiology interpreted by myself and radiology report reviewed.  Nonspecific findings in the iliopsoas region and possible pyelonephritis.  No urolithiasis appreciated.  PROCEDURES:  Critical Care performed: No  Procedures   MEDICATIONS ORDERED IN ED: Medications  lidocaine  (LIDODERM ) 5 % 1 patch (1 patch Transdermal Patch Applied 11/09/23 2113)  cefTRIAXone (ROCEPHIN) 2 g in sodium chloride  0.9 % 100 mL IVPB (has no administration in time range)  ondansetron   (ZOFRAN -ODT) disintegrating tablet 4 mg (4 mg Oral Given 11/09/23 1940)  sodium chloride  0.9 % bolus 500 mL (500 mLs Intravenous New Bag/Given 11/09/23 2105)  acetaminophen  (TYLENOL ) tablet 1,000 mg (1,000 mg Oral Given 11/09/23 2106)  ketorolac  (TORADOL ) 15 MG/ML injection 15 mg (15 mg Intravenous Given 11/09/23 2108)     IMPRESSION / MDM / ASSESSMENT AND PLAN / ED COURSE  I reviewed the triage vital signs and the nursing notes.                              DDX/MDM/AP: Differential diagnosis includes, but is not limited to, urolithiasis, pyelonephritis, diverticulitis, doubt atypical appendicitis, consider MSK strain, doubt bony lesion.  Consider some component of dehydration contributing.  Consider gastroenteritis.  Plan: - Labs - Tylenol , Toradol , Zofran  - small bolus IV fluid CT abdomen pelvis - Reassess  Patient's presentation is most consistent with acute presentation with potential threat to life or bodily function.  The patient is on the cardiac monitor to evaluate for evidence of arrhythmia and/or significant heart rate changes.  ED course below.  Workup with AKI, creatinine 2.0, baseline 1.0.  Urinalysis with some pyuria and hematuria notable elevation--equivocal.  CT read with nonspecific infiltration in left iliopsoas region, possible fat necrosis or inflammatory reaction, radiology also specifically notes this could indicate pyelonephritis.  Given symptoms and equivocal urine, treating empirically with ceftriaxone.  Given AKI, I do believe admission is appropriate, will also allow further monitoring to ensure no interval change in symptoms given somewhat nonspecific findings on CT.  Admitted to hospitalist service.  Clinical Course as of 11/09/23 2341  Fri Nov 09, 2023  2023 CBC reviewed, unremarkable [MM]  2227 CTAP: IMPRESSION: 1. Vague infiltration in the left iliopsoas region of nonspecific etiology, possibly fat necrosis or other inflammatory reaction. Adjacent  descending colon and iliac vessels are intact. Left ureter is decompressed. This could possibly indicate pyelonephritis. 2. No evidence of bowel obstruction or inflammation. 3. No renal or ureteral stone or obstruction. 4. Aortic atherosclerosis.   [MM]  2255 Patient reevaluated, findings discussed, amenable to admission  Has only had diarrhea with amoxicillin  in the past, no true allergy --will treat with ceftriaxone  Hospitalist consult order placed [MM]    Clinical Course User Index [MM] Clarine Ozell LABOR, MD     FINAL CLINICAL IMPRESSION(S) / ED DIAGNOSES  Final diagnoses:  Pyelonephritis  AKI (acute kidney injury)  Left flank pain     Rx / DC Orders   ED Discharge Orders     None        Note:  This document was prepared using Dragon voice recognition software and may include unintentional dictation errors.   Clarine Ozell LABOR, MD 11/09/23 [phone]

## 2023-11-09 NOTE — H&P (Signed)
 History and Physical    Patient: Patricia Horne FMW:[id number] DOB: [birth year] DOA: 11/09/2023 DOS: the patient was seen and examined on 11/09/2023 PCP: Avelina Greig BRAVO, MD  Patient coming from: Home  Chief Complaint:  Chief Complaint  Patient presents with   Abdominal Pain   Emesis   HPI: Patricia Horne is a 73 y.o. female with medical history significant of diabetes, diabetic retinopathy, diabetic nephropathy, essential hypertension, hyperlipidemia, macular degeneration, glaucoma, diastolic dysfunction who presented to the ER with left lower abdominal and flank pain that started last night.  Pain was persistent.  No vomiting but some nausea.  Denied any fever or chills.  Patient felt dizzy and weak.  Patient was seen and evaluated in the ER.  CT abdomen pelvis showed findings consistent with acute pyelonephritis most likely.  Patient has been admitted for further evaluation and treatment.  Review of Systems: As mentioned in the history of present illness. All other systems reviewed and are negative. Past Medical History:  Diagnosis Date   Abdominal pain, left lower quadrant    Acute upper respiratory infections of unspecified site    Allergic genetic state    Arthritis    in arms   Diabetic retinopathy (HCC)    Glaucoma    Grade I diastolic dysfunction    Lipoma of unspecified site    Macular degeneration    Neuropathy    Obesity    Other and unspecified noninfectious gastroenteritis and colitis(558.9)    Other screening mammogram    Routine general medical examination at a health care facility    Screening for lipoid disorders    Special screening for osteoporosis    Tibialis tendinitis    Tubular adenoma of colon 03/31/2014   Type II or unspecified type diabetes mellitus without mention of complication, not stated as uncontrolled    Undiagnosed cardiac murmurs    Unspecified essential hypertension    Past Surgical History:  Procedure Laterality Date   CATARACT  EXTRACTION W/PHACO Left 09/29/2019   Procedure: CATARACT EXTRACTION PHACO AND INTRAOCULAR LENS PLACEMENT (IOC) LEFT DIABETIC 3.42 00:32.9;  Surgeon: Myrna Adine Anes, MD;  Location: [facility];  Service: Ophthalmology;  Laterality: Left;   CATARACT EXTRACTION W/PHACO Right 07/03/2022   Procedure: CATARACT EXTRACTION PHACO AND INTRAOCULAR LENS PLACEMENT (IOC) RIGHT INTRAVITREAL KENALOG  INJECTION DIABETIC  5.53  00:46.1;  Surgeon: Myrna Adine Anes, MD;  Location: [facility];  Service: Ophthalmology;  Laterality: Right;  Diabetic   COLONOSCOPY  04/01/2004   COLONOSCOPY  03/31/2014   COLONOSCOPY WITH PROPOFOL  N/A 01/01/2018   Procedure: COLONOSCOPY WITH PROPOFOL ;  Surgeon: Gaylyn Gladis PENNER, MD;  Location: [facility] ENDOSCOPY;  Service: Endoscopy;  Laterality: N/A;   COLONOSCOPY WITH PROPOFOL  N/A 02/19/2023   Procedure: COLONOSCOPY WITH PROPOFOL ;  Surgeon: Onita Elspeth Sharper, DO;  Location: [facility] ENDOSCOPY;  Service: Gastroenterology;  Laterality: N/A;   EYE SURGERY     LIPOMA EXCISION Left 2010   arm   POLYPECTOMY  02/19/2023   Procedure: POLYPECTOMY;  Surgeon: Onita Elspeth Sharper, DO;  Location: [facility] ENDOSCOPY;  Service: Gastroenterology;;   TOE SURGERY  1990's   bunion, hammer toe repair   TOTAL ABDOMINAL HYSTERECTOMY  1983   one ovary removed, later other ovary removed   Social History:  reports that she has never smoked. She has never been exposed to tobacco smoke. She has never used smokeless tobacco. She reports that she does not drink alcohol and does not use drugs.  Allergies  Allergen Reactions  Amoxicillin  Diarrhea    Family History  Problem Relation Age of Onset   Diabetes Mother    Arthritis Father    Diabetes Brother        type 1   Ulcerative colitis Paternal Grandmother    Colon polyps Paternal Grandfather    Breast cancer Neg Hx     Prior to Admission medications   Medication Sig Start Date End Date Taking? Authorizing Provider  acetaminophen   (TYLENOL ) 500 MG tablet Take 500 mg by mouth every 6 (six) hours as needed.    [provider]  amLODipine -olmesartan  (AZOR ) 10-40 MG tablet Take 1 tablet by mouth daily. 10/04/23   Bedsole, Amy E, MD  Blood Glucose Monitoring Suppl (ONE TOUCH ULTRA 2) w/Device KIT Use to check blood sugar once daily 09/14/23   Bedsole, Amy E, MD  Continuous Glucose Sensor (FREESTYLE LIBRE 3 SENSOR) MISC Place 1 sensor on the skin every 14 days. Use to check glucose continuously 06/28/23   Bedsole, Amy E, MD  glucose blood (ONETOUCH ULTRA TEST) test strip Use to check blood sugar once daily 09/14/23   Avelina, Amy E, MD  Lancets (ONETOUCH ULTRASOFT) lancets Use to check blood sugar once daily 09/14/23   Bedsole, Amy E, MD  Lancets Misc. (ACCU-CHEK SOFTCLIX LANCET DEV) KIT Use to check blood sugar once dialy 09/12/23   Bedsole, Amy E, MD  linaclotide  (LINZESS ) 72 MCG capsule Take 1 capsule (72 mcg total) by mouth daily. 11/28/22     meloxicam  (MOBIC ) 15 MG tablet Take 1 tablet (15 mg total) by mouth daily. 08/17/23   Bedsole, Amy E, MD  omeprazole  (PRILOSEC) 20 MG capsule Take 1 capsule (20 mg total) by mouth daily. 04/16/23   Bedsole, Amy E, MD  ondansetron  (ZOFRAN ) 4 MG tablet Take 1 tablet (4 mg total) by mouth every 8 (eight) hours as needed for nausea or vomiting. 06/08/22   Avelina Greig BRAVO, MD  rosuvastatin  (CRESTOR ) 5 MG tablet Take 1 tablet (5 mg total) by mouth every Monday, Wednesday, and Friday. 08/17/23   Bedsole, Amy E, MD  Semaglutide , 1 MG/DOSE, 4 MG/3ML SOPN Inject 1 mg as directed once a week. 11/07/22   Bedsole, Amy E, MD  traZODone  (DESYREL ) 50 MG tablet Take 0.5-1 tablets (25-50 mg total) by mouth at bedtime as needed for sleep. 08/17/23   Avelina Greig BRAVO, MD    Physical Exam: Vitals:   11/09/23 1929 11/09/23 2033 11/09/23 2130  BP: 128/71  (!) 145/77  Pulse: (!) 102  88  Resp: 18  18  Temp: 99.1 F (37.3 C)  99 F (37.2 C)  TempSrc: Oral  Oral  SpO2: 98% 100% 100%   Constitutional: Acutely  ill looking no distress NAD, calm, comfortable Eyes: PERRL, lids and conjunctivae normal ENMT: Mucous membranes are dry. Posterior pharynx clear of any exudate or lesions.Normal dentition.  Neck: normal, supple, no masses, no thyromegaly Respiratory: clear to auscultation bilaterally, no wheezing, no crackles. Normal respiratory effort. No accessory muscle use.  Cardiovascular: Sinus tachycardia, no murmurs / rubs / gallops. No extremity edema. 2+ pedal pulses. No carotid bruits.  Abdomen: no tenderness, no masses palpated. No hepatosplenomegaly. Bowel sounds positive.  Musculoskeletal: Good range of motion, no joint swelling or tenderness, Skin: no rashes, lesions, ulcers. No induration Neurologic: CN 2-12 grossly intact. Sensation intact, DTR normal. Strength 5/5 in all 4.  Psychiatric: Normal judgment and insight. Alert and oriented x 3. Normal mood  Data Reviewed:  Temperature 99.1, blood pressure 145/77, pulse 102,  white count 8.2, BUN 29 creatinine 2.02.  Albumin 3.2 total bili 1.4.  Urinalysis shows WBC 11-20 with rare bacteria.  Hazy urine.  CT abdomen pelvis showed back infiltration in the left iliopsoas region of nonspecific etiology.  Possibly fat necrosis or other inflammatory reaction.  There is adjacent descending colon and iliac vessels intact left ureter decompressed indicating pyelonephritis.  No evidence of bowel obstruction or inflammation  Assessment and Plan:  #1 suspected pyelonephritis: Patient presenting with abdominal pain and flank pain as well as low-grade temperature.  With the CT findings patient will be admitted and initiated on IV Rocephin.  Obtain urine culture and blood cultures and follow results.  Continue other supportive care.  #2 diabetes: Type II.  Insulin -dependent.  Initiate sliding scale insulin   #3 essential hypertension: Continue with blood pressure monitoring and control.  #4 hyperlipidemia: Continue with statin.  #5 diabetic neuropathy: Continue  home regimen  #6 GERD: Continue PPIs.  #7 AKI: Continue hydration and monitor renal function.    Advance Care Planning:   Code Status: Full Code   Consults: None  Family Communication: No family at bedside  Severity of Illness: The appropriate patient status for this patient is INPATIENT. Inpatient status is judged to be reasonable and necessary in order to provide the required intensity of service to ensure the patient's safety. The patient's presenting symptoms, physical exam findings, and initial radiographic and laboratory data in the context of their chronic comorbidities is felt to place them at high risk for further clinical deterioration. Furthermore, it is not anticipated that the patient will be medically stable for discharge from the hospital within 2 midnights of admission.   * I certify that at the point of admission it is my clinical judgment that the patient will require inpatient hospital care spanning beyond 2 midnights from the point of admission due to high intensity of service, high risk for further deterioration and high frequency of surveillance required.*  AuthorBETHA SIM KNOLL, MD 11/09/2023 11:51 PM  For on call review [email or web address].

## 2023-11-09 NOTE — ED Triage Notes (Signed)
 Pt arrives POV, ambulatory to triage, gait steady, no acute distress noted, c/o left lower abd/flank pain since 0200 this morning w/ n/v. Denies CP/sob.

## 2023-11-10 DIAGNOSIS — N10 Acute tubulo-interstitial nephritis: Secondary | ICD-10-CM | POA: Diagnosis not present

## 2023-11-10 LAB — COMPREHENSIVE METABOLIC PANEL WITH GFR
ALT: 10 U/L (ref 0–44)
AST: 19 U/L (ref 15–41)
Albumin: 3.1 g/dL — ABNORMAL LOW (ref 3.5–5.0)
Alkaline Phosphatase: 47 U/L (ref 38–126)
Anion gap: 12 (ref 5–15)
BUN: 32 mg/dL — ABNORMAL HIGH (ref 8–23)
CO2: 23 mmol/L (ref 22–32)
Calcium: 8.6 mg/dL — ABNORMAL LOW (ref 8.9–10.3)
Chloride: 105 mmol/L (ref 98–111)
Creatinine, Ser: 1.8 mg/dL — ABNORMAL HIGH (ref 0.44–1.00)
GFR, Estimated: 29 mL/min — ABNORMAL LOW (ref >60)
Glucose, Bld: 109 mg/dL — ABNORMAL HIGH (ref 70–99)
Potassium: 3.4 mmol/L — ABNORMAL LOW (ref 3.5–5.1)
Sodium: 140 mmol/L (ref 135–145)
Total Bilirubin: 1 mg/dL (ref 0.0–1.2)
Total Protein: 6.5 g/dL (ref 6.5–8.1)

## 2023-11-10 LAB — GLUCOSE, CAPILLARY
Glucose-Capillary: 80 mg/dL (ref 70–99)
Glucose-Capillary: 106 mg/dL — ABNORMAL HIGH (ref 70–99)
Glucose-Capillary: 90 mg/dL (ref 70–99)
Glucose-Capillary: 86 mg/dL (ref 70–99)
Glucose-Capillary: 96 mg/dL (ref 70–99)

## 2023-11-10 LAB — CBC
HCT: 36 % (ref 36.0–46.0)
Hemoglobin: 12 g/dL (ref 12.0–15.0)
MCH: 31.7 pg (ref 26.0–34.0)
MCHC: 33.3 g/dL (ref 30.0–36.0)
MCV: 95 fL (ref 80.0–100.0)
Platelets: 209 K/uL (ref 150–400)
RBC: 3.79 MIL/uL — ABNORMAL LOW (ref 3.87–5.11)
RDW: 12.7 % (ref 11.5–15.5)
WBC: 9.5 K/uL (ref 4.0–10.5)
nRBC: 0 % (ref 0.0–0.2)

## 2023-11-10 MED ORDER — AMLODIPINE BESYLATE 10 MG PO TABS
10.0000 mg | ORAL_TABLET | Freq: Every day | ORAL | Status: DC
  Administered 2023-11-10 – 2023-11-12 (×3): 10 mg via ORAL
  Filled 2023-11-10 (×3): qty 1

## 2023-11-10 MED ORDER — POTASSIUM CHLORIDE CRYS ER 10 MEQ PO TBCR
10.0000 meq | EXTENDED_RELEASE_TABLET | Freq: Two times a day (BID) | ORAL | Status: AC
  Administered 2023-11-10 (×2): 10 meq via ORAL
  Filled 2023-11-10 (×2): qty 1

## 2023-11-10 MED ORDER — SODIUM CHLORIDE 0.9 % IV SOLN
1.0000 g | INTRAVENOUS | Status: DC
  Administered 2023-11-10 – 2023-11-11 (×2): 1 g via INTRAVENOUS
  Filled 2023-11-10 (×2): qty 10

## 2023-11-10 MED ORDER — IRBESARTAN 150 MG PO TABS
300.0000 mg | ORAL_TABLET | Freq: Every day | ORAL | Status: DC
  Administered 2023-11-10: 300 mg via ORAL
  Filled 2023-11-10: qty 2

## 2023-11-10 MED ORDER — ACETAMINOPHEN 325 MG PO TABS
650.0000 mg | ORAL_TABLET | Freq: Four times a day (QID) | ORAL | Status: DC | PRN
  Administered 2023-11-11 (×2): 650 mg via ORAL
  Filled 2023-11-10 (×2): qty 2

## 2023-11-10 MED ORDER — HYDRALAZINE HCL 20 MG/ML IJ SOLN: 5.0000 mg | Freq: Four times a day (QID) | INTRAMUSCULAR | Status: DC | PRN

## 2023-11-10 NOTE — Progress Notes (Signed)
 Anticoagulation monitoring(Lovenox):  73 yo female ordered Lovenox 40 mg Q24h    Filed Weights   11/10/23 0044  Weight: 69.7 kg (153 lb 10.6 oz)   BMI 29.6   Lab Results  Component Value Date   CREATININE 2.02 (H) 11/09/2023   CREATININE QUANTITY NOT SUFFICIENT, UNABLE TO PERFORM TEST 11/09/2023   CREATININE 2.03 (H) 11/09/2023   Estimated Creatinine Clearance: 21.8 mL/min (A) (by C-G formula based on SCr of 2.02 mg/dL (H)). Hemoglobin & Hematocrit     Component Value Date/Time   HGB 13.6 11/09/2023 1933   HGB 14.8 09/30/2012 1353   HCT 41.1 11/09/2023 1933   HCT 41.9 09/30/2012 1353     Per Protocol for Patient with estCrcl < 30 ml/min and BMI < 30, will transition to Lovenox 30 mg Q24h.

## 2023-11-10 NOTE — Assessment & Plan Note (Addendum)
 Home equivalent antihypertensive medication resumed on admission Amlodipine  10 mg daily resumed ARB not resumed due to AKI Hydralazine 5 mg IV every 6 hours as needed for SBP > 170, 5 days ordered Amlodipine /olmesartan  10-40 mg daily can be resumed on discharge

## 2023-11-10 NOTE — ED Notes (Signed)
 Gave pt some graham crackers and a ginger ale.

## 2023-11-10 NOTE — Progress Notes (Addendum)
 PROGRESS NOTE    Patricia Horne  FMW:[id number] DOB: [birth year] DOA: 11/09/2023 PCP: Avelina Greig BRAVO, MD   Patricia Horne is a 74 year old female with history of non-insulin -dependent diabetes mellitus, diabetic retinopathy, diabetic nephropathy, hypertension, hyperlipidemia, glaucoma, macular degeneration, heart failure preserved ejection fraction, who presents emergency department for chief concerns of left lower abdominal pain and flank pain on 11/09/2023.  Social history: She lives at home on her own.  She denies tobacco, EtOH, recreational drug use.  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  Patient was admitted for possible pyelonephritis, UTI, acute kidney injury.  In the ED, patient was given ceftriaxone 2 g IV one-time dose.  11/10/2023: Day 2 of antibiotic treatment.  Serum creatinine is improving as appropriate.  Baseline is no CKD.  On admission serum creatinine was 2.03 and currently improved to 1.80.  Assessment & Plan:   Principal Problem:   Acute pyelonephritis Active Problems:   AKI (acute kidney injury)   Type 2 diabetes mellitus with neurological complications (HCC)   Hypertension associated with diabetes (HCC)   Neuropathy due to type 2 diabetes mellitus (HCC)   Post traumatic stress disorder   Gastroesophageal reflux disease   Chronic insomnia   Assessment and Plan:  * Acute pyelonephritis Status post ceftriaxone 2 g IV one-time dose in the ED, continue ceftriaxone IV 1 g IV to complete a 5-day course  AKI (acute kidney injury) Suspect secondary to pyelonephritis Home ARB not resumed on admission Avoid scheduled nephrotoxic agents Status post sodium chloride  500 mL liter bolus Continue with LR infusion 75 mL/h, 1 day ordered Recheck BMP in the a.m.  Hypertension associated with diabetes (HCC) Home equivalent antihypertensive medication resumed on admission Amlodipine  10 mg daily resumed ARB  not resumed due to AKI Hydralazine 5 mg IV every 6 hours as needed for SBP > 170, 5 days ordered  Type 2 diabetes mellitus with neurological complications (HCC) Home subcutaneous antiglycemic agents will not be resumed in the hospital Insulin  SSI with at bedtime coverage ordered Goal inpatient blood glucose levels 140-180  Gastroesophageal reflux disease Home PPI  Chronic insomnia Trazodone  25 mg nightly as needed for sleep  DVT prophylaxis: Enoxaparin Code Status: Full code Family Communication: No Disposition Plan: Pending clinical course Level of care: Med-Surg  Consultants:  None at this time  Procedures:  None at this time  Antimicrobials: Ceftriaxone  Subjective:  Patient able to tell me her first and last name, age, location, current calendar year.  She reports that her blood pressure is pretty well-controlled.  She reports she cannot eat the food in the hospital because of the low salt content.  She is requesting a regular salted meal.  Objective: Vitals:   11/10/23 0044 11/10/23 0114 11/10/23 0459 11/10/23 0823  BP: (!) 155/79  133/74 129/69  Pulse: 86  81 83  Resp: 17  18 17   Temp: 98.4 F (36.9 C)  98.6 F (37 C) 98.3 F (36.8 C)  TempSrc:   Oral   SpO2: 100%  98% 99%  Weight: 69.7 kg     Height:  5' 1 (1.549 m)      Intake/Output Summary (Last 24 hours) at 11/10/2023 1406 Last data filed at 11/10/2023 1048 Gross per 24 hour  Intake 564.73 ml  Output --  Net 564.73 ml   Filed Weights   11/10/23 0044  Weight: 69.7 kg   Examination:  General exam: Appears calm and comfortable  Respiratory  system: Clear to auscultation. Respiratory effort normal. Cardiovascular system: S1 & S2 heard, RRR. No JVD, murmurs, rubs, gallops or clicks. No pedal edema. Gastrointestinal system: Abdomen is nondistended, soft and nontender. No organomegaly or masses felt. Normal bowel sounds heard. Central nervous system: Alert and oriented. No focal neurological  deficits. Extremities: Symmetric 5 x 5 power. Skin: No rashes, lesions or ulcers Psychiatry: Judgement and insight appear normal. Mood & affect appropriate.   Data Reviewed: I have personally reviewed following labs and imaging studies  CBC: Recent Labs  Lab 11/09/23 1933 11/10/23 0237  WBC 8.2 9.5  HGB 13.6 12.0  HCT 41.1 36.0  MCV 95.1 95.0  PLT 235 209   Basic Metabolic Panel: Recent Labs  Lab 11/09/23 1933 11/09/23 2140 11/10/23 0237  NA 139 141 140  K 3.8 3.9 3.4*  CL 106 105 105  CO2 16* 23 23  GLUCOSE 104* 95 109*  BUN 30*  QUANTITY NOT SUFFICIENT, UNABLE TO PERFORM TEST 29* 32*  CREATININE 2.03*  QUANTITY NOT SUFFICIENT, UNABLE TO PERFORM TEST 2.02* 1.80*  CALCIUM  9.1 9.1 8.6*   GFR: Estimated Creatinine Clearance: 24.9 mL/min (A) (by C-G formula based on SCr of 1.8 mg/dL (H)).  Liver Function Tests: Recent Labs  Lab 11/09/23 1933 11/09/23 2140 11/10/23 0237  AST 24 21 19   ALT 10 11 10   ALKPHOS 59 51 47  BILITOT 1.7* 1.4* 1.0  PROT 7.5 7.0 6.5  ALBUMIN 3.6 3.2* 3.1*   Recent Labs  Lab 11/09/23 1933  LIPASE 26   CBG: Recent Labs  Lab 11/09/23 1930 11/10/23 0103 11/10/23 0809 11/10/23 1233  GLUCAP 101* 80 106* 90   No results found for this or any previous visit (from the past 240 hours).   Radiology Studies: CT ABDOMEN PELVIS WO CONTRAST Result Date: 11/09/2023 CLINICAL DATA:  Left flank pain.  Left lower quadrant pain. EXAM: CT ABDOMEN AND PELVIS WITHOUT CONTRAST TECHNIQUE: Multidetector CT imaging of the abdomen and pelvis was performed following the standard protocol without IV contrast. RADIATION DOSE REDUCTION: This exam was performed according to the departmental dose-optimization program which includes automated exposure control, adjustment of the mA and/or kV according to patient size and/or use of iterative reconstruction technique. COMPARISON:  11/06/2014 FINDINGS: Lower chest: Lung bases are clear. Hepatobiliary: Focal  circumscribed lesion in the right lobe of liver measuring 2.4 cm diameter. No change since prior study. This is likely a benign cyst. No imaging follow-up is indicated. Pancreas: Unremarkable. No pancreatic ductal dilatation or surrounding inflammatory changes. Spleen: Normal in size without focal abnormality. Adrenals/Urinary Tract: No adrenal gland nodules. Kidneys are symmetrical. No hydronephrosis or hydroureter. No renal, ureteral, or bladder stones. Bladder is decompressed. Stomach/Bowel: Stomach, small bowel, and colon are not abnormally distended. No wall thickening or inflammatory stranding. Appendix is normal. Vascular/Lymphatic: Aortic atherosclerosis. No enlarged abdominal or pelvic lymph nodes. Reproductive: Prostate is unremarkable. Other: No free air or free fluid in the abdomen. There is mild fatty infiltration in the left iliopsoas fat and left pericolic gutter. Etiology is not identified. This is possibly fat necrosis. The adjacent descending colon and iliac vessels appear intact. The left ureter is decompressed. Possible pyelonephritis? Scarring along the anterior abdominal wall consistent with postoperative change. Musculoskeletal: Degenerative changes in the spine. No acute bony abnormalities. IMPRESSION: 1. Vague infiltration in the left iliopsoas region of nonspecific etiology, possibly fat necrosis or other inflammatory reaction. Adjacent descending colon and iliac vessels are intact. Left ureter is decompressed. This could possibly indicate pyelonephritis. 2.  No evidence of bowel obstruction or inflammation. 3. No renal or ureteral stone or obstruction. 4. Aortic atherosclerosis. Electronically Signed   By: Elsie Gravely M.D.   On: 11/09/2023 22:13   Scheduled Meds:  amLODipine   10 mg Oral Daily   enoxaparin (LOVENOX) injection  30 mg Subcutaneous Q24H   insulin  aspart  0-5 Units Subcutaneous QHS   insulin  aspart  0-9 Units Subcutaneous TID WC   lidocaine   1 patch Transdermal Q24H    linaclotide   72 mcg Oral Daily   pantoprazole   40 mg Oral Daily   potassium chloride  10 mEq Oral BID   [START ON 11/12/2023] rosuvastatin   5 mg Oral Q M,W,F   Continuous Infusions:  [START ON 11/11/2023] cefTRIAXone (ROCEPHIN)  IV     lactated ringers  75 mL/hr at 11/10/23 0911    LOS: 1 day   Time spent: 61  Dr. Sherre [facility]  If 7PM-7AM, please contact night-coverage  11/10/2023, 2:06 PM

## 2023-11-10 NOTE — Assessment & Plan Note (Signed)
 Trazodone  25 mg nightly as needed for sleep

## 2023-11-10 NOTE — Assessment & Plan Note (Addendum)
 Suspect secondary to pyelonephritis Home ARB not resumed on admission Avoid scheduled nephrotoxic agents Status post sodium chloride  500 mL liter bolus Continue with LR infusion 75 mL/h, 1 day ordered Recheck BMP in the a.m.

## 2023-11-10 NOTE — Hospital Course (Addendum)
 [name] is a 73 year old female with history of non-insulin -dependent diabetes mellitus, diabetic retinopathy, diabetic nephropathy, hypertension, hyperlipidemia, glaucoma, macular degeneration, heart failure preserved ejection fraction, who presents emergency department for chief concerns of left lower abdominal pain and flank pain on 11/09/2023.  Social history: She lives at home on her own.  She denies tobacco, EtOH, recreational drug use.  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  Patient was admitted for possible pyelonephritis, UTI, acute kidney injury.  In the ED, patient was given ceftriaxone 2 g IV one-time dose.  11/10/2023: Day 2 of antibiotic treatment.  Serum creatinine is improving as appropriate.  Baseline is no CKD.  On admission serum creatinine was 2.03 and currently improved to 1.80.  11/11/2023: Day 3 of antibiotic treatment.  Serum creatinine improving as appropriate.  Baseline serum creatinine is 1.0/eGFR 59.  Serum creatinine today is 1.4/35.  11/12/2023: Renal function, serum creatinine at 0.90/eGFR greater than 60.  Patient completed 3 days of antibiotic.  Patient discharged with recommended to decrease and/or discontinue meloxicam  use.  Follow-up with PCP as appropriate.

## 2023-11-10 NOTE — Plan of Care (Signed)

## 2023-11-10 NOTE — Assessment & Plan Note (Signed)
 Status post ceftriaxone 2 g IV one-time dose in the ED, continue ceftriaxone IV 1 g IV to complete a 5-day course 11/11/2023: Day 3 of ABX Completed 3 days of antibiotic

## 2023-11-10 NOTE — Assessment & Plan Note (Signed)
 Home PPI

## 2023-11-10 NOTE — Plan of Care (Signed)
  Problem: Education: Goal: Ability to describe self-care measures that may prevent or decrease complications (Diabetes Survival Skills Education) will improve Outcome: Progressing Goal: Individualized Educational Video(s) Outcome: Progressing   Problem: Coping: Goal: Ability to adjust to condition or change in health will improve Outcome: Progressing   Problem: Fluid Volume: Goal: Ability to maintain a balanced intake and output will improve Outcome: Progressing   Problem: Health Behavior/Discharge Planning: Goal: Ability to identify and utilize available resources and services will improve Outcome: Progressing Goal: Ability to manage health-related needs will improve Outcome: Progressing   Problem: Metabolic: Goal: Ability to maintain appropriate glucose levels will improve Outcome: Progressing   Problem: Nutritional: Goal: Maintenance of adequate nutrition will improve Outcome: Progressing Goal: Progress toward achieving an optimal weight will improve Outcome: Progressing   Problem: Skin Integrity: Goal: Risk for impaired skin integrity will decrease Outcome: Progressing   Problem: Health Behavior/Discharge Planning: Goal: Ability to manage health-related needs will improve Outcome: Progressing   Problem: Education: Goal: Knowledge of General Education information will improve Description: Including pain rating scale, medication(s)/side effects and non-pharmacologic comfort measures Outcome: Progressing   Problem: Clinical Measurements: Goal: Ability to maintain clinical measurements within normal limits will improve Outcome: Progressing Goal: Will remain free from infection Outcome: Progressing Goal: Diagnostic test results will improve Outcome: Progressing Goal: Respiratory complications will improve Outcome: Progressing Goal: Cardiovascular complication will be avoided Outcome: Progressing   Problem: Activity: Goal: Risk for activity intolerance will  decrease Outcome: Progressing   Problem: Nutrition: Goal: Adequate nutrition will be maintained Outcome: Progressing   Problem: Coping: Goal: Level of anxiety will decrease Outcome: Progressing   Problem: Pain Managment: Goal: General experience of comfort will improve and/or be controlled Outcome: Progressing   Problem: Safety: Goal: Ability to remain free from injury will improve Outcome: Progressing   Problem: Skin Integrity: Goal: Risk for impaired skin integrity will decrease Outcome: Progressing

## 2023-11-10 NOTE — Assessment & Plan Note (Signed)
 Home subcutaneous antiglycemic agents will not be resumed in the hospital Insulin  SSI with at bedtime coverage ordered Goal inpatient blood glucose levels 140-180

## 2023-11-11 DIAGNOSIS — N10 Acute tubulo-interstitial nephritis: Secondary | ICD-10-CM | POA: Diagnosis not present

## 2023-11-11 LAB — BASIC METABOLIC PANEL WITH GFR
Anion gap: 11 (ref 5–15)
BUN: 35 mg/dL — ABNORMAL HIGH (ref 8–23)
CO2: 24 mmol/L (ref 22–32)
Calcium: 8.5 mg/dL — ABNORMAL LOW (ref 8.9–10.3)
Chloride: 105 mmol/L (ref 98–111)
Creatinine, Ser: 1.54 mg/dL — ABNORMAL HIGH (ref 0.44–1.00)
GFR, Estimated: 35 mL/min — ABNORMAL LOW (ref >60)
Glucose, Bld: 99 mg/dL (ref 70–99)
Potassium: 4.1 mmol/L (ref 3.5–5.1)
Sodium: 140 mmol/L (ref 135–145)

## 2023-11-11 LAB — GLUCOSE, CAPILLARY
Glucose-Capillary: 88 mg/dL (ref 70–99)
Glucose-Capillary: 90 mg/dL (ref 70–99)
Glucose-Capillary: 111 mg/dL — ABNORMAL HIGH (ref 70–99)
Glucose-Capillary: 105 mg/dL — ABNORMAL HIGH (ref 70–99)

## 2023-11-11 MED ORDER — LACTATED RINGERS IV SOLN: INTRAVENOUS | Status: DC

## 2023-11-11 NOTE — Progress Notes (Signed)
 PROGRESS NOTE  Patricia Horne  FMW:[id number] DOB: Oct 18, 1950 DOA: 11/09/2023 PCP: Avelina Greig BRAVO, MD   [name] is a 73 year old female with history of non-insulin -dependent diabetes mellitus, diabetic retinopathy, diabetic nephropathy, hypertension, hyperlipidemia, glaucoma, macular degeneration, heart failure preserved ejection fraction, who presents emergency department for chief concerns of left lower abdominal pain and flank pain on 11/09/2023.  Social history: She lives at home on her own.  She denies tobacco, EtOH, recreational drug use.  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  Patient was admitted for possible pyelonephritis, UTI, acute kidney injury.  In the ED, patient was given ceftriaxone 2 g IV one-time dose.  11/10/2023: Day 2 of antibiotic treatment.  Serum creatinine is improving as appropriate.  Baseline is no CKD.  On admission serum creatinine was 2.03 and currently improved to 1.80.  11/11/2023: Day 3 of antibiotic treatment.  Serum creatinine improving as appropriate.  Baseline serum creatinine is 1.0/eGFR 59.  Serum creatinine today is 1.4/35.  Assessment & Plan:   Principal Problem:   Acute pyelonephritis Active Problems:   AKI (acute kidney injury)   Type 2 diabetes mellitus with neurological complications (HCC)   Hypertension associated with diabetes (HCC)   Neuropathy due to type 2 diabetes mellitus (HCC)   Post traumatic stress disorder   Gastroesophageal reflux disease   Chronic insomnia   Assessment and Plan:  * Acute pyelonephritis Status post ceftriaxone 2 g IV one-time dose in the ED, continue ceftriaxone IV 1 g IV to complete a 5-day course 11/11/2023: Day 3 of ABX  AKI (acute kidney injury) On baseline no CKD/CKD stage II; serum creatinine 1.00/eGFR 59 noted on 10/26/2023 Suspect secondary to pyelonephritis Home ARB not resumed on admission Avoid scheduled nephrotoxic  agents Status post sodium chloride  500 mL liter bolus Continue with LR infusion 75 mL/h, 1 day ordered Recheck BMP in the a.m.  11/11/2023: Patient's renal function is improving today as appropriate, serum creatinine is 1.54/eGFR 35.  LR infusion at 125 mL/h, 1 day ordered; check BMP in the a.m.  Hypertension associated with diabetes (HCC) Home equivalent antihypertensive medication resumed on admission Amlodipine  10 mg daily resumed ARB not resumed due to AKI Hydralazine 5 mg IV every 6 hours as needed for SBP > 170, 5 days ordered  Type 2 diabetes mellitus with neurological complications (HCC) Home subcutaneous antiglycemic agents will not be resumed in the hospital Insulin  SSI with at bedtime coverage ordered Goal inpatient blood glucose levels 140-180  Gastroesophageal reflux disease Home PPI  Chronic insomnia Trazodone  25 mg nightly as needed for sleep  DVT prophylaxis:  Code Status:  Family Communication: Disposition Plan:  Level of care: Med-Surg  Consultants:  Not indicated at this time  Procedures:  None at this time  Antimicrobials: Ceftriaxone  Subjective:  At bedside, patient was awake alert and oriented x 4.  She does not appear to be in acute distress.  She reports she is not drinking as much water as she should.  She did only urinate 2 times a day.  She reports that fluids were stopped at midnight because of orders.  I discussed with patient at bedside that she needs to ensure that nurses having IV fluid that has been ordered this morning.  Objective: Vitals:   11/10/23 1607 11/10/23 1945 11/11/23 0313 11/11/23 0800  BP: (!) 150/73 (!) 128/59 (!) 135/59 (!) 152/79  Pulse: 81 82 85 76  Resp:  14 16 16  Temp: 98.7 F (37.1 C) 98.6 F (37 C) 98.3 F (36.8 C) 98.1 F (36.7 C)  TempSrc: Oral   Oral  SpO2: 100% 94% 97% 97%  Weight:      Height:       Intake/Output Summary (Last 24 hours) at 11/11/2023 1131 Last data filed at 11/11/2023  0900 Gross per 24 hour  Intake 1836.77 ml  Output 1 ml  Net 1835.77 ml   Filed Weights   11/10/23 0044  Weight: 69.7 kg   Examination:  General exam: Appears calm and comfortable  Respiratory system: Clear to auscultation. Respiratory effort normal. Cardiovascular system: S1 & S2 heard, RRR. No JVD, murmurs, rubs, gallops or clicks. No pedal edema. Gastrointestinal system: Abdomen is nondistended, soft and nontender. No organomegaly or masses felt. Normal bowel sounds heard. Central nervous system: Alert and oriented. No focal neurological deficits. Extremities: Symmetric 5 x 5 power. Skin: No rashes, lesions or ulcers Psychiatry: Judgement and insight appear normal. Mood & affect appropriate.   Data Reviewed: I have personally reviewed following labs and imaging studies  CBC: Recent Labs  Lab 11/09/23 1933 11/10/23 0237  WBC 8.2 9.5  HGB 13.6 12.0  HCT 41.1 36.0  MCV 95.1 95.0  PLT 235 209   Basic Metabolic Panel: Recent Labs  Lab 11/09/23 1933 11/09/23 2140 11/10/23 0237 11/11/23 0308  NA 139 141 140 140  K 3.8 3.9 3.4* 4.1  CL 106 105 105 105  CO2 16* 23 23 24   GLUCOSE 104* 95 109* 99  BUN 30*  QUANTITY NOT SUFFICIENT, UNABLE TO PERFORM TEST 29* 32* 35*  CREATININE 2.03*  QUANTITY NOT SUFFICIENT, UNABLE TO PERFORM TEST 2.02* 1.80* 1.54*  CALCIUM  9.1 9.1 8.6* 8.5*   GFR: Estimated Creatinine Clearance: 29.1 mL/min (A) (by C-G formula based on SCr of 1.54 mg/dL (H)).  Liver Function Tests: Recent Labs  Lab 11/09/23 1933 11/09/23 2140 11/10/23 0237  AST 24 21 19   ALT 10 11 10   ALKPHOS 59 51 47  BILITOT 1.7* 1.4* 1.0  PROT 7.5 7.0 6.5  ALBUMIN 3.6 3.2* 3.1*   Recent Labs  Lab 11/09/23 1933  LIPASE 26   CBG: Recent Labs  Lab 11/10/23 0809 11/10/23 1233 11/10/23 1653 11/10/23 2118 11/11/23 0801  GLUCAP 106* 90 86 96 88   Radiology Studies: CT ABDOMEN PELVIS WO CONTRAST Result Date: 11/09/2023 CLINICAL DATA:  Left flank pain.  Left  lower quadrant pain. EXAM: CT ABDOMEN AND PELVIS WITHOUT CONTRAST TECHNIQUE: Multidetector CT imaging of the abdomen and pelvis was performed following the standard protocol without IV contrast. RADIATION DOSE REDUCTION: This exam was performed according to the departmental dose-optimization program which includes automated exposure control, adjustment of the mA and/or kV according to patient size and/or use of iterative reconstruction technique. COMPARISON:  11/06/2014 FINDINGS: Lower chest: Lung bases are clear. Hepatobiliary: Focal circumscribed lesion in the right lobe of liver measuring 2.4 cm diameter. No change since prior study. This is likely a benign cyst. No imaging follow-up is indicated. Pancreas: Unremarkable. No pancreatic ductal dilatation or surrounding inflammatory changes. Spleen: Normal in size without focal abnormality. Adrenals/Urinary Tract: No adrenal gland nodules. Kidneys are symmetrical. No hydronephrosis or hydroureter. No renal, ureteral, or bladder stones. Bladder is decompressed. Stomach/Bowel: Stomach, small bowel, and colon are not abnormally distended. No wall thickening or inflammatory stranding. Appendix is normal. Vascular/Lymphatic: Aortic atherosclerosis. No enlarged abdominal or pelvic lymph nodes. Reproductive: Prostate is unremarkable. Other: No free air or free fluid in the abdomen. There is  mild fatty infiltration in the left iliopsoas fat and left pericolic gutter. Etiology is not identified. This is possibly fat necrosis. The adjacent descending colon and iliac vessels appear intact. The left ureter is decompressed. Possible pyelonephritis? Scarring along the anterior abdominal wall consistent with postoperative change. Musculoskeletal: Degenerative changes in the spine. No acute bony abnormalities. IMPRESSION: 1. Vague infiltration in the left iliopsoas region of nonspecific etiology, possibly fat necrosis or other inflammatory reaction. Adjacent descending colon and  iliac vessels are intact. Left ureter is decompressed. This could possibly indicate pyelonephritis. 2. No evidence of bowel obstruction or inflammation. 3. No renal or ureteral stone or obstruction. 4. Aortic atherosclerosis. Electronically Signed   By: Elsie Gravely M.D.   On: 11/09/2023 22:13   Scheduled Meds:  amLODipine   10 mg Oral Daily   enoxaparin (LOVENOX) injection  30 mg Subcutaneous Q24H   insulin  aspart  0-5 Units Subcutaneous QHS   insulin  aspart  0-9 Units Subcutaneous TID WC   lidocaine   1 patch Transdermal Q24H   linaclotide   72 mcg Oral Daily   pantoprazole   40 mg Oral Daily   [START ON 11/12/2023] rosuvastatin   5 mg Oral Q M,W,F   Continuous Infusions:  cefTRIAXone (ROCEPHIN)  IV 1 g (11/10/23 2345)   lactated ringers       LOS: 2 days   Time spent: 35 minutes  Dr. Sherre [facility] If 7PM-7AM, please contact night-coverage 11/11/2023, 11:31 AM

## 2023-11-11 NOTE — Plan of Care (Signed)

## 2023-11-11 NOTE — Plan of Care (Addendum)
 AOx4. VSS. IVF and IV Abx infused before continuous fluid order discontinued at midnight. Patient did c/o dull aching pain to left lateral side. Lidocaine  patch placed and Tylenol  1x administered. Patient reported that she takes Ozempic  1 mg as a home med. She takes once a week on Sundays. Patient voiding without difficulty and had a BM during the night.  Patient ambulates independently without difficulty. Safety measures in place throughout the night.   Problem: Education: Goal: Ability to describe self-care measures that may prevent or decrease complications (Diabetes Survival Skills Education) will improve Outcome: Progressing   Problem: Coping: Goal: Ability to adjust to condition or change in health will improve Outcome: Progressing   Problem: Coping: Goal: Level of anxiety will decrease Outcome: Progressing   Problem: Pain Managment: Goal: General experience of comfort will improve and/or be controlled Outcome: Progressing   Problem: Safety: Goal: Ability to remain free from injury will improve Outcome: Progressing

## 2023-11-11 NOTE — Plan of Care (Signed)

## 2023-11-12 DIAGNOSIS — N10 Acute tubulo-interstitial nephritis: Secondary | ICD-10-CM | POA: Diagnosis not present

## 2023-11-12 LAB — CBC
HCT: 34 % — ABNORMAL LOW (ref 36.0–46.0)
Hemoglobin: 11.8 g/dL — ABNORMAL LOW (ref 12.0–15.0)
MCH: 32.1 pg (ref 26.0–34.0)
MCHC: 34.7 g/dL (ref 30.0–36.0)
MCV: 92.4 fL (ref 80.0–100.0)
Platelets: 202 K/uL (ref 150–400)
RBC: 3.68 MIL/uL — ABNORMAL LOW (ref 3.87–5.11)
RDW: 12.4 % (ref 11.5–15.5)
WBC: 5.2 K/uL (ref 4.0–10.5)
nRBC: 0 % (ref 0.0–0.2)

## 2023-11-12 LAB — BASIC METABOLIC PANEL WITH GFR
Anion gap: 14 (ref 5–15)
BUN: 22 mg/dL (ref 8–23)
CO2: 24 mmol/L (ref 22–32)
Calcium: 8.8 mg/dL — ABNORMAL LOW (ref 8.9–10.3)
Chloride: 103 mmol/L (ref 98–111)
Creatinine, Ser: 0.9 mg/dL (ref 0.44–1.00)
GFR, Estimated: >60 mL/min (ref >60)
Glucose, Bld: 90 mg/dL (ref 70–99)
Potassium: 3.9 mmol/L (ref 3.5–5.1)
Sodium: 141 mmol/L (ref 135–145)

## 2023-11-12 LAB — GLUCOSE, CAPILLARY: Glucose-Capillary: 175 mg/dL — ABNORMAL HIGH (ref 70–99)

## 2023-11-12 NOTE — Discharge Summary (Signed)
 Physician Discharge Summary   Patient: Patricia Horne MRN: [id number] DOB: 1950-06-08  Admit date:     11/09/2023  Discharge date: 11/12/23  Discharge Physician: Dr. Sherre   PCP: Avelina Greig BRAVO, MD   Recommendations at discharge:   Discontinue Mobic  until follow-up with primary care doctor.  You were admitted for acute kidney injury likely multifactorial in setting of prerenal/vomiting complicated by Mobic  use and insufficient p.o. intake of water.  Mobic  can worsen renal function.  Discharge Diagnoses: Principal Problem:   Acute pyelonephritis Active Problems:   AKI (acute kidney injury)   Type 2 diabetes mellitus with neurological complications (HCC)   Hypertension associated with diabetes (HCC)   Neuropathy due to type 2 diabetes mellitus (HCC)   Post traumatic stress disorder   Gastroesophageal reflux disease   Chronic insomnia  Resolved Problems:   AKI - resolved  Hospital Course: Patricia Horne is a 73 year old female with history of non-insulin -dependent diabetes mellitus, diabetic retinopathy, diabetic nephropathy, hypertension, hyperlipidemia, glaucoma, macular degeneration, heart failure preserved ejection fraction, who presents emergency department for chief concerns of left lower abdominal pain and flank pain on 11/09/2023.  Social history: She lives at home on her own.  She denies tobacco, EtOH, recreational drug use.  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  Patient was admitted for possible pyelonephritis, UTI, acute kidney injury.  In the ED, patient was given ceftriaxone 2 g IV one-time dose.  11/10/2023: Day 2 of antibiotic treatment.  Serum creatinine is improving as appropriate.  Baseline is no CKD.  On admission serum creatinine was 2.03 and currently improved to 1.80.  11/11/2023: Day 3 of antibiotic treatment.  Serum creatinine improving as appropriate.  Baseline serum creatinine is 1.0/eGFR 59.   Serum creatinine today is 1.4/35.  11/12/2023: Renal function, serum creatinine at 0.90/eGFR greater than 60.  Patient completed 3 days of antibiotic.  Patient discharged with recommended to decrease and/or discontinue meloxicam  use.  Follow-up with PCP as appropriate.  Assessment and Plan:  * Acute pyelonephritis Status post ceftriaxone 2 g IV one-time dose in the ED, continue ceftriaxone IV 1 g IV to complete a 5-day course 11/11/2023: Day 3 of ABX Completed 3 days of antibiotic  AKI (acute kidney injury) On baseline no CKD/CKD stage II; serum creatinine 1.00/eGFR 59 noted on 10/26/2023 Suspect secondary to pyelonephritis, complicated by nausea and vomiting with poor p.o. intake leading to prerenal, Mobic  use. Mobic  not resumed on admission Home ARB not resumed on admission Avoid scheduled nephrotoxic agents Status post sodium chloride  500 mL liter bolus Continue with LR infusion 75 mL/h, 1 day ordered Recheck BMP in the a.m.  11/11/2023: Patient's renal function is improving today as appropriate, serum creatinine is 1.54/eGFR 35.  LR infusion at 125 mL/h, 1 day ordered; check BMP in the a.m. 11/12/2023 Labs: Serum creatinine is 0.9/eGFR > 60, this is patient's baseline. Home antihypertensive medication can be resumed at this time.  Hypertension associated with diabetes (HCC) Home equivalent antihypertensive medication resumed on admission Amlodipine  10 mg daily resumed ARB not resumed due to AKI Hydralazine 5 mg IV every 6 hours as needed for SBP > 170, 5 days ordered Amlodipine /olmesartan  10-40 mg daily can be resumed on discharge  Type 2 diabetes mellitus with neurological complications (HCC) Home subcutaneous antiglycemic agents will not be resumed in the hospital Insulin  SSI with at bedtime coverage ordered Goal inpatient blood glucose levels 140-180  Gastroesophageal reflux disease Home PPI  Chronic  insomnia Trazodone  25 mg nightly as needed for sleep  Pain control -  [place]  Controlled Substance Reporting System database was reviewed. and patient was instructed, not to drive, operate heavy machinery, perform activities at heights, swimming or participation in water activities or provide baby-sitting services while on Pain, Sleep and Anxiety Medications; until their outpatient Physician has advised to do so again. Also recommended to not to take more than prescribed Pain, Sleep and Anxiety Medications.  Consultants: None Procedures performed: None indicated Disposition: Home Diet recommendation:  Cardiac and Carb modified diet DISCHARGE MEDICATION: Allergies as of 11/12/2023       Reactions   Amoxicillin  Diarrhea        Medication List     STOP taking these medications    meloxicam  15 MG tablet Commonly known as: MOBIC        TAKE these medications    Accu-Chek Softclix Lancet Dev Kit Use to check blood sugar once dialy   acetaminophen  500 MG tablet Commonly known as: TYLENOL  Take 500 mg by mouth every 6 (six) hours as needed.   amLODipine -olmesartan  10-40 MG tablet Commonly known as: AZOR  Take 1 tablet by mouth daily.   FreeStyle Libre 3 Sensor Misc Place 1 sensor on the skin every 14 days. Use to check glucose continuously   Linzess  72 MCG capsule Generic drug: linaclotide  Take 1 capsule (72 mcg total) by mouth daily.   omeprazole  20 MG capsule Commonly known as: PRILOSEC Take 1 capsule (20 mg total) by mouth daily.   ondansetron  4 MG tablet Commonly known as: Zofran  Take 1 tablet (4 mg total) by mouth every 8 (eight) hours as needed for nausea or vomiting.   ONE TOUCH ULTRA 2 w/Device Kit Use to check blood sugar once daily   OneTouch Ultra Test test strip Generic drug: glucose blood Use to check blood sugar once daily   onetouch ultrasoft lancets Use to check blood sugar once daily   rosuvastatin  5 MG tablet Commonly known as: Crestor  Take 1 tablet (5 mg total) by mouth every Monday, Wednesday, and  Friday.   Semaglutide  (1 MG/DOSE) 4 MG/3ML Sopn Inject 1 mg as directed once a week.   traZODone  50 MG tablet Commonly known as: DESYREL  Take 0.5-1 tablets (25-50 mg total) by mouth at bedtime as needed for sleep.        Discharge Exam: Filed Weights   11/10/23 0044  Weight: 69.7 kg   At bedside, patient was awake alert and oriented x 3.  She does not appear to be in acute distress.  She appeared happy stating that she is ready to go home.  She reports to me that last year she got sick at the fair as well and then again this year.  She asked the, do you think the [place] is trying to tell me something?.  She then laughs.  She states that she will likely not go to any more fairs again due to frequently getting sick there.  Physical Exam Constitutional:      Appearance: She is normal weight.  HENT:     Head: Normocephalic and atraumatic.     Mouth/Throat:     Mouth: Mucous membranes are moist.     Pharynx: Oropharynx is clear.  Eyes:     Extraocular Movements: Extraocular movements intact.     Pupils: Pupils are equal, round, and reactive to light.  Cardiovascular:     Rate and Rhythm: Normal rate and regular rhythm.     Heart sounds: Normal  heart sounds.  Pulmonary:     Effort: Pulmonary effort is normal.     Breath sounds: Normal breath sounds.  Abdominal:     General: Bowel sounds are normal.     Palpations: Abdomen is soft.  Skin:    General: Skin is warm and dry.  Neurological:     General: No focal deficit present.     Mental Status: She is alert.  Psychiatric:        Mood and Affect: Mood normal.        Behavior: Behavior normal.   Condition at discharge: good  The results of significant diagnostics from this hospitalization (including imaging, microbiology, ancillary and laboratory) are listed below for reference.   Imaging Studies: CT ABDOMEN PELVIS WO CONTRAST Result Date: 11/09/2023 CLINICAL DATA:  Left flank pain.  Left lower quadrant pain. EXAM: CT  ABDOMEN AND PELVIS WITHOUT CONTRAST TECHNIQUE: Multidetector CT imaging of the abdomen and pelvis was performed following the standard protocol without IV contrast. RADIATION DOSE REDUCTION: This exam was performed according to the departmental dose-optimization program which includes automated exposure control, adjustment of the mA and/or kV according to patient size and/or use of iterative reconstruction technique. COMPARISON:  11/06/2014 FINDINGS: Lower chest: Lung bases are clear. Hepatobiliary: Focal circumscribed lesion in the right lobe of liver measuring 2.4 cm diameter. No change since prior study. This is likely a benign cyst. No imaging follow-up is indicated. Pancreas: Unremarkable. No pancreatic ductal dilatation or surrounding inflammatory changes. Spleen: Normal in size without focal abnormality. Adrenals/Urinary Tract: No adrenal gland nodules. Kidneys are symmetrical. No hydronephrosis or hydroureter. No renal, ureteral, or bladder stones. Bladder is decompressed. Stomach/Bowel: Stomach, small bowel, and colon are not abnormally distended. No wall thickening or inflammatory stranding. Appendix is normal. Vascular/Lymphatic: Aortic atherosclerosis. No enlarged abdominal or pelvic lymph nodes. Reproductive: Prostate is unremarkable. Other: No free air or free fluid in the abdomen. There is mild fatty infiltration in the left iliopsoas fat and left pericolic gutter. Etiology is not identified. This is possibly fat necrosis. The adjacent descending colon and iliac vessels appear intact. The left ureter is decompressed. Possible pyelonephritis? Scarring along the anterior abdominal wall consistent with postoperative change. Musculoskeletal: Degenerative changes in the spine. No acute bony abnormalities. IMPRESSION: 1. Vague infiltration in the left iliopsoas region of nonspecific etiology, possibly fat necrosis or other inflammatory reaction. Adjacent descending colon and iliac vessels are intact. Left  ureter is decompressed. This could possibly indicate pyelonephritis. 2. No evidence of bowel obstruction or inflammation. 3. No renal or ureteral stone or obstruction. 4. Aortic atherosclerosis. Electronically Signed   By: Elsie Gravely M.D.   On: 11/09/2023 22:13    Microbiology: Results for orders placed or performed during the hospital encounter of 10/25/21  Resp panel by RT-PCR (RSV, Flu A&B, Covid) Anterior Nasal Swab     Status: Abnormal   Collection Time: 10/25/21  2:50 PM   Specimen: Anterior Nasal Swab  Result Value Ref Range Status   SARS Coronavirus 2 by RT PCR POSITIVE (A) NEGATIVE Final    Comment: (NOTE) SARS-CoV-2 target nucleic acids are DETECTED.  The SARS-CoV-2 RNA is generally detectable in upper respiratory specimens during the acute phase of infection. Positive results are indicative of the presence of the identified virus, but do not rule out bacterial infection or co-infection with other pathogens not detected by the test. Clinical correlation with patient history and other diagnostic information is necessary to determine patient infection status. The expected result is Negative.  Fact Sheet for Patients: BloggerCourse.com  Fact Sheet for Healthcare Providers: SeriousBroker.it  This test is not yet approved or cleared by the [place]  FDA and  has been authorized for detection and/or diagnosis of SARS-CoV-2 by FDA under an Emergency Use Authorization (EUA).  This EUA will remain in effect (meaning this test can be used) for the duration of  the COVID-19 declaration under Section 564(b)(1) of the A ct, 21 U.S.C. section 360bbb-3(b)(1), unless the authorization is terminated or revoked sooner.     Influenza A by PCR NEGATIVE NEGATIVE Final   Influenza B by PCR NEGATIVE NEGATIVE Final    Comment: (NOTE) The Xpert Xpress SARS-CoV-2/FLU/RSV plus assay is intended as an aid in the diagnosis of influenza  from Nasopharyngeal swab specimens and should not be used as a sole basis for treatment. Nasal washings and aspirates are unacceptable for Xpert Xpress SARS-CoV-2/FLU/RSV testing.  Fact Sheet for Patients: BloggerCourse.com  Fact Sheet for Healthcare Providers: SeriousBroker.it  This test is not yet approved or cleared by the [place]  FDA and has been authorized for detection and/or diagnosis of SARS-CoV-2 by FDA under an Emergency Use Authorization (EUA). This EUA will remain in effect (meaning this test can be used) for the duration of the COVID-19 declaration under Section 564(b)(1) of the Act, 21 U.S.C. section 360bbb-3(b)(1), unless the authorization is terminated or revoked.     Resp Syncytial Virus by PCR NEGATIVE NEGATIVE Final    Comment: (NOTE) Fact Sheet for Patients: BloggerCourse.com  Fact Sheet for Healthcare Providers: SeriousBroker.it  This test is not yet approved or cleared by the [place]  FDA and has been authorized for detection and/or diagnosis of SARS-CoV-2 by FDA under an Emergency Use Authorization (EUA). This EUA will remain in effect (meaning this test can be used) for the duration of the COVID-19 declaration under Section 564(b)(1) of the Act, 21 U.S.C. section 360bbb-3(b)(1), unless the authorization is terminated or revoked.  Performed at [facility], [address], [place], [place] [address]     Labs: CBC: Recent Labs  Lab 11/09/23 1933 11/10/23 0237 11/12/23 0257  WBC 8.2 9.5 5.2  HGB 13.6 12.0 11.8*  HCT 41.1 36.0 34.0*  MCV 95.1 95.0 92.4  PLT 235 209 202   Basic Metabolic Panel: Recent Labs  Lab 11/09/23 1933 11/09/23 2140 11/10/23 0237 11/11/23 0308 11/12/23 0257  NA 139 141 140 140 141  K 3.8 3.9 3.4* 4.1 3.9  CL 106 105 105 105 103  CO2 16* 23 23 24 24   GLUCOSE 104* 95 109* 99 90  BUN 30*  QUANTITY  NOT SUFFICIENT, UNABLE TO PERFORM TEST 29* 32* 35* 22  CREATININE 2.03*  QUANTITY NOT SUFFICIENT, UNABLE TO PERFORM TEST 2.02* 1.80* 1.54* 0.90  CALCIUM  9.1 9.1 8.6* 8.5* 8.8*   Liver Function Tests: Recent Labs  Lab 11/09/23 1933 11/09/23 2140 11/10/23 0237  AST 24 21 19   ALT 10 11 10   ALKPHOS 59 51 47  BILITOT 1.7* 1.4* 1.0  PROT 7.5 7.0 6.5  ALBUMIN 3.6 3.2* 3.1*   CBG: Recent Labs  Lab 11/10/23 2118 11/11/23 0801 11/11/23 1208 11/11/23 1654 11/11/23 2150  GLUCAP 96 88 90 111* 105*    Discharge time spent: less than 30 minutes.  Signed: Dr. Sherre [facility] 11/12/2023

## 2023-11-13 ENCOUNTER — Other Ambulatory Visit (INDEPENDENT_AMBULATORY_CARE_PROVIDER_SITE_OTHER)

## 2023-11-13 ENCOUNTER — Other Ambulatory Visit: Payer: Self-pay

## 2023-11-13 ENCOUNTER — Ambulatory Visit: Payer: Self-pay | Admitting: Family Medicine

## 2023-11-13 ENCOUNTER — Other Ambulatory Visit (HOSPITAL_COMMUNITY): Payer: Self-pay

## 2023-11-13 DIAGNOSIS — E1149 Type 2 diabetes mellitus with other diabetic neurological complication: Secondary | ICD-10-CM

## 2023-11-13 LAB — LIPID PANEL
Cholesterol: 156 mg/dL (ref 0–200)
HDL: 45.4 mg/dL (ref >39.00)
LDL Cholesterol: 88 mg/dL (ref 0–99)
NonHDL: 110.71
Total CHOL/HDL Ratio: 3
Triglycerides: 112 mg/dL (ref 0.0–149.0)
VLDL: 22.4 mg/dL (ref 0.0–40.0)

## 2023-11-13 LAB — HEMOGLOBIN A1C: Hgb A1c MFr Bld: 5.9 % (ref 4.6–6.5)

## 2023-11-13 NOTE — Progress Notes (Signed)
 No critical labs need to be addressed urgently. We will discuss labs in detail at upcoming office visit.

## 2023-11-14 ENCOUNTER — Other Ambulatory Visit: Payer: Self-pay | Admitting: Family Medicine

## 2023-11-16 ENCOUNTER — Other Ambulatory Visit: Payer: Self-pay

## 2023-11-20 ENCOUNTER — Other Ambulatory Visit: Payer: Self-pay

## 2023-11-20 ENCOUNTER — Telehealth: Payer: Self-pay

## 2023-11-20 ENCOUNTER — Ambulatory Visit: Admitting: Family Medicine

## 2023-11-20 ENCOUNTER — Other Ambulatory Visit (HOSPITAL_COMMUNITY): Payer: Self-pay

## 2023-11-20 ENCOUNTER — Encounter: Payer: Self-pay | Admitting: Family Medicine

## 2023-11-20 VITALS — BP 120/76 | HR 82 | Temp 97.8°F | Ht 60.43 in | Wt 159.4 lb

## 2023-11-20 DIAGNOSIS — E1159 Type 2 diabetes mellitus with other circulatory complications: Secondary | ICD-10-CM

## 2023-11-20 DIAGNOSIS — N179 Acute kidney failure, unspecified: Secondary | ICD-10-CM | POA: Diagnosis not present

## 2023-11-20 DIAGNOSIS — E1149 Type 2 diabetes mellitus with other diabetic neurological complication: Secondary | ICD-10-CM | POA: Diagnosis not present

## 2023-11-20 DIAGNOSIS — Z7985 Long-term (current) use of injectable non-insulin antidiabetic drugs: Secondary | ICD-10-CM | POA: Diagnosis not present

## 2023-11-20 DIAGNOSIS — N10 Acute tubulo-interstitial nephritis: Secondary | ICD-10-CM

## 2023-11-20 DIAGNOSIS — I152 Hypertension secondary to endocrine disorders: Secondary | ICD-10-CM

## 2023-11-20 DIAGNOSIS — E785 Hyperlipidemia, unspecified: Secondary | ICD-10-CM

## 2023-11-20 DIAGNOSIS — E1169 Type 2 diabetes mellitus with other specified complication: Secondary | ICD-10-CM | POA: Diagnosis not present

## 2023-11-20 DIAGNOSIS — E13319 Other specified diabetes mellitus with unspecified diabetic retinopathy without macular edema: Secondary | ICD-10-CM

## 2023-11-20 LAB — HM DIABETES FOOT EXAM

## 2023-11-20 MED ORDER — SEMAGLUTIDE (1 MG/DOSE) 4 MG/3ML ~~LOC~~ SOPN
1.0000 mg | PEN_INJECTOR | SUBCUTANEOUS | 3 refills | Status: AC
  Filled 2023-11-20: qty 3, 28d supply, fill #0
  Filled 2023-11-20: qty 9, 84d supply, fill #0

## 2023-11-20 NOTE — Assessment & Plan Note (Signed)
 Symptoms resolved

## 2023-11-20 NOTE — Assessment & Plan Note (Signed)
 Chronic, well controlled despite being off  crestor . She wishes to hold for now.

## 2023-11-20 NOTE — Progress Notes (Signed)
 Patient ID: Patricia Horne, female    DOB: 12/16/1950, 73 y.o.   MRN: [id number]  This visit was conducted in person.  BP 120/76   Pulse 82   Temp 97.8 F (36.6 C) (Temporal)   Ht 5' 0.43 (1.535 m)   Wt 159 lb 6 oz (72.3 kg)   SpO2 97%   BMI 30.68 kg/m    CC:  Chief Complaint  Patient presents with   Diabetes   Back Pain    Seen in ED 11/09/2023    Subjective:   HPI: Patricia Horne is a 73 y.o. female presenting on 11/20/2023 for Diabetes and Back Pain (Seen in ED 11/09/2023)   Hypertension:   well controlled  on   amlodipine /olmesartan   10/40 mg daily BP Readings from Last 3 Encounters:  11/20/23 120/76  11/12/23 (!) 157/75  10/26/23 (!) 150/76  Using medication without problems or lightheadedness:  none Chest pain with exertion:none Edema: stable Short of breath:none Average home BPs:  not checking.  Diabetes:  well controlled on  semaglutide  1 mg weekly. Lab Results  Component Value Date   HGBA1C 5.9 11/13/2023  Using medications without difficulties: Hypoglycemic episodes:  no recent lows Hyperglycemic episodes:  yes Feet problems: no ulcer Blood Sugars averaging:     not checking or wearing CGM lately Semaglutide  1 mg  weekly  Wt Readings from Last 3 Encounters:  11/20/23 159 lb 6 oz (72.3 kg)  11/10/23 153 lb 10.6 oz (69.7 kg)  10/26/23 159 lb 4 oz (72.2 kg)   High cholesterol  LDL now   at goal.. she is no longer taking crestor ..  stopped after holding with COVID. Never restarted  She continues to have low back pain.. worse with standing better with sitting and  walking ongoing for years, but worse in last few months.  No new fall.  No radiation to legs.  No numbness, no weakness in legs.  In past she has tried PT home and formal.  Has tried and failed lidocaine  patches in past. Tylenol  in past no thelpful.  X-ray in 2022 showed degenerative disc disease and arthritis, no fractures  Repeat eval at last OV 07/2023   Xray:  IMPRESSION: Multilevel lumbar degenerative changes as above. No acute finding by plain radiography.   Started on meloxicam  15 mg daily.  Right now she says low back pain is okay.  She  is not interested in referral for back pain.   Recent hospital visit for pyelonephritis 10/10 Noted to have decreased kidney function in association with vomiting, Mobic  held. Treated with ceftriaxone 2 g IV x 3 days. At discharge: Creatinine back to baseline 0.90.  No urinary symptoms.  No back to normal po intake.    Relevant past medical, surgical, family and social history reviewed and updated as indicated. Interim medical history since our last visit reviewed. Allergies and medications reviewed and updated. Outpatient Medications Prior to Visit  Medication Sig Dispense Refill   acetaminophen  (TYLENOL ) 500 MG tablet Take 500 mg by mouth every 6 (six) hours as needed.     amLODipine -olmesartan  (AZOR ) 10-40 MG tablet Take 1 tablet by mouth daily. 30 tablet 11   Blood Glucose Monitoring Suppl (ONE TOUCH ULTRA 2) w/Device KIT Use to check blood sugar once daily 1 kit 0   Continuous Glucose Sensor (FREESTYLE LIBRE 3 SENSOR) MISC Place 1 sensor on the skin every 14 days. Use to check glucose continuously 2 each 11   glucose blood (ONETOUCH ULTRA TEST)  test strip Use to check blood sugar once daily 100 each 3   Lancets (ONETOUCH ULTRASOFT) lancets Use to check blood sugar once daily 100 each 3   Lancets Misc. (ACCU-CHEK SOFTCLIX LANCET DEV) KIT Use to check blood sugar once dialy 1 kit 0   linaclotide  (LINZESS ) 72 MCG capsule Take 1 capsule (72 mcg total) by mouth daily. 30 capsule 3   omeprazole  (PRILOSEC) 20 MG capsule Take 1 capsule (20 mg total) by mouth daily. 30 capsule 3   ondansetron  (ZOFRAN ) 4 MG tablet Take 1 tablet (4 mg total) by mouth every 8 (eight) hours as needed for nausea or vomiting. 20 tablet 0   traZODone  (DESYREL ) 50 MG tablet Take 0.5-1 tablets (25-50 mg total) by mouth at bedtime as  needed for sleep. 30 tablet 3   Semaglutide , 1 MG/DOSE, 4 MG/3ML SOPN Inject 1 mg as directed once a week. 9 mL 1   rosuvastatin  (CRESTOR ) 5 MG tablet Take 1 tablet (5 mg total) by mouth every Monday, Wednesday, and Friday. (Patient not taking: Reported on 11/20/2023) 30 tablet 11   No facility-administered medications prior to visit.     Per HPI unless specifically indicated in ROS section below Review of Systems  Constitutional:  Negative for chills, fatigue and fever.  HENT:  Negative for congestion and ear pain.   Eyes:  Negative for pain and redness.  Respiratory:  Negative for cough and shortness of breath.   Cardiovascular:  Negative for chest pain, palpitations and leg swelling.  Gastrointestinal:  Negative for abdominal pain, blood in stool, constipation, diarrhea, nausea and vomiting.  Genitourinary:  Negative for dysuria and vaginal bleeding.  Musculoskeletal:  Negative for back pain and myalgias.  Skin:  Negative for rash.  Neurological:  Negative for dizziness, syncope, light-headedness and headaches.  Psychiatric/Behavioral:  Negative for dysphoric mood. The patient is not nervous/anxious.    Objective:  BP 120/76   Pulse 82   Temp 97.8 F (36.6 C) (Temporal)   Ht 5' 0.43 (1.535 m)   Wt 159 lb 6 oz (72.3 kg)   SpO2 97%   BMI 30.68 kg/m   Wt Readings from Last 3 Encounters:  11/20/23 159 lb 6 oz (72.3 kg)  11/10/23 153 lb 10.6 oz (69.7 kg)  10/26/23 159 lb 4 oz (72.2 kg)      Physical Exam Constitutional:      General: She is not in acute distress.    Appearance: Normal appearance. She is well-developed. She is not ill-appearing or toxic-appearing.  HENT:     Head: Normocephalic.     Right Ear: Hearing, tympanic membrane, ear canal and external ear normal. Tympanic membrane is not erythematous, retracted or bulging.     Left Ear: Hearing, tympanic membrane, ear canal and external ear normal. Tympanic membrane is not erythematous, retracted or bulging.      Nose: No mucosal edema or rhinorrhea.     Right Sinus: No maxillary sinus tenderness or frontal sinus tenderness.     Left Sinus: No maxillary sinus tenderness or frontal sinus tenderness.     Mouth/Throat:     Pharynx: Uvula midline.  Eyes:     General: Lids are normal. Lids are everted, no foreign bodies appreciated.     Conjunctiva/sclera: Conjunctivae normal.     Pupils: Pupils are equal, round, and reactive to light.  Neck:     Thyroid : No thyroid  mass or thyromegaly.     Vascular: No carotid bruit.     Trachea: Trachea normal.  Cardiovascular:     Rate and Rhythm: Normal rate and regular rhythm.     Pulses: Normal pulses.     Heart sounds: Normal heart sounds, S1 normal and S2 normal. No murmur heard.    No friction rub. No gallop.  Pulmonary:     Effort: Pulmonary effort is normal. No tachypnea or respiratory distress.     Breath sounds: Normal breath sounds. No decreased breath sounds, wheezing, rhonchi or rales.  Abdominal:     General: Bowel sounds are normal.     Palpations: Abdomen is soft.     Tenderness: There is no abdominal tenderness.  Musculoskeletal:     Cervical back: Normal range of motion and neck supple.     Lumbar back: Tenderness present. No spasms or bony tenderness. Decreased range of motion. Negative right straight leg raise test and negative left straight leg raise test.  Skin:    General: Skin is warm and dry.     Findings: No rash.  Neurological:     Mental Status: She is alert.  Psychiatric:        Mood and Affect: Mood is not anxious or depressed.        Speech: Speech normal.        Behavior: Behavior normal. Behavior is cooperative.        Thought Content: Thought content normal.        Judgment: Judgment normal.    Diabetic foot exam: Normal inspection No skin breakdown No calluses  Normal DP pulses Normal sensation to light touch and monofilament Nails normal      Results for orders placed or performed in visit on 11/13/23   Lipid panel   Collection Time: 11/13/23  9:13 AM  Result Value Ref Range   Cholesterol 156 0 - 200 mg/dL   Triglycerides 887.9 0.0 - 149.0 mg/dL   HDL 54.59 >60.99 mg/dL   VLDL 77.5 0.0 - 59.9 mg/dL   LDL Cholesterol 88 0 - 99 mg/dL   Total CHOL/HDL Ratio 3    NonHDL 110.71   Hemoglobin A1c   Collection Time: 11/13/23  9:13 AM  Result Value Ref Range   Hgb A1c MFr Bld 5.9 4.6 - 6.5 %   Assessment and Plan:  Hypertension associated with diabetes (HCC) Assessment & Plan: Stable, good control back on her medication.  Chronic.  Continue current medication.  amlodipine /olmesartan   10/40 mg daily     Type 2 diabetes mellitus with neurological complications [facility]) Assessment & Plan: Chronic,  well controlled diabetes Encouraged patient to keep up with hydration while on GLP-1 medication. Semaglutide  1 mg weekly  Orders: -     Hemoglobin A1c; Future -     Lipid panel; Future -     Comprehensive metabolic panel with GFR; Future  Diabetic retinopathy associated with diabetes mellitus of other type, macular edema presence unspecified, unspecified laterality, unspecified retinopathy severity (HCC) Assessment & Plan:  Followed by opthamology   Hyperlipidemia associated with type 2 diabetes mellitus (HCC) Assessment & Plan:  Chronic, well controlled despite being off  crestor . She wishes to hold for now.   AKI (acute kidney injury) Assessment & Plan:  Acute, likely prerenal... due to  vomiting... also possible dehydration with GLP1.  Worsened by NSAID.SABRA. encouraged pt to use tylenol  for pain, will avoid Mobic .   Acute pyelonephritis Assessment & Plan:  Symptoms resolved.    Long-term current use of injectable noninsulin antidiabetic medication  Other orders -     Semaglutide  (1 MG/DOSE);  Inject 1 mg as directed once a week.  Dispense: 9 mL; Refill: 3

## 2023-11-20 NOTE — Assessment & Plan Note (Signed)
Followed by opthamology.

## 2023-11-20 NOTE — Assessment & Plan Note (Addendum)
 Acute, likely prerenal... due to  vomiting... also possible dehydration with GLP1.  Worsened by NSAID.SABRA. encouraged pt to use tylenol  for pain, will avoid Mobic .

## 2023-11-20 NOTE — Telephone Encounter (Signed)
 Pharmacy Patient Advocate Encounter   Received notification from CoverMyMeds that prior authorization for Ozempic  (1 MG/DOSE) 4MG /3ML pen-injectors  is required/requested.   Insurance verification completed.   The patient is insured through [employer].   Per test claim: PA required; PA submitted to above mentioned insurance via Latent Key/confirmation #/EOC [id number] Status is pending

## 2023-11-20 NOTE — Assessment & Plan Note (Signed)
 Stable, good control back on her medication.  Chronic.  Continue current medication.  amlodipine /olmesartan   10/40 mg daily

## 2023-11-20 NOTE — Assessment & Plan Note (Addendum)
 Chronic,  well controlled diabetes Encouraged patient to keep up with hydration while on GLP-1 medication. Semaglutide  1 mg weekly

## 2023-11-21 NOTE — Telephone Encounter (Signed)
 Pharmacy Patient Advocate Encounter  Received notification from [employer] that Prior Authorization for Ozempic  (1 MG/DOSE) 4MG /3ML pen-injectors  has been APPROVED from 11/21/2023 to 11/21/2023   PA #/Case ID/Reference #: [id number]

## 2023-11-21 NOTE — Telephone Encounter (Signed)
 Called patient let know approved. She will finish what she has on hand from patient assistance then will pick up.

## 2023-11-23 ENCOUNTER — Other Ambulatory Visit (HOSPITAL_COMMUNITY): Payer: Self-pay

## 2023-11-23 DIAGNOSIS — E113511 Type 2 diabetes mellitus with proliferative diabetic retinopathy with macular edema, right eye: Secondary | ICD-10-CM | POA: Diagnosis not present

## 2023-11-26 ENCOUNTER — Other Ambulatory Visit (HOSPITAL_BASED_OUTPATIENT_CLINIC_OR_DEPARTMENT_OTHER): Payer: Self-pay

## 2023-11-29 ENCOUNTER — Other Ambulatory Visit: Payer: Self-pay

## 2023-11-30 ENCOUNTER — Other Ambulatory Visit (HOSPITAL_BASED_OUTPATIENT_CLINIC_OR_DEPARTMENT_OTHER): Payer: Self-pay

## 2023-11-30 ENCOUNTER — Other Ambulatory Visit (HOSPITAL_COMMUNITY): Payer: Self-pay

## 2023-11-30 ENCOUNTER — Other Ambulatory Visit: Payer: Self-pay

## 2023-12-06 ENCOUNTER — Other Ambulatory Visit: Payer: Self-pay

## 2023-12-06 DIAGNOSIS — M19071 Primary osteoarthritis, right ankle and foot: Secondary | ICD-10-CM | POA: Diagnosis not present

## 2023-12-06 DIAGNOSIS — E113552 Type 2 diabetes mellitus with stable proliferative diabetic retinopathy, left eye: Secondary | ICD-10-CM | POA: Diagnosis not present

## 2023-12-06 DIAGNOSIS — M25571 Pain in right ankle and joints of right foot: Secondary | ICD-10-CM | POA: Diagnosis not present

## 2023-12-06 DIAGNOSIS — Z961 Presence of intraocular lens: Secondary | ICD-10-CM | POA: Diagnosis not present

## 2023-12-06 DIAGNOSIS — E113511 Type 2 diabetes mellitus with proliferative diabetic retinopathy with macular edema, right eye: Secondary | ICD-10-CM | POA: Diagnosis not present

## 2023-12-06 DIAGNOSIS — E1142 Type 2 diabetes mellitus with diabetic polyneuropathy: Secondary | ICD-10-CM | POA: Diagnosis not present

## 2023-12-06 DIAGNOSIS — H2000 Unspecified acute and subacute iridocyclitis: Secondary | ICD-10-CM | POA: Diagnosis not present

## 2023-12-06 MED ORDER — PREDNISOLONE ACETATE 1 % OP SUSP
1.0000 [drp] | Freq: Four times a day (QID) | OPHTHALMIC | 0 refills | Status: AC
  Filled 2023-12-06: qty 5, 25d supply, fill #0

## 2023-12-06 MED ORDER — DICLOFENAC SODIUM ER 100 MG PO TB24
100.0000 mg | ORAL_TABLET | Freq: Every day | ORAL | 0 refills | Status: AC
  Filled 2023-12-06: qty 14, 14d supply, fill #0

## 2023-12-07 ENCOUNTER — Other Ambulatory Visit: Payer: Self-pay

## 2023-12-10 ENCOUNTER — Other Ambulatory Visit (HOSPITAL_BASED_OUTPATIENT_CLINIC_OR_DEPARTMENT_OTHER): Payer: Self-pay

## 2023-12-10 DIAGNOSIS — H2000 Unspecified acute and subacute iridocyclitis: Secondary | ICD-10-CM | POA: Diagnosis not present

## 2023-12-10 DIAGNOSIS — E113511 Type 2 diabetes mellitus with proliferative diabetic retinopathy with macular edema, right eye: Secondary | ICD-10-CM | POA: Diagnosis not present

## 2023-12-10 DIAGNOSIS — Z961 Presence of intraocular lens: Secondary | ICD-10-CM | POA: Diagnosis not present

## 2023-12-10 DIAGNOSIS — E113552 Type 2 diabetes mellitus with stable proliferative diabetic retinopathy, left eye: Secondary | ICD-10-CM | POA: Diagnosis not present

## 2023-12-12 ENCOUNTER — Other Ambulatory Visit (HOSPITAL_COMMUNITY): Payer: Self-pay

## 2023-12-18 ENCOUNTER — Other Ambulatory Visit (HOSPITAL_COMMUNITY): Payer: Self-pay

## 2023-12-28 ENCOUNTER — Other Ambulatory Visit (HOSPITAL_COMMUNITY): Payer: Self-pay

## 2023-12-31 DIAGNOSIS — E113511 Type 2 diabetes mellitus with proliferative diabetic retinopathy with macular edema, right eye: Secondary | ICD-10-CM | POA: Diagnosis not present

## 2024-01-01 DIAGNOSIS — E1142 Type 2 diabetes mellitus with diabetic polyneuropathy: Secondary | ICD-10-CM | POA: Diagnosis not present

## 2024-01-01 DIAGNOSIS — M19071 Primary osteoarthritis, right ankle and foot: Secondary | ICD-10-CM | POA: Diagnosis not present

## 2024-02-13 ENCOUNTER — Other Ambulatory Visit

## 2024-02-13 ENCOUNTER — Ambulatory Visit: Payer: Self-pay | Admitting: Family Medicine

## 2024-02-13 DIAGNOSIS — E1149 Type 2 diabetes mellitus with other diabetic neurological complication: Secondary | ICD-10-CM

## 2024-02-13 LAB — COMPREHENSIVE METABOLIC PANEL WITH GFR
ALT: 10 U/L (ref 3–35)
AST: 15 U/L (ref 5–37)
Albumin: 3.6 g/dL (ref 3.5–5.2)
Alkaline Phosphatase: 69 U/L (ref 39–117)
BUN: 15 mg/dL (ref 6–23)
CO2: 30 meq/L (ref 19–32)
Calcium: 8.8 mg/dL (ref 8.4–10.5)
Chloride: 105 meq/L (ref 96–112)
Creatinine, Ser: 0.97 mg/dL (ref 0.40–1.20)
GFR: 57.99 mL/min — ABNORMAL LOW
Glucose, Bld: 89 mg/dL (ref 70–99)
Potassium: 3.6 meq/L (ref 3.5–5.1)
Sodium: 140 meq/L (ref 135–145)
Total Bilirubin: 0.7 mg/dL (ref 0.2–1.2)
Total Protein: 7 g/dL (ref 6.0–8.3)

## 2024-02-13 LAB — LIPID PANEL
Cholesterol: 169 mg/dL (ref 28–200)
HDL: 50.8 mg/dL
LDL Cholesterol: 106 mg/dL — ABNORMAL HIGH (ref 10–99)
NonHDL: 118.58
Total CHOL/HDL Ratio: 3
Triglycerides: 63 mg/dL (ref 10.0–149.0)
VLDL: 12.6 mg/dL (ref 0.0–40.0)

## 2024-02-13 LAB — HEMOGLOBIN A1C: Hgb A1c MFr Bld: 5.6 % (ref 4.6–6.5)

## 2024-02-13 NOTE — Progress Notes (Signed)
 No critical labs need to be addressed urgently. We will discuss labs in detail at upcoming office visit.

## 2024-02-20 ENCOUNTER — Encounter: Payer: Self-pay | Admitting: Family Medicine

## 2024-02-20 ENCOUNTER — Ambulatory Visit: Admitting: Family Medicine

## 2024-02-20 VITALS — BP 136/82 | HR 87 | Temp 98.5°F | Ht 60.43 in | Wt 154.2 lb

## 2024-02-20 DIAGNOSIS — E1149 Type 2 diabetes mellitus with other diabetic neurological complication: Secondary | ICD-10-CM

## 2024-02-20 DIAGNOSIS — S3992XA Unspecified injury of lower back, initial encounter: Secondary | ICD-10-CM

## 2024-02-20 DIAGNOSIS — I152 Hypertension secondary to endocrine disorders: Secondary | ICD-10-CM

## 2024-02-20 DIAGNOSIS — E663 Overweight: Secondary | ICD-10-CM | POA: Diagnosis not present

## 2024-02-20 DIAGNOSIS — E785 Hyperlipidemia, unspecified: Secondary | ICD-10-CM | POA: Diagnosis not present

## 2024-02-20 DIAGNOSIS — E1169 Type 2 diabetes mellitus with other specified complication: Secondary | ICD-10-CM

## 2024-02-20 DIAGNOSIS — E1159 Type 2 diabetes mellitus with other circulatory complications: Secondary | ICD-10-CM | POA: Diagnosis not present

## 2024-02-20 DIAGNOSIS — E13319 Other specified diabetes mellitus with unspecified diabetic retinopathy without macular edema: Secondary | ICD-10-CM

## 2024-02-20 NOTE — Assessment & Plan Note (Signed)
"   Weight loss on GLP1 medication. "

## 2024-02-20 NOTE — Progress Notes (Signed)
 "   Patient ID: Patricia Horne, female    DOB: 05-05-1950, 74 y.o.   MRN: [id number]  This visit was conducted in person.  BP 136/82   Pulse 87   Temp 98.5 F (36.9 C) (Oral)   Ht 5' 0.43 (1.535 m)   Wt 154 lb 4 oz (70 kg)   SpO2 99%   BMI 29.70 kg/m    CC:  Chief Complaint  Patient presents with   Medical Management of Chronic Issues    Pt here for DM f/u    Subjective:   HPI: Patricia Horne is a 74 y.o. female presenting on 02/20/2024 for Medical Management of Chronic Issues (Pt here for DM f/u)   Hypertension:   well controlled  on   amlodipine /olmesartan   10/40 mg daily BP Readings from Last 3 Encounters:  02/20/24 136/82  11/20/23 120/76  11/12/23 (!) 157/75  Using medication without problems or lightheadedness:  none Chest pain with exertion:none Edema: stable Short of breath:none Average home BPs:  not checking.  Diabetes:  well controlled on  semaglutide  1 mg weekly... she states  Lab Results  Component Value Date   HGBA1C 5.6 02/13/2024  Using medications without difficulties: Hypoglycemic episodes:  no recent lows Hyperglycemic episodes:  yes Feet problems: no ulcer Blood Sugars averaging:     not checking or wearing CGM lately Semaglutide  1 mg  weekly  Body mass index is 29.7 kg/m.  Wt Readings from Last 3 Encounters:  02/20/24 154 lb 4 oz (70 kg)  11/20/23 159 lb 6 oz (72.3 kg)  11/10/23 153 lb 10.6 oz (69.7 kg)   High cholesterol  LDL no longer  at goal.. she is no longer taking crestor ..  stopped after holding with COVID. Never restarted Lab Results  Component Value Date   CHOL 169 02/13/2024   HDL 50.80 02/13/2024   LDLCALC 106 (H) 02/13/2024   TRIG 63.0 02/13/2024   CHOLHDL 3 02/13/2024    Renal function better after resolution of ARF/ pyelonephritis. Back at baseline.   Relevant past medical, surgical, family and social history reviewed and updated as indicated. Interim medical history since our last visit  reviewed. Allergies and medications reviewed and updated. Outpatient Medications Prior to Visit  Medication Sig Dispense Refill   acetaminophen  (TYLENOL ) 500 MG tablet Take 500 mg by mouth every 6 (six) hours as needed.     amLODipine -olmesartan  (AZOR ) 10-40 MG tablet Take 1 tablet by mouth daily. 30 tablet 11   Blood Glucose Monitoring Suppl (ONE TOUCH ULTRA 2) w/Device KIT Use to check blood sugar once daily 1 kit 0   Continuous Glucose Sensor (FREESTYLE LIBRE 3 SENSOR) MISC Place 1 sensor on the skin every 14 days. Use to check glucose continuously 2 each 11   glucose blood (ONETOUCH ULTRA TEST) test strip Use to check blood sugar once daily 100 each 3   Lancets (ONETOUCH ULTRASOFT) lancets Use to check blood sugar once daily 100 each 3   Lancets Misc. (ACCU-CHEK SOFTCLIX LANCET DEV) KIT Use to check blood sugar once dialy 1 kit 0   linaclotide  (LINZESS ) 72 MCG capsule Take 1 capsule (72 mcg total) by mouth daily. 30 capsule 3   omeprazole  (PRILOSEC) 20 MG capsule Take 1 capsule (20 mg total) by mouth daily. 30 capsule 3   ondansetron  (ZOFRAN ) 4 MG tablet Take 1 tablet (4 mg total) by mouth every 8 (eight) hours as needed for nausea or vomiting. 20 tablet 0   rosuvastatin  (CRESTOR ) 5 MG  tablet Take 1 tablet (5 mg total) by mouth every Monday, Wednesday, and Friday. 30 tablet 11   Semaglutide , 1 MG/DOSE, 4 MG/3ML SOPN Inject 1 mg into the skin once a week. 9 mL 3   traZODone  (DESYREL ) 50 MG tablet Take 0.5-1 tablets (25-50 mg total) by mouth at bedtime as needed for sleep. 30 tablet 3   prednisoLONE  acetate (PRED FORTE ) 1 % ophthalmic suspension Place 1 drop into the right eye 4 (four) times daily. (Patient not taking: Reported on 02/20/2024) 5 mL 0   No facility-administered medications prior to visit.     Per HPI unless specifically indicated in ROS section below Review of Systems  Constitutional:  Negative for chills, fatigue and fever.  HENT:  Negative for congestion and ear pain.    Eyes:  Negative for pain and redness.  Respiratory:  Negative for cough and shortness of breath.   Cardiovascular:  Negative for chest pain, palpitations and leg swelling.  Gastrointestinal:  Negative for abdominal pain, blood in stool, constipation, diarrhea, nausea and vomiting.  Genitourinary:  Negative for dysuria and vaginal bleeding.  Musculoskeletal:  Negative for back pain and myalgias.  Skin:  Negative for rash.  Neurological:  Negative for dizziness, syncope, light-headedness and headaches.  Psychiatric/Behavioral:  Negative for dysphoric mood. The patient is not nervous/anxious.    Objective:  BP 136/82   Pulse 87   Temp 98.5 F (36.9 C) (Oral)   Ht 5' 0.43 (1.535 m)   Wt 154 lb 4 oz (70 kg)   SpO2 99%   BMI 29.70 kg/m   Wt Readings from Last 3 Encounters:  02/20/24 154 lb 4 oz (70 kg)  11/20/23 159 lb 6 oz (72.3 kg)  11/10/23 153 lb 10.6 oz (69.7 kg)      Physical Exam Constitutional:      General: She is not in acute distress.    Appearance: Normal appearance. She is well-developed. She is not ill-appearing or toxic-appearing.  HENT:     Head: Normocephalic.     Right Ear: Hearing, tympanic membrane, ear canal and external ear normal. Tympanic membrane is not erythematous, retracted or bulging.     Left Ear: Hearing, tympanic membrane, ear canal and external ear normal. Tympanic membrane is not erythematous, retracted or bulging.     Nose: No mucosal edema or rhinorrhea.     Right Sinus: No maxillary sinus tenderness or frontal sinus tenderness.     Left Sinus: No maxillary sinus tenderness or frontal sinus tenderness.     Mouth/Throat:     Pharynx: Uvula midline.  Eyes:     General: Lids are normal. Lids are everted, no foreign bodies appreciated.     Conjunctiva/sclera: Conjunctivae normal.     Pupils: Pupils are equal, round, and reactive to light.  Neck:     Thyroid : No thyroid  mass or thyromegaly.     Vascular: No carotid bruit.     Trachea: Trachea  normal.  Cardiovascular:     Rate and Rhythm: Normal rate and regular rhythm.     Pulses: Normal pulses.     Heart sounds: Normal heart sounds, S1 normal and S2 normal. No murmur heard.    No friction rub. No gallop.  Pulmonary:     Effort: Pulmonary effort is normal. No tachypnea or respiratory distress.     Breath sounds: Normal breath sounds. No decreased breath sounds, wheezing, rhonchi or rales.  Abdominal:     General: Bowel sounds are normal.  Palpations: Abdomen is soft.     Tenderness: There is no abdominal tenderness.  Musculoskeletal:     Cervical back: Normal range of motion and neck supple.     Lumbar back: Tenderness present. No spasms or bony tenderness. Decreased range of motion. Negative right straight leg raise test and negative left straight leg raise test.  Skin:    General: Skin is warm and dry.     Findings: No rash.  Neurological:     Mental Status: She is alert.  Psychiatric:        Mood and Affect: Mood is not anxious or depressed.        Speech: Speech normal.        Behavior: Behavior normal. Behavior is cooperative.        Thought Content: Thought content normal.        Judgment: Judgment normal.    Diabetic foot exam: Normal inspection No skin breakdown No calluses  Normal DP pulses Normal sensation to light touch and monofilament Nails normal      Results for orders placed or performed in visit on 02/13/24  Comprehensive metabolic panel with GFR   Collection Time: 02/13/24  8:44 AM  Result Value Ref Range   Sodium 140 135 - 145 mEq/L   Potassium 3.6 3.5 - 5.1 mEq/L   Chloride 105 96 - 112 mEq/L   CO2 30 19 - 32 mEq/L   Glucose, Bld 89 70 - 99 mg/dL   BUN 15 6 - 23 mg/dL   Creatinine, Ser 9.02 0.40 - 1.20 mg/dL   Total Bilirubin 0.7 0.2 - 1.2 mg/dL   Alkaline Phosphatase 69 39 - 117 U/L   AST 15 5 - 37 U/L   ALT 10 3 - 35 U/L   Total Protein 7.0 6.0 - 8.3 g/dL   Albumin 3.6 3.5 - 5.2 g/dL   GFR 42.00 (L) >39.99 mL/min   Calcium   8.8 8.4 - 10.5 mg/dL  Lipid panel   Collection Time: 02/13/24  8:44 AM  Result Value Ref Range   Cholesterol 169 28 - 200 mg/dL   Triglycerides 36.9 89.9 - 149.0 mg/dL   HDL 49.19 >60.99 mg/dL   VLDL 87.3 0.0 - 59.9 mg/dL   LDL Cholesterol 893 (H) 10 - 99 mg/dL   Total CHOL/HDL Ratio 3    NonHDL 118.58   Hemoglobin A1c   Collection Time: 02/13/24  8:44 AM  Result Value Ref Range   Hgb A1c MFr Bld 5.6 4.6 - 6.5 %   Assessment and Plan:  Hypertension associated with diabetes (HCC) Assessment & Plan: Stable, good control back on her medication.  Chronic.  Continue current medication.  amlodipine /olmesartan   10/40 mg daily     Diabetic retinopathy associated with diabetes mellitus of other type, macular edema presence unspecified, unspecified laterality, unspecified retinopathy severity (HCC) Assessment & Plan:  Followed by opthamology   Hyperlipidemia associated with type 2 diabetes mellitus (HCC) Assessment & Plan: Chronic, now inadequate control    She will restart crestor  5 mg three days a week.   Type 2 diabetes mellitus with neurological complications [facility]) Assessment & Plan: Chronic,  well controlled diabetes Encouraged patient to keep up with hydration while on GLP-1 medication. Semaglutide  1 mg weekly  Orders: -     Hemoglobin A1c; Future -     Lipid panel; Future -     Comprehensive metabolic panel with GFR; Future -     Microalbumin / creatinine urine ratio; Future  Overweight (BMI  25.0-29.9) Assessment & Plan:  Weight loss on GLP1 medication.   Tailbone injury, initial encounter       "

## 2024-02-20 NOTE — Assessment & Plan Note (Signed)
Followed by opthamology.

## 2024-02-20 NOTE — Assessment & Plan Note (Signed)
 Chronic,  well controlled diabetes Encouraged patient to keep up with hydration while on GLP-1 medication. Semaglutide  1 mg weekly

## 2024-02-20 NOTE — Assessment & Plan Note (Signed)
 Stable, good control back on her medication.  Chronic.  Continue current medication.  amlodipine /olmesartan   10/40 mg daily

## 2024-02-20 NOTE — Assessment & Plan Note (Addendum)
 Chronic, now inadequate control    She will restart crestor  5 mg three days a week.

## 2024-02-27 ENCOUNTER — Other Ambulatory Visit (HOSPITAL_COMMUNITY): Payer: Self-pay

## 2024-05-13 ENCOUNTER — Other Ambulatory Visit

## 2024-05-20 ENCOUNTER — Ambulatory Visit: Admitting: Family Medicine
